# Patient Record
Sex: Male | Born: 1960
Health system: Southern US, Community
[De-identification: ages and names within clinical notes are randomized; demographics above are authoritative.]

## PROBLEM LIST (undated history)

## (undated) DIAGNOSIS — I671 Cerebral aneurysm, nonruptured: Secondary | ICD-10-CM

## (undated) DIAGNOSIS — I639 Cerebral infarction, unspecified: Secondary | ICD-10-CM

## (undated) DIAGNOSIS — J42 Unspecified chronic bronchitis: Secondary | ICD-10-CM

## (undated) DIAGNOSIS — J439 Emphysema, unspecified: Secondary | ICD-10-CM

## (undated) DIAGNOSIS — I10 Essential (primary) hypertension: Secondary | ICD-10-CM

## (undated) DIAGNOSIS — J45909 Unspecified asthma, uncomplicated: Secondary | ICD-10-CM

## (undated) DIAGNOSIS — F4321 Adjustment disorder with depressed mood: Secondary | ICD-10-CM

## (undated) DIAGNOSIS — B159 Hepatitis A without hepatic coma: Secondary | ICD-10-CM

## (undated) DIAGNOSIS — M069 Rheumatoid arthritis, unspecified: Secondary | ICD-10-CM

## (undated) DIAGNOSIS — K219 Gastro-esophageal reflux disease without esophagitis: Secondary | ICD-10-CM

## (undated) DIAGNOSIS — J189 Pneumonia, unspecified organism: Secondary | ICD-10-CM

## (undated) DIAGNOSIS — E7800 Pure hypercholesterolemia, unspecified: Secondary | ICD-10-CM

## (undated) DIAGNOSIS — J449 Chronic obstructive pulmonary disease, unspecified: Secondary | ICD-10-CM

## (undated) HISTORY — PX: APPENDECTOMY: SHX54

## (undated) HISTORY — PX: CYSTECTOMY: SUR359

## (undated) HISTORY — DX: Emphysema, unspecified: J43.9

## (undated) HISTORY — PX: CHEST TUBE INSERTION: SHX231

## (undated) HISTORY — PX: FOOT FRACTURE SURGERY: SHX645

---

## 2004-05-01 ENCOUNTER — Encounter: Admission: RE | Admit: 2004-05-01 | Discharge: 2004-05-01 | Payer: Self-pay | Admitting: Orthopedic Surgery

## 2004-05-10 ENCOUNTER — Ambulatory Visit (HOSPITAL_BASED_OUTPATIENT_CLINIC_OR_DEPARTMENT_OTHER): Admission: RE | Admit: 2004-05-10 | Discharge: 2004-05-10 | Payer: Self-pay | Admitting: Orthopedic Surgery

## 2004-05-10 ENCOUNTER — Ambulatory Visit (HOSPITAL_COMMUNITY): Admission: RE | Admit: 2004-05-10 | Discharge: 2004-05-10 | Payer: Self-pay | Admitting: Orthopedic Surgery

## 2009-08-05 DIAGNOSIS — F4321 Adjustment disorder with depressed mood: Secondary | ICD-10-CM

## 2009-08-05 HISTORY — DX: Adjustment disorder with depressed mood: F43.21

## 2013-12-03 ENCOUNTER — Emergency Department (HOSPITAL_BASED_OUTPATIENT_CLINIC_OR_DEPARTMENT_OTHER): Payer: Self-pay

## 2013-12-03 ENCOUNTER — Encounter (HOSPITAL_BASED_OUTPATIENT_CLINIC_OR_DEPARTMENT_OTHER): Payer: Self-pay | Admitting: Emergency Medicine

## 2013-12-03 ENCOUNTER — Inpatient Hospital Stay (HOSPITAL_COMMUNITY): Payer: Self-pay

## 2013-12-03 ENCOUNTER — Inpatient Hospital Stay (HOSPITAL_BASED_OUTPATIENT_CLINIC_OR_DEPARTMENT_OTHER)
Admission: EM | Admit: 2013-12-03 | Discharge: 2013-12-08 | DRG: 191 | Disposition: A | Discharge status: Home / Self Care | Payer: Self-pay | Attending: Internal Medicine | Admitting: Internal Medicine

## 2013-12-03 DIAGNOSIS — J209 Acute bronchitis, unspecified: Principal | ICD-10-CM | POA: Diagnosis present

## 2013-12-03 DIAGNOSIS — J449 Chronic obstructive pulmonary disease, unspecified: Secondary | ICD-10-CM

## 2013-12-03 DIAGNOSIS — J440 Chronic obstructive pulmonary disease with acute lower respiratory infection: Principal | ICD-10-CM | POA: Diagnosis present

## 2013-12-03 DIAGNOSIS — R042 Hemoptysis: Secondary | ICD-10-CM | POA: Diagnosis present

## 2013-12-03 DIAGNOSIS — R911 Solitary pulmonary nodule: Secondary | ICD-10-CM | POA: Diagnosis present

## 2013-12-03 DIAGNOSIS — F17200 Nicotine dependence, unspecified, uncomplicated: Secondary | ICD-10-CM | POA: Diagnosis present

## 2013-12-03 DIAGNOSIS — I10 Essential (primary) hypertension: Secondary | ICD-10-CM | POA: Diagnosis present

## 2013-12-03 DIAGNOSIS — F1010 Alcohol abuse, uncomplicated: Secondary | ICD-10-CM | POA: Diagnosis present

## 2013-12-03 HISTORY — DX: Unspecified asthma, uncomplicated: J45.909

## 2013-12-03 HISTORY — DX: Chronic obstructive pulmonary disease, unspecified: J44.9

## 2013-12-03 HISTORY — DX: Emphysema, unspecified: J43.9

## 2013-12-03 HISTORY — DX: Rheumatoid arthritis, unspecified: M06.9

## 2013-12-03 HISTORY — DX: Essential (primary) hypertension: I10

## 2013-12-03 LAB — HEPATIC FUNCTION PANEL
ALBUMIN: 3.8 g/dL (ref 3.5–5.2)
ALK PHOS: 107 U/L (ref 39–117)
ALT: 15 U/L (ref 0–53)
AST: 20 U/L (ref 0–37)
Bilirubin, Direct: <0.2 mg/dL (ref 0.0–0.3)
TOTAL PROTEIN: 7.4 g/dL (ref 6.0–8.3)
Total Bilirubin: 0.2 mg/dL — ABNORMAL LOW (ref 0.3–1.2)

## 2013-12-03 LAB — BASIC METABOLIC PANEL
BUN: 13 mg/dL (ref 6–23)
CHLORIDE: 104 meq/L (ref 96–112)
CO2: 26 mEq/L (ref 19–32)
Calcium: 9.7 mg/dL (ref 8.4–10.5)
Creatinine, Ser: 1.2 mg/dL (ref 0.50–1.35)
GFR calc non Af Amer: 67 mL/min — ABNORMAL LOW (ref >90)
GFR, EST AFRICAN AMERICAN: 78 mL/min — AB (ref >90)
Glucose, Bld: 113 mg/dL — ABNORMAL HIGH (ref 70–99)
POTASSIUM: 4.6 meq/L (ref 3.7–5.3)
Sodium: 141 mEq/L (ref 137–147)

## 2013-12-03 LAB — CBC WITH DIFFERENTIAL/PLATELET
BASOS PCT: 1 % (ref 0–1)
Basophils Absolute: 0.1 10*3/uL (ref 0.0–0.1)
EOS ABS: 0.3 10*3/uL (ref 0.0–0.7)
EOS PCT: 3 % (ref 0–5)
HEMATOCRIT: 43.4 % (ref 39.0–52.0)
HEMOGLOBIN: 14.5 g/dL (ref 13.0–17.0)
LYMPHS ABS: 2.8 10*3/uL (ref 0.7–4.0)
Lymphocytes Relative: 26 % (ref 12–46)
MCH: 29.8 pg (ref 26.0–34.0)
MCHC: 33.4 g/dL (ref 30.0–36.0)
MCV: 89.1 fL (ref 78.0–100.0)
MONO ABS: 1.1 10*3/uL — AB (ref 0.1–1.0)
MONOS PCT: 10 % (ref 3–12)
NEUTROS ABS: 6.8 10*3/uL (ref 1.7–7.7)
Neutrophils Relative %: 62 % (ref 43–77)
PLATELETS: 318 10*3/uL (ref 150–400)
RBC: 4.87 MIL/uL (ref 4.22–5.81)
RDW: 13.5 % (ref 11.5–15.5)
WBC: 11 10*3/uL — ABNORMAL HIGH (ref 4.0–10.5)

## 2013-12-03 LAB — PROTIME-INR
INR: 0.92 (ref 0.00–1.49)
Prothrombin Time: 12.2 seconds (ref 11.6–15.2)

## 2013-12-03 LAB — APTT: APTT: 30 s (ref 24–37)

## 2013-12-03 MED ORDER — LORAZEPAM 1 MG PO TABS: 0.0000 mg | ORAL_TABLET | Freq: Four times a day (QID) | ORAL | Status: AC

## 2013-12-03 MED ORDER — SODIUM CHLORIDE 0.9 % IJ SOLN
3.0000 mL | Freq: Two times a day (BID) | INTRAMUSCULAR | Status: DC
  Administered 2013-12-04 – 2013-12-07 (×8): 3 mL via INTRAVENOUS

## 2013-12-03 MED ORDER — IPRATROPIUM-ALBUTEROL 0.5-2.5 (3) MG/3ML IN SOLN: 3.0000 mL | RESPIRATORY_TRACT | Status: DC | PRN

## 2013-12-03 MED ORDER — ALBUTEROL SULFATE (2.5 MG/3ML) 0.083% IN NEBU
2.5000 mg | INHALATION_SOLUTION | Freq: Once | RESPIRATORY_TRACT | Status: DC
  Administered 2013-12-03: 5 mg via RESPIRATORY_TRACT

## 2013-12-03 MED ORDER — FOLIC ACID 1 MG PO TABS
1.0000 mg | ORAL_TABLET | Freq: Every day | ORAL | Status: DC
  Administered 2013-12-03 – 2013-12-08 (×6): 1 mg via ORAL
  Filled 2013-12-03 (×6): qty 1

## 2013-12-03 MED ORDER — METHYLPREDNISOLONE SODIUM SUCC 125 MG IJ SOLR
125.0000 mg | Freq: Once | INTRAMUSCULAR | Status: AC
  Administered 2013-12-03: 125 mg via INTRAVENOUS
  Filled 2013-12-03: qty 2

## 2013-12-03 MED ORDER — AMLODIPINE BESYLATE 5 MG PO TABS
5.0000 mg | ORAL_TABLET | Freq: Every day | ORAL | Status: DC
  Administered 2013-12-03: 5 mg via ORAL
  Filled 2013-12-03 (×2): qty 1

## 2013-12-03 MED ORDER — IPRATROPIUM BROMIDE 0.02 % IN SOLN
RESPIRATORY_TRACT | Status: AC
  Administered 2013-12-03: 0.5 mg via RESPIRATORY_TRACT
  Filled 2013-12-03: qty 2.5

## 2013-12-03 MED ORDER — ALBUTEROL SULFATE (2.5 MG/3ML) 0.083% IN NEBU
5.0000 mg | INHALATION_SOLUTION | RESPIRATORY_TRACT | Status: AC | PRN
Start: 2013-12-03 — End: 2013-12-03
  Administered 2013-12-03: 5 mg via RESPIRATORY_TRACT
  Filled 2013-12-03: qty 6

## 2013-12-03 MED ORDER — VITAMIN B-1 100 MG PO TABS
100.0000 mg | ORAL_TABLET | Freq: Every day | ORAL | Status: DC
Start: 2013-12-03 — End: 2013-12-08
  Administered 2013-12-03 – 2013-12-08 (×6): 100 mg via ORAL
  Filled 2013-12-03 (×6): qty 1

## 2013-12-03 MED ORDER — MORPHINE SULFATE 2 MG/ML IJ SOLN: 2.0000 mg | INTRAMUSCULAR | Status: DC | PRN

## 2013-12-03 MED ORDER — DEXTROSE 5 % IV SOLN
1.0000 g | Freq: Once | INTRAVENOUS | Status: AC
  Administered 2013-12-03: 1 g via INTRAVENOUS

## 2013-12-03 MED ORDER — LORAZEPAM 1 MG PO TABS: 1.0000 mg | ORAL_TABLET | Freq: Four times a day (QID) | ORAL | Status: AC | PRN

## 2013-12-03 MED ORDER — LORAZEPAM 2 MG/ML IJ SOLN: 1.0000 mg | Freq: Four times a day (QID) | INTRAMUSCULAR | Status: AC | PRN

## 2013-12-03 MED ORDER — IOHEXOL 300 MG/ML  SOLN
100.0000 mL | Freq: Once | INTRAMUSCULAR | Status: AC | PRN
  Administered 2013-12-03: 100 mL via INTRAVENOUS

## 2013-12-03 MED ORDER — CEFTRIAXONE SODIUM 1 G IJ SOLR
INTRAMUSCULAR | Status: AC
  Filled 2013-12-03: qty 10

## 2013-12-03 MED ORDER — SODIUM CHLORIDE 0.9 % IV SOLN
INTRAVENOUS | Status: DC
  Administered 2013-12-03: 1000 mL via INTRAVENOUS

## 2013-12-03 MED ORDER — HYDRALAZINE HCL 20 MG/ML IJ SOLN
10.0000 mg | INTRAMUSCULAR | Status: DC | PRN
Start: 2013-12-03 — End: 2013-12-08
  Administered 2013-12-05 (×2): 10 mg via INTRAVENOUS
  Filled 2013-12-03 (×2): qty 1

## 2013-12-03 MED ORDER — IPRATROPIUM-ALBUTEROL 0.5-2.5 (3) MG/3ML IN SOLN: 3.0000 mL | Freq: Once | RESPIRATORY_TRACT | Status: DC

## 2013-12-03 MED ORDER — ADULT MULTIVITAMIN W/MINERALS CH
1.0000 | ORAL_TABLET | Freq: Every day | ORAL | Status: DC
  Administered 2013-12-03 – 2013-12-08 (×6): 1 via ORAL
  Filled 2013-12-03 (×6): qty 1

## 2013-12-03 MED ORDER — DEXTROSE 5 % IV SOLN
500.0000 mg | Freq: Once | INTRAVENOUS | Status: AC
  Administered 2013-12-03: 500 mg via INTRAVENOUS

## 2013-12-03 MED ORDER — LORAZEPAM 1 MG PO TABS
0.0000 mg | ORAL_TABLET | Freq: Two times a day (BID) | ORAL | Status: AC
  Filled 2013-12-03: qty 1

## 2013-12-03 MED ORDER — THIAMINE HCL 100 MG/ML IJ SOLN
100.0000 mg | Freq: Every day | INTRAMUSCULAR | Status: DC
  Filled 2013-12-03 (×5): qty 1

## 2013-12-03 MED ORDER — ALBUTEROL SULFATE (2.5 MG/3ML) 0.083% IN NEBU
INHALATION_SOLUTION | RESPIRATORY_TRACT | Status: AC
  Administered 2013-12-03: 5 mg via RESPIRATORY_TRACT
  Filled 2013-12-03: qty 6

## 2013-12-03 NOTE — ED Notes (Signed)
Coughing up blood x 3 days  Denies pain

## 2013-12-03 NOTE — H&P (Signed)
[facility] History and Physical  Derek Conner QVZ:[id number] DOB: 12/18/1960 DOA: 12/03/2013  Referring physician: Dr. Read Drivers PCP: Rosario Adie, MD  Specialists:   Chief Complaint: Hemoptysis  HPI: Derek Conner is a 53 y.o. male  With a hx of htn, active tobacco abuse with over 40pack/yr hx smoking, active ETOH abuse who presents to the ED with gross hemoptysis since 11/30/13. Reports up to two cups of gross blood per episode. In ED, hgb was noted to be 14 with plt of 318. CXR was pos for emphysema/copd. Pt was transferred to [facility] for further evaluation. Pt denies CP or sob  Review of Systems:  Per above, the remainder of the 10pt ros reviewed and are neg  Past Medical History  Diagnosis Date  . COPD (chronic obstructive pulmonary disease)   . Emphysema lung   . Hypertension    History reviewed. No pertinent past surgical history. Social History:  reports that he has been smoking.  He does not have any smokeless tobacco history on file. He reports that he drinks alcohol. He reports that he uses illicit drugs.  where does patient live--home, ALF, SNF? and with whom if at home?  Can patient participate in ADLs?  No Known Allergies  No family history on file.  (be sure to complete)  Prior to Admission medications   Medication Sig Start Date End Date Taking? Authorizing Provider  albuterol (PROVENTIL HFA;VENTOLIN HFA) 108 (90 BASE) MCG/ACT inhaler Inhale 1 puff into the lungs every 6 (six) hours as needed for wheezing or shortness of breath.   Yes Historical Provider, MD   Physical Exam: Filed Vitals:   12/03/13 0754 12/03/13 0757 12/03/13 0800 12/03/13 0922  BP:   187/98 170/92  Pulse:  105  97  Temp:   98.1 F (36.7 C) 98 F (36.7 C)  TempSrc:   Oral Oral  Resp:  14  18  Height:    5\' 11"  (1.803 m)  Weight:    81.6 kg (179 lb 14.3 oz)  SpO2: 97% 97%  100%     General:  Awake, in nad  Eyes: PERRL B  ENT: membranes moist, dentition fair  Neck:  trachea midline, neck supple  Cardiovascular: regular, s1, s2  Respiratory: normal resp effort, decreased BS throughout  Abdomen: soft, nondistended  Skin: normal skin turgor, no abnormal skin lesions seen  Musculoskeletal: perfused, no clubbing  Psychiatric: mood/affect normal// no auditory/visual hallucinations  Neurologic: cn2-12 grossly intact, strength/sensation intact  Labs on Admission:  Basic Metabolic Panel:  Recent Labs Lab 12/03/13 0619  NA 141  K 4.6  CL 104  CO2 26  GLUCOSE 113*  BUN 13  CREATININE 1.20  CALCIUM 9.7   Liver Function Tests:  Recent Labs Lab 12/03/13 0619  AST 20  ALT 15  ALKPHOS 107  BILITOT 0.2*  PROT 7.4  ALBUMIN 3.8   No results found for this basename: LIPASE, AMYLASE,  in the last 168 hours No results found for this basename: AMMONIA,  in the last 168 hours CBC:  Recent Labs Lab 12/03/13 0619  WBC 11.0*  NEUTROABS 6.8  HGB 14.5  HCT 43.4  MCV 89.1  PLT 318   Cardiac Enzymes: No results found for this basename: CKTOTAL, CKMB, CKMBINDEX, TROPONINI,  in the last 168 hours  BNP (last 3 results) No results found for this basename: PROBNP,  in the last 8760 hours CBG: No results found for this basename: GLUCAP,  in the last 168 hours  Radiological Exams on  Admission: Dg Chest 2 View  12/03/2013   CLINICAL DATA:  Coughing up blood for 3 days.  Hemoptysis.  COPD.  EXAM: CHEST  2 VIEW  COMPARISON:  None.  FINDINGS: Hyperinflation is present compatible with emphysema. Bilateral pleural apical scarring. There is no airspace disease or pleural effusion. Cardiopericardial silhouette and mediastinal contours are within normal limits. Partially visualized cervical spondylosis and probable right carotid atherosclerosis.  IMPRESSION: Emphysema without acute cardiopulmonary disease.   Electronically Signed   By: Andreas Newport M.D.   On: 12/03/2013 07:06    Assessment/Plan Active Problems:   Hemoptysis   1. Hemoptysis 1. Hgb  stable 2. Will order ct chest w/ contrast 3. Will consult pulm for further recs 4. Admit to med/tele 2. HTN 1. BP elevated 2. Avoid beta blocker for now given hx of emphysema and decreased bs currently 3. Reports previously being on ACEI, but dose was decreased secondary to abnormal lab value? 4. Will start low dose Ca channel blocker for now 5. PRN hydralazine ordered 3. COPD 1. PRN duonebs 2. No wheezing currently 4. DVT prophylaxis 1. scds 5. Tobacco abuse 1. cessation done face to face 2. Does not want nicotine patch now 6. ETOH abuse 1. Drinks 6-8 beers daily 2. Last intake was night prior to admit 3. CIWA protocol ordered  Code Status: Full (must indicate code status--if unknown or must be presumed, indicate so) Family Communication: Pt in room (indicate person spoken with, if applicable, with phone number if by telephone) Disposition Plan: Pending (indicate anticipated LOS)  Time spent:  Jerald Kief [facility] Pager [phone]  If 7PM-7AM, please contact night-coverage [email or web address] Password [email or web address] 12/03/2013, 12:09 PM

## 2013-12-03 NOTE — ED Notes (Signed)
Arrived ems w c/o coughing up blood  Onset 3 days ago getting worse

## 2013-12-03 NOTE — Progress Notes (Signed)
12/03/2013 1000 Nursing note Dr. Robb Matar text paged and made aware of pt. Arrival to floor.  Derek Conner

## 2013-12-03 NOTE — ED Notes (Signed)
Report to [facility] medics, pt remains a/a, denies any c/o or needs. Pt updated on plan of care, pending transport and admit inpatient to 2 west room 9.

## 2013-12-03 NOTE — ED Provider Notes (Addendum)
CSN: [id number]     Arrival date & time 12/03/13  0612 History   None    Chief Complaint  Patient presents with  . Hemoptysis     (Consider location/radiation/quality/duration/timing/severity/associated sxs/prior Treatment) HPI This is a 53 year old male with a history of chronic obstructive pulmonary disease. He has a long-standing history of chronic cough. He is here with a three-day history of hemoptysis. This began as blood-streaked sputum for 2 days. The quantity of blood subsequently divorced and. Over the past several hours it has acutely worsened and he was coughing so heavily he was having difficulty breathing. He states he is now coughing up pure blood. His cough is not as urgent at the present moment as it was earlier. He has been using his inhalers regularly with partial relief of dyspnea. He denies fever or chills. He denies chest pain. He denies nausea, vomiting or diarrhea. He is not on any blood thinners. He has not been to a doctor in over a year. He is supposed to be taking lisinopril but has been noncompliant.  Past Medical History  Diagnosis Date  . COPD (chronic obstructive pulmonary disease)   . Emphysema lung   . Hypertension    History reviewed. No pertinent past surgical history. No family history on file. History  Substance Use Topics  . Smoking status: Current Every Day Smoker  . Smokeless tobacco: Not on file  . Alcohol Use: Yes    Review of Systems  All other systems reviewed and are negative.   Allergies  Review of patient's allergies indicates no known allergies.  Home Medications   Prior to Admission medications   Not on File   BP 176/108  Pulse 116  Temp(Src) 98.1 F (36.7 C) (Oral)  Resp 22  Ht 5\' 11"  (1.803 m)  Wt 165 lb (74.844 kg)  BMI 23.02 kg/m2  SpO2 99%  Physical Exam General: Well-developed, well-nourished male in no acute distress; appearance consistent with age of record HENT: normocephalic; atraumatic; normal appearing  oropharynx; rattle in throat when breathing Eyes: pupils equal, round and reactive to light; extraocular muscles intact Neck: supple Heart: regular rate and rhythm; tachycardic Lungs: Rales and wheezes diffusely but diminished in the left lower lobe; frequent cough, sputum is grossly bloody Abdomen: soft; nondistended; nontender; no masses or hepatosplenomegaly; bowel sounds present Extremities: No deformity; full range of motion; pulses normal Neurologic: Awake, alert and oriented; motor function intact in all extremities and symmetric; no facial droop Skin: Warm and dry Psychiatric: Normal mood and affect    ED Course  Procedures (including critical care time)   MDM   Nursing notes and vitals signs, including pulse oximetry, reviewed.  Summary of this visit's results, reviewed by myself:  Labs:  Results for orders placed during the hospital encounter of 12/03/13 (from the past 24 hour(s))  CBC WITH DIFFERENTIAL     Status: Abnormal   Collection Time    12/03/13  6:19 AM      Result Value Ref Range   WBC 11.0 (*) 4.0 - 10.5 K/uL   RBC 4.87  4.22 - 5.81 MIL/uL   Hemoglobin 14.5  13.0 - 17.0 g/dL   HCT 02/02/14  41.3 - 24.4 %   MCV 89.1  78.0 - 100.0 fL   MCH 29.8  26.0 - 34.0 pg   MCHC 33.4  30.0 - 36.0 g/dL   RDW 01.0  27.2 - 53.6 %   Platelets 318  150 - 400 K/uL   Neutrophils Relative %  62  43 - 77 %   Neutro Abs 6.8  1.7 - 7.7 K/uL   Lymphocytes Relative 26  12 - 46 %   Lymphs Abs 2.8  0.7 - 4.0 K/uL   Monocytes Relative 10  3 - 12 %   Monocytes Absolute 1.1 (*) 0.1 - 1.0 K/uL   Eosinophils Relative 3  0 - 5 %   Eosinophils Absolute 0.3  0.0 - 0.7 K/uL   Basophils Relative 1  0 - 1 %   Basophils Absolute 0.1  0.0 - 0.1 K/uL  BASIC METABOLIC PANEL     Status: Abnormal   Collection Time    12/03/13  6:19 AM      Result Value Ref Range   Sodium 141  137 - 147 mEq/L   Potassium 4.6  3.7 - 5.3 mEq/L   Chloride 104  96 - 112 mEq/L   CO2 26  19 - 32 mEq/L   Glucose,  Bld 113 (*) 70 - 99 mg/dL   BUN 13  6 - 23 mg/dL   Creatinine, Ser 5.63  0.50 - 1.35 mg/dL   Calcium 9.7  8.4 - 14.9 mg/dL   GFR calc non Af Amer 67 (*) >90 mL/min   GFR calc Af Amer 78 (*) >90 mL/min  PROTIME-INR     Status: None   Collection Time    12/03/13  6:19 AM      Result Value Ref Range   Prothrombin Time 12.2  11.6 - 15.2 seconds   INR 0.92  0.00 - 1.49  APTT     Status: None   Collection Time    12/03/13  6:19 AM      Result Value Ref Range   aPTT 30  24 - 37 seconds  HEPATIC FUNCTION PANEL     Status: Abnormal   Collection Time    12/03/13  6:19 AM      Result Value Ref Range   Total Protein 7.4  6.0 - 8.3 g/dL   Albumin 3.8  3.5 - 5.2 g/dL   AST 20  0 - 37 U/L   ALT 15  0 - 53 U/L   Alkaline Phosphatase 107  39 - 117 U/L   Total Bilirubin 0.2 (*) 0.3 - 1.2 mg/dL   Bilirubin, Direct <7.0  0.0 - 0.3 mg/dL   Indirect Bilirubin NOT CALCULATED  0.3 - 0.9 mg/dL    Imaging Studies: Dg Chest 2 View  12/03/2013   CLINICAL DATA:  Coughing up blood for 3 days.  Hemoptysis.  COPD.  EXAM: CHEST  2 VIEW  COMPARISON:  None.  FINDINGS: Hyperinflation is present compatible with emphysema. Bilateral pleural apical scarring. There is no airspace disease or pleural effusion. Cardiopericardial silhouette and mediastinal contours are within normal limits. Partially visualized cervical spondylosis and probable right carotid atherosclerosis.  IMPRESSION: Emphysema without acute cardiopulmonary disease.   Electronically Signed   By: Andreas Newport M.D.   On: 12/03/2013 07:06   7:17 AM Steroids, antibiotics started after discussion with accepting hospitalist.     Hanley Seamen, MD 12/03/13 2637  Hanley Seamen, MD 12/03/13 [phone]

## 2013-12-03 NOTE — Consult Note (Signed)
PULMONARY / CRITICAL CARE MEDICINE Name: Derek Conner MRN: [id number] DOB: 10/31/1960    ADMISSION DATE:  12/03/2013 CONSULTATION DATE:  12/03/2013  REFERRING MD :  Rhona Leavens PRIMARY SERVICE:  TRH  CHIEF COMPLAINT:  Hemoptysis   BRIEF PATIENT DESCRIPTION: 75 active smoker with over 40 pack-year history who presented on  to the ED with gross hemoptysis since 4/28. Reports up to two cups of blood per episode. PCCM asked to see for hemoptysis   SIGNIFICANT EVENTS / STUDIES:  5/1  Chest CT >>>  LINES / TUBES:  CULTURES:  ANTIBIOTICS: Rocephin 5/1 >>>  HISTORY OF PRESENT ILLNESS:   53 y.o. male With a hx of htn, active tobacco abuse with over 40pack/yr hx smoking, active ETOH abuse who presented to the ED on 5/1 with gross hemoptysis since 11/30/13. Reports up to two cups of gross blood per episode. In ED, hgb was noted to be 14 with plt of 318. CXR was pos for emphysema/copd. Pt was transferred to [facility] for further evaluation. Pt denies CP or sob. PCCM asked to see in consult.   PAST MEDICAL HISTORY :  Past Medical History  Diagnosis Date  . COPD (chronic obstructive pulmonary disease)   . Emphysema lung   . Hypertension    History reviewed. No pertinent past surgical history. Prior to Admission medications   Medication Sig Start Date End Date Taking? Authorizing Provider  albuterol (PROVENTIL HFA;VENTOLIN HFA) 108 (90 BASE) MCG/ACT inhaler Inhale 1 puff into the lungs every 6 (six) hours as needed for wheezing or shortness of breath.   Yes Historical Provider, MD   No Known Allergies  FAMILY HISTORY:  No family history on file.  SOCIAL HISTORY:  reports that he has been smoking.  He does not have any smokeless tobacco history on file. He reports that he drinks alcohol. He reports that he uses illicit drugs.  REVIEW OF SYSTEMS:   Constitutional: Negative for fever, chills, weight loss, malaise/fatigue and diaphoresis.  HENT: Negative for hearing loss, ear pain, nosebleeds,  congestion, sore throat, neck pain, tinnitus and ear discharge.   Eyes: Negative for blurred vision, double vision, photophobia, pain, discharge and redness.  Respiratory: Negative for cough, hemoptysis, started 4 d ago, intermittent blood tinged. On am of admit awoke abruptly w/ chest tightness/ Coughed up in his estimate 2 cups red/bloody frothy sputum, had chest tightness. Some improvement w/ SABA.Cardiovascular: Negative for chest pain, palpitations, orthopnea, claudication, leg swelling and PND.  Gastrointestinal: Negative for heartburn, nausea, vomiting, abdominal pain, diarrhea, constipation, blood in stool and melena.  Genitourinary: Negative for dysuria, urgency, frequency, hematuria and flank pain.  Musculoskeletal: Negative for myalgias, back pain, joint pain and falls.  Skin: Negative for itching and rash.  Neurological: Negative for dizziness, tingling, tremors, sensory change, speech change, focal weakness, seizures, loss of consciousness, weakness and headaches.  Endo/Heme/Allergies: Negative for environmental allergies and polydipsia. Does not bruise/bleed easily.  INTERVAL HISTORY:   VITAL SIGNS: Temp:  [98 F (36.7 C)-98.1 F (36.7 C)] 98 F (36.7 C) (05/01 0922) Pulse Rate:  [97-116] 97 (05/01 0922) Resp:  [14-22] 18 (05/01 0922) BP: (170-187)/(92-108) 170/92 mmHg (05/01 0922) SpO2:  [97 %-100 %] 100 % (05/01 0922) Weight:  [74.844 kg (165 lb)-81.6 kg (179 lb 14.3 oz)] 81.6 kg (179 lb 14.3 oz) (05/01 4034)  PHYSICAL EXAMINATION: General:  53 year old male, well nourished and in no acute distress Neuro:  Awake, oriented, no focal def  HEENT:  [place] Cardiovascular:  rrr Lungs:  Scattered rhonchi  Abdomen:  Soft, non-tender  Musculoskeletal:  Intact  Skin:  Intact   LABS:  CBC  Recent Labs Lab 12/03/13 0619  WBC 11.0*  HGB 14.5  HCT 43.4  PLT 318   Coag's  Recent Labs Lab 12/03/13 0619  APTT 30  INR 0.92   BMET  Recent Labs Lab 12/03/13 0619  NA  141  K 4.6  CL 104  CO2 26  BUN 13  CREATININE 1.20  GLUCOSE 113*   Electrolytes  Recent Labs Lab 12/03/13 0619  CALCIUM 9.7   Sepsis Markers No results found for this basename: LATICACIDVEN, PROCALCITON, O2SATVEN,  in the last 168 hours ABG No results found for this basename: PHART, PCO2ART, PO2ART,  in the last 168 hours Liver Enzymes  Recent Labs Lab 12/03/13 0619  AST 20  ALT 15  ALKPHOS 107  BILITOT 0.2*  ALBUMIN 3.8   Cardiac Enzymes No results found for this basename: TROPONINI, PROBNP,  in the last 168 hours Glucose No results found for this basename: GLUCAP,  in the last 168 hours  IMAGING:  Dg Chest 2 View  12/03/2013   CLINICAL DATA:  Coughing up blood for 3 days.  Hemoptysis.  COPD.  EXAM: CHEST  2 VIEW  COMPARISON:  None.  FINDINGS: Hyperinflation is present compatible with emphysema. Bilateral pleural apical scarring. There is no airspace disease or pleural effusion. Cardiopericardial silhouette and mediastinal contours are within normal limits. Partially visualized cervical spondylosis and probable right carotid atherosclerosis.  IMPRESSION: Emphysema without acute cardiopulmonary disease.   Electronically Signed   By: Andreas Newport M.D.   On: 12/03/2013 07:06   ASSESSMENT / PLAN:  Hemoptysis  Acute bronchitis Doubt pneumonia COPD by history, but no evidence of bronchospasm on examination Tobacco use disorder   Chest CT  May need FOB  Supplemental oxygen PRN  Bronchodilators PRN  No indications for systemic steroids  Agree with antibiotics, may simplify to monotherapy with Levaquin  Smoking cessation advised  May need Nicotine  patch  Consult appreciated, will follow  I have personally obtained history, examined patient, evaluated and interpreted laboratory and imaging results, reviewed medical records, formulated assessment / plan and placed orders.  Lonia Farber, MD Pulmonary and Critical Care Medicine [facility] Pager: [phone]  12/03/2013, 2:52 PM

## 2013-12-04 ENCOUNTER — Encounter (HOSPITAL_COMMUNITY): Payer: Self-pay | Admitting: General Practice

## 2013-12-04 LAB — COMPREHENSIVE METABOLIC PANEL
ALBUMIN: 3.2 g/dL — AB (ref 3.5–5.2)
ALT: 11 U/L (ref 0–53)
AST: 16 U/L (ref 0–37)
Alkaline Phosphatase: 66 U/L (ref 39–117)
BUN: 17 mg/dL (ref 6–23)
CALCIUM: 9.1 mg/dL (ref 8.4–10.5)
CO2: 22 mEq/L (ref 19–32)
CREATININE: 1.16 mg/dL (ref 0.50–1.35)
Chloride: 104 mEq/L (ref 96–112)
GFR calc non Af Amer: 70 mL/min — ABNORMAL LOW (ref >90)
GFR, EST AFRICAN AMERICAN: 81 mL/min — AB (ref >90)
GLUCOSE: 108 mg/dL — AB (ref 70–99)
Potassium: 4.6 mEq/L (ref 3.7–5.3)
Sodium: 141 mEq/L (ref 137–147)
TOTAL PROTEIN: 6.4 g/dL (ref 6.0–8.3)
Total Bilirubin: 0.3 mg/dL (ref 0.3–1.2)

## 2013-12-04 LAB — CBC
HCT: 38.9 % — ABNORMAL LOW (ref 39.0–52.0)
Hemoglobin: 12.8 g/dL — ABNORMAL LOW (ref 13.0–17.0)
MCH: 29.2 pg (ref 26.0–34.0)
MCHC: 32.9 g/dL (ref 30.0–36.0)
MCV: 88.6 fL (ref 78.0–100.0)
Platelets: 312 10*3/uL (ref 150–400)
RBC: 4.39 MIL/uL (ref 4.22–5.81)
RDW: 13.6 % (ref 11.5–15.5)
WBC: 12 10*3/uL — AB (ref 4.0–10.5)

## 2013-12-04 MED ORDER — LEVOFLOXACIN IN D5W 750 MG/150ML IV SOLN
750.0000 mg | INTRAVENOUS | Status: DC
  Administered 2013-12-04 – 2013-12-06 (×3): 750 mg via INTRAVENOUS
  Filled 2013-12-04 (×4): qty 150

## 2013-12-04 MED ORDER — METHYLPREDNISOLONE SODIUM SUCC 40 MG IJ SOLR
40.0000 mg | Freq: Four times a day (QID) | INTRAMUSCULAR | Status: DC
  Filled 2013-12-04 (×4): qty 1

## 2013-12-04 MED ORDER — AMLODIPINE BESYLATE 10 MG PO TABS
10.0000 mg | ORAL_TABLET | Freq: Every day | ORAL | Status: DC
  Administered 2013-12-04 – 2013-12-08 (×5): 10 mg via ORAL
  Filled 2013-12-04 (×5): qty 1

## 2013-12-04 MED ORDER — IPRATROPIUM-ALBUTEROL 0.5-2.5 (3) MG/3ML IN SOLN
3.0000 mL | RESPIRATORY_TRACT | Status: DC
  Administered 2013-12-04 – 2013-12-05 (×6): 3 mL via RESPIRATORY_TRACT
  Filled 2013-12-04 (×6): qty 3

## 2013-12-04 NOTE — Progress Notes (Addendum)
[facility] PROGRESS NOTE  Sarah Zerby RUE:[id number] DOB: 04-05-1961 DOA: 12/03/2013 PCP: Rosario Adie, MD  Assessment/Plan: 1. Hemoptysis -CT scan of lungs with IV contrast performed 12/03/2013 show a nodular consolidation in the right upper lobe -Patient with 40-pack-year history of tobacco abuse -Pulmonary critical care medicine consulted -Started Levaquin 750 mg IV q daily  2. COPD -On this mornings evaluation patient complaining of increasing cough, chest tightness, wheezing, appearing to be in mild respiratory distress, improved after receiving DUONebs.  -On reassessment patient off of supplemental oxygen in no acute distress.  -Will continue scheduled DUONebs every 4 hours -Start Levaquin 750 mg IV every 24 hours  3. Hypertension -Patient having elevated blood pressures this morning. -Will increase Norvasc to 10 mg by mouth daily  4. DVT prophylaxis -SCDs  Code Status: Full code Family Communication:  Disposition Plan: Patient continuing to have proptosis, continue close monitoring on telemetry.    Consultants:  Pulmonary critical care medicine  Antibiotics:  Levaquin 750 mg IV every 24 hours (started on 12/14/2013)  HPI/Subjective: Patient is a pleasant 53 year old gentleman with a past medical history of tobacco abuse, admitted for gross hemoptysis since 11/30/2013. Patient was seen and evaluated by pulmonary critical care medicine. He had a CT scan with IV contrast on 12/03/2013 which showed nodular consolidation in the right upper lobe. This morning he complains of chest tightness, increasing shortness of breath and wheezing. He continues to have episodes of hemoptysis.  Objective: Filed Vitals:   12/04/13 0932  BP: 181/94  Pulse: 102  Temp:   Resp:     Intake/Output Summary (Last 24 hours) at 12/04/13 1017 Last data filed at 12/03/13 1230  Gross per 24 hour  Intake    360 ml  Output      0 ml  Net    360 ml   Filed Weights   12/03/13  0612 12/03/13 0922  Weight: 74.844 kg (165 lb) 81.6 kg (179 lb 14.3 oz)    Exam:   General:  Patient appeared in mild respiratory distress, awake alert  Cardiovascular: Regular rate and rhythm normal S1-S2  Respiratory: Diminished breath sounds bilaterally with bilateral expiratory wheezing  Abdomen: Soft nontender nondistended  Musculoskeletal: No edema  Data Reviewed: Basic Metabolic Panel:  Recent Labs Lab 12/03/13 0619 12/04/13 0403  NA 141 141  K 4.6 4.6  CL 104 104  CO2 26 22  GLUCOSE 113* 108*  BUN 13 17  CREATININE 1.20 1.16  CALCIUM 9.7 9.1   Liver Function Tests:  Recent Labs Lab 12/03/13 0619 12/04/13 0403  AST 20 16  ALT 15 11  ALKPHOS 107 66  BILITOT 0.2* 0.3  PROT 7.4 6.4  ALBUMIN 3.8 3.2*   No results found for this basename: LIPASE, AMYLASE,  in the last 168 hours No results found for this basename: AMMONIA,  in the last 168 hours CBC:  Recent Labs Lab 12/03/13 0619 12/04/13 0403  WBC 11.0* 12.0*  NEUTROABS 6.8  --   HGB 14.5 12.8*  HCT 43.4 38.9*  MCV 89.1 88.6  PLT 318 312   Cardiac Enzymes: No results found for this basename: CKTOTAL, CKMB, CKMBINDEX, TROPONINI,  in the last 168 hours BNP (last 3 results) No results found for this basename: PROBNP,  in the last 8760 hours CBG: No results found for this basename: GLUCAP,  in the last 168 hours  No results found for this or any previous visit (from the past 240 hour(s)).   Studies: Dg Chest 2 View  12/03/2013   CLINICAL DATA:  Coughing up blood for 3 days.  Hemoptysis.  COPD.  EXAM: CHEST  2 VIEW  COMPARISON:  None.  FINDINGS: Hyperinflation is present compatible with emphysema. Bilateral pleural apical scarring. There is no airspace disease or pleural effusion. Cardiopericardial silhouette and mediastinal contours are within normal limits. Partially visualized cervical spondylosis and probable right carotid atherosclerosis.  IMPRESSION: Emphysema without acute cardiopulmonary  disease.   Electronically Signed   By: Andreas Newport M.D.   On: 12/03/2013 07:06   Ct Chest W Contrast  12/03/2013   CLINICAL DATA:  Hemoptysis, tobacco abuse.  EXAM: CT CHEST WITH CONTRAST  TECHNIQUE: Multidetector CT imaging of the chest was performed during intravenous contrast administration.  CONTRAST:  OMNIPAQUE IOHEXOL 300 MG/ML  SOLN  COMPARISON:  None.  FINDINGS: Mediastinal lymph nodes are not enlarged by CT size criteria. No hilar or axillary adenopathy. Heart size normal. No pericardial effusion. Tiny hiatal hernia.  Centrilobular emphysema with bullous changes of the apices. Confluent nodular density in the right upper lobe measures 0.9 x 1.9 cm (image 16). Additional mild peribronchovascular nodularity and bronchial wall thickening in the right upper lobe. Linear scarring in the left upper lobe. Additional nodules measure up to 8 mm in the left upper lobe (image 34). No pleural fluid. Minimal debris is seen dependently in the lower trachea and within the right mainstem bronchus and bronchus intermedius.  Incidental imaging of the upper abdomen shows the visualized portions of the liver, gallbladder and adrenal glands to the grossly unremarkable. Low-attenuation lesions in the kidneys measure up to 10 mm on the right and are poorly evaluated in the absence of precontrast imaging and due to size. Visualized portions of the spleen, pancreas, stomach and bowel are otherwise grossly unremarkable. No upper abdominal adenopathy. No worrisome lytic or sclerotic lesions.  IMPRESSION: 1. Nodular consolidation in the right upper lobe. Followup CT chest without contrast in 3 months is recommended in further evaluation, as primary bronchogenic carcinoma can have this appearance. 2. Additional scattered small pulmonary nodules measure up to 8 mm in left upper lobe. Continued attention on followup exams is warranted.   Electronically Signed   By: Leanna Battles M.D.   On: 12/03/2013 16:33    Scheduled  Meds: . amLODipine  10 mg Oral Daily  . folic acid  1 mg Oral Daily  . ipratropium-albuterol  3 mL Nebulization Q4H  . LORazepam  0-4 mg Oral Q6H   Followed by  . [START ON 12/05/2013] LORazepam  0-4 mg Oral Q12H  . methylPREDNISolone (SOLU-MEDROL) injection  40 mg Intravenous 4 times per day  . multivitamin with minerals  1 tablet Oral Daily  . sodium chloride  3 mL Intravenous Q12H  . thiamine  100 mg Oral Daily   Or  . thiamine  100 mg Intravenous Daily   Continuous Infusions: . sodium chloride 1,000 mL (12/03/13 5400)    Active Problems:   Hemoptysis   COPD (chronic obstructive pulmonary disease)   Acute bronchitis   Tobacco use disorder    Time spent: 35 minutes    Jeralyn Bennett  [facility] Pager [phone]. If 7PM-7AM, please contact night-coverage at [email or web address], password [facility] 12/04/2013, 10:17 AM  LOS: 1 day

## 2013-12-04 NOTE — Progress Notes (Signed)
PULMONARY / CRITICAL CARE MEDICINE Name: Derek Conner MRN: [id number] DOB: [birth year]    ADMISSION DATE:  12/03/2013 CONSULTATION DATE:  12/03/2013  REFERRING MD :  Rhona Leavens PRIMARY SERVICE:  TRH  CHIEF COMPLAINT:  Hemoptysis   BRIEF PATIENT DESCRIPTION: 23 active smoker with over 40 pack-year history who presented on  to the ED with gross hemoptysis since 4/28. Reports up to two cups of blood per episode. PCCM asked to see for hemoptysis   SIGNIFICANT EVENTS / STUDIES:  5/1  Chest CT > Nodular consolidation in the right upper lobe. Followup CT chest  without contrast in 3 months is recommended in further evaluation,  as primary bronchogenic carcinoma can have this appearance   LINES / TUBES:  CULTURES:  ANTIBIOTICS: Rocephin 5/1 >>>     INTERVAL HISTORY/Subjective Much less bleeding x last 24 h No sob or cp  VITAL SIGNS: Temp:  [98.1 F (36.7 C)-98.8 F (37.1 C)] 98.8 F (37.1 C) (05/02 1336) Pulse Rate:  [93-110] 95 (05/02 1336) Resp:  [18-19] 18 (05/02 1336) BP: (158-181)/(88-105) 164/88 mmHg (05/02 1336) SpO2:  [97 %-99 %] 98 % (05/02 1336)  PHYSICAL EXAMINATION: General:  53 year old male, well nourished and in no acute distress Neuro:  Awake, oriented, no focal def  HEENT:  [place] Cardiovascular:  rrr Lungs:  Scattered rhonchi  Abdomen:  Soft, non-tender  Musculoskeletal:  Intact  Skin:  Intact   LABS:  CBC  Recent Labs Lab 12/03/13 0619 12/04/13 0403  WBC 11.0* 12.0*  HGB 14.5 12.8*  HCT 43.4 38.9*  PLT 318 312   Coag's  Recent Labs Lab 12/03/13 0619  APTT 30  INR 0.92   BMET  Recent Labs Lab 12/03/13 0619 12/04/13 0403  NA 141 141  K 4.6 4.6  CL 104 104  CO2 26 22  BUN 13 17  CREATININE 1.20 1.16  GLUCOSE 113* 108*   Electrolytes  Recent Labs Lab 12/03/13 0619 12/04/13 0403  CALCIUM 9.7 9.1   Sepsis Markers No results found for this basename: LATICACIDVEN, PROCALCITON, O2SATVEN,  in the last 168 hours ABG No results  found for this basename: PHART, PCO2ART, PO2ART,  in the last 168 hours Liver Enzymes  Recent Labs Lab 12/03/13 0619 12/04/13 0403  AST 20 16  ALT 15 11  ALKPHOS 107 66  BILITOT 0.2* 0.3  ALBUMIN 3.8 3.2*   Cardiac Enzymes No results found for this basename: TROPONINI, PROBNP,  in the last 168 hours Glucose No results found for this basename: GLUCAP,  in the last 168 hours  IMAGING:  Dg Chest 2 View  12/03/2013   CLINICAL DATA:  Coughing up blood for 3 days.  Hemoptysis.  COPD.  EXAM: CHEST  2 VIEW  COMPARISON:  None.  FINDINGS: Hyperinflation is present compatible with emphysema. Bilateral pleural apical scarring. There is no airspace disease or pleural effusion. Cardiopericardial silhouette and mediastinal contours are within normal limits. Partially visualized cervical spondylosis and probable right carotid atherosclerosis.  IMPRESSION: Emphysema without acute cardiopulmonary disease.   Electronically Signed   By: Andreas Newport M.D.   On: 12/03/2013 07:06   Ct Chest W Contrast  12/03/2013   CLINICAL DATA:  Hemoptysis, tobacco abuse.  EXAM: CT CHEST WITH CONTRAST  TECHNIQUE: Multidetector CT imaging of the chest was performed during intravenous contrast administration.  CONTRAST:  OMNIPAQUE IOHEXOL 300 MG/ML  SOLN  COMPARISON:  None.  FINDINGS: Mediastinal lymph nodes are not enlarged by CT size criteria. No hilar or axillary adenopathy.  Heart size normal. No pericardial effusion. Tiny hiatal hernia.  Centrilobular emphysema with bullous changes of the apices. Confluent nodular density in the right upper lobe measures 0.9 x 1.9 cm (image 16). Additional mild peribronchovascular nodularity and bronchial wall thickening in the right upper lobe. Linear scarring in the left upper lobe. Additional nodules measure up to 8 mm in the left upper lobe (image 34). No pleural fluid. Minimal debris is seen dependently in the lower trachea and within the right mainstem bronchus and bronchus  intermedius.  Incidental imaging of the upper abdomen shows the visualized portions of the liver, gallbladder and adrenal glands to the grossly unremarkable. Low-attenuation lesions in the kidneys measure up to 10 mm on the right and are poorly evaluated in the absence of precontrast imaging and due to size. Visualized portions of the spleen, pancreas, stomach and bowel are otherwise grossly unremarkable. No upper abdominal adenopathy. No worrisome lytic or sclerotic lesions.  IMPRESSION: 1. Nodular consolidation in the right upper lobe. Followup CT chest without contrast in 3 months is recommended in further evaluation, as primary bronchogenic carcinoma can have this appearance. 2. Additional scattered small pulmonary nodules measure up to 8 mm in left upper lobe. Continued attention on followup exams is warranted.   Electronically Signed   By: Leanna Battles M.D.   On: 12/03/2013 16:33   ASSESSMENT / PLAN:  Hemoptysis  Acute bronchitis  COPD by history, but no evidence of bronchospasm on examination Tobacco use disorder ? Atypica TB      May need FOB  Supplemental oxygen PRN  Bronchodilators PRN  No indications for systemic steroids  Agree with antibiotics> changed to levquin 5/2 due to concerns with MAI  Smoking cessation advised  May need Nicotine  patch     Sandrea Hughs, MD Pulmonary and Critical Care Medicine [facility] Cell [phone] After 5:30 PM or weekends, call [phone]

## 2013-12-04 NOTE — Plan of Care (Signed)
Problem: Consults Goal: General Medical Patient Education See Patient Education Module for specific education. Outcome: Progressing Hemoptysis

## 2013-12-04 NOTE — Progress Notes (Signed)
Spoke with dr.Zamara about AFB study ordered and and inquired that pt needed to be on airborne prec. Orders were given.

## 2013-12-05 LAB — CBC
HCT: 39.5 % (ref 39.0–52.0)
Hemoglobin: 12.9 g/dL — ABNORMAL LOW (ref 13.0–17.0)
MCH: 29.4 pg (ref 26.0–34.0)
MCHC: 32.7 g/dL (ref 30.0–36.0)
MCV: 90 fL (ref 78.0–100.0)
PLATELETS: 296 10*3/uL (ref 150–400)
RBC: 4.39 MIL/uL (ref 4.22–5.81)
RDW: 13.9 % (ref 11.5–15.5)
WBC: 8.9 10*3/uL (ref 4.0–10.5)

## 2013-12-05 LAB — BASIC METABOLIC PANEL
BUN: 17 mg/dL (ref 6–23)
CALCIUM: 9.1 mg/dL (ref 8.4–10.5)
CO2: 27 mEq/L (ref 19–32)
Chloride: 102 mEq/L (ref 96–112)
Creatinine, Ser: 1.26 mg/dL (ref 0.50–1.35)
GFR, EST AFRICAN AMERICAN: 74 mL/min — AB (ref >90)
GFR, EST NON AFRICAN AMERICAN: 64 mL/min — AB (ref >90)
Glucose, Bld: 104 mg/dL — ABNORMAL HIGH (ref 70–99)
Potassium: 3.8 mEq/L (ref 3.7–5.3)
Sodium: 141 mEq/L (ref 137–147)

## 2013-12-05 MED ORDER — ACETAMINOPHEN 325 MG PO TABS
650.0000 mg | ORAL_TABLET | Freq: Four times a day (QID) | ORAL | Status: DC | PRN
  Administered 2013-12-05: 650 mg via ORAL
  Filled 2013-12-05: qty 2

## 2013-12-05 MED ORDER — CLONIDINE HCL 0.1 MG PO TABS
0.1000 mg | ORAL_TABLET | Freq: Two times a day (BID) | ORAL | Status: DC
  Administered 2013-12-05 – 2013-12-08 (×6): 0.1 mg via ORAL
  Filled 2013-12-05 (×7): qty 1

## 2013-12-05 MED ORDER — IPRATROPIUM-ALBUTEROL 0.5-2.5 (3) MG/3ML IN SOLN
3.0000 mL | Freq: Three times a day (TID) | RESPIRATORY_TRACT | Status: DC
  Administered 2013-12-05 – 2013-12-06 (×3): 3 mL via RESPIRATORY_TRACT
  Filled 2013-12-05 (×4): qty 3

## 2013-12-05 NOTE — Progress Notes (Signed)
[facility] PROGRESS NOTE  Caeson Filippi TIW:[id number] DOB: 26-May-1961 DOA: 12/03/2013 PCP: Rosario Adie, MD  Assessment/Plan: 1. Hemoptysis -CT scan of lungs with IV contrast performed 12/03/2013 show a nodular consolidation in the right upper lobe -Patient with 40-pack-year history of tobacco abuse Levaquin 750 mg IV q daily For bronchoscopy tomorrow if AFB negative. Result collected and pending. Quantiferon gold pending  2. COPD Improved on bronchodilators.  3. Hypertension Continue to be elevated. Norvasc started during this admission. Add clonidine.  History of daily alcohol use: No evidence of DTs.   DVT prophylaxis -SCDs  Code Status: Full code Family Communication:  Disposition Plan: Patient continuing to have proptosis, continue close monitoring on telemetry.    Consultants:  Pulmonary critical care medicine  Antibiotics:  Levaquin 750 mg IV every 24 hours (started on 12/14/2013)  HPI/Subjective: Still with hemoptysis but not as much. Breathing much improved.  Objective: Filed Vitals:   12/05/13 1815  BP: 161/98  Pulse: 112  Temp: 98.6 F (37 C)  Resp: 16    Intake/Output Summary (Last 24 hours) at 12/05/13 1900 Last data filed at 12/05/13 1300  Gross per 24 hour  Intake    853 ml  Output      0 ml  Net    853 ml   Filed Weights   12/03/13 0612 12/03/13 0922 12/04/13 2027  Weight: 74.844 kg (165 lb) 81.6 kg (179 lb 14.3 oz) 80.9 kg (178 lb 5.6 oz)    Exam:   General:  Talkative. Occasional cough.  Cardiovascular: Regular rate and rhythm normal S1-S2  Respiratory: Diminished throughout with coarse breath sounds. No wheezing.  Abdomen: Soft nontender nondistended  Musculoskeletal: No edema  Data Reviewed: Basic Metabolic Panel:  Recent Labs Lab 12/03/13 0619 12/04/13 0403 12/05/13 0420  NA 141 141 141  K 4.6 4.6 3.8  CL 104 104 102  CO2 26 22 27   GLUCOSE 113* 108* 104*  BUN 13 17 17   CREATININE 1.20 1.16 1.26   CALCIUM 9.7 9.1 9.1   Liver Function Tests:  Recent Labs Lab 12/03/13 0619 12/04/13 0403  AST 20 16  ALT 15 11  ALKPHOS 107 66  BILITOT 0.2* 0.3  PROT 7.4 6.4  ALBUMIN 3.8 3.2*   No results found for this basename: LIPASE, AMYLASE,  in the last 168 hours No results found for this basename: AMMONIA,  in the last 168 hours CBC:  Recent Labs Lab 12/03/13 0619 12/04/13 0403 12/05/13 0420  WBC 11.0* 12.0* 8.9  NEUTROABS 6.8  --   --   HGB 14.5 12.8* 12.9*  HCT 43.4 38.9* 39.5  MCV 89.1 88.6 90.0  PLT 318 312 296   Cardiac Enzymes: No results found for this basename: CKTOTAL, CKMB, CKMBINDEX, TROPONINI,  in the last 168 hours BNP (last 3 results) No results found for this basename: PROBNP,  in the last 8760 hours CBG: No results found for this basename: GLUCAP,  in the last 168 hours  No results found for this or any previous visit (from the past 240 hour(s)).   Studies: No results found.  Scheduled Meds: . amLODipine  10 mg Oral Daily  . cloNIDine  0.1 mg Oral BID  . folic acid  1 mg Oral Daily  . ipratropium-albuterol  3 mL Nebulization TID  . levofloxacin (LEVAQUIN) IV  750 mg Intravenous Q24H  . LORazepam  0-4 mg Oral Q12H  . multivitamin with minerals  1 tablet Oral Daily  . sodium chloride  3 mL  Intravenous Q12H  . thiamine  100 mg Oral Daily   Or  . thiamine  100 mg Intravenous Daily   Continuous Infusions:    Active Problems:   Hemoptysis   COPD (chronic obstructive pulmonary disease)   Acute bronchitis   Tobacco use disorder    Time spent: 25 minutes  Christiane Ha, MD  [facility] Pager [phone]. If 7PM-7AM, please contact night-coverage at [email or web address], password [facility] 12/05/2013, 7:00 PM  LOS: 2 days

## 2013-12-05 NOTE — Progress Notes (Addendum)
PULMONARY / CRITICAL CARE MEDICINE Name: Derek Conner MRN: [id number] DOB: [birth year]    ADMISSION DATE:  12/03/2013 CONSULTATION DATE:  12/03/2013  REFERRING MD :  Rhona Leavens PRIMARY SERVICE:  TRH  CHIEF COMPLAINT:  Hemoptysis   BRIEF PATIENT DESCRIPTION: 74 active smoker with over 40 pack-year history who presented on  to the ED with gross hemoptysis since 4/28. Reports up to two cups of blood per episode. PCCM asked to see for hemoptysis   SIGNIFICANT EVENTS / STUDIES:  5/1  Chest CT > Nodular consolidation in the right upper lobe. Followup CT chest  without contrast in 3 months is recommended in further evaluation,  as primary bronchogenic carcinoma can have this appearance 12/04/13 Quantiferon Gold >>   LINES / TUBES:  CULTURES: AFB sputum 5/3 >>>  ANTIBIOTICS: Rocephin/zmax 5/1 >5/2 Levaquin 5/2 (? Atypical TB/? CAP) >>>     INTERVAL HISTORY/Subjective Much less bleeding x last 24 h No sob or cp  VITAL SIGNS: Temp:  [97.8 F (36.6 C)-98.8 F (37.1 C)] 98 F (36.7 C) (05/03 0930) Pulse Rate:  [80-110] 80 (05/03 0930) Resp:  [18-22] 21 (05/03 0930) BP: (161-187)/(88-112) 161/94 mmHg (05/03 0930) SpO2:  [97 %-99 %] 97 % (05/03 0930) Weight:  [178 lb 5.6 oz (80.9 kg)] 178 lb 5.6 oz (80.9 kg) (05/02 2027)  PHYSICAL EXAMINATION: General:  Middle aged white male, well nourished and in no acute distress Neuro:  Awake, oriented, no focal def  HEENT:  [place] Cardiovascular:  rrr Lungs:  Scattered rhonchi  Abdomen:  Soft, non-tender  Musculoskeletal:  Intact  Skin:  Intact   LABS:  CBC  Recent Labs Lab 12/03/13 0619 12/04/13 0403 12/05/13 0420  WBC 11.0* 12.0* 8.9  HGB 14.5 12.8* 12.9*  HCT 43.4 38.9* 39.5  PLT 318 312 296   Coag's  Recent Labs Lab 12/03/13 0619  APTT 30  INR 0.92   BMET  Recent Labs Lab 12/03/13 0619 12/04/13 0403 12/05/13 0420  NA 141 141 141  K 4.6 4.6 3.8  CL 104 104 102  CO2 26 22 27   BUN 13 17 17   CREATININE 1.20 1.16  1.26  GLUCOSE 113* 108* 104*   Electrolytes  Recent Labs Lab 12/03/13 0619 12/04/13 0403 12/05/13 0420  CALCIUM 9.7 9.1 9.1   Sepsis Markers No results found for this basename: LATICACIDVEN, PROCALCITON, O2SATVEN,  in the last 168 hours ABG No results found for this basename: PHART, PCO2ART, PO2ART,  in the last 168 hours Liver Enzymes  Recent Labs Lab 12/03/13 0619 12/04/13 0403  AST 20 16  ALT 15 11  ALKPHOS 107 66  BILITOT 0.2* 0.3  ALBUMIN 3.8 3.2*   Cardiac Enzymes No results found for this basename: TROPONINI, PROBNP,  in the last 168 hours Glucose No results found for this basename: GLUCAP,  in the last 168 hours  IMAGING:  Ct Chest W Contrast  12/03/2013   CLINICAL DATA:  Hemoptysis, tobacco abuse.  EXAM: CT CHEST WITH CONTRAST  TECHNIQUE: Multidetector CT imaging of the chest was performed during intravenous contrast administration.  CONTRAST:  02/03/14 OMNIPAQUE IOHEXOL 300 MG/ML  SOLN  COMPARISON:  None.  FINDINGS: Mediastinal lymph nodes are not enlarged by CT size criteria. No hilar or axillary adenopathy. Heart size normal. No pericardial effusion. Tiny hiatal hernia.  Centrilobular emphysema with bullous changes of the apices. Confluent nodular density in the right upper lobe measures 0.9 x 1.9 cm (image 16). Additional mild peribronchovascular nodularity and bronchial wall thickening in the right upper  lobe. Linear scarring in the left upper lobe. Additional nodules measure up to 8 mm in the left upper lobe (image 34). No pleural fluid. Minimal debris is seen dependently in the lower trachea and within the right mainstem bronchus and bronchus intermedius.  Incidental imaging of the upper abdomen shows the visualized portions of the liver, gallbladder and adrenal glands to the grossly unremarkable. Low-attenuation lesions in the kidneys measure up to 10 mm on the right and are poorly evaluated in the absence of precontrast imaging and due to size. Visualized portions of  the spleen, pancreas, stomach and bowel are otherwise grossly unremarkable. No upper abdominal adenopathy. No worrisome lytic or sclerotic lesions.  IMPRESSION: 1. Nodular consolidation in the right upper lobe. Followup CT chest without contrast in 3 months is recommended in further evaluation, as primary bronchogenic carcinoma can have this appearance. 2. Additional scattered small pulmonary nodules measure up to 8 mm in left upper lobe. Continued attention on followup exams is warranted.   Electronically Signed   By: Leanna Battles M.D.   On: 12/03/2013 16:33   ASSESSMENT / PLAN:  Hemoptysis  Acute bronchitis  COPD by history, but no evidence of bronchospasm on examination Tobacco use disorder ? Atypica TB  Supplemental oxygen PRN  Bronchodilators PRN  No indications for systemic steroids  Agree with antibiotics> changed to levquin 5/2 due to concerns with MAI  Smoking cessation advised  Discussed in detail all the  indications, usual  risks and alternatives  relative to the benefits with patient who agrees to proceed with bronchoscopy with biopsy after first check an afb smear result so keep npo p MN tonight and we'll see about getting him on the schedule     Sandrea Hughs, MD Pulmonary and Critical Care Medicine [facility] Cell [phone] After 5:30 PM or weekends, call [phone]

## 2013-12-06 ENCOUNTER — Encounter (HOSPITAL_COMMUNITY)
Admission: EM | Disposition: A | Discharge status: Home / Self Care | Payer: Self-pay | Source: Home / Self Care | Attending: Internal Medicine

## 2013-12-06 ENCOUNTER — Encounter (HOSPITAL_COMMUNITY): Payer: Self-pay

## 2013-12-06 ENCOUNTER — Inpatient Hospital Stay (HOSPITAL_COMMUNITY): Payer: Self-pay

## 2013-12-06 DIAGNOSIS — R911 Solitary pulmonary nodule: Secondary | ICD-10-CM

## 2013-12-06 HISTORY — PX: VIDEO BRONCHOSCOPY: SHX5072

## 2013-12-06 LAB — BODY FLUID CELL COUNT WITH DIFFERENTIAL
Eos, Fluid: 5 %
LYMPHS FL: 13 %
MONOCYTE-MACROPHAGE-SEROUS FLUID: 10 % — AB (ref 50–90)
NEUTROPHIL FLUID: 72 % — AB (ref 0–25)
WBC FLUID: 469 uL (ref 0–1000)

## 2013-12-06 LAB — QUANTIFERON TB GOLD ASSAY (BLOOD)
Interferon Gamma Release Assay: NEGATIVE
QUANTIFERON NIL VALUE: 0.02 [IU]/mL
TB Ag value: 0.02 IU/mL
TB Antigen Minus Nil Value: 0 IU/mL

## 2013-12-06 SURGERY — VIDEO BRONCHOSCOPY WITHOUT FLUORO
Anesthesia: Moderate Sedation | Laterality: Bilateral

## 2013-12-06 MED ORDER — FENTANYL CITRATE 0.05 MG/ML IJ SOLN
INTRAMUSCULAR | Status: AC
  Filled 2013-12-06: qty 4

## 2013-12-06 MED ORDER — PHENYLEPHRINE HCL 0.25 % NA SOLN
NASAL | Status: DC | PRN
  Administered 2013-12-06: 2

## 2013-12-06 MED ORDER — LIDOCAINE HCL 1 % IJ SOLN
INTRAMUSCULAR | Status: DC | PRN
  Administered 2013-12-06: 6 mL via RESPIRATORY_TRACT

## 2013-12-06 MED ORDER — MIDAZOLAM HCL 10 MG/2ML IJ SOLN
INTRAMUSCULAR | Status: DC | PRN
  Administered 2013-12-06: 1 mg via INTRAVENOUS
  Administered 2013-12-06: 2 mg via INTRAVENOUS

## 2013-12-06 MED ORDER — FENTANYL CITRATE 0.05 MG/ML IJ SOLN
INTRAMUSCULAR | Status: DC | PRN
  Administered 2013-12-06: 25 ug via INTRAVENOUS
  Administered 2013-12-06 (×2): 50 ug via INTRAVENOUS
  Administered 2013-12-06: 25 ug via INTRAVENOUS

## 2013-12-06 MED ORDER — IPRATROPIUM-ALBUTEROL 0.5-2.5 (3) MG/3ML IN SOLN
3.0000 mL | Freq: Four times a day (QID) | RESPIRATORY_TRACT | Status: DC | PRN
  Administered 2013-12-06 – 2013-12-07 (×2): 3 mL via RESPIRATORY_TRACT
  Filled 2013-12-06 (×2): qty 3

## 2013-12-06 MED ORDER — MIDAZOLAM HCL 5 MG/ML IJ SOLN
INTRAMUSCULAR | Status: AC
  Filled 2013-12-06: qty 2

## 2013-12-06 MED ORDER — SODIUM CHLORIDE 0.9 % IV SOLN
INTRAVENOUS | Status: DC
  Administered 2013-12-06: 14:00:00 via INTRAVENOUS

## 2013-12-06 MED ORDER — LIDOCAINE HCL 2 % EX GEL
CUTANEOUS | Status: DC | PRN
  Administered 2013-12-06: 1

## 2013-12-06 MED ORDER — LORAZEPAM 2 MG/ML IJ SOLN
1.0000 mg | Freq: Once | INTRAMUSCULAR | Status: AC
  Administered 2013-12-06: 1 mg via INTRAVENOUS
  Filled 2013-12-06: qty 1

## 2013-12-06 MED ORDER — METOPROLOL TARTRATE 1 MG/ML IV SOLN
INTRAVENOUS | Status: AC
  Filled 2013-12-06: qty 5

## 2013-12-06 NOTE — Progress Notes (Signed)
Video Bronchoscopy done  Intervention Bronchial washing done  X 2  Procedure tolerated well

## 2013-12-06 NOTE — Progress Notes (Signed)
PULMONARY / CRITICAL CARE MEDICINE Name: Derek Conner MRN: [id number] DOB: 02/06/1961    ADMISSION DATE:  12/03/2013 CONSULTATION DATE:  12/03/2013  REFERRING MD :  Rhona Leavens PRIMARY SERVICE:  TRH  CHIEF COMPLAINT:  Hemoptysis   BRIEF PATIENT DESCRIPTION: 17 active smoker with over 40 pack-year history who presented on  to the ED with gross hemoptysis since 4/28. Reports up to two cups of blood per episode. PCCM asked to see for hemoptysis   SIGNIFICANT EVENTS / STUDIES:  5/1  Chest CT > Nodular consolidation in the right upper lobe. Followup CT chest  without contrast in 3 months is recommended in further evaluation,  as primary bronchogenic carcinoma can have this appearance 12/04/13 Quantiferon Gold >>   LINES / TUBES:  CULTURES: AFB sputum 5/3 >>>  ANTIBIOTICS: Rocephin/zmax 5/1 >5/2 Levaquin 5/2 (? Atypical TB/? CAP) >>>    EVENTS 12/05/13: Much less bleeding x last 24 h No sob or cp    SUBJECTIVE/OVERNIGHT/INTERVAL HX 12/06/13: continued but less hemoptysis. REmains NPO. Sputum AFB smear x 1 pending  VITAL SIGNS: Temp:  [98 F (36.7 C)-98.7 F (37.1 C)] 98 F (36.7 C) (05/04 0957) Pulse Rate:  [90-130] 100 (05/04 0957) Resp:  [16-21] 18 (05/04 0957) BP: (139-186)/(91-119) 142/91 mmHg (05/04 0957) SpO2:  [92 %-98 %] 98 % (05/04 0957) Weight:  [80.2 kg (176 lb 12.9 oz)] 80.2 kg (176 lb 12.9 oz) (05/03 2120)  PHYSICAL EXAMINATION: General:  Middle aged white male, well nourished and in no acute distress Neuro:  Awake, oriented, no focal def  HEENT:  [place] Cardiovascular:  rrr Lungs:  Scattered rhonchi  Abdomen:  Soft, non-tender  Musculoskeletal:  Intact  Skin:  Intact   LABS:  PULMONARY No results found for this basename: PHART, PCO2, PCO2ART, PO2, PO2ART, HCO3, TCO2, O2SAT,  in the last 168 hours  CBC  Recent Labs Lab 12/03/13 0619 12/04/13 0403 12/05/13 0420  HGB 14.5 12.8* 12.9*  HCT 43.4 38.9* 39.5  WBC 11.0* 12.0* 8.9  PLT 318 312 296     COAGULATION  Recent Labs Lab 12/03/13 0619  INR 0.92    CARDIAC  No results found for this basename: TROPONINI,  in the last 168 hours No results found for this basename: PROBNP,  in the last 168 hours   CHEMISTRY  Recent Labs Lab 12/03/13 0619 12/04/13 0403 12/05/13 0420  NA 141 141 141  K 4.6 4.6 3.8  CL 104 104 102  CO2 26 22 27   GLUCOSE 113* 108* 104*  BUN 13 17 17   CREATININE 1.20 1.16 1.26  CALCIUM 9.7 9.1 9.1   Estimated Creatinine Clearance: 72.2 ml/min (by C-G formula based on Cr of 1.26).   LIVER  Recent Labs Lab 12/03/13 0619 12/04/13 0403  AST 20 16  ALT 15 11  ALKPHOS 107 66  BILITOT 0.2* 0.3  PROT 7.4 6.4  ALBUMIN 3.8 3.2*  INR 0.92  --      INFECTIOUS No results found for this basename: LATICACIDVEN, PROCALCITON,  in the last 168 hours   ENDOCRINE CBG (last 3)  No results found for this basename: GLUCAP,  in the last 72 hours       IMAGING x48h  No results found.     Hemoptysis  COPD by history, but no evidence of bronchospasm on examination Tobacco use disorder  RUL nodule with hemoptysis  - ddx acute pneumonia, nsclc, MTb and Atypical TB (MAI)  Discussed bronchoscopy. of Right upper lobe. He needs airway bronch exam and  BAL/transbronchial biopsy of right upper lobe. However,   It is too distal and surrounded by emphysema to safely and with high yield to do transbronchial biopsy. Best is do Airway exam and  BAL of RUL which has high NPV for MTb and can potentially shorten his hospital stay for rule out Tb by 1-3 days. He understands risks  Risks of pneumothorax, hemothorax, sedation/anesthesia complications such as cardiac or respiratory arrest or hypotension, stroke and bleeding all explained. Benefits of diagnosis but limitations of non-diagnosis also explained.   He also understands that later on he might need ENB bronch transbronchial biopsy   Patient verbalized understanding and wished to proceed.   Bronch  available only 14.30 at 12/06/2013: Dr Tyson Alias  Will do. Clinically appears low risk for sedation complications  PLAN  - bronch under moderate sedation  - airway exam  - BAL - RIght upper lobe for   - gram stain and culture  - cell count and differential  - fungal, PCP stain  - AFB smear and culture  - MTb PCR   - cytology malignant cells   Dr. Kalman Shan, M.D., [facility].C.P Pulmonary and Critical Care Medicine Staff Physician [facility] Pager: [phone], If no answer or between  15:00h - 7:00h: call [phone]  12/06/2013 10:53 AM

## 2013-12-06 NOTE — Procedures (Signed)
Bronchoscopy Procedure Note Derek Conner [id number] [birth year]  Procedure: Bronchoscopy Indications: Diagnostic evaluation of the airways and Obtain specimens for culture and/or other diagnostic studies  Procedure Details Consent: Risks of procedure as well as the alternatives and risks of each were explained to the (patient/caregiver).  Consent for procedure obtained. Time Out: Verified patient identification, verified procedure, site/side was marked, verified correct patient position, special equipment/implants available, medications/allergies/relevent history reviewed, required imaging and test results available.  Performed  In preparation for procedure, patient was given 100% FiO2 and bronchoscope lubricated. Sedation: Benzodiazepines  Airway entered and the following bronchi were examined: RUL, RML, RLL, LUL, LLL and Bronchi.   Procedures performed: Brushings performed no, HTN Bronchoscope removed.  , Patient placed back on 6 lit FiO2 at conclusion of procedure.    Evaluation Hemodynamic Status: BP stable throughout; O2 sats: stable throughout Patient's Current Condition: stable Specimens:  Sent bloody specimens Complications: No apparent complications Patient did tolerate procedure well.   Nelda Bucks 12/06/2013   1. Bloody frank frothy secretions from RUL, unable to tell which lobe orifice 2. No endobronchial lesions in any lobe, BI, LEft , LUL, lingula 3. BAL lavage bloody RUL  Mcarthur Rossetti. Tyson Alias, MD, FACP Pgr: [phone] [facility]

## 2013-12-06 NOTE — Progress Notes (Signed)
Tolerated well Derek Conner J. Tobi Leinweber, MD, FACP Pgr: [phone]  Pulmonary & Critical Care  

## 2013-12-06 NOTE — Progress Notes (Signed)
[facility] PROGRESS NOTE  Derek Conner WSF:[id number] DOB: June 30, 1961 DOA: 12/03/2013 PCP: Rosario Adie, MD  Assessment/Plan: 1. Hemoptysis -CT scan of lungs with IV contrast performed 12/03/2013 show a nodular consolidation in the right upper lobe -Patient with 40-pack-year history of tobacco abuse Levaquin 750 mg IV q daily Status post fiberoptic bronchoscopy on 5/4: No endobronchial lesion seen any lobe, bloody frank frothy secretions from right upper lobe-lavaged. Follow results. Quantiferon gold pending  2. COPD Improved on bronchodilators.  3. Hypertension Continue to be elevated. Norvasc started during this admission. Add clonidine. Improved.  History of daily alcohol use: No evidence of DTs.   DVT prophylaxis -SCDs  Code Status: Full code Family Communication: None at bedside. Disposition Plan: Home possibly next 48 hours   Consultants:  Pulmonary critical care medicine  Antibiotics:  Levaquin 750 mg IV every 24 hours (started on 12/14/2013)  HPI/Subjective: Mild intermittent hemoptysis. No dyspnea.  Objective: Filed Vitals:   12/06/13 1646  BP: 126/84  Pulse:   Temp:   Resp:    temperature 98.59F, pulse 112 per minute, respiration 18, blood pressure 126/84. Oxygen saturations 93%  Intake/Output Summary (Last 24 hours) at 12/06/13 1653 Last data filed at 12/06/13 1500  Gross per 24 hour  Intake    720 ml  Output      0 ml  Net    720 ml   Filed Weights   12/03/13 0922 12/04/13 2027 12/05/13 2120  Weight: 81.6 kg (179 lb 14.3 oz) 80.9 kg (178 lb 5.6 oz) 80.2 kg (176 lb 12.9 oz)    Exam:   General:  Patient sitting up comfortably in bed. Seen prior to bronchoscopy.  Cardiovascular: Regular rate and rhythm normal S1-S2. Telemetry: Sinus rhythm. Occasional sinus tachycardia.  Respiratory: Diminished throughout with coarse breath sounds. No wheezing. No increased work of breathing.  Abdomen: Soft nontender  nondistended  Musculoskeletal: No edema  Data Reviewed: Basic Metabolic Panel:  Recent Labs Lab 12/03/13 0619 12/04/13 0403 12/05/13 0420  NA 141 141 141  K 4.6 4.6 3.8  CL 104 104 102  CO2 26 22 27   GLUCOSE 113* 108* 104*  BUN 13 17 17   CREATININE 1.20 1.16 1.26  CALCIUM 9.7 9.1 9.1   Liver Function Tests:  Recent Labs Lab 12/03/13 0619 12/04/13 0403  AST 20 16  ALT 15 11  ALKPHOS 107 66  BILITOT 0.2* 0.3  PROT 7.4 6.4  ALBUMIN 3.8 3.2*   No results found for this basename: LIPASE, AMYLASE,  in the last 168 hours No results found for this basename: AMMONIA,  in the last 168 hours CBC:  Recent Labs Lab 12/03/13 0619 12/04/13 0403 12/05/13 0420  WBC 11.0* 12.0* 8.9  NEUTROABS 6.8  --   --   HGB 14.5 12.8* 12.9*  HCT 43.4 38.9* 39.5  MCV 89.1 88.6 90.0  PLT 318 312 296   Cardiac Enzymes: No results found for this basename: CKTOTAL, CKMB, CKMBINDEX, TROPONINI,  in the last 168 hours BNP (last 3 results) No results found for this basename: PROBNP,  in the last 8760 hours CBG: No results found for this basename: GLUCAP,  in the last 168 hours  Recent Results (from the past 240 hour(s))  AFB CULTURE WITH SMEAR     Status: None   Collection Time    12/05/13  6:29 AM      Result Value Ref Range Status   Specimen Description SPUTUM   Final   Special Requests Normal   Final  Acid Fast Smear     Final   Value: NO ACID FAST BACILLI SEEN     Performed at [facility]   Culture     Final   Value: CULTURE WILL BE EXAMINED FOR 6 WEEKS BEFORE ISSUING A FINAL REPORT     Performed at [facility]   Report Status PENDING   Incomplete     Studies: No results found.  Scheduled Meds: . amLODipine  10 mg Oral Daily  . cloNIDine  0.1 mg Oral BID  . folic acid  1 mg Oral Daily  . ipratropium-albuterol  3 mL Nebulization TID  . levofloxacin (LEVAQUIN) IV  750 mg Intravenous Q24H  . LORazepam  0-4 mg Oral Q12H  . multivitamin with minerals  1  tablet Oral Daily  . sodium chloride  3 mL Intravenous Q12H  . thiamine  100 mg Oral Daily   Or  . thiamine  100 mg Intravenous Daily   Continuous Infusions: . sodium chloride 10 mL/hr at 12/06/13 1427    Active Problems:   Hemoptysis   COPD (chronic obstructive pulmonary disease)   Acute bronchitis   Tobacco use disorder   Pulmonary nodule, right    Time spent: 25 minutes  Elease Etienne, MD, FACP, [facility]. [facility] Pager [phone]  If 7PM-7AM, please contact night-coverage [email or web address] Password [email or web address] 12/06/2013, 5:00 PM  12/06/2013, 4:53 PM  LOS: 3 days

## 2013-12-07 ENCOUNTER — Encounter (HOSPITAL_COMMUNITY): Payer: Self-pay | Admitting: Pulmonary Disease

## 2013-12-07 LAB — FUNGAL STAIN: Fungal Smear: NONE SEEN

## 2013-12-07 LAB — PNEUMOCYSTIS JIROVECI SMEAR BY DFA: PNEUMOCYSTIS JIROVECI AG: NEGATIVE

## 2013-12-07 MED ORDER — LEVOFLOXACIN 750 MG PO TABS
750.0000 mg | ORAL_TABLET | Freq: Every day | ORAL | Status: DC
  Administered 2013-12-07 – 2013-12-08 (×2): 750 mg via ORAL
  Filled 2013-12-07 (×2): qty 1

## 2013-12-07 NOTE — Progress Notes (Signed)
PHARMACIST - PHYSICIAN COMMUNICATION DR:   Waymon Amato CONCERNING: Antibiotic IV to Oral Route Change Policy  RECOMMENDATION: This patient is receiving Levaquin by the intravenous route.  Based on criteria approved by the Pharmacy and Therapeutics Committee, the antibiotic(s) is/are being converted to the equivalent oral dose form(s).   DESCRIPTION: These criteria include:  Patient being treated for a respiratory tract infection, urinary tract infection, cellulitis or clostridium difficile associated diarrhea if on metronidazole  The patient is not neutropenic and does not exhibit a GI malabsorption state  The patient is eating (either orally or via tube) and/or has been taking other orally administered medications for a least 24 hours  The patient is improving clinically and has a Tmax < 100.5  If you have questions about this conversion, please contact the Pharmacy Department  []   [phone] )  [phone] [x]   [phone] )   []   [phone] )  [facility] []   [phone] )  [facility]    [facility], PharmD, BCPS Clinical Pharmacist Pager: [phone] 12/07/2013 11:25 AM

## 2013-12-07 NOTE — Progress Notes (Signed)
[facility] PROGRESS NOTE  Derek Conner OHY:[id number] DOB: 20-Mar-1961 DOA: 12/03/2013 PCP: Rosario Adie, MD  Assessment/Plan: 1. Hemoptysis -CT scan of lungs with IV contrast performed 12/03/2013 show a nodular consolidation in the right upper lobe -Patient with 40-pack-year history of tobacco abuse Levaquin 750 mg IV q daily Status post fiberoptic bronchoscopy on 5/4: No endobronchial lesion seen any lobe, bloody frank frothy secretions from right upper lobe-lavaged. Follow results. Quantiferon gold: Negative - Bronchoalveolar lavage: No malignant cells, no AFB on smear (culture pending), no yeast or fungal elements on fungal smear (culture pending), pneumocystis antigen negative  2. COPD Improved on bronchodilators.  3. Hypertension Continue to be elevated. Norvasc started during this admission. Add clonidine. Improved.  History of daily alcohol use: No evidence of DTs.   DVT prophylaxis -SCDs  Code Status: Full code Family Communication: None at bedside. Disposition Plan: Home possibly 5/6   Consultants:  Pulmonary critical care medicine  Antibiotics:  Levaquin 750 mg IV every 24 hours (started on 12/14/2013)  HPI/Subjective: Hemoptysis decreased. Some dyspnea last night but none today.  Objective: Filed Vitals:   12/07/13 1300  BP: 132/84  Pulse: 82  Temp:   Resp: 12   temperature 98.60F, oxygen saturation 96%  Intake/Output Summary (Last 24 hours) at 12/07/13 1607 Last data filed at 12/06/13 2300  Gross per 24 hour  Intake    273 ml  Output      0 ml  Net    273 ml   Filed Weights   12/03/13 0922 12/04/13 2027 12/05/13 2120  Weight: 81.6 kg (179 lb 14.3 oz) 80.9 kg (178 lb 5.6 oz) 80.2 kg (176 lb 12.9 oz)    Exam:   General:  Patient sitting up comfortably in bed working on his computer  Cardiovascular: Regular rate and rhythm normal S1-S2. Telemetry: Sinus rhythm. Occasional sinus tachycardia.  Respiratory: Diminished throughout  with coarse breath sounds. No wheezing. No increased work of breathing.  Abdomen: Soft nontender nondistended  Musculoskeletal: No edema  Data Reviewed: Basic Metabolic Panel:  Recent Labs Lab 12/03/13 0619 12/04/13 0403 12/05/13 0420  NA 141 141 141  K 4.6 4.6 3.8  CL 104 104 102  CO2 26 22 27   GLUCOSE 113* 108* 104*  BUN 13 17 17   CREATININE 1.20 1.16 1.26  CALCIUM 9.7 9.1 9.1   Liver Function Tests:  Recent Labs Lab 12/03/13 0619 12/04/13 0403  AST 20 16  ALT 15 11  ALKPHOS 107 66  BILITOT 0.2* 0.3  PROT 7.4 6.4  ALBUMIN 3.8 3.2*   No results found for this basename: LIPASE, AMYLASE,  in the last 168 hours No results found for this basename: AMMONIA,  in the last 168 hours CBC:  Recent Labs Lab 12/03/13 0619 12/04/13 0403 12/05/13 0420  WBC 11.0* 12.0* 8.9  NEUTROABS 6.8  --   --   HGB 14.5 12.8* 12.9*  HCT 43.4 38.9* 39.5  MCV 89.1 88.6 90.0  PLT 318 312 296   Cardiac Enzymes: No results found for this basename: CKTOTAL, CKMB, CKMBINDEX, TROPONINI,  in the last 168 hours BNP (last 3 results) No results found for this basename: PROBNP,  in the last 8760 hours CBG: No results found for this basename: GLUCAP,  in the last 168 hours  Recent Results (from the past 240 hour(s))  AFB CULTURE WITH SMEAR     Status: None   Collection Time    12/05/13  6:29 AM      Result Value Ref  Range Status   Specimen Description SPUTUM   Final   Special Requests Normal   Final   Acid Fast Smear     Final   Value: NO ACID FAST BACILLI SEEN     Performed at [facility]   Culture     Final   Value: CULTURE WILL BE EXAMINED FOR 6 WEEKS BEFORE ISSUING A FINAL REPORT     Performed at [facility]   Report Status PENDING   Incomplete  AFB CULTURE WITH SMEAR     Status: None   Collection Time    12/06/13  3:28 PM      Result Value Ref Range Status   Specimen Description BRONCHIAL ALVEOLAR LAVAGE   Final   Special Requests NONE   Final   Acid Fast  Smear     Final   Value: NO ACID FAST BACILLI SEEN     Performed at [facility]   Culture     Final   Value: CULTURE WILL BE EXAMINED FOR 6 WEEKS BEFORE ISSUING A FINAL REPORT     Performed at [facility]   Report Status PENDING   Incomplete  FUNGAL STAIN     Status: None   Collection Time    12/06/13  3:28 PM      Result Value Ref Range Status   Specimen Description BRONCHIAL ALVEOLAR LAVAGE   Final   Special Requests NONE   Final   Fungal Smear     Final   Value: NO YEAST OR FUNGAL ELEMENTS SEEN     Performed at [facility]   Report Status 12/07/2013 FINAL   Final  CULTURE, BAL-QUANTITATIVE     Status: None   Collection Time    12/06/13  3:28 PM      Result Value Ref Range Status   Specimen Description BRONCHIAL ALVEOLAR LAVAGE   Final   Special Requests NONE   Final   Gram Stain     Final   Value: MODERATE WBC PRESENT, PREDOMINANTLY PMN     NO SQUAMOUS EPITHELIAL CELLS SEEN     RARE GRAM POSITIVE COCCI IN PAIRS     Performed at [facility] Count PENDING   Incomplete   Culture PENDING   Incomplete   Report Status PENDING   Incomplete  PNEUMOCYSTIS JIROVECI SMEAR BY DFA     Status: None   Collection Time    12/06/13  3:29 PM      Result Value Ref Range Status   Specimen Source-[facility] BRONCHIAL ALVEOLAR LAVAGE   Final   Pneumocystis jiroveci Ag NEGATIVE   Final   Comment: Performed at [facility]     Studies: Dg Chest Port 1 View  12/06/2013   CLINICAL DATA:  Bronchoscopy.  EXAM: PORTABLE CHEST - 1 VIEW  COMPARISON:  CT CHEST W/CM dated 12/03/2013; DG CHEST 2 VIEW dated 12/03/2013  FINDINGS: Mediastinum hilar structures are normal. Focus of atelectasis and/or mass right mid lung. No pneumothorax. No pleural effusion. No pneumothorax. COPD. Heart size normal.  IMPRESSION: 1.  No pneumothorax post bronchoscopy.  2. Right mid lung atelectasis and/or mass again noted. This appears more prominent on today's exam.    Electronically Signed   By: Maisie Fus  Register   On: 12/06/2013 16:52    Scheduled Meds: . amLODipine  10 mg Oral Daily  . cloNIDine  0.1 mg Oral BID  . folic acid  1 mg Oral  Daily  . levofloxacin  750 mg Oral Daily  . multivitamin with minerals  1 tablet Oral Daily  . sodium chloride  3 mL Intravenous Q12H  . thiamine  100 mg Oral Daily   Continuous Infusions:    Active Problems:   Hemoptysis   COPD (chronic obstructive pulmonary disease)   Acute bronchitis   Tobacco use disorder   Pulmonary nodule, right    Time spent: 25 minutes  Elease Etienne, MD, FACP, [facility]. [facility] Pager [phone]  If 7PM-7AM, please contact night-coverage [email or web address] Password [email or web address] 12/07/2013, 4:07 PM  12/07/2013, 4:07 PM  LOS: 4 days

## 2013-12-07 NOTE — Plan of Care (Signed)
Problem: Consults Goal: General Medical Patient Education See Patient Education Module for specific education.  Outcome: Completed/Met Date Met:  12/07/13 Reviewed plan of care with patient, Treatments.

## 2013-12-07 NOTE — Plan of Care (Signed)
Problem: Phase I Progression Outcomes Goal: Pain controlled with appropriate interventions Outcome: Not Applicable Date Met:  35/00/93 Pt denies pain.  Goal: Initial discharge plan identified Outcome: Progressing Pending outcomes of diagnostic testing Goal: Hemodynamically stable Outcome: Completed/Met Date Met:  12/07/13 BP improved with clonidine

## 2013-12-08 LAB — PATHOLOGIST SMEAR REVIEW

## 2013-12-08 MED ORDER — FOLIC ACID 1 MG PO TABS: 1.0000 mg | ORAL_TABLET | Freq: Every day | ORAL | Status: DC

## 2013-12-08 MED ORDER — THIAMINE HCL 100 MG PO TABS: 100.0000 mg | ORAL_TABLET | Freq: Every day | ORAL | Status: DC

## 2013-12-08 MED ORDER — AMLODIPINE BESYLATE 10 MG PO TABS: 10.0000 mg | ORAL_TABLET | Freq: Every day | ORAL | Status: DC

## 2013-12-08 MED ORDER — LEVOFLOXACIN 750 MG PO TABS: 750.0000 mg | ORAL_TABLET | Freq: Every day | ORAL | Status: DC

## 2013-12-08 MED ORDER — IPRATROPIUM-ALBUTEROL 18-103 MCG/ACT IN AERO: 2.0000 | INHALATION_SPRAY | Freq: Three times a day (TID) | RESPIRATORY_TRACT | Status: DC

## 2013-12-08 MED ORDER — ALBUTEROL SULFATE (2.5 MG/3ML) 0.083% IN NEBU: 2.5000 mg | INHALATION_SOLUTION | RESPIRATORY_TRACT | Status: DC | PRN

## 2013-12-08 MED ORDER — CLONIDINE HCL 0.1 MG PO TABS: 0.1000 mg | ORAL_TABLET | Freq: Two times a day (BID) | ORAL | Status: DC

## 2013-12-08 MED ORDER — IPRATROPIUM-ALBUTEROL 20-100 MCG/ACT IN AERS: 6.0000 | INHALATION_SPRAY | Freq: Four times a day (QID) | RESPIRATORY_TRACT | Status: DC

## 2013-12-08 MED ORDER — ADULT MULTIVITAMIN W/MINERALS CH: 1.0000 | ORAL_TABLET | Freq: Every day | ORAL | Status: DC

## 2013-12-08 MED ORDER — IPRATROPIUM-ALBUTEROL 0.5-2.5 (3) MG/3ML IN SOLN: 3.0000 mL | Freq: Four times a day (QID) | RESPIRATORY_TRACT | Status: DC

## 2013-12-08 NOTE — Discharge Summary (Addendum)
Physician Discharge Summary  Derek Conner SWN:[id number] DOB: 04-19-1961 DOA: 12/03/2013  PCP: Rosario Adie, MD  Admit date: 12/03/2013 Discharge date: 12/08/2013  Time spent: Less than 30 minutes  Recommendations for Outpatient Follow-up:  1. Dr. Kalman Shan, Pulmonology on 12/24/2013 at 3 PM. 2. Dr. Jola Schmidt, PCP in 5 days.  Discharge Diagnoses:  Active Problems:   Hemoptysis   COPD (chronic obstructive pulmonary disease)   Acute bronchitis   Tobacco use disorder   Pulmonary nodule, right   Discharge Condition: Improved & Stable  Diet recommendation: Heart Healthy diet.  Filed Weights   12/03/13 9381 12/04/13 2027 12/05/13 2120  Weight: 81.6 kg (179 lb 14.3 oz) 80.9 kg (178 lb 5.6 oz) 80.2 kg (176 lb 12.9 oz)    History of present illness:  53 year old male patient with history of hypertension, active tobacco abuse (40 pack year history), active alcohol abuse (6-8 beers daily), presented to the ED on 12/03/13 with gross hemoptysis since 11/30/13. He reported up to 2 cups of blood per episode. In the ED, hemoglobin 14 and chest x-ray suggestive of emphysema/COPD.  Hospital Course:   1. RUL nodule with hemoptysis: CT chest showed nodular consolidation in the right upper lobe and additional scattered small pulmonary nodules measuring up to 8 mm in the left upper lobe. DD-? Pneumonia versus malignancy versus MTB versus MAI. Pulmonology was consulted. He was placed on airborne isolation. Patient received a single dose of IV Rocephin and azithromycin after which his antibiotics were changed to levofloxacin. Patient underwent fiberoptic bronchoscopy with bronchoalveolar lavage on 5/4. No endobronchial lesions seen on any lobe. BAL AFB, fungal smear and PCP negative thus far. Final cultures pending. BAL Cytology negative. Quantiferon Gold negative. Patient has clinically improved-with minimal nonproductive cough but no further frank hemoptysis. Denies dyspnea. Pulmonology has seen  today and cleared him for discharge. They advised additional 3 days of levofloxacin to complete a total 8 days treatment, followup final AFB and fungal culture results as outpatient. They indicate that he may still need bronchoscopy and transbronchial biopsy which can be decided at outpatient followup after repeat CT. Scheduled Combivent added and continue when necessary albuterol inhalers. Tobacco cessation has been counseled. Patient declines nicotine patch 2. COPD: Stable. Bronchodilators and antibiotics as above. Tobacco cessation counseled. 3. Hypertension: On current regimen below, blood pressures mildly uncontrolled. These can be further managed during outpatient visit with PCP. 4. Alcohol abuse: Moderation counseled. No features of withdrawal. Continue thiamine, folate and multivitamins.   Consultations:  Pulmonology  Procedures:  Fiberoptic bronchoscopy and bronchoalveolar lavage on 5/4    Discharge Exam:  Complaints:  Minimal, mostly nonproductive cough. No further frank hemoptysis. Denies dyspnea. Anxious to go home.  Filed Vitals:   12/07/13 1300 12/07/13 2206 12/08/13 0526 12/08/13 0900  BP: 132/84 141/66 140/83 156/99  Pulse: 82 89 81 87  Temp:  98.4 F (36.9 C) 98.6 F (37 C) 98.1 F (36.7 C)  TempSrc:  Oral Oral Oral  Resp: 12 16 18 17   Height:      Weight:      SpO2:  99% 97% 94%    General: Patient sitting up comfortably in bed. Cardiovascular: Regular rate and rhythm normal S1-S2. Telemetry: Sinus rhythm. Respiratory: Scattered rhonchi right upper lung fields. Rest of lung fields clear to auscultation. No increased work of breathing.  Abdomen: Soft nontender nondistended  Musculoskeletal: No edema   Discharge Instructions      Discharge Orders   Future Appointments Provider Department Dept Phone  12/24/2013 3:00 PM Kalman Shan, MD [facility] [phone]   Future Orders Complete By Expires   Call MD for:  difficulty breathing,  headache or visual disturbances  As directed    Call MD for:  temperature >100.4  As directed    Call MD for:  As directed    Diet - low sodium heart healthy  As directed    Increase activity slowly  As directed        Medication List         albuterol 108 (90 BASE) MCG/ACT inhaler  Commonly known as:  PROVENTIL HFA;VENTOLIN HFA  Inhale 1 puff into the lungs every 6 (six) hours as needed for wheezing or shortness of breath.     albuterol-ipratropium 18-103 MCG/ACT inhaler  Commonly known as:  COMBIVENT  Inhale 2 puffs into the lungs 3 (three) times daily.     amLODipine 10 MG tablet  Commonly known as:  NORVASC  Take 1 tablet (10 mg total) by mouth daily.     cloNIDine 0.1 MG tablet  Commonly known as:  CATAPRES  Take 1 tablet (0.1 mg total) by mouth 2 (two) times daily.     folic acid 1 MG tablet  Commonly known as:  FOLVITE  Take 1 tablet (1 mg total) by mouth daily.     levofloxacin 750 MG tablet  Commonly known as:  LEVAQUIN  Take 1 tablet (750 mg total) by mouth daily.  Start taking on:  12/09/2013     multivitamin with minerals Tabs tablet  Take 1 tablet by mouth daily.     thiamine 100 MG tablet  Take 1 tablet (100 mg total) by mouth daily.       Follow-up Information   Follow up with [facility], MD On 12/24/2013. (3:00pm )    Specialty:  Pulmonary Disease   Contact information:   [address] [place] [address] [phone]       Follow up with Rosario Adie, MD. Schedule an appointment as soon as possible for a visit in 5 days.   Specialty:  Family Medicine   Contact information:   [address] [place] [address] [phone]        The results of significant diagnostics from this hospitalization (including imaging, microbiology, ancillary and laboratory) are listed below for reference.    Significant Diagnostic Studies: Dg Chest 2 View  12/03/2013   CLINICAL DATA:  Coughing up blood for 3 days.  Hemoptysis.  COPD.  EXAM: CHEST   2 VIEW  COMPARISON:  None.  FINDINGS: Hyperinflation is present compatible with emphysema. Bilateral pleural apical scarring. There is no airspace disease or pleural effusion. Cardiopericardial silhouette and mediastinal contours are within normal limits. Partially visualized cervical spondylosis and probable right carotid atherosclerosis.  IMPRESSION: Emphysema without acute cardiopulmonary disease.   Electronically Signed   By: Andreas Newport M.D.   On: 12/03/2013 07:06   Ct Chest W Contrast  12/03/2013   CLINICAL DATA:  Hemoptysis, tobacco abuse.  EXAM: CT CHEST WITH CONTRAST  TECHNIQUE: Multidetector CT imaging of the chest was performed during intravenous contrast administration.  CONTRAST:  OMNIPAQUE IOHEXOL 300 MG/ML  SOLN  COMPARISON:  None.  FINDINGS: Mediastinal lymph nodes are not enlarged by CT size criteria. No hilar or axillary adenopathy. Heart size normal. No pericardial effusion. Tiny hiatal hernia.  Centrilobular emphysema with bullous changes of the apices. Confluent nodular density in the right upper lobe measures 0.9 x 1.9 cm (image  16). Additional mild peribronchovascular nodularity and bronchial wall thickening in the right upper lobe. Linear scarring in the left upper lobe. Additional nodules measure up to 8 mm in the left upper lobe (image 34). No pleural fluid. Minimal debris is seen dependently in the lower trachea and within the right mainstem bronchus and bronchus intermedius.  Incidental imaging of the upper abdomen shows the visualized portions of the liver, gallbladder and adrenal glands to the grossly unremarkable. Low-attenuation lesions in the kidneys measure up to 10 mm on the right and are poorly evaluated in the absence of precontrast imaging and due to size. Visualized portions of the spleen, pancreas, stomach and bowel are otherwise grossly unremarkable. No upper abdominal adenopathy. No worrisome lytic or sclerotic lesions.  IMPRESSION: 1. Nodular consolidation in  the right upper lobe. Followup CT chest without contrast in 3 months is recommended in further evaluation, as primary bronchogenic carcinoma can have this appearance. 2. Additional scattered small pulmonary nodules measure up to 8 mm in left upper lobe. Continued attention on followup exams is warranted.   Electronically Signed   By: Leanna BattlesMelinda  Blietz M.D.   On: 12/03/2013 16:33   Dg Chest Port 1 View  12/06/2013   CLINICAL DATA:  Bronchoscopy.  EXAM: PORTABLE CHEST - 1 VIEW  COMPARISON:  CT CHEST W/CM dated 12/03/2013; DG CHEST 2 VIEW dated 12/03/2013  FINDINGS: Mediastinum hilar structures are normal. Focus of atelectasis and/or mass right mid lung. No pneumothorax. No pleural effusion. No pneumothorax. COPD. Heart size normal.  IMPRESSION: 1.  No pneumothorax post bronchoscopy.  2. Right mid lung atelectasis and/or mass again noted. This appears more prominent on today's exam.   Electronically Signed   By: Maisie Fushomas  Register   On: 12/06/2013 16:52    Microbiology: Recent Results (from the past 240 hour(s))  AFB CULTURE WITH SMEAR     Status: None   Collection Time    12/05/13  6:29 AM      Result Value Ref Range Status   Specimen Description SPUTUM   Final   Special Requests Normal   Final   Acid Fast Smear     Final   Value: NO ACID FAST BACILLI SEEN     Performed at [facility]   Culture     Final   Value: CULTURE WILL BE EXAMINED FOR 6 WEEKS BEFORE ISSUING A FINAL REPORT     Performed at [facility]   Report Status PENDING   Incomplete  AFB CULTURE WITH SMEAR     Status: None   Collection Time    12/06/13  3:28 PM      Result Value Ref Range Status   Specimen Description BRONCHIAL ALVEOLAR LAVAGE   Final   Special Requests NONE   Final   Acid Fast Smear     Final   Value: NO ACID FAST BACILLI SEEN     Performed at [facility]   Culture     Final   Value: CULTURE WILL BE EXAMINED FOR 6 WEEKS BEFORE ISSUING A FINAL REPORT     Performed at [facility]    Report Status PENDING   Incomplete  FUNGAL STAIN     Status: None   Collection Time    12/06/13  3:28 PM      Result Value Ref Range Status   Specimen Description BRONCHIAL ALVEOLAR LAVAGE   Final   Special Requests NONE   Final   Fungal Smear  Final   Value: NO YEAST OR FUNGAL ELEMENTS SEEN     Performed at [facility]   Report Status 12/07/2013 FINAL   Final  CULTURE, BAL-QUANTITATIVE     Status: None   Collection Time    12/06/13  3:28 PM      Result Value Ref Range Status   Specimen Description BRONCHIAL ALVEOLAR LAVAGE   Final   Special Requests NONE   Final   Gram Stain     Final   Value: MODERATE WBC PRESENT, PREDOMINANTLY PMN     NO SQUAMOUS EPITHELIAL CELLS SEEN     RARE GRAM POSITIVE COCCI IN PAIRS     Performed at [facility] Count PENDING   Incomplete   Culture     Final   Value: Non-Pathogenic Oropharyngeal-type Flora Isolated.     Performed at [facility]   Report Status PENDING   Incomplete  PNEUMOCYSTIS JIROVECI SMEAR BY DFA     Status: None   Collection Time    12/06/13  3:29 PM      Result Value Ref Range Status   Specimen Source-[facility] BRONCHIAL ALVEOLAR LAVAGE   Final   Pneumocystis jiroveci Ag NEGATIVE   Final   Comment: Performed at [facility]     Labs: Basic Metabolic Panel:  Recent Labs Lab 12/03/13 0619 12/04/13 0403 12/05/13 0420  NA 141 141 141  K 4.6 4.6 3.8  CL 104 104 102  CO2 26 22 27   GLUCOSE 113* 108* 104*  BUN 13 17 17   CREATININE 1.20 1.16 1.26  CALCIUM 9.7 9.1 9.1   Liver Function Tests:  Recent Labs Lab 12/03/13 0619 12/04/13 0403  AST 20 16  ALT 15 11  ALKPHOS 107 66  BILITOT 0.2* 0.3  PROT 7.4 6.4  ALBUMIN 3.8 3.2*   No results found for this basename: LIPASE, AMYLASE,  in the last 168 hours No results found for this basename: AMMONIA,  in the last 168 hours CBC:  Recent Labs Lab 12/03/13 0619 12/04/13 0403 12/05/13 0420  WBC 11.0* 12.0* 8.9   NEUTROABS 6.8  --   --   HGB 14.5 12.8* 12.9*  HCT 43.4 38.9* 39.5  MCV 89.1 88.6 90.0  PLT 318 312 296   Cardiac Enzymes: No results found for this basename: CKTOTAL, CKMB, CKMBINDEX, TROPONINI,  in the last 168 hours BNP: BNP (last 3 results) No results found for this basename: PROBNP,  in the last 8760 hours CBG: No results found for this basename: GLUCAP,  in the last 168 hours   Signed:  Elease Etienne, MD, FACP, [facility]. [facility] Pager [phone]  If 7PM-7AM, please contact night-coverage [email or web address] Password [facility] 12/08/2013, 11:45 AM  Addendum  [facility] called and advised that Combivent is not made anymore. Switched to Combivent Respimat 1 inhalation QID.  Elease Etienne, MD, FACP, [facility]. [facility] Pager [phone]  If 7PM-7AM, please contact night-coverage [email or web address] Password [email or web address] 12/08/2013, 3:59 PM

## 2013-12-08 NOTE — Progress Notes (Signed)
Patient to be discharged to home. Discharge instructions reviewed with patient. MATCH letter given to patient and instructions reviewed. PIV removed. Telemetry box #16 removed and returned to nurse's station.   Tiamarie Furnari Arvella Nigh, RN

## 2013-12-08 NOTE — Progress Notes (Signed)
CARE MANAGEMENT NOTE 12/08/2013  Patient:  Derek Conner, Derek Conner   Account Number:  [id number]  Date Initiated:  12/07/2013  Documentation initiated by:  Darlyne Russian  Subjective/Objective Assessment:   admitted with hemoptysis  medical hx: COPD, emphysema     Action/Plan:   progression of care and discharge planning   Anticipated DC Date:  12/10/2013   Anticipated DC Plan:        DC Planning Services  CM consult  MATCH Program      Choice offered to / List presented to:             Status of service:  Completed, signed off Medicare Important Message given?   (If response is "NO", the following Medicare IM given date fields will be blank) Date Medicare IM given:   Date Additional Medicare IM given:    Discharge Disposition:    Per UR Regulation:    If discussed at Long Length of Stay Meetings, dates discussed:    Comments:  12/08/2013   1130 Darlyne Russian RN, [place] [phone] MATCH lettergiven to patient at discharge .

## 2013-12-08 NOTE — Progress Notes (Signed)
PULMONARY / CRITICAL CARE MEDICINE Name: Derek Conner MRN: [id number] DOB: 1961/02/22    ADMISSION DATE:  12/03/2013 CONSULTATION DATE:  12/03/2013  REFERRING MD :  Rhona Leavens PRIMARY SERVICE:  TR[facility]  CHIEF COMPLAINT:  Hemoptysis   BRIEF PATIENT DESCRIPTION: 51 active smoker with over 40 pack-year history who presented on  to the ED with gross hemoptysis since 4/28. Reports up to two cups of blood per episode. PCCM asked to see for hemoptysis   SIGNIFICANT EVENTS / STUDIES:  5/1  Chest CT >Nodular consolidation in the right upper lobe. Additional scattered small pulmonary nodules measure up to 8 mm in left upper lobe. 12/04/13 Quantiferon Gold >>neg 5/4 FOB with BAL>>> neg cytology    LINES / TUBES:  CULTURES: AFB sputum 5/3 >>> BAL 5/4>>> normal flora>>> BAL AFB 5/4>>> smear neg>>> PCP 5/4>>> neg   ANTIBIOTICS: Rocephin/zmax 5/1 >5/2 Levaquin 5/2 (? Atypical TB/? CAP) >>>   SUBJECTIVE/OVERNIGHT/INTERVAL HX Feeling much better.  Still with cough but no further frank hemoptysis.  Feels significant improvement when using PRN duonebs. Wants something for home.   VITAL SIGNS: Temp:  [98.4 F (36.9 C)-98.6 F (37 C)] 98.6 F (37 C) (05/06 0526) Pulse Rate:  [81-89] 81 (05/06 0526) Resp:  [12-18] 18 (05/06 0526) BP: (132-141)/(66-84) 140/83 mmHg (05/06 0526) SpO2:  [97 %-99 %] 97 % (05/06 0526)  PHYSICAL EXAMINATION: General:  Middle aged white male, well nourished and in no acute distress Neuro:  Awake, oriented, no focal def  HEENT:  [place] Cardiovascular:  rrr Lungs:  Scattered rhonchi R>L, few exp wheeze Abdomen:  Soft, non-tender  Musculoskeletal:  Intact  Skin:  Intact   LABS:  PULMONARY No results found for this basename: PHART, PCO2, PCO2ART, PO2, PO2ART, HCO3, TCO2, O2SAT,  in the last 168 hours  CBC  Recent Labs Lab 12/03/13 0619 12/04/13 0403 12/05/13 0420  HGB 14.5 12.8* 12.9*  HCT 43.4 38.9* 39.5  WBC 11.0* 12.0* 8.9  PLT 318 312 296     COAGULATION  Recent Labs Lab 12/03/13 0619  INR 0.92    CARDIAC  No results found for this basename: TROPONINI,  in the last 168 hours No results found for this basename: PROBNP,  in the last 168 hours   CHEMISTRY  Recent Labs Lab 12/03/13 0619 12/04/13 0403 12/05/13 0420  NA 141 141 141  K 4.6 4.6 3.8  CL 104 104 102  CO2 26 22 27   GLUCOSE 113* 108* 104*  BUN 13 17 17   CREATININE 1.20 1.16 1.26  CALCIUM 9.7 9.1 9.1   Estimated Creatinine Clearance: 72.2 ml/min (by C-G formula based on Cr of 1.26).   LIVER  Recent Labs Lab 12/03/13 0619 12/04/13 0403  AST 20 16  ALT 15 11  ALKPHOS 107 66  BILITOT 0.2* 0.3  PROT 7.4 6.4  ALBUMIN 3.8 3.2*  INR 0.92  --      INFECTIOUS No results found for this basename: LATICACIDVEN, PROCALCITON,  in the last 168 hours   ENDOCRINE CBG (last 3)  No results found for this basename: GLUCAP,  in the last 72 hours    IMAGING x48h  Dg Chest Port 1 View  12/06/2013   CLINICAL DATA:  Bronchoscopy.  EXAM: PORTABLE CHEST - 1 VIEW  COMPARISON:  CT CHEST W/CM dated 12/03/2013; DG CHEST 2 VIEW dated 12/03/2013  FINDINGS: Mediastinum hilar structures are normal. Focus of atelectasis and/or mass right mid lung. No pneumothorax. No pleural effusion. No pneumothorax. COPD. Heart size normal.  IMPRESSION:  1.  No pneumothorax post bronchoscopy.  2. Right mid lung atelectasis and/or mass again noted. This appears more prominent on today's exam.   Electronically Signed   By: Maisie Fus  Register   On: 12/06/2013 16:52    IMPRESSION/ PLAN:   Hemoptysis  Hx COPD  RUL nodule with hemoptysis - ?PNA v malignancy v MTb v MAI.  S/p FOB with BAL 5/4.  No endobronchial lesions seen any lobe.  Cultures/AFB neg thus far and cytology neg.   PLAN -  Cont abx as above - rec 3 more days levaquin  F/u final culture data but AFB smear neg  May still need ENB transbronchial bx - likely f/u CT scan and decide further  Smoking cessation  Will try  combivent with PRN albuterol on d/c and f/u as outpt  No indication systemic steroids   Likely d/c per primary today.  Will arrange outpt pulm f/u for COPD and further w/u nodule, ?ENB.    Bernadene Person, NP 12/08/2013  10:07 AM Pager: [phone] or [phone]  *Care during the described time interval was provided by me and/or other providers on the critical care team. I have reviewed this patient's available data, including medical history, events of note, physical examination and test results as part of my evaluation.  May d/c home with F/U with Dr. Marchelle Gearing, inhalers adjusted and continue abx x3 more days for a total of 8.  PCCM will sign off, please call back if needed.  Alyson Reedy, M.D. [facility] Pulmonary/Critical Care Medicine. Pager: [phone]. After hours pager: [phone].

## 2013-12-09 LAB — CULTURE, BAL-QUANTITATIVE W GRAM STAIN

## 2013-12-09 LAB — CULTURE, BAL-QUANTITATIVE: Colony Count: 40000

## 2013-12-14 LAB — M. TUBERCULOSIS COMPLEX BY PCR: M TUBERCULOSISDIRECT: NOT DETECTED

## 2013-12-24 ENCOUNTER — Ambulatory Visit (INDEPENDENT_AMBULATORY_CARE_PROVIDER_SITE_OTHER): Payer: Self-pay | Admitting: Internal Medicine

## 2013-12-24 ENCOUNTER — Encounter: Payer: Self-pay | Admitting: Internal Medicine

## 2013-12-24 VITALS — BP 132/82 | HR 88 | Ht 72.0 in | Wt 182.6 lb

## 2013-12-24 DIAGNOSIS — K117 Disturbances of salivary secretion: Secondary | ICD-10-CM

## 2013-12-24 DIAGNOSIS — F17200 Nicotine dependence, unspecified, uncomplicated: Secondary | ICD-10-CM

## 2013-12-24 DIAGNOSIS — R042 Hemoptysis: Secondary | ICD-10-CM

## 2013-12-24 DIAGNOSIS — R682 Dry mouth, unspecified: Secondary | ICD-10-CM

## 2013-12-24 DIAGNOSIS — J449 Chronic obstructive pulmonary disease, unspecified: Secondary | ICD-10-CM

## 2013-12-24 DIAGNOSIS — R911 Solitary pulmonary nodule: Secondary | ICD-10-CM

## 2013-12-24 MED ORDER — IPRATROPIUM-ALBUTEROL 18-103 MCG/ACT IN AERO: 1.0000 | INHALATION_SPRAY | Freq: Four times a day (QID) | RESPIRATORY_TRACT | Status: DC

## 2013-12-24 NOTE — Progress Notes (Signed)
Subjective:    Patient ID: Derek Conner, male    DOB: 04-29-1961, 53 y.o.   MRN: [id number] PCP Rosario Adie, MD  HPI    OV 12/24/2013 Chief Complaint  Patient presents with  . Follow-up    HFU- d/c from [facility]. Pt states breathing has improved. Pt c/o dry cough. Denies hemoptysis and CP.      53 year old male smoker and daily beer consumer. Works as a Production designer, theatre/television/film at a trade show. Admitted early in 2015 with hemoptysis and right upper lobe nodule. Had extensive workup for pulmonary tuberculosis which was negative. THere is still concern that this nodule coud be malignant (transbronchial bx was deferred in April 2015 due to bleeding, only had BAL).  Was discharged on antibiotics and now presents for followup he did [name] is with him today at this visit. At this point in time he says he is remarkably better. Energy levels have almost returned to normal. Mild class I dyspnea and exertion present but is relieved by rest. He is compliant with his Combivent 4 times daily. He has significant dry mouth as a result. He is also reporting indigestion. But denies chest pain, edema, hemoptysis, orthopnea, paroxysmal nocturnal dyspnea, cough, wheezing. Overall well  He has relapsed with smoking. He is struggling to quit. He wants to quit. He failed Chantix in the past due to bad dreams. He does not want help with any medication. He says he'll try to quit on his own.  Review of Systems  Constitutional: Negative for fever and unexpected weight change.  HENT: Negative for congestion, dental problem, ear pain, nosebleeds, postnasal drip, rhinorrhea, sinus pressure, sneezing, sore throat and trouble swallowing.   Eyes: Negative for redness and itching.  Respiratory: Positive for cough. Negative for chest tightness, shortness of breath and wheezing.   Cardiovascular: Negative for palpitations and leg swelling.  Gastrointestinal: Negative for nausea and vomiting.  Genitourinary:  Negative for dysuria.  Musculoskeletal: Negative for joint swelling.  Skin: Negative for rash.  Neurological: Negative for headaches.  Hematological: Does not bruise/bleed easily.  Psychiatric/Behavioral: Negative for dysphoric mood. The patient is not nervous/anxious.    Current outpatient prescriptions:albuterol (PROVENTIL HFA;VENTOLIN HFA) 108 (90 BASE) MCG/ACT inhaler, Inhale 2 puffs into the lungs every 4 (four) hours as needed for wheezing or shortness of breath. , Disp: , Rfl: ;  albuterol-ipratropium (COMBIVENT) 18-103 MCG/ACT inhaler, Inhale 1 puff into the lungs every 6 (six) hours., Disp: , Rfl: ;  amLODipine (NORVASC) 10 MG tablet, Take 1 tablet (10 mg total) by mouth daily., Disp: 30 tablet, Rfl: 0 cloNIDine (CATAPRES) 0.1 MG tablet, Take 0.2 mg by mouth 2 (two) times daily., Disp: , Rfl: ;  folic acid (FOLVITE) 1 MG tablet, Take 1 tablet (1 mg total) by mouth daily., Disp: 30 tablet, Rfl: 0;  ibuprofen (ADVIL,MOTRIN) 200 MG tablet, Take 200 mg by mouth every 6 (six) hours as needed., Disp: , Rfl: ;  Multiple Vitamin (MULTIVITAMIN WITH MINERALS) TABS tablet, Take 1 tablet by mouth daily., Disp: 30 tablet, Rfl: 0 thiamine 100 MG tablet, Take 1 tablet (100 mg total) by mouth daily., Disp: 30 tablet, Rfl: 0     Objective:   Physical Exam  Nursing note and vitals reviewed. Constitutional: He is oriented to person, place, and time. He appears well-developed and well-nourished. No distress.  HENT:  Head: Normocephalic and atraumatic.  Right Ear: External ear normal.  Left Ear: External ear normal.  Mouth/Throat: Oropharynx is clear and moist. No oropharyngeal  exudate.  Eyes: Conjunctivae and EOM are normal. Pupils are equal, round, and reactive to light. Right eye exhibits no discharge. Left eye exhibits no discharge. No scleral icterus.  Neck: Normal range of motion. Neck supple. No JVD present. No tracheal deviation present. No thyromegaly present.  Cardiovascular: Normal rate,  regular rhythm and intact distal pulses.  Exam reveals no gallop and no friction rub.   No murmur heard. Pulmonary/Chest: Effort normal and breath sounds normal. No respiratory distress. He has no wheezes. He has no rales. He exhibits no tenderness.  Abdominal: Soft. Bowel sounds are normal. He exhibits no distension and no mass. There is no tenderness. There is no rebound and no guarding.  Musculoskeletal: Normal range of motion. He exhibits no edema and no tenderness.  Lymphadenopathy:    He has no cervical adenopathy.  Neurological: He is alert and oriented to person, place, and time. He has normal reflexes. No cranial nerve deficit. Coordination normal.  Skin: Skin is warm and dry. No rash noted. He is not diaphoretic. No erythema. No pallor.  Psychiatric: He has a normal mood and affect. His behavior is normal. Judgment and thought content normal.    Filed Vitals:   12/24/13 1512  Height: 6' (1.829 m)  Weight: 182 lb 9.6 oz (82.827 kg)   Filed Vitals:   12/24/13 1512  BP: 132/82  Pulse: 88  Height: 6' (1.829 m)  Weight: 182 lb 9.6 oz (82.827 kg)  SpO2: 97%         Assessment & Plan:

## 2013-12-24 NOTE — Patient Instructions (Signed)
#  Dry mouth   - likely due to inhaler but do not stop it  - try biotene tooth pase  #Lung nodule Right Upper lobe  - do CT chest wo contrast early July 2015 as followup  #Smoking  - try to quit  #COPD  - continue combivent 4 times daily and as needed - do PFT early July 2015  #Followup  - do PFT and CT chest wo contrast early July 2015  - return to see me or NP after that

## 2013-12-26 DIAGNOSIS — R682 Dry mouth, unspecified: Secondary | ICD-10-CM | POA: Insufficient documentation

## 2013-12-26 DIAGNOSIS — R911 Solitary pulmonary nodule: Secondary | ICD-10-CM | POA: Insufficient documentation

## 2013-12-26 DIAGNOSIS — J449 Chronic obstructive pulmonary disease, unspecified: Secondary | ICD-10-CM | POA: Insufficient documentation

## 2013-12-26 DIAGNOSIS — F17210 Nicotine dependence, cigarettes, uncomplicated: Secondary | ICD-10-CM | POA: Insufficient documentation

## 2013-12-26 NOTE — Assessment & Plan Note (Signed)
Resolved but needds CT chest to ensure rUL nodule has resolved

## 2013-12-26 NOTE — Assessment & Plan Note (Signed)
#  COPD  - continue combivent 4 times daily and as needed - do PFT early July 2015

## 2013-12-26 NOTE — Assessment & Plan Note (Signed)
Counseled about meds. He is intolerant to chanitx. Does not want to try othre options. Wants to quit on own. Encouraged to quit  3 min face to face counseling

## 2013-12-26 NOTE — Assessment & Plan Note (Signed)
Probably due to Combivent mdi. For now try biotene tooth paste

## 2013-12-26 NOTE — Assessment & Plan Note (Signed)
#  Lung nodule Right Upper lobe  - do CT chest wo contrast early July 2015 as followup  #Followup  - do PFT and CT chest wo contrast early July 2015  - return to see me or NP after that

## 2013-12-30 ENCOUNTER — Other Ambulatory Visit: Payer: Self-pay | Admitting: Emergency Medicine

## 2013-12-30 MED ORDER — IPRATROPIUM-ALBUTEROL 20-100 MCG/ACT IN AERS: 1.0000 | INHALATION_SPRAY | Freq: Four times a day (QID) | RESPIRATORY_TRACT | Status: DC

## 2013-12-30 NOTE — Telephone Encounter (Signed)
Faxed received from [facility] on [address] for a change in rx. During last OV on 12/24/2013, rx for combivent 18-103 was sent to pt's pharmacy and was denied d/t no longer carrying that dosage and will need to be changed to Combivent respimat 20-100. Rx has been sent to pt's preferred pharmacy. Nothing further is needed.

## 2014-01-17 LAB — AFB CULTURE WITH SMEAR (NOT AT ARMC)
ACID FAST SMEAR: NONE SEEN
Special Requests: NORMAL

## 2014-01-18 LAB — AFB CULTURE WITH SMEAR (NOT AT ARMC): ACID FAST SMEAR: NONE SEEN

## 2014-01-21 ENCOUNTER — Telehealth: Payer: Self-pay | Admitting: Internal Medicine

## 2014-01-21 MED ORDER — IPRATROPIUM-ALBUTEROL 20-100 MCG/ACT IN AERS: 1.0000 | INHALATION_SPRAY | Freq: Four times a day (QID) | RESPIRATORY_TRACT | Status: DC

## 2014-01-21 NOTE — Telephone Encounter (Signed)
Called spoke w/ pt. Aware 1 sample will be left for pick up. Nothing further needed

## 2014-02-14 ENCOUNTER — Ambulatory Visit (INDEPENDENT_AMBULATORY_CARE_PROVIDER_SITE_OTHER)
Admission: RE | Admit: 2014-02-14 | Discharge: 2014-02-14 | Disposition: A | Discharge status: Home / Self Care | Payer: Self-pay | Source: Ambulatory Visit | Attending: Internal Medicine | Admitting: Internal Medicine

## 2014-02-14 DIAGNOSIS — R911 Solitary pulmonary nodule: Secondary | ICD-10-CM

## 2014-02-15 ENCOUNTER — Ambulatory Visit (INDEPENDENT_AMBULATORY_CARE_PROVIDER_SITE_OTHER): Payer: Self-pay | Admitting: Internal Medicine

## 2014-02-15 ENCOUNTER — Encounter: Payer: Self-pay | Admitting: Internal Medicine

## 2014-02-15 ENCOUNTER — Encounter: Payer: Self-pay | Admitting: *Deleted

## 2014-02-15 VITALS — BP 180/110 | HR 86 | Ht 71.5 in | Wt 184.0 lb

## 2014-02-15 DIAGNOSIS — J449 Chronic obstructive pulmonary disease, unspecified: Secondary | ICD-10-CM

## 2014-02-15 DIAGNOSIS — R911 Solitary pulmonary nodule: Secondary | ICD-10-CM

## 2014-02-15 DIAGNOSIS — F17200 Nicotine dependence, unspecified, uncomplicated: Secondary | ICD-10-CM

## 2014-02-15 DIAGNOSIS — IMO0001 Reserved for inherently not codable concepts without codable children: Secondary | ICD-10-CM

## 2014-02-15 LAB — PULMONARY FUNCTION TEST
DL/VA % PRED: 68 %
DL/VA: 3.24 ml/min/mmHg/L
DLCO UNC: 25.32 ml/min/mmHg
DLCO unc % pred: 73 %
FEF 25-75 PRE: 0.28 L/s
FEF 25-75 Post: 0.64 L/sec
FEF2575-%Change-Post: 129 %
FEF2575-%PRED-PRE: 8 %
FEF2575-%Pred-Post: 18 %
FEV1-%Change-Post: 40 %
FEV1-%Pred-Post: 27 %
FEV1-%Pred-Pre: 19 %
FEV1-Post: 1.1 L
FEV1-Pre: 0.78 L
FEV1FVC-%Change-Post: -1 %
FEV1FVC-%Pred-Pre: 46 %
FEV6-%CHANGE-POST: 53 %
FEV6-%PRED-POST: 56 %
FEV6-%PRED-PRE: 36 %
FEV6-POST: 2.83 L
FEV6-PRE: 1.85 L
FEV6FVC-%CHANGE-POST: 7 %
FEV6FVC-%PRED-POST: 95 %
FEV6FVC-%Pred-Pre: 88 %
FVC-%Change-Post: 43 %
FVC-%PRED-POST: 59 %
FVC-%PRED-PRE: 41 %
FVC-POST: 3.1 L
FVC-Pre: 2.17 L
POST FEV1/FVC RATIO: 36 %
POST FEV6/FVC RATIO: 91 %
Pre FEV1/FVC ratio: 36 %
Pre FEV6/FVC Ratio: 85 %
RV % pred: 262 %
RV: 5.76 L
TLC % pred: 128 %
TLC: 9.36 L

## 2014-02-15 MED ORDER — TIOTROPIUM BROMIDE MONOHYDRATE 18 MCG IN CAPS: 18.0000 ug | ORAL_CAPSULE | Freq: Every day | RESPIRATORY_TRACT | Status: DC

## 2014-02-15 MED ORDER — FLUTICASONE-SALMETEROL 500-50 MCG/DOSE IN AEPB: 1.0000 | INHALATION_SPRAY | Freq: Two times a day (BID) | RESPIRATORY_TRACT | Status: DC

## 2014-02-15 MED ORDER — PREDNISONE 10 MG PO TABS: ORAL_TABLET | ORAL | Status: DC

## 2014-02-15 NOTE — Progress Notes (Signed)
Subjective:    Patient ID: Derek Conner, male    DOB: Jan 12, 1961, 53 y.o.   MRN: [id number]  HPI  PCP Rosario Adie, MD  HPI    OV 12/24/2013 Chief Complaint  Patient presents with  . Follow-up    HFU- d/c from [facility]. Pt states breathing has improved. Pt c/o dry cough. Denies hemoptysis and CP.      53 year old male smoker and daily beer consumer. Works as a Production designer, theatre/television/film at a trade show. Admitted early in 2015 with hemoptysis and right upper lobe nodule. Had extensive workup for pulmonary tuberculosis which was negative. THere is still concern that this nodule coud be malignant (transbronchial bx was deferred in April 2015 due to bleeding, only had BAL).  Was discharged on antibiotics and now presents for followup he did [name] is with him today at this visit. At this point in time he says he is remarkably better. Energy levels have almost returned to normal. Mild class I dyspnea and exertion present but is relieved by rest. He is compliant with his Combivent 4 times daily. He has significant dry mouth as a result. He is also reporting indigestion. But denies chest pain, edema, hemoptysis, orthopnea, paroxysmal nocturnal dyspnea, cough, wheezing. Overall well  He has relapsed with smoking. He is struggling to quit. He wants to quit. He failed Chantix in the past due to bad dreams. He does not want help with any medication. He says he'll try to quit on his own.    OV 02/15/2014  Chief Complaint  Patient presents with  . Follow-up    Pt states his SOB has worsened since last OV. Pt states he has had to use his rescue inhaler in between using the combivent he needs to use the rescue inhaler. C/o coughing with intermittent white mucous production and c/o chest tightness when he is due for another dose of combivent.    Followup. Presents with a significant other  - Obstructive lung disease not otherwise specified:  Pulmonary function test shows FEV1 of 0.80 the/19%  with an FVC of 2.2 L/41% and a ratio of 36. This is consistent with very severe severe obstructive lung disease. Postbronchodilator FEV1 improved significantly to 1.1 L/27% showing bronchodilator reversibility. Total lung capacity is 9.3 /128% and residual volume  was 5.80 the/plan 62% consistent with significant air trapping and hyperinflation DLCO is 24.3/73% and is only mildly impaired. Findings are consistent with severe persistent asthma with significant bronchodilator reversibility and perhaps mild emphysema that is associated with this  - He now tells me that around age 59 he was diagnosed with asthma. He is always use only albuterol as needed but for a few years several years ago he did use Advair on a regular basis and this did help. He did stop the Advair after he is out of health insurance following a divorce. He remains uninsured. Now for the past 2-3 weeks he's had increasing symptoms blood in the chest tightness, cough and shortness of breath and occasional wheezing. His Combivent efficacy does not last more than a few hours and he is residing to increased albuterol use. He is also having nocturnal awakenings.   - Right upper lobe lung nodule: The CT scan of the chest July 2013 is reviewed below and this shows the lung nodule is stable at the 3 month month time point. He continues to do a hemoptysis that he once had. The radiologist has recommended followup scan in 6 months but I feel  that given his age and smoking history in size the scanning to be done at 3 months. His girlfriend and he reluctant about the frequency of scans do to radiation risk. Therefore they have opted for serologic testing for pretest probability of lung cancer and indeterminate nodule; the test called INDI  - Smoking: He continues to smoke. He knows he needs to quit but is unable to   reports that he has been smoking Cigarettes.  He has a 37 pack-year smoking history. He does not have any smokeless tobacco history on  file.      Ct Chest Wo Contrast  02/14/2014   CLINICAL DATA:  Follow-up right upper lobe pulmonary nodule  EXAM: CT CHEST WITHOUT CONTRAST  TECHNIQUE: Multidetector CT imaging of the chest was performed following the standard protocol without IV contrast.  COMPARISON:  12/03/2013  FINDINGS: 8 x 18 mm irregular posterior right upper lobe nodular opacity (series 3/ image 16), previously 9 x 19 mm, unchanged.  Scattered areas of bronchiectasis with bronchial wall thickening, suggesting chronic bronchitis, with upper lobe predominant scarring. Biapical pleural parenchymal scarring.  Moderate centrilobular emphysematous changes.  No pleural effusion or pneumothorax.  Visualized thyroid is unremarkable.  The heart is normal in size.  No pericardial effusion.  Small mediastinal lymph nodes which do not meet pathologic CT size criteria.  Visualized upper abdomen is notable for small gastrohepatic and portacaval lymph nodes which do not meet pathologic CT size criteria.  Visualized osseous structures are within normal limits.  IMPRESSION: 8 x 18 mm irregular posterior right upper lobe nodular opacity, unchanged. Three month stability has been demonstrated. Consider follow-up CT chest in 6 months.  Underlying moderate emphysematous changes.  This recommendation follows the consensus statement: Guidelines for Management of Small Pulmonary Nodules Detected on CT Scans: A Statement from the Fleischner Society as published in Radiology 2005; 237:395-400.   Electronically Signed   By: Charline Bills M.D.   On: 02/14/2014 11:08     Review of Systems  Constitutional: Negative for fever and unexpected weight change.  HENT: Positive for congestion. Negative for dental problem, ear pain, nosebleeds, postnasal drip, rhinorrhea, sinus pressure, sneezing, sore throat and trouble swallowing.   Eyes: Negative for redness and itching.  Respiratory: Positive for cough, chest tightness and shortness of breath. Negative for  wheezing.   Cardiovascular: Negative for palpitations and leg swelling.  Gastrointestinal: Negative for nausea and vomiting.  Genitourinary: Negative for dysuria.  Musculoskeletal: Negative for joint swelling.  Skin: Negative for rash.  Neurological: Positive for headaches.  Hematological: Does not bruise/bleed easily.  Psychiatric/Behavioral: Negative for dysphoric mood. The patient is not nervous/anxious.        Objective:   Physical Exam  Nursing note and vitals reviewed. Constitutional: He is oriented to person, place, and time. He appears well-developed and well-nourished. No distress.  HENT:  Head: Normocephalic and atraumatic.  Right Ear: External ear normal.  Left Ear: External ear normal.  Mouth/Throat: Oropharynx is clear and moist. No oropharyngeal exudate.  Eyes: Conjunctivae and EOM are normal. Pupils are equal, round, and reactive to light. Right eye exhibits no discharge. Left eye exhibits no discharge. No scleral icterus.  Neck: Normal range of motion. Neck supple. No JVD present. No tracheal deviation present. No thyromegaly present.  Cardiovascular: Normal rate, regular rhythm and intact distal pulses.  Exam reveals no gallop and no friction rub.   No murmur heard. Pulmonary/Chest: Effort normal and breath sounds normal. No respiratory distress. He has no wheezes. He  has no rales. He exhibits no tenderness.  Mild purse lip breathing + Barrell chest +  Abdominal: Soft. Bowel sounds are normal. He exhibits no distension and no mass. There is no tenderness. There is no rebound and no guarding.  Musculoskeletal: Normal range of motion. He exhibits no edema and no tenderness.  Lymphadenopathy:    He has no cervical adenopathy.  Neurological: He is alert and oriented to person, place, and time. He has normal reflexes. No cranial nerve deficit. Coordination normal.  Skin: Skin is warm and dry. No rash noted. He is not diaphoretic. No erythema. No pallor.  Psychiatric: He  has a normal mood and affect. His behavior is normal. Judgment and thought content normal.     Filed Vitals:   02/15/14 1225  BP: 180/110  Pulse: 86  Height: 5' 11.5" (1.816 m)  Weight: 184 lb (83.462 kg)  SpO2: 97%        Assessment & Plan:  #Obstructive lung disease - this is very severe and mixture of asthma and some early emphysema  - Please take Take prednisone 40mg  once daily x 3 days, then 30mg  once daily x 3 days, then 20mg  once daily x 3 days, then prednisone 10mg  once daily  x 3 days and stop - start spiriva once daily - start advair 500/50 1 puff twice daily - use albuterol or combivent now as needed  #Right Upper lobe lung nodule  - due to concern of radiation risk will refer you to INDI blood test;  - based on review of these results in several weeks will decide timing of next scan  #Health Management  - [facility] will refer you to healthserve program of [place]  #Smoking  - please work on quitting as hard as this can be   #FOllowup  4 weeks with my NP Spirometry at followup with NP

## 2014-02-15 NOTE — [facility] Oncology Navigator Note (Unsigned)
Faxed Indi request forms per Dr. Marchelle Gearing

## 2014-02-15 NOTE — Progress Notes (Signed)
PFT done today. 

## 2014-02-15 NOTE — Patient Instructions (Signed)
#  Obstructive lung disease - this is very severe and mixture of asthma and some early emphysema  - Please take Take prednisone 40mg  once daily x 3 days, then 30mg  once daily x 3 days, then 20mg  once daily x 3 days, then prednisone 10mg  once daily  x 3 days and stop - start spiriva once daily - start advair 500/50 1 puff twice daily - use albuterol or combivent now as needed  #Right Upper lobe lung nodule  - due to concern of radiation risk will refer you to INDI blood test;  - based on review of these results in several weeks will decide timing of next scan  #Health Management  - [facility] will refer you to healthserve program of    #FOllowup  4 weeks with my NP Spirometry at followup with NP

## 2014-03-07 NOTE — Assessment & Plan Note (Signed)
Advised to quit.  

## 2014-03-07 NOTE — Assessment & Plan Note (Signed)
#  Right Upper lobe lung nodule  - due to concern of radiation risk will refer you to INDI blood test;  - based on review of these results in several weeks will decide timing of next scan

## 2014-03-07 NOTE — Assessment & Plan Note (Signed)
#  Obstructive lung disease - this is very severe and mixture of asthma and some early emphysema  - Please take Take prednisone 40mg  once daily x 3 days, then 30mg  once daily x 3 days, then 20mg  once daily x 3 days, then prednisone 10mg  once daily  x 3 days and stop - start spiriva once daily - start advair 500/50 1 puff twice daily - use albuterol or combivent now as needed  #Health Management  - [facility] will refer you to healthserve program of [place]   #FOllowup  4 weeks with my NP Spirometry at followup with NP

## 2014-03-08 ENCOUNTER — Telehealth: Payer: Self-pay | Admitting: Internal Medicine

## 2014-03-08 DIAGNOSIS — R911 Solitary pulmonary nodule: Secondary | ICD-10-CM

## 2014-03-08 MED ORDER — TIOTROPIUM BROMIDE MONOHYDRATE 18 MCG IN CAPS: 18.0000 ug | ORAL_CAPSULE | Freq: Every day | RESPIRATORY_TRACT | Status: DC

## 2014-03-08 NOTE — Telephone Encounter (Signed)
Please tell him to apply for medicaid if he is eligible. Also, I advised him to join the free clinic program (or healthserve) at [facility]. Till them samples only

## 2014-03-08 NOTE — Telephone Encounter (Signed)
Caller aware that I have placed samples at front but also sent message to MR to address as patient does not qualify for Pt Assistance.

## 2014-03-08 NOTE — Telephone Encounter (Signed)
Spoke with Derek Conner-she is aware to have patient go through [facility] or free clinic with [facility] in the meantime. Nothing more needed at this time.

## 2014-03-08 NOTE — Telephone Encounter (Signed)
LEt Derek Conner August 22, 1960 know that INDI Blood test 02/16/14 was "indeterminate" for lung cancer; which means we do not know anymore than before blood test. So, based on this CT chest wo contrast in 3 months (can order as low dose protocol bu for followup of Right upper lobe nodule). Ensure OV. CT can be 3 months from prior CT chest

## 2014-03-09 NOTE — Telephone Encounter (Signed)
Called and spoke to pt. Informed pt of results and recs per MR. CT order placed to be done in 05/2014. Pt aware that someone will be contacting him to schedule test. Pt aware of upcoming appt with TP on 8/27. Nothing further needed.

## 2014-03-11 ENCOUNTER — Ambulatory Visit: Payer: Self-pay | Attending: Internal Medicine

## 2014-03-22 ENCOUNTER — Telehealth: Payer: Self-pay | Admitting: Internal Medicine

## 2014-03-22 MED ORDER — FLUTICASONE-SALMETEROL 500-50 MCG/DOSE IN AEPB: 1.0000 | INHALATION_SPRAY | Freq: Two times a day (BID) | RESPIRATORY_TRACT | Status: DC

## 2014-03-22 NOTE — Telephone Encounter (Signed)
Called, spoke with Lurena Joiner, family member. Requesting Advair 500 samples. States medication will be $300 at pharm - pt does not have insurance. I placed 2 samples at front for pick up along with a pt assistance application for advair through [employer]. Lurena Joiner aware and is aware to bring application back to office with required documents once completed.   She verbalized understanding, was very appreciative of this, and voiced no further questions or concerns at this time.

## 2014-03-29 ENCOUNTER — Telehealth: Payer: Self-pay | Admitting: Internal Medicine

## 2014-03-29 NOTE — Telephone Encounter (Signed)
Pt returned call. MR spoke with pt. Nothing further needed.

## 2014-03-29 NOTE — Telephone Encounter (Signed)
Please get hold of him for me Derek Conner  Oct 17, 1960 . HE cannot afford mdi and might be better off in a research study

## 2014-03-29 NOTE — Telephone Encounter (Signed)
lmtcb on home and mobile number.  MR will need to speak personally with pt.

## 2014-03-31 ENCOUNTER — Encounter: Payer: Self-pay | Admitting: Adult Health

## 2014-03-31 ENCOUNTER — Ambulatory Visit (INDEPENDENT_AMBULATORY_CARE_PROVIDER_SITE_OTHER): Payer: Self-pay | Admitting: Adult Health

## 2014-03-31 ENCOUNTER — Other Ambulatory Visit: Payer: Self-pay

## 2014-03-31 VITALS — BP 134/72 | HR 91 | Temp 98.5°F | Ht 71.5 in | Wt 182.2 lb

## 2014-03-31 DIAGNOSIS — J449 Chronic obstructive pulmonary disease, unspecified: Secondary | ICD-10-CM

## 2014-03-31 DIAGNOSIS — J439 Emphysema, unspecified: Secondary | ICD-10-CM

## 2014-03-31 DIAGNOSIS — J438 Other emphysema: Secondary | ICD-10-CM

## 2014-03-31 NOTE — Patient Instructions (Signed)
#  Obstructive lung disease - this is very severe and mixture of asthma and some early emphysema - Continue on  spiriva once daily - Continue on advair 500/50 1 puff twice daily - Use albuterol or combivent now as needed -Return paperwork for patient assistance program.  -Alpha one lab test today .   #FOllowup Dr. Marchelle Gearing in 2 months and As needed

## 2014-03-31 NOTE — Progress Notes (Signed)
Subjective:    Patient ID: Derek Conner, male    DOB: Jan 12, 1961, 53 y.o.   MRN: [id number]  HPI  PCP Derek Adie, MD  HPI    OV 12/24/2013 Chief Complaint  Patient presents with  . Follow-up    HFU- d/c from [facility]. Pt states breathing has improved. Pt c/o dry cough. Denies hemoptysis and CP.      53 year old male smoker and daily beer consumer. Works as a Production designer, theatre/television/film at a trade show. Admitted early in 2015 with hemoptysis and right upper lobe nodule. Had extensive workup for pulmonary tuberculosis which was negative. THere is still concern that this nodule coud be malignant (transbronchial bx was deferred in April 2015 due to bleeding, only had BAL).  Was discharged on antibiotics and now presents for followup he did his girlfriend Derek Conner is with him today at this visit. At this point in time he says he is remarkably better. Energy levels have almost returned to normal. Mild class I dyspnea and exertion present but is relieved by rest. He is compliant with his Combivent 4 times daily. He has significant dry mouth as a result. He is also reporting indigestion. But denies chest pain, edema, hemoptysis, orthopnea, paroxysmal nocturnal dyspnea, cough, wheezing. Overall well  He has relapsed with smoking. He is struggling to quit. He wants to quit. He failed Chantix in the past due to bad dreams. He does not want help with any medication. He says he'll try to quit on his own.    OV 02/15/2014  Chief Complaint  Patient presents with  . Follow-up    Pt states his SOB has worsened since last OV. Pt states he has had to use his rescue inhaler in between using the combivent he needs to use the rescue inhaler. C/o coughing with intermittent white mucous production and c/o chest tightness when he is due for another dose of combivent.    Followup. Presents with a significant other  - Obstructive lung disease not otherwise specified:  Pulmonary function test shows FEV1 of 0.80 the/19%  with an FVC of 2.2 L/41% and a ratio of 36. This is consistent with very severe severe obstructive lung disease. Postbronchodilator FEV1 improved significantly to 1.1 L/27% showing bronchodilator reversibility. Total lung capacity is 9.3 /128% and residual volume  was 5.80 the/plan 62% consistent with significant air trapping and hyperinflation DLCO is 24.3/73% and is only mildly impaired. Findings are consistent with severe persistent asthma with significant bronchodilator reversibility and perhaps mild emphysema that is associated with this  - He now tells me that around age 59 he was diagnosed with asthma. He is always use only albuterol as needed but for a few years several years ago he did use Advair on a regular basis and this did help. He did stop the Advair after he is out of health insurance following a divorce. He remains uninsured. Now for the past 2-3 weeks he's had increasing symptoms blood in the chest tightness, cough and shortness of breath and occasional wheezing. His Combivent efficacy does not last more than a few hours and he is residing to increased albuterol use. He is also having nocturnal awakenings.   - Right upper lobe lung nodule: The CT scan of the chest July 2013 is reviewed below and this shows the lung nodule is stable at the 3 month month time point. He continues to do a hemoptysis that he once had. The radiologist has recommended followup scan in 6 months but I feel  that given his age and smoking history in size the scanning to be done at 3 months. His girlfriend and he reluctant about the frequency of scans do to radiation risk. Therefore they have opted for serologic testing for pretest probability of lung cancer and indeterminate nodule; the test called INDI  - Smoking: He continues to smoke. He knows he needs to quit but is unable to   reports that he has been smoking Cigarettes.  He has a 37 pack-year smoking history. He does not have any smokeless tobacco history on  file.   Ct Chest Wo Contrast  02/14/2014   CLINICAL DATA:  Follow-up right upper lobe pulmonary nodule  EXAM: CT CHEST WITHOUT CONTRAST  TECHNIQUE: Multidetector CT imaging of the chest was performed following the standard protocol without IV contrast.  COMPARISON:  12/03/2013  FINDINGS: 8 x 18 mm irregular posterior right upper lobe nodular opacity (series 3/ image 16), previously 9 x 19 mm, unchanged.  Scattered areas of bronchiectasis with bronchial wall thickening, suggesting chronic bronchitis, with upper lobe predominant scarring. Biapical pleural parenchymal scarring.  Moderate centrilobular emphysematous changes.  No pleural effusion or pneumothorax.  Visualized thyroid is unremarkable.  The heart is normal in size.  No pericardial effusion.  Small mediastinal lymph nodes which do not meet pathologic CT size criteria.  Visualized upper abdomen is notable for small gastrohepatic and portacaval lymph nodes which do not meet pathologic CT size criteria.  Visualized osseous structures are within normal limits.  IMPRESSION: 8 x 18 mm irregular posterior right upper lobe nodular opacity, unchanged. Three month stability has been demonstrated. Consider follow-up CT chest in 6 months.  Underlying moderate emphysematous changes.  This recommendation follows the consensus statement: Guidelines for Management of Small Pulmonary Nodules Detected on CT Scans: A Statement from the Fleischner Society as published in Radiology 2005; 237:395-400.   Electronically Signed   By: Derek Conner M.D.   On: 02/14/2014 11:08    03/31/2014 Follow up Asthma /COPD -Severe  Patient returns for a 6 week followup for severe COPD, and asthma, and an active smoker. Last visit with a COPD exacerbation treated with a steroid taper. Patient was started on Advair and Spiriva. Patient returns for reports that he is feeling much improved. Feels that his breathing has improved at least 80% and feels that his shortness, of breath is  significantly better. Last visit PFTs show severe airflow obstruction with an FEV1 of 19% with significant bronchodilator response with a 40% improvement. Today. Spirometry was repeated and showed persistent, very severe obstruction, with an FEV1 at 26%, and mid-flows at 9%. We discussed smoking cessation. He does have a family history of emphysema, he agreed to alpha-1 testing. Today.  Unfortunately, patient does not have medical insurance or prescription coverage. He is currently looking into some community programs. He has paperwork for a medication assistance program through Spiriva and Advair companies.   He denies any hemoptysis, orthopnea, PND, leg swelling, or edema.   Review of Systems  Constitutional: Negative for fever and unexpected weight change.  HENT:   Negative for dental problem, ear pain, nosebleeds, postnasal drip, rhinorrhea, sinus pressure, sneezing, sore throat and trouble swallowing.   Eyes: Negative for redness and itching.  Respiratory: Positive for cough,  and shortness of breath. Negative for wheezing.   Cardiovascular: Negative for palpitations and leg swelling.  Gastrointestinal: Negative for nausea and vomiting.  Genitourinary: Negative for dysuria.  Musculoskeletal: Negative for joint swelling.  Skin: Negative for rash.  Neurological: no  seizures  Hematological: Does not bruise/bleed easily.  Psychiatric/Behavioral: Negative for dysphoric mood. The patient is not nervous/anxious.        Objective:   Physical Exam  Nursing note and vitals reviewed. Constitutional: He is oriented to person, place, and time. He appears well-developed and well-nourished. No distress.  HENT:  Head: Normocephalic and atraumatic.  Right Ear: External ear normal.  Left Ear: External ear normal.  Mouth/Throat: Oropharynx is clear and moist. No oropharyngeal exudate.  Eyes: Conjunctivae and EOM are normal. Pupils are equal, round, and reactive to light. Right eye exhibits no  discharge. Left eye exhibits no discharge. No scleral icterus.  Neck: Normal range of motion. Neck supple. No JVD present. No tracheal deviation present. No thyromegaly present.  Cardiovascular: Normal rate, regular rhythm and intact distal pulses.  Exam reveals no gallop and no friction rub.   No murmur heard. Pulmonary/Chest: Effort normal and breath sounds normal. No respiratory distress. He has no wheezes. He has no rales. He exhibits no tenderness.  Barrell chest +  Abdominal: Soft. Bowel sounds are normal. He exhibits no distension and no mass. There is no tenderness. There is no rebound and no guarding.  Musculoskeletal: Normal range of motion. He exhibits no edema and no tenderness.  Lymphadenopathy:    He has no cervical adenopathy.  Neurological: He is alert and oriented to person, place, and time. He has normal reflexes. No cranial nerve deficit. Coordination normal.  Skin: Skin is warm and dry. No rash noted. He is not diaphoretic. No erythema. No pallor.  Psychiatric: He has a normal mood and affect. His behavior is normal. Judgment and thought content normal.          Assessment & Plan:

## 2014-03-31 NOTE — Assessment & Plan Note (Addendum)
Very severe COPD, GOLD D , w/ asthma component in an active smoker Patient encouraged on smoking cessation We discussed importance of immunizations-he declines at this time We'll discuss pulmonary rehabilitation on return. Alpha-1 testing. Today  Plan  Continue on  spiriva once daily - Continue on advair 500/50 1 puff twice daily - Use albuterol or combivent now as needed -Return paperwork for patient assistance program.  -Alpha one lab test today .

## 2014-04-06 ENCOUNTER — Encounter: Payer: Self-pay | Admitting: Internal Medicine

## 2014-04-06 LAB — ALPHA-1 ANTITRYPSIN PHENOTYPE: A-1 Antitrypsin: 119 mg/dL (ref 83–199)

## 2014-04-07 ENCOUNTER — Encounter: Payer: Self-pay | Admitting: Adult Health

## 2014-04-08 NOTE — Progress Notes (Signed)
Quick Note:  Called and spoke to [facility], [employer] and informed her of the results. Derek Conner stated she would relay message to pt. Nothing further needed. ______

## 2014-04-13 ENCOUNTER — Telehealth: Payer: Self-pay | Admitting: Internal Medicine

## 2014-04-13 MED ORDER — FLUTICASONE-SALMETEROL 500-50 MCG/DOSE IN AEPB: 1.0000 | INHALATION_SPRAY | Freq: Two times a day (BID) | RESPIRATORY_TRACT | Status: DC

## 2014-04-13 MED ORDER — TIOTROPIUM BROMIDE MONOHYDRATE 18 MCG IN CAPS: 18.0000 ug | ORAL_CAPSULE | Freq: Every day | RESPIRATORY_TRACT | Status: DC

## 2014-04-13 NOTE — Telephone Encounter (Signed)
Verified pt is using Advair 500 and Spiriva on med list 1 sample of each obtained and given to Lurena Joiner in the lobby Samples documented per protocol Paperwork for patient assistance from [place] placed in MR's lookat per [place]  Will route message to [place] and MR to ensure follow up on the form for patient assistance

## 2014-04-19 MED ORDER — FLUTICASONE-SALMETEROL 500-50 MCG/DOSE IN AEPB: 1.0000 | INHALATION_SPRAY | Freq: Two times a day (BID) | RESPIRATORY_TRACT | Status: DC

## 2014-04-19 NOTE — Telephone Encounter (Signed)
Forms have been faxed to [phone], patient assistance for advair.

## 2014-04-19 NOTE — Telephone Encounter (Signed)
Rx will need to be signed by MR when he returns to office on 9/21 and then faxed to pt assistance. Pt aware and knows there are samples to be left up front to be picked up.

## 2014-04-21 NOTE — Telephone Encounter (Signed)
Rx signed by MR and faxed along with forms to [employer] pt assistance for advair. Pt aware. Will await decision.

## 2014-04-27 ENCOUNTER — Telehealth: Payer: Self-pay | Admitting: Internal Medicine

## 2014-04-27 NOTE — Telephone Encounter (Signed)
I called spoke with pt. Aware no samples at this time. Nothing further needed

## 2014-04-28 NOTE — Telephone Encounter (Signed)
Returning call. [phone]

## 2014-04-28 NOTE — Telephone Encounter (Signed)
Robynn Pane, do you know an update on this?

## 2014-04-28 NOTE — Telephone Encounter (Signed)
Called [place] [facility] [employer] with [employer] pt assistance and spoke with Gerlene Burdock. I was informed the pt is not eligable for the pt assistance for Advair d/t his annual income. Richard stated they sent pt a denial letter and stated they are unable to send the office a denial letter. Pt ID #: [id number].  LMTCB for pt to inform him of the decision.  WCB.

## 2014-05-02 NOTE — Telephone Encounter (Signed)
Called and spoke with pt and he is aware of him not being eligible for the pt assistance for the advair.  Nothing further is needed.

## 2014-05-24 ENCOUNTER — Telehealth: Payer: Self-pay | Admitting: Internal Medicine

## 2014-05-24 MED ORDER — MOMETASONE FURO-FORMOTEROL FUM 100-5 MCG/ACT IN AERO: 2.0000 | INHALATION_SPRAY | Freq: Two times a day (BID) | RESPIRATORY_TRACT | Status: DC

## 2014-05-24 NOTE — Telephone Encounter (Signed)
We do not have any samples of advair at this time. I called spoke w/ Lurena Joiner. Made her aware. She reports she was told by the pharmacists that symbicort would be a good alternative. Please advise MR thanks

## 2014-05-24 NOTE — Telephone Encounter (Signed)
LMTCB x 1 

## 2014-05-24 NOTE — Telephone Encounter (Signed)
symbicort 80/4.5 2 puff bid is good; give samples of that or any dulera or any breo - because he has no money

## 2014-05-24 NOTE — Telephone Encounter (Signed)
Spoke with Lurena Joiner 1 sample of Dulera 100/4.5 up front for pick up  She will call to let us know how he is doing with this before it runs out

## 2014-05-30 ENCOUNTER — Inpatient Hospital Stay: Admission: RE | Admit: 2014-05-30 | Payer: Self-pay | Source: Ambulatory Visit

## 2014-05-31 ENCOUNTER — Ambulatory Visit: Payer: Self-pay | Admitting: Internal Medicine

## 2014-07-04 ENCOUNTER — Telehealth: Payer: Self-pay | Admitting: Internal Medicine

## 2014-07-04 MED ORDER — TIOTROPIUM BROMIDE MONOHYDRATE 18 MCG IN CAPS: 18.0000 ug | ORAL_CAPSULE | Freq: Every day | RESPIRATORY_TRACT | Status: DC

## 2014-07-04 MED ORDER — FLUTICASONE-SALMETEROL 500-50 MCG/DOSE IN AEPB: 1.0000 | INHALATION_SPRAY | Freq: Two times a day (BID) | RESPIRATORY_TRACT | Status: DC

## 2014-07-04 NOTE — Telephone Encounter (Signed)
Pt has an appt on 07-08-14 and is calling asking for samples. Samples left at front. Pt aware. Carron Curie, CMA

## 2014-07-06 ENCOUNTER — Ambulatory Visit (INDEPENDENT_AMBULATORY_CARE_PROVIDER_SITE_OTHER)
Admission: RE | Admit: 2014-07-06 | Discharge: 2014-07-06 | Disposition: A | Discharge status: Home / Self Care | Payer: Self-pay | Source: Ambulatory Visit | Attending: Internal Medicine | Admitting: Internal Medicine

## 2014-07-06 DIAGNOSIS — R911 Solitary pulmonary nodule: Secondary | ICD-10-CM

## 2014-07-08 ENCOUNTER — Ambulatory Visit (INDEPENDENT_AMBULATORY_CARE_PROVIDER_SITE_OTHER): Payer: Self-pay | Admitting: Internal Medicine

## 2014-07-08 ENCOUNTER — Encounter: Payer: Self-pay | Admitting: Internal Medicine

## 2014-07-08 VITALS — BP 158/96 | HR 83 | Ht 71.0 in | Wt 185.0 lb

## 2014-07-08 DIAGNOSIS — R911 Solitary pulmonary nodule: Secondary | ICD-10-CM

## 2014-07-08 DIAGNOSIS — F17200 Nicotine dependence, unspecified, uncomplicated: Secondary | ICD-10-CM

## 2014-07-08 DIAGNOSIS — R0789 Other chest pain: Secondary | ICD-10-CM

## 2014-07-08 DIAGNOSIS — J441 Chronic obstructive pulmonary disease with (acute) exacerbation: Secondary | ICD-10-CM

## 2014-07-08 DIAGNOSIS — Z720 Tobacco use: Secondary | ICD-10-CM

## 2014-07-08 MED ORDER — PREDNISONE 10 MG PO TABS: ORAL_TABLET | ORAL | Status: DC

## 2014-07-08 MED ORDER — LEVOFLOXACIN 500 MG PO TABS: 500.0000 mg | ORAL_TABLET | Freq: Every day | ORAL | Status: DC

## 2014-07-08 NOTE — Progress Notes (Signed)
OV 12/24/2013 Chief Complaint  Patient presents with  . Follow-up    HFU- d/c from [facility]. Pt states breathing has improved. Pt c/o dry cough. Denies hemoptysis and CP.      53 year old male smoker and daily beer consumer. Works as a Production designer, theatre/television/film at a trade show. Admitted early in 2015 with hemoptysis and right upper lobe nodule. Had extensive workup for pulmonary tuberculosis which was negative. THere is still concern that this nodule coud be malignant (transbronchial bx was deferred in April 2015 due to bleeding, only had BAL).  Was discharged on antibiotics and now presents for followup he did [name] is with him today at this visit. At this point in time he says he is remarkably better. Energy levels have almost returned to normal. Mild class I dyspnea and exertion present but is relieved by rest. He is compliant with his Combivent 4 times daily. He has significant dry mouth as a result. He is also reporting indigestion. But denies chest pain, edema, hemoptysis, orthopnea, paroxysmal nocturnal dyspnea, cough, wheezing. Overall well  He has relapsed with smoking. He is struggling to quit. He wants to quit. He failed Chantix in the past due to bad dreams. He does not want help with any medication. He says he'll try to quit on his own.    OV 02/15/2014  Chief Complaint  Patient presents with  . Follow-up    Pt states his SOB has worsened since last OV. Pt states he has had to use his rescue inhaler in between using the combivent he needs to use the rescue inhaler. C/o coughing with intermittent white mucous production and c/o chest tightness when he is due for another dose of combivent.    Followup. Presents with a significant other  - Obstructive lung disease not otherwise specified:  Pulmonary function test shows FEV1 of 0.80 the/19% with an FVC of 2.2 L/41% and a ratio of 36. This is consistent with very severe severe obstructive lung disease. Postbronchodilator FEV1  improved significantly to 1.1 L/27% showing bronchodilator reversibility. Total lung capacity is 9.3 /128% and residual volume  was 5.80 the/plan 62% consistent with significant air trapping and hyperinflation DLCO is 24.3/73% and is only mildly impaired. Findings are consistent with severe persistent asthma with significant bronchodilator reversibility and perhaps mild emphysema that is associated with this  - He now tells me that around age 43 he was diagnosed with asthma. He is always use only albuterol as needed but for a few years several years ago he did use Advair on a regular basis and this did help. He did stop the Advair after he is out of health insurance following a divorce. He remains uninsured. Now for the past 2-3 weeks he's had increasing symptoms blood in the chest tightness, cough and shortness of breath and occasional wheezing. His Combivent efficacy does not last more than a few hours and he is residing to increased albuterol use. He is also having nocturnal awakenings.   - Right upper lobe lung nodule: The CT scan of the chest July 2013 is reviewed below and this shows the lung nodule is stable at the 3 month month time point. He continues to do a hemoptysis that he once had. The radiologist has recommended followup scan in 6 months but I feel that given his age and smoking history in size the scanning to be done at 3 months. His girlfriend and he reluctant about the frequency of scans do to radiation risk. Therefore  they have opted for serologic testing for pretest probability of lung cancer and indeterminate nodule; the test called INDI  - Smoking: He continues to smoke. He knows he needs to quit but is unable to   reports that he has been smoking Cigarettes.  He has a 37 pack-year smoking history. He does not have any smokeless tobacco history on file.   Ct Chest Wo Contrast  02/14/2014   CLINICAL DATA:  Follow-up right upper lobe pulmonary nodule  EXAM: CT CHEST WITHOUT CONTRAST   TECHNIQUE: Multidetector CT imaging of the chest was performed following the standard protocol without IV contrast.  COMPARISON:  12/03/2013  FINDINGS: 8 x 18 mm irregular posterior right upper lobe nodular opacity (series 3/ image 16), previously 9 x 19 mm, unchanged.  Scattered areas of bronchiectasis with bronchial wall thickening, suggesting chronic bronchitis, with upper lobe predominant scarring. Biapical pleural parenchymal scarring.  Moderate centrilobular emphysematous changes.  No pleural effusion or pneumothorax.  Visualized thyroid is unremarkable.  The heart is normal in size.  No pericardial effusion.  Small mediastinal lymph nodes which do not meet pathologic CT size criteria.  Visualized upper abdomen is notable for small gastrohepatic and portacaval lymph nodes which do not meet pathologic CT size criteria.  Visualized osseous structures are within normal limits.  IMPRESSION: 8 x 18 mm irregular posterior right upper lobe nodular opacity, unchanged. Three month stability has been demonstrated. Consider follow-up CT chest in 6 months.  Underlying moderate emphysematous changes.  This recommendation follows the consensus statement: Guidelines for Management of Small Pulmonary Nodules Detected on CT Scans: A Statement from the Fleischner Society as published in Radiology 2005; 237:395-400.   Electronically Signed   By: Charline Bills M.D.   On: 02/14/2014 11:08    03/31/2014 Follow up Asthma /COPD -Severe  Patient returns for a 6 week followup for severe COPD, and asthma, and an active smoker. Last visit with a COPD exacerbation treated with a steroid taper. Patient was started on Advair and Spiriva. Patient returns for reports that he is feeling much improved. Feels that his breathing has improved at least 80% and feels that his shortness, of breath is significantly better. Last visit PFTs show severe airflow obstruction with an FEV1 of 19% with significant bronchodilator response with a 40%  improvement. Today. Spirometry was repeated and showed persistent, very severe obstruction, with an FEV1 at 26%, and mid-flows at 9%. We discussed smoking cessation. He does have a family history of emphysema, he agreed to alpha-1 testing. Today.  Unfortunately, patient does not have medical insurance or prescription coverage. He is currently looking into some community programs. He has paperwork for a medication assistance program through Spiriva and Advair companies.   He denies any hemoptysis, orthopnea, PND, leg swelling, or edema.  HPI    OV 07/08/2014  Chief Complaint  Patient presents with  . Follow-up    Pt states his breathing is unchanged since last OV. Pt c/o sharp/dull pain in right chest that last around 6 sec x 2 days. Pt denies change in cough.     Follow-up  - COPD/emphysema/asthma: Since last visit no interim exacerbations. He feels well. He refuses flu shot adamantly in the suspicious of intentions of the government. He is contemplating joining COPD trials with [employer] Dr. Francesca Oman  - New issue: He has mild right infra-axillary lateral to the right nipple pinpoint area of chest pain that occurs randomly and he winces and has a burning quality without any radiation or  specific aggravating or relieving factors it is mild to moderate in severity. This seems to correlate with appearance of a right upper lobe nodule and the CT scan yesterday  - Lung nodule right upper lobe posterior segment: He's had this since May 2015. It was stable in July 2015. He had repeat follow-up CT scan of the chest 07/06/2014 and it is stable but he has a new nodule in the same area currently  - Smoking: He continues to smoke   Social history: No new issues. His significant other is with him today   Ct Chest Low Dose Screening W/o Cm  07/06/2014   CLINICAL DATA:  Current smoker, 40 pack year history, 53 years old.  EXAM: CT CHEST WITHOUT CONTRAST  TECHNIQUE: Multidetector CT  imaging of the chest was performed following the standard protocol without intravenous contrast. High resolution imaging of the lungs, as well as inspiratory and expiratory imaging, was performed.  COMPARISON:  02/14/2014 and 12/03/2013.  FINDINGS: Mediastinal lymph nodes are not enlarged by CT size criteria. Hilar regions are difficult to definitively evaluate without IV contrast but appear grossly unremarkable. No axillary adenopathy. Mild bilateral gynecomastia. Heart size normal. No pericardial effusion.  Moderate centrilobular emphysema. There are scattered new pulmonary nodules, measuring up to 11 mm (8 x 14 mm) in the right upper lobe (image 89). These appear to have a mild surrounding inflammatory component. Scarring in the posterior right upper lobe, with a nodular component measuring up to 1.0 x 1.9 cm, image 105), stable from prior exams. Additional scattered smaller pulmonary nodules are grossly stable. Scattered pulmonary parenchymal scarring. No pleural fluid. Debris is seen dependently in the trachea.  Incidental imaging of the upper abdomen shows the visualized portions of the liver, gallbladder and adrenal glands to be grossly unremarkable. There may be a low-attenuation lesion in the upper pole right kidney, measuring approximate 12 mm, as before. Visualized portions of the kidneys, spleen, pancreas, stomach and bowel are otherwise grossly unremarkable. No upper abdominal adenopathy.  No worrisome lytic or sclerotic lesions.  IMPRESSION: 1. Scattered new pulmonary nodules measure up to 11 mm in the right upper lobe. Appearance and time course favor an infectious/inflammatory etiology but malignancy cannot be excluded. Lung-RADS Category 4B, suspicious. Please correlate clinically. Additional imaging (please use the following order, "CT chest low-dose screening follow-up") should be obtained in 6 weeks to 3 months in further evaluation. These results will be called to the ordering clinician or  representative by the Radiologist Assistant, and communication documented in the PACS or zVision Dashboard. 2. Scattered pulmonary parenchymal scarring and nodularity, stable.   Electronically Signed   By: Leanna Battles M.D.   On: 07/06/2014 11:58     Review of Systems  Constitutional: Negative for fever and unexpected weight change.  HENT: Negative for congestion, dental problem, ear pain, nosebleeds, postnasal drip, rhinorrhea, sinus pressure, sneezing, sore throat and trouble swallowing.   Eyes: Negative for redness and itching.  Respiratory: Positive for cough and shortness of breath. Negative for chest tightness and wheezing.   Cardiovascular: Negative for palpitations and leg swelling.  Gastrointestinal: Negative for nausea and vomiting.  Genitourinary: Negative for dysuria.  Musculoskeletal: Negative for joint swelling.  Skin: Negative for rash.  Neurological: Negative for headaches.  Hematological: Does not bruise/bleed easily.  Psychiatric/Behavioral: Negative for dysphoric mood. The patient is not nervous/anxious.    Current outpatient prescriptions: albuterol (PROVENTIL HFA;VENTOLIN HFA) 108 (90 BASE) MCG/ACT inhaler, Inhale 2 puffs into the lungs every 4 (four) hours as  needed for wheezing or shortness of breath. , Disp: , Rfl: ;  cloNIDine (CATAPRES) 0.1 MG tablet, Take 0.2 mg by mouth 2 (two) times daily., Disp: , Rfl: ;  Fluticasone-Salmeterol (ADVAIR DISKUS) 500-50 MCG/DOSE AEPB, Inhale 1 puff into the lungs 2 (two) times daily., Disp: 2 each, Rfl: 0 ibuprofen (ADVIL,MOTRIN) 200 MG tablet, Take 200 mg by mouth every 6 (six) hours as needed., Disp: , Rfl: ;  Multiple Vitamin (MULTIVITAMIN WITH MINERALS) TABS tablet, Take 1 tablet by mouth daily., Disp: 30 tablet, Rfl: 0;  thiamine 100 MG tablet, Take 100 mg by mouth daily., Disp: , Rfl: ;  tiotropium (SPIRIVA) 18 MCG inhalation capsule, Place 1 capsule (18 mcg total) into inhaler and inhale daily., Disp: 20 capsule, Rfl:  0 Ipratropium-Albuterol (COMBIVENT RESPIMAT) 20-100 MCG/ACT AERS respimat, Inhale 1 puff into the lungs every 6 (six) hours. (Patient not taking: Reported on 07/08/2014), Disp: 1 Inhaler, Rfl: 0;  mometasone-formoterol (DULERA) 100-5 MCG/ACT AERO, Inhale 2 puffs into the lungs 2 (two) times daily. (Patient not taking: Reported on 07/08/2014), Disp: 1 Inhaler, Rfl: 0     Objective:   Physical Exam  Constitutional: He is oriented to person, place, and time. He appears well-developed and well-nourished. No distress.  HENT:  Head: Normocephalic and atraumatic.  Right Ear: External ear normal.  Left Ear: External ear normal.  Mouth/Throat: Oropharynx is clear and moist. No oropharyngeal exudate.  Eyes: Conjunctivae and EOM are normal. Pupils are equal, round, and reactive to light. Right eye exhibits no discharge. Left eye exhibits no discharge. No scleral icterus.  Neck: Normal range of motion. Neck supple. No JVD present. No tracheal deviation present. No thyromegaly present.  Cardiovascular: Normal rate, regular rhythm and intact distal pulses.  Exam reveals no gallop and no friction rub.   No murmur heard. Pulmonary/Chest: Effort normal. No respiratory distress. He has wheezes. He has no rales. He exhibits no tenderness.  Mild tenderness rithg infra-axilalry and lateral to right nippe  Abdominal: Soft. Bowel sounds are normal. He exhibits no distension and no mass. There is no tenderness. There is no rebound and no guarding.  Musculoskeletal: Normal range of motion. He exhibits no edema or tenderness.  Lymphadenopathy:    He has no cervical adenopathy.  Neurological: He is alert and oriented to person, place, and time. He has normal reflexes. No cranial nerve deficit. Coordination normal.  Skin: Skin is warm and dry. No rash noted. He is not diaphoretic. No erythema. No pallor.  Psychiatric: He has a normal mood and affect. His behavior is normal. Judgment and thought content normal.  Nursing  note and vitals reviewed.    Filed Vitals:   07/08/14 1000  BP: 158/96  Pulse: 83  Height: 5\' 11"  (1.803 m)  Weight: 185 lb (83.915 kg)  SpO2: 97%        Assessment & Plan:     ICD-9-CM ICD-10-CM   1. Nodule of right lung 793.11 R91.1   2. Smoking 305.1 Z72.0   3. COPD exacerbation 491.21 J44.1   4. Atypical chest pain 786.59 R07.89    #COPD exacerbation  - you are in the midst of one  -  take levaquin 500mg  once daily  X 5 days - Please take prednisone 40 mg x1 day, then 30 mg x1 day, then 20 mg x1 day, then 10 mg x1 day, and then 5 mg x1 day and stop  # #Obstructive lung disease - this is very severe and mixture of asthma and y emphysema -  Continue on  spiriva once daily - Continue on advair 500/50 1 puff twice daily - Ok to participate in research protocol per your desire with Dr Dayton Scrape as long as no contra-indication - Use albuterol or combivent now as needed - respect your decision to refuse flu shot   #Lung nodule right lung - Right upper lobe  - Old lung nodule - stable May 2015, July 2015 and Dec 2015 - New lung nodule - Dec 2015 - likely related to viral infection and might explain chest pain  #Chest pain  - likely related to lung nodule and aecopd  #Smoking  - please try to quit  #FOllowup 3 months - have Low Dose CT protocol - for lung nodule followup 3 months with Dr Marchelle Gearing    Dr. Kalman Shan, M.D., [facility].C.P Pulmonary and Critical Care Medicine Staff Physician [place] [facility] [place] [facility] and Critical [facility] Pager: [phone], If no answer or between  15:00h - 7:00h: call [phone]  07/08/2014 11:00 AM

## 2014-07-08 NOTE — Patient Instructions (Addendum)
#  COPD exacerbation  - you are in the midst of one  -  take levaquin 500mg  once daily  X 5 days - Please take prednisone 40 mg x1 day, then 30 mg x1 day, then 20 mg x1 day, then 10 mg x1 day, and then 5 mg x1 day and stop  # #Obstructive lung disease - this is very severe and mixture of asthma and y emphysema - Continue on  spiriva once daily - Continue on advair 500/50 1 puff twice daily - Ok to participate in research protocol per your desire with Dr - Use albuterol or combivent now as needed - respect your decision to refuse flu shot -    #Lung nodule right lung - Right upper lobe  - Old lung nodule - stable May 2015, July 2015 and Dec 2015 - New lung nodule - Dec 2015 - likely related to viral infection and might explain chest pain  #Chest pain  - likely related to lung nodule and aecopd  #Smoking  - please try to quit  #FOllowup 3 months - have Low Dose CT protocol - for lung nodule followup 3 months with Dr Jan 2016

## 2014-08-08 ENCOUNTER — Telehealth: Payer: Self-pay | Admitting: Internal Medicine

## 2014-08-08 MED ORDER — NICOTINE 21 MG/24HR TD PT24: MEDICATED_PATCH | TRANSDERMAL | Status: DC

## 2014-08-08 NOTE — Telephone Encounter (Signed)
He needs to buy Nicotine patch OTC   Starting on the quit day, patients who smoke >10 cigarettes/day (one-half pack) use the highest dose of the nicotine patch (21 mg/day) for six weeks, followed by 14 mg/day for two weeks, and finish with 7 mg/day for two weeks.   Instructions will be in it but main points  -  To use the nicotine patch, the smoker applies one patch each morning to any non-hairy skin site. It is removed and replaced with a new patch the next morning. The patch site should be rotated daily to avoid skin irritation, which is the most common side effect.  If he has  Insomnia and vivid dreams can be minimized by removing the patch at bedtime.    Dr. Kalman Shan, M.D., [facility].C.P Pulmonary and Critical Care Medicine Staff Physician [place] [facility] [place] [facility]monary and Critical Care Pager: [phone], If no answer or between  15:00h - 7:00h: call [phone]  08/08/2014 1:58 PM

## 2014-08-08 NOTE — Telephone Encounter (Signed)
lmomtcb for [employer]

## 2014-08-08 NOTE — Telephone Encounter (Signed)
I spoke with Derek Conner and advised that the patches are OTC. She insisted that it will be covered by the pt insurance and will be cheaper that way. I advised we have not had much luck with insurance covering the patches but that I will send in an Rx for her to compare prices. Rx sent. Instructions reviewed. Carron Curie, CMA

## 2014-08-08 NOTE — Telephone Encounter (Signed)
LMTC x 1 for Lurena Joiner

## 2014-08-08 NOTE — Telephone Encounter (Signed)
Spoke with Lurena Joiner and she states pt is trying to quit smoking and would like to try the Nicotine patches.  She thinks pt has tried this in the past also.  He is currently smoking 1 ppd.  Please advise.

## 2014-08-10 ENCOUNTER — Telehealth: Payer: Self-pay | Admitting: Internal Medicine

## 2014-08-10 NOTE — Telephone Encounter (Signed)
Will send to [facility] to follow up on

## 2014-08-16 NOTE — Telephone Encounter (Signed)
Received form.  MR- Pt's Advair is now needing PA. Covered alternatives are Symbicort and Dulera.  Please advise if to initiate PA for Advair or try alternative.

## 2014-08-16 NOTE — Telephone Encounter (Signed)
Give him symbicort 80/4.5 , 2 puff bid instead of advair

## 2014-08-17 MED ORDER — BUDESONIDE-FORMOTEROL FUMARATE 80-4.5 MCG/ACT IN AERO: 2.0000 | INHALATION_SPRAY | Freq: Two times a day (BID) | RESPIRATORY_TRACT | Status: DC

## 2014-08-17 NOTE — Telephone Encounter (Signed)
lmtcb for Derek Conner.  

## 2014-08-17 NOTE — Telephone Encounter (Signed)
Derek Conner called back and she is aware of MR recs.  symbicort sent in to the pharmacy and nothing further is needed.

## 2014-08-29 ENCOUNTER — Telehealth: Payer: Self-pay | Admitting: Internal Medicine

## 2014-08-29 ENCOUNTER — Other Ambulatory Visit: Payer: Self-pay

## 2014-08-29 MED ORDER — FLUTICASONE-SALMETEROL 500-50 MCG/DOSE IN AEPB: 1.0000 | INHALATION_SPRAY | Freq: Two times a day (BID) | RESPIRATORY_TRACT | Status: DC

## 2014-08-29 NOTE — Telephone Encounter (Signed)
Called spoke with pt GF. Aware samples left for pick up of advair. RX will be sent to pharm, so we can get PA # to call for. Will hold in triage to call pharmacy in the AM to start PA.

## 2014-08-29 NOTE — Telephone Encounter (Signed)
HE was on advair. Insurance wil not give it without PA. NExt step is to try Dulera 100/4.5 , 2 puff bid and gie [place] feedback. YUou can give sample and script  Dr. Kalman Shan, M.D., [facility].C.P Pulmonary and Critical Care Medicine Staff Physician [place] [facility] [place] Pulmonary and Critical Care Pager: [phone], If no answer or between  15:00h - 7:00h: call [phone]  08/29/2014 12:38 PM

## 2014-08-29 NOTE — Telephone Encounter (Signed)
Per MR, proceed with Advair PA, give pt samples if available, and add dulera to allergy list.

## 2014-08-29 NOTE — Telephone Encounter (Signed)
Spoke with pt's girlfriend. States that since starting Symbicort he has been having side effects. Reports SOB, chest pain, nausea and vomiting. Pt took 2 days worth of medication. Advised not to take anymore Symbicort until they hear back from [place].  MR - please advise. Thanks.

## 2014-08-29 NOTE — Telephone Encounter (Signed)
Spoke with Lurena Joiner and notified of recs per MR  She verbalized understanding  She states that the pt has already tried taking Dulera and this caused HA  Please advise, thanks

## 2014-08-29 NOTE — Telephone Encounter (Signed)
Spoke with Pam at pharmacy, put in new rx to initiate advair pa.  Will await this fax.

## 2014-08-30 NOTE — Telephone Encounter (Signed)
Let me know about signing it. Please add symbicort to allergy list as well with their description of it  Allergies  Allergen Reactions  . Dulera [Mometasone Furo-Formoterol Fum]     Headache

## 2014-08-31 NOTE — Telephone Encounter (Signed)
Spoke with pt daughter, aware of status of getting Advair approved. Aware that we will call once coverage has been determined.  Will hold in triage to await fax of PA.

## 2014-09-06 NOTE — Telephone Encounter (Signed)
[name] returning Lindsay's call - [phone]

## 2014-09-06 NOTE — Telephone Encounter (Signed)
Spoke with Lurena Joiner. Made her aware of the below information. She got very hostile and said that she brought a letter to our office from [employer] company that said Advair will need a PA. Stated that this letter was given to [facility] nurse.  Robynn Pane do you know anything about this?

## 2014-09-06 NOTE — Telephone Encounter (Signed)
We still have not received a PA on Advair. Spoke with Maxine Glenn at [facility]. Advair is covered by insurance it just has a $100 copay.  Left message for pt to call back x1

## 2014-09-06 NOTE — Telephone Encounter (Signed)
Yes, per phone note on 08/10/2014 Advair will need a PA. Called and spoke to [place] with Monia Pouch [phone]) and was informed a PA for advair is needed. [facility] pharmacy PA line at [phone] and spoke with Bjorn Loser and was informed Advair is a non-formulary medication but does not need a PA. Since the new year the pt's plan covers Advair, Dulera and Symbicort but with high co-pay. Questioned if Bjorn Loser could initiate a tier exception because of the high copay and the pt has tried and failed the other medications. Unfortunately, the pt does not qualify for a tier exception d/t type of insurance coverage. Called and spoke to Lurena Joiner to inform her of the update. Lurena Joiner stated she was able to get the Advair for $50 with a coupon that the pharmacy gave her. Monica at [facility] pharmacy stated they are able to continue giving them the coupon as long as there isn't a generic. Informed Lurena Joiner of the coupon and they ar able to afford the Advair. Informed her to contact us if anything else is needed. Lurena Joiner verbalized understanding and denied any further questions or concerns at this time.

## 2014-10-05 ENCOUNTER — Inpatient Hospital Stay: Admission: RE | Admit: 2014-10-05 | Payer: Self-pay | Source: Ambulatory Visit

## 2014-10-25 ENCOUNTER — Ambulatory Visit (INDEPENDENT_AMBULATORY_CARE_PROVIDER_SITE_OTHER)
Admission: RE | Admit: 2014-10-25 | Discharge: 2014-10-25 | Disposition: A | Discharge status: Home / Self Care | Payer: Managed Care, Other (non HMO) | Source: Ambulatory Visit | Attending: Internal Medicine | Admitting: Internal Medicine

## 2014-10-25 ENCOUNTER — Other Ambulatory Visit: Payer: Self-pay | Admitting: Internal Medicine

## 2014-10-25 DIAGNOSIS — R911 Solitary pulmonary nodule: Secondary | ICD-10-CM | POA: Diagnosis not present

## 2014-10-25 DIAGNOSIS — Z720 Tobacco use: Secondary | ICD-10-CM | POA: Diagnosis not present

## 2014-10-25 DIAGNOSIS — F17200 Nicotine dependence, unspecified, uncomplicated: Secondary | ICD-10-CM

## 2014-11-02 ENCOUNTER — Telehealth: Payer: Self-pay | Admitting: Internal Medicine

## 2014-11-02 DIAGNOSIS — R911 Solitary pulmonary nodule: Secondary | ICD-10-CM

## 2014-11-02 NOTE — Telephone Encounter (Signed)
CTh chest 10/25/14  IMPRESSION: 1. The nodule of interest in the right upper lobe is markedly decrea  2. Surrounding, likely infectious or inflammatory opacities in the right upper lobe are increased.    They are recommending fu Ct ches in 3 months but I think 4-6 months is fine  If he is ok with it. Please order one wo contrast in 5 months

## 2014-11-03 ENCOUNTER — Ambulatory Visit (INDEPENDENT_AMBULATORY_CARE_PROVIDER_SITE_OTHER): Payer: Managed Care, Other (non HMO) | Admitting: Internal Medicine

## 2014-11-03 ENCOUNTER — Encounter: Payer: Self-pay | Admitting: Internal Medicine

## 2014-11-03 VITALS — BP 176/94 | HR 89 | Ht 71.0 in | Wt 185.0 lb

## 2014-11-03 DIAGNOSIS — IMO0001 Reserved for inherently not codable concepts without codable children: Secondary | ICD-10-CM

## 2014-11-03 DIAGNOSIS — Z720 Tobacco use: Secondary | ICD-10-CM

## 2014-11-03 DIAGNOSIS — R911 Solitary pulmonary nodule: Secondary | ICD-10-CM | POA: Diagnosis not present

## 2014-11-03 DIAGNOSIS — J441 Chronic obstructive pulmonary disease with (acute) exacerbation: Secondary | ICD-10-CM

## 2014-11-03 DIAGNOSIS — F17200 Nicotine dependence, unspecified, uncomplicated: Secondary | ICD-10-CM

## 2014-11-03 MED ORDER — PREDNISONE 10 MG PO TABS: ORAL_TABLET | ORAL | Status: DC

## 2014-11-03 MED ORDER — DOXYCYCLINE HYCLATE 100 MG PO TABS: ORAL_TABLET | ORAL | Status: DC

## 2014-11-03 NOTE — Telephone Encounter (Signed)
Called and spoke to pt. Informed pt of the results and recs per MR. Order placed. Pt verbalized understanding and denied any further questions or concerns at this time.   

## 2014-11-03 NOTE — Patient Instructions (Addendum)
ICD-9-CM ICD-10-CM   1. COPD exacerbation 491.21 J44.1   2. Smoking 305.1 Z72.0   3. Nodule of right lung 793.11 R91.1    Take doxycycline 100mg  po twice daily x 5 days; take after meals and avoid sunlight Please take prednisone 40 mg x1 day, then 30 mg x1 day, then 20 mg x1 day, then 10 mg x1 day, and then 5 mg x1 day and stop Continue spiriva and advair as before with albuterol as needed Will refer to for IMPACT COPD research trial Quit smoking Do Fu CT chest wo contrast in 6 months OK to go to [place] but exert with great care  Followup  3 months or sooner if needed

## 2014-11-03 NOTE — Progress Notes (Signed)
Subjective:    Patient ID: Derek Conner, male    DOB: 01/28/1961, 54 y.o.   MRN: [id number]  HPI   OV 07/08/2014  Chief Complaint  Patient presents with  . Follow-up    Pt states his breathing is unchanged since last OV. Pt c/o sharp/dull pain in right chest that last around 6 sec x 2 days. Pt denies change in cough.     Follow-up  - COPD/emphysema/asthma: Since last visit no interim exacerbations. He feels well. He refuses flu shot adamantly in the suspicious of intentions of the government. He is contemplating joining COPD trials with [facility] Dr. Francesca Oman  - New issue: He has mild right infra-axillary lateral to the right nipple pinpoint area of chest pain that occurs randomly and he winces and has a burning quality without any radiation or specific aggravating or relieving factors it is mild to moderate in severity. This seems to correlate with appearance of a right upper lobe nodule and the CT scan yesterday  - Lung nodule right upper lobe posterior segment: He's had this since May 2015. It was stable in July 2015. He had repeat follow-up CT scan of the chest 07/06/2014 and it is stable but he has a new nodule in the same area currently  - Smoking: He continues to smoke   Social history: No new issues. His significant other is with him today   Ct Chest Low Dose Screening W/o Cm  07/06/2014   CLINICAL DATA:  Current smoker, 40 pack year history, 54 years old.  EXAM: CT CHEST WITHOUT CONTRAST  TECHNIQUE: Multidetector CT imaging of the chest was performed following the standard protocol without intravenous contrast. High resolution imaging of the lungs, as well as inspiratory and expiratory imaging, was performed.  COMPARISON:  02/14/2014 and 12/03/2013.  FINDINGS: Mediastinal lymph nodes are not enlarged by CT size criteria. Hilar regions are difficult to definitively evaluate without IV contrast but appear grossly unremarkable. No axillary adenopathy. Mild  bilateral gynecomastia. Heart size normal. No pericardial effusion.  Moderate centrilobular emphysema. There are scattered new pulmonary nodules, measuring up to 11 mm (8 x 14 mm) in the right upper lobe (image 89). These appear to have a mild surrounding inflammatory component. Scarring in the posterior right upper lobe, with a nodular component measuring up to 1.0 x 1.9 cm, image 105), stable from prior exams. Additional scattered smaller pulmonary nodules are grossly stable. Scattered pulmonary parenchymal scarring. No pleural fluid. Debris is seen dependently in the trachea.  Incidental imaging of the upper abdomen shows the visualized portions of the liver, gallbladder and adrenal glands to be grossly unremarkable. There may be a low-attenuation lesion in the upper pole right kidney, measuring approximate 12 mm, as before. Visualized portions of the kidneys, spleen, pancreas, stomach and bowel are otherwise grossly unremarkable. No upper abdominal adenopathy.  No worrisome lytic or sclerotic lesions.  IMPRESSION: 1. Scattered new pulmonary nodules measure up to 11 mm in the right upper lobe. Appearance and time course favor an infectious/inflammatory etiology but malignancy cannot be excluded. Lung-RADS Category 4B, suspicious. Please correlate clinically. Additional imaging (please use the following order, "CT chest low-dose screening follow-up") should be obtained in 6 weeks to 3 months in further evaluation. These results will be called to the ordering clinician or representative by the Radiologist Assistant, and communication documented in the PACS or zVision Dashboard. 2. Scattered pulmonary parenchymal scarring and nodularity, stable.   Electronically Signed   By: Leanna Battles M.D.  On: 07/06/2014 11:58     OV 11/03/2014   Chief Complaint  Patient presents with  . Follow-up    Review low dose ct from 3/22.  pt c/o wheezing, prod cough with yellow/green mucus.      COPD: He is on Spiriva  and Advair. He did not qualify for COPD file with from [facility] Dr[facility] Francesca Oman. A month ago he had a COPD exacerbation and was seen in urgent care and treated with antibiotics. He did not get prednisone. He feels he did not fully recover from it. The last few days he's had increased cough, sputum and shortness of breath and chest tightness and feels he is in another exacerbation. He is wondering if it might be due to pollen.  Smoking: He continues to smoke. He did quit for a week or so but has relapsed but only smoking 5 or 6 cigarettes. He says he is trying hard.  Lung nodule: He had CT scan of the chest last week his right upper lobe lung nodule is decreased but surrounding it is some inflammation which might be related to his viral infection a month ago.    has a past medical history of COPD (chronic obstructive pulmonary disease); Emphysema lung; Hypertension; Asthma; and Rheumatoid arthritis.   reports that he has been smoking Cigarettes.  He has a 37 pack-year smoking history. He has never used smokeless tobacco.  Past Surgical History  Procedure Laterality Date  . Appendectomy  1980's  . Foot fracture surgery Left ~ 2005    "it was crushed"  . Cystectomy Left 1960's    "wrist"  . Chest tube insertion Left 1990's    "lung collapsed"  . Video bronchoscopy Bilateral 12/06/2013    Procedure: VIDEO BRONCHOSCOPY WITHOUT FLUORO;  Surgeon: Alyson Reedy, MD;  Location: [facility] ENDOSCOPY;  Service: Cardiopulmonary;  Laterality: Bilateral;    Allergies  Allergen Reactions  . Symbicort [Budesonide-Formoterol Fumarate] Shortness Of Breath, Nausea And Vomiting and Other (See Comments)    heachache  . Dulera [Mometasone Furo-Formoterol Fum]     Headache     There is no immunization history on file for this patient.  No family history on file.   Current outpatient prescriptions:  .  albuterol (PROVENTIL HFA;VENTOLIN HFA) 108 (90 BASE) MCG/ACT inhaler, Inhale 2 puffs into the lungs every 4  (four) hours as needed for wheezing or shortness of breath. , Disp: , Rfl:  .  cloNIDine (CATAPRES) 0.1 MG tablet, Take 0.2 mg by mouth 2 (two) times daily., Disp: , Rfl:  .  Fluticasone-Salmeterol (ADVAIR DISKUS) 500-50 MCG/DOSE AEPB, Inhale 1 puff into the lungs 2 (two) times daily., Disp: 2 each, Rfl: 0 .  ibuprofen (ADVIL,MOTRIN) 200 MG tablet, Take 200 mg by mouth every 6 (six) hours as needed., Disp: , Rfl:  .  Multiple Vitamin (MULTIVITAMIN WITH MINERALS) TABS tablet, Take 1 tablet by mouth daily., Disp: 30 tablet, Rfl: 0 .  nicotine (NICODERM CQ - DOSED IN MG/24 HOURS) 21 mg/24hr patch, Use one 21mg  patch per day x 6 weeks, then decrease to 14mg  daily x 2 weeks, then 7mg  daily x 2 weeks., Disp: 28 patch, Rfl: 6 .  thiamine 100 MG tablet, Take 100 mg by mouth daily., Disp: , Rfl:  .  tiotropium (SPIRIVA) 18 MCG inhalation capsule, Place 1 capsule (18 mcg total) into inhaler and inhale daily., Disp: 20 capsule, Rfl: 0    Review of Systems  Constitutional: Negative for fever and unexpected weight change.  HENT: Positive  for congestion, postnasal drip, sinus pressure and sore throat. Negative for dental problem, ear pain, nosebleeds, rhinorrhea, sneezing and trouble swallowing.   Eyes: Negative for redness and itching.  Respiratory: Positive for cough, shortness of breath and wheezing. Negative for chest tightness.   Cardiovascular: Negative for palpitations and leg swelling.  Gastrointestinal: Negative for nausea and vomiting.  Genitourinary: Negative for dysuria.  Musculoskeletal: Negative for joint swelling.  Skin: Negative for rash.  Neurological: Negative for headaches.  Hematological: Does not bruise/bleed easily.  Psychiatric/Behavioral: Negative for dysphoric mood. The patient is not nervous/anxious.        Objective:   Physical Exam  Constitutional: He is oriented to person, place, and time. He appears well-developed and well-nourished. No distress.  HENT:  Head:  Normocephalic and atraumatic.  Right Ear: External ear normal.  Left Ear: External ear normal.  Mouth/Throat: Oropharynx is clear and moist. No oropharyngeal exudate.  Eyes: Conjunctivae and EOM are normal. Pupils are equal, round, and reactive to light. Right eye exhibits no discharge. Left eye exhibits no discharge. No scleral icterus.  Neck: Normal range of motion. Neck supple. No JVD present. No tracheal deviation present. No thyromegaly present.  Cardiovascular: Normal rate, regular rhythm and intact distal pulses.  Exam reveals no gallop and no friction rub.   No murmur heard. Pulmonary/Chest: Effort normal and breath sounds normal. No respiratory distress. He has no wheezes. He has no rales. He exhibits no tenderness.  Barrel chest with mild purse lip breathing no wheeze  Abdominal: Soft. Bowel sounds are normal. He exhibits no distension and no mass. There is no tenderness. There is no rebound and no guarding.  Musculoskeletal: Normal range of motion. He exhibits no edema or tenderness.  Lymphadenopathy:    He has no cervical adenopathy.  Neurological: He is alert and oriented to person, place, and time. He has normal reflexes. No cranial nerve deficit. Coordination normal.  Skin: Skin is warm and dry. No rash noted. He is not diaphoretic. No erythema. No pallor.  Psychiatric: He has a normal mood and affect. His behavior is normal. Judgment and thought content normal.  Nursing note and vitals reviewed.   Filed Vitals:   11/03/14 1635  BP: 176/94  Pulse: 89  Height: 5\' 11"  (1.803 m)  Weight: 185 lb (83.915 kg)  SpO2: 100%         Assessment & Plan:     ICD-9-CM ICD-10-CM   1. COPD exacerbation 491.21 J44.1   2. Smoking 305.1 Z72.0   3. Nodule of right lung 793.11 R91.1     Take doxycycline 100mg  po twice daily x 5 days; take after meals and avoid sunlight Please take prednisone 40 mg x1 day, then 30 mg x1 day, then 20 mg x1 day, then 10 mg x1 day, and then 5 mg x1 day  and stop Continue spiriva and advair as before with albuterol as needed Will refer to for IMPACT COPD research trial Quit smoking Do Fu CT chest wo contrast in 6 months OK to go to [place] but exert with great care  Followup  3 months or sooner if needed     Dr. , M.D., [facility].C.P Pulmonary and Critical Care Medicine Staff Physician  System [place] [facility] Pager: [phone], If no answer or between  15:00h - 7:00h: call [phone]  11/03/2014 5:20 PM

## 2014-12-09 ENCOUNTER — Ambulatory Visit (INDEPENDENT_AMBULATORY_CARE_PROVIDER_SITE_OTHER): Payer: Managed Care, Other (non HMO) | Admitting: Medical

## 2014-12-09 ENCOUNTER — Encounter: Payer: Self-pay | Admitting: Medical

## 2014-12-09 VITALS — HR 79 | Temp 98.8°F | Ht 72.5 in | Wt 190.0 lb

## 2014-12-09 DIAGNOSIS — I10 Essential (primary) hypertension: Secondary | ICD-10-CM

## 2014-12-09 LAB — CBC WITH DIFFERENTIAL/PLATELET
Basophils Absolute: 0.1 10*3/uL (ref 0.0–0.1)
Basophils Relative: 0.7 % (ref 0.0–3.0)
EOS ABS: 0.3 10*3/uL (ref 0.0–0.7)
Eosinophils Relative: 4.1 % (ref 0.0–5.0)
HEMATOCRIT: 41.1 % (ref 39.0–52.0)
HEMOGLOBIN: 13.9 g/dL (ref 13.0–17.0)
LYMPHS ABS: 2.9 10*3/uL (ref 0.7–4.0)
Lymphocytes Relative: 37.2 % (ref 12.0–46.0)
MCHC: 33.8 g/dL (ref 30.0–36.0)
MCV: 86.3 fl (ref 78.0–100.0)
MONO ABS: 0.7 10*3/uL (ref 0.1–1.0)
Monocytes Relative: 9.1 % (ref 3.0–12.0)
Neutro Abs: 3.8 10*3/uL (ref 1.4–7.7)
Neutrophils Relative %: 48.9 % (ref 43.0–77.0)
PLATELETS: 291 10*3/uL (ref 150.0–400.0)
RBC: 4.76 Mil/uL (ref 4.22–5.81)
RDW: 13.5 % (ref 11.5–15.5)
WBC: 7.9 10*3/uL (ref 4.0–10.5)

## 2014-12-09 LAB — COMPREHENSIVE METABOLIC PANEL
ALBUMIN: 4 g/dL (ref 3.5–5.2)
ALT: 25 U/L (ref 0–53)
AST: 20 U/L (ref 0–37)
Alkaline Phosphatase: 72 U/L (ref 39–117)
BUN: 11 mg/dL (ref 6–23)
CHLORIDE: 102 meq/L (ref 96–112)
CO2: 29 meq/L (ref 19–32)
CREATININE: 1.23 mg/dL (ref 0.40–1.50)
Calcium: 9.7 mg/dL (ref 8.4–10.5)
GFR: 65.07 mL/min (ref >60.00)
Glucose, Bld: 98 mg/dL (ref 70–99)
POTASSIUM: 4 meq/L (ref 3.5–5.1)
SODIUM: 136 meq/L (ref 135–145)
TOTAL PROTEIN: 7.1 g/dL (ref 6.0–8.3)
Total Bilirubin: 0.4 mg/dL (ref 0.2–1.2)

## 2014-12-09 MED ORDER — AMLODIPINE BESYLATE 10 MG PO TABS: 10.0000 mg | ORAL_TABLET | Freq: Every day | ORAL | Status: DC

## 2014-12-09 NOTE — Progress Notes (Signed)
Pre visit review using our clinic review tool, if applicable. No additional management support is needed unless otherwise documented below in the visit note. 

## 2014-12-09 NOTE — Assessment & Plan Note (Addendum)
Pt bp is not ideally controlled. Already on clonidine 0.2 mg po bid. Will add amlodipine 10 mg 1 tab a day.  If any neurologic or cardiac signs or symptoms with elevated bp then ED evaluation.  Follow up 2-3 wks or as needed.  You could also go ahead and schedule wellness exam and come in fasting.

## 2014-12-09 NOTE — Patient Instructions (Signed)
HTN (hypertension) Pt bp is not ideally controlled. Already on clonidine 0.2 mg po bid. Will add amlodipine 10 mg 1 tab a day.  If any neurologic or cardiac signs or symptoms with elevated bp then ED evaluation.  Follow up 2-3 wks or as needed.  You could also go ahead and schedule wellness exam and come in fasting.     Continue other tx for chronic conditions.

## 2014-12-09 NOTE — Progress Notes (Signed)
Subjective:    Patient ID: Derek Conner, male    DOB: 11-Jan-1961, 54 y.o.   MRN: [id number]  HPI  I have reviewed pt PMH, PSH, FH, Social History and Surgical History  Arhtritis- Pt states hx of. He used to be on humira. Pt was on for 2 years. Then stopped for 2 years. No problems since. Prior rheumatologist was local at [facility].  Pt copd/asthma- Pt hx of smoking. Started [age] yo. Was pack a day. Now only 7 cigarettes a day past 1-2 months. Pt on advair, albuterol and Spiriva. Pt see [facility].  Depression- When mother passed away. About 2-3 months.  Htn-Pt states on med for 2 years. Last couple of times he checked was high. Last 2 checks 176/94 and 160/105.  Pt states about one year ago was in hospital.  Smoker- Pt failed chantix. States caused a melt down.  Operations manager, no exercise, 2 caffeinated beverages a day, has girlfriend.   Review of Systems  Constitutional: Negative for fever, chills, diaphoresis, activity change and fatigue.  Respiratory: Negative for cough, chest tightness and shortness of breath.   Cardiovascular: Negative for chest pain, palpitations and leg swelling.  Gastrointestinal: Negative for nausea, vomiting and abdominal pain.  Musculoskeletal: Negative for neck pain and neck stiffness.  Neurological: Negative for dizziness, tremors, seizures, syncope, facial asymmetry, speech difficulty, weakness, light-headedness, numbness and headaches.  Psychiatric/Behavioral: Negative for behavioral problems, confusion and agitation. The patient is not nervous/anxious.     Past Medical History  Diagnosis Date  . COPD (chronic obstructive pulmonary disease)   . Emphysema lung   . Hypertension   . Asthma   . Rheumatoid arthritis     "qwhere" (12/04/2013)  . Depression   . Emphysema of lung     History   Social History  . Marital Status: Single    Spouse Name: N/A  . Number of Children: N/A  . Years of Education: N/A    Occupational History  . Not on file.   Social History Main Topics  . Smoking status: Current Every Day Smoker -- 0.50 packs/day for 37 years    Types: Cigarettes  . Smokeless tobacco: Never Used     Comment: down to 6 cigs/day 3.31.16  . Alcohol Use: 3.6 oz/week    6 Cans of beer per week     Comment: 12/04/2013 "6-8 beers/day"  . Drug Use: Yes    Special: Marijuana     Comment: 12/04/2013 "smoke pot a couple times/wk"  . Sexual Activity: Yes   Other Topics Concern  . Not on file   Social History Narrative    Past Surgical History  Procedure Laterality Date  . Appendectomy  1980's  . Foot fracture surgery Left ~ 2005    "it was crushed"  . Cystectomy Left 1960's    "wrist"  . Chest tube insertion Left 1990's    "lung collapsed"  . Video bronchoscopy Bilateral 12/06/2013    Procedure: VIDEO BRONCHOSCOPY WITHOUT FLUORO;  Surgeon: Alyson Reedy, MD;  Location: [facility] ENDOSCOPY;  Service: Cardiopulmonary;  Laterality: Bilateral;    Family History  Problem Relation Age of Onset  . Cancer Mother   . Arthritis Mother   . Stroke Father   . Hypertension Father     Allergies  Allergen Reactions  . Symbicort [Budesonide-Formoterol Fumarate] Shortness Of Breath, Nausea And Vomiting and Other (See Comments)    heachache  . Dulera [Mometasone Furo-Formoterol Fum]     Headache    Current  Outpatient Prescriptions on File Prior to Visit  Medication Sig Dispense Refill  . albuterol (PROVENTIL HFA;VENTOLIN HFA) 108 (90 BASE) MCG/ACT inhaler Inhale 2 puffs into the lungs every 4 (four) hours as needed for wheezing or shortness of breath.     . cloNIDine (CATAPRES) 0.1 MG tablet Take 0.2 mg by mouth 2 (two) times daily.    . Fluticasone-Salmeterol (ADVAIR DISKUS) 500-50 MCG/DOSE AEPB Inhale 1 puff into the lungs 2 (two) times daily. 2 each 0  . ibuprofen (ADVIL,MOTRIN) 200 MG tablet Take 200 mg by mouth every 6 (six) hours as needed.    . Multiple Vitamin (MULTIVITAMIN WITH MINERALS)  TABS tablet Take 1 tablet by mouth daily. 30 tablet 0  . nicotine (NICODERM CQ - DOSED IN MG/24 HOURS) 21 mg/24hr patch Use one 21mg  patch per day x 6 weeks, then decrease to 14mg  daily x 2 weeks, then 7mg  daily x 2 weeks. 28 patch 6  . thiamine 100 MG tablet Take 100 mg by mouth daily.    tiotropium (SPIRIVA) 18 MCG inhalation capsule Place 1 capsule (18 mcg total) into inhaler and inhale daily. 20 capsule 0  . doxycycline (VIBRA-TABS) 100 MG tablet Take 1 tablet, twice daily for 5 days. Avoid sunlight and take after meals. (Patient not taking: Reported on 12/09/2014) 10 tablet 0  . predniSONE (DELTASONE) 10 MG tablet 40 mg x1 day, then 30 mg x1 day, then 20 mg x1 day, then 10 mg x1 day, and then 5 mg x1 day and stop (Patient not taking: Reported on 12/09/2014) 11 tablet 0   No current facility-administered medications on file prior to visit.    Pulse 79  Temp(Src) 98.8 F (37.1 C) (Oral)  Ht 6' 0.5" (1.842 m)  Wt 190 lb (86.183 kg)  BMI 25.40 kg/m2  SpO2 97%       Objective:   Physical Exam  General Mental Status- Alert. General Appearance- Not in acute distress.   Skin General: Color- Normal Color. Moisture- Normal Moisture.  Neck Carotid Arteries- Normal color. Moisture- Normal Moisture. No carotid bruits. No JVD.  Chest and Lung Exam Auscultation: Breath Sounds:-Normal.  Cardiovascular Auscultation:Rythm- Regular. Murmurs & Other Heart Sounds:Auscultation of the heart reveals- No Murmurs.  Abdomen Inspection:-Inspeection Normal. Palpation/Percussion:Note:No mass. Palpation and Percussion of the abdomen reveal- Non Tender, Non Distended + BS, no rebound or guarding.    Neurologic Cranial Nerve exam:- CN III-XII intact(No nystagmus), symmetric smile. Drift Test:- No drift. Romberg Exam:- Negative.  Heal to Toe Gait exam:-Normal. Finger to Nose:- Normal/Intact Strength:- 5/5 equal and symmetric strength both upper and lower extremities.      Assessment & Plan:

## 2014-12-16 ENCOUNTER — Telehealth: Payer: Self-pay | Admitting: Internal Medicine

## 2014-12-16 NOTE — Telephone Encounter (Signed)
Samples of Spiriva and Advair will be left up front. Pt's wife is aware. Nothing further was needed.

## 2015-01-03 ENCOUNTER — Other Ambulatory Visit: Payer: Self-pay | Admitting: Internal Medicine

## 2015-01-04 ENCOUNTER — Telehealth: Payer: Self-pay | Admitting: Medical

## 2015-01-04 ENCOUNTER — Other Ambulatory Visit: Payer: Self-pay

## 2015-01-04 DIAGNOSIS — E785 Hyperlipidemia, unspecified: Secondary | ICD-10-CM

## 2015-01-04 MED ORDER — CLONIDINE HCL 0.1 MG PO TABS: 0.1000 mg | ORAL_TABLET | Freq: Two times a day (BID) | ORAL | Status: DC

## 2015-01-04 NOTE — Telephone Encounter (Signed)
Message to lpn. Regarding message sent to me? But no request or pending med?

## 2015-01-06 ENCOUNTER — Encounter: Payer: Self-pay | Admitting: Medical

## 2015-01-06 ENCOUNTER — Ambulatory Visit (INDEPENDENT_AMBULATORY_CARE_PROVIDER_SITE_OTHER): Payer: Managed Care, Other (non HMO) | Admitting: Medical

## 2015-01-06 VITALS — BP 160/100 | HR 79 | Temp 97.9°F | Ht 72.5 in | Wt 186.4 lb

## 2015-01-06 DIAGNOSIS — R21 Rash and other nonspecific skin eruption: Secondary | ICD-10-CM | POA: Diagnosis not present

## 2015-01-06 DIAGNOSIS — R739 Hyperglycemia, unspecified: Secondary | ICD-10-CM | POA: Insufficient documentation

## 2015-01-06 DIAGNOSIS — J209 Acute bronchitis, unspecified: Secondary | ICD-10-CM | POA: Diagnosis not present

## 2015-01-06 DIAGNOSIS — I10 Essential (primary) hypertension: Secondary | ICD-10-CM | POA: Diagnosis not present

## 2015-01-06 LAB — LIPID PANEL
CHOLESTEROL: 217 mg/dL — AB (ref 0–200)
HDL: 57.8 mg/dL (ref >39.00)
LDL Cholesterol: 134 mg/dL — ABNORMAL HIGH (ref 0–99)
NONHDL: 159.2
Total CHOL/HDL Ratio: 4
Triglycerides: 126 mg/dL (ref 0.0–149.0)
VLDL: 25.2 mg/dL (ref 0.0–40.0)

## 2015-01-06 LAB — HEMOGLOBIN A1C: HEMOGLOBIN A1C: 5.9 % (ref 4.6–6.5)

## 2015-01-06 MED ORDER — ATORVASTATIN CALCIUM 20 MG PO TABS: 20.0000 mg | ORAL_TABLET | Freq: Every day | ORAL | Status: DC

## 2015-01-06 MED ORDER — AZITHROMYCIN 250 MG PO TABS: ORAL_TABLET | ORAL | Status: DC

## 2015-01-06 MED ORDER — PREDNISONE 20 MG PO TABS: ORAL_TABLET | ORAL | Status: DC

## 2015-01-06 MED ORDER — LISINOPRIL 10 MG PO TABS: 10.0000 mg | ORAL_TABLET | Freq: Every day | ORAL | Status: DC

## 2015-01-06 MED ORDER — CLONIDINE HCL 0.2 MG PO TABS: 0.2000 mg | ORAL_TABLET | Freq: Two times a day (BID) | ORAL | Status: DC

## 2015-01-06 NOTE — Telephone Encounter (Signed)
-  Rx. lipitor 

## 2015-01-06 NOTE — Patient Instructions (Signed)
HTN (hypertension) My nurse bp reading 160/100. My readings 160/95, 140/90 and 150/90. Question maybe some white coat effect. With increaese of amlodipine expected decrease. I want his bp checked daily over next 3 days. If bp reading over 140/90 then add lisnipril 10 mg 1 tab a day. Give up update early next week on your readings.   Hyperglycemia In past on 3 separate occasions. Will get a1-c today.    Acute bronchitis Bronchit in smoker. Rx azithromycin. Continue inhalers. If wheezing worsens then start oral prednisone. But in that event would want cxr as well.     Follow up in 7 days if respiratory symptoms persist or as needed.  Htn follow up date to be determined depending on how bp readings are early next week(whether ace inhibitor was needed)

## 2015-01-06 NOTE — Assessment & Plan Note (Signed)
In past on 3 separate occasions. Will get a1-c today.

## 2015-01-06 NOTE — Assessment & Plan Note (Signed)
My nurse bp reading 160/100. My readings 160/95, 140/90 and 150/90. Question maybe some white coat effect. With increaese of amlodipine expected decrease. I want his bp checked daily over next 3 days. If bp reading over 140/90 then add lisnipril 10 mg 1 tab a day. Give up update early next week on your readings.

## 2015-01-06 NOTE — Progress Notes (Signed)
   Subjective:    Patient ID: Derek Conner, male    DOB: 1961/02/17, 54 y.o.   MRN: [id number]  HPI  Pt has not been checking his bp. No ha/neuro symptoms or cardiac symptoms. Pt did take his blood pressure med clonidine and amlodipine.  No recent lipid panel.  Pt past 3 days have felt mild chest congestion. Starting to cough up some mucous. No fever and no chills. Pt is a smoker. He is on spiriva.  Pt gets infections bronchitis about 3 times a year.    Review of Systems  Constitutional: Negative for fever, chills, diaphoresis, activity change and fatigue.  HENT: Positive for congestion. Negative for ear discharge, ear pain, postnasal drip, rhinorrhea, sinus pressure, sore throat and tinnitus.   Respiratory: Positive for cough and wheezing. Negative for chest tightness and shortness of breath.        Some recently. Worst last night.  Cardiovascular: Negative for chest pain, palpitations and leg swelling.  Gastrointestinal: Negative for nausea, vomiting and abdominal pain.  Musculoskeletal: Negative for neck pain and neck stiffness.  Neurological: Negative for dizziness, tremors, seizures, syncope, facial asymmetry, speech difficulty, weakness, light-headedness, numbness and headaches.  Hematological: Negative for adenopathy. Does not bruise/bleed easily.  Psychiatric/Behavioral: Negative for behavioral problems, confusion and agitation. The patient is not nervous/anxious.        Objective:   Physical Exam  General  Mental Status - Alert. General Appearance - Well groomed. Not in acute distress.  Skin Rashes- No Rashes.  HEENT Head- Normal. Ear Auditory Canal - Left- Normal. Right - Normal.Tympanic Membrane- Left- Normal. Right- Normal. Eye Sclera/Conjunctiva- Left- Normal. Right- Normal. Nose & Sinuses Nasal Mucosa- Left-  Not oggy or Congested. Right-  Not  boggy or Congested. Mouth & Throat Lips: Upper Lip- Normal: no dryness, cracking, pallor, cyanosis, or  vesicular eruption. Lower Lip-Normal: no dryness, cracking, pallor, cyanosis or vesicular eruption. Buccal Mucosa- Bilateral- No Aphthous ulcers. Oropharynx- No Discharge or Erythema. Tonsils: Characteristics- Bilateral- No Erythema or Congestion. Size/Enlargement- Bilateral- No enlargement. Discharge- bilateral-None.  Neck Neck- Supple. No Masses.   Chest and Lung Exam Auscultation: Breath Sounds:- even and unlabored, but bilateral upper lobe rhonchi. Faint expiratory wheeze.  Cardiovascular Auscultation:Rythm- Regular, rate and rhythm. Murmurs & Other Heart Sounds:Ausculatation of the heart reveal- No Murmurs.  Lymphatic Head & Neck General Head & Neck Lymphatics: Bilateral: Description- No Localized lymphadenopathy.  Skin- diffuse small scattere lesion on both arms(some features look molluscum like)        Assessment & Plan:

## 2015-01-06 NOTE — Progress Notes (Signed)
Pre visit review using our clinic review tool, if applicable. No additional management support is needed unless otherwise documented below in the visit note. 

## 2015-01-06 NOTE — Assessment & Plan Note (Signed)
Bronchit in smoker. Rx azithromycin. Continue inhalers. If wheezing worsens then start oral prednisone. But in that event would want cxr as well.

## 2015-01-12 ENCOUNTER — Other Ambulatory Visit: Payer: Self-pay | Admitting: Medical

## 2015-01-12 MED ORDER — ATORVASTATIN CALCIUM 20 MG PO TABS: 20.0000 mg | ORAL_TABLET | Freq: Every day | ORAL | Status: DC

## 2015-02-03 ENCOUNTER — Other Ambulatory Visit: Payer: Self-pay | Admitting: Medical

## 2015-02-09 ENCOUNTER — Telehealth: Payer: Self-pay | Admitting: Medical

## 2015-02-09 NOTE — Telephone Encounter (Signed)
Caller name: Lurena Joiner Relation to pt: Spouse Call back number: [phone] Pharmacy: [facility] [place]  - [address]  Reason for call: Pt's wife came in office stating had pick up her spouse rx for cloNIDine (CATAPRES) 0.2 MG tablet (that was sent on 02-03-15) and mentioned that pt does not want to call all the time for his rx to be refilled. Pt is requesting at least a three month refill. Spouse states it is a prescription that he needs all time. Please advise.

## 2015-02-09 NOTE — Telephone Encounter (Signed)
Called Pharmacy who states Patients Lisinopril and Clonopin have already been picked up at pharmacy. Patient should not be out of Clonidine at this time.

## 2015-02-10 NOTE — Telephone Encounter (Signed)
Note pt and family regarding bp checks.

## 2015-02-13 NOTE — Telephone Encounter (Signed)
Called patient to advise he will need to call prior to running out of medications for refills. Patient agreed.

## 2015-02-24 ENCOUNTER — Telehealth: Payer: Self-pay | Admitting: Internal Medicine

## 2015-02-24 MED ORDER — FLUTICASONE-SALMETEROL 500-50 MCG/DOSE IN AEPB: 1.0000 | INHALATION_SPRAY | Freq: Two times a day (BID) | RESPIRATORY_TRACT | Status: DC

## 2015-02-24 NOTE — Telephone Encounter (Signed)
Called spoke with pt. Aware no samples of spiriva or albuterol but will leave 2 samples of advair for pick up. Nothing further needed

## 2015-03-02 ENCOUNTER — Other Ambulatory Visit: Payer: Managed Care, Other (non HMO)

## 2015-03-03 ENCOUNTER — Other Ambulatory Visit: Payer: Self-pay | Admitting: Medical

## 2015-03-06 NOTE — Telephone Encounter (Signed)
Refilled pt bp meds. Notify pt.

## 2015-03-07 ENCOUNTER — Telehealth: Payer: Self-pay

## 2015-03-07 MED ORDER — CLONIDINE HCL 0.2 MG PO TABS: ORAL_TABLET | ORAL | Status: DC

## 2015-03-07 NOTE — Telephone Encounter (Signed)
Wife walked into clinic requesting to speak to manager.  She is requesting refill on clonidine stating every time she receives a refill it is only for 1 month supply.  Pt has taken last pill and needs a refill.  She is asking that we provide at least a 3 month supply.  New script sent to pharmacy.

## 2015-03-08 ENCOUNTER — Ambulatory Visit (INDEPENDENT_AMBULATORY_CARE_PROVIDER_SITE_OTHER)
Admission: RE | Admit: 2015-03-08 | Discharge: 2015-03-08 | Disposition: A | Discharge status: Home / Self Care | Payer: Managed Care, Other (non HMO) | Source: Ambulatory Visit | Attending: Internal Medicine | Admitting: Internal Medicine

## 2015-03-08 DIAGNOSIS — R911 Solitary pulmonary nodule: Secondary | ICD-10-CM | POA: Diagnosis not present

## 2015-03-12 ENCOUNTER — Telehealth: Payer: Self-pay | Admitting: Internal Medicine

## 2015-03-12 DIAGNOSIS — R911 Solitary pulmonary nodule: Secondary | ICD-10-CM

## 2015-03-12 NOTE — Telephone Encounter (Signed)
IMPRESSION: 1. Improve CT appearance of the right upper lobe ground-glass opacity most consistent with resolving infection. 2. Stable emphysematous changes and pulmonary scarring. 3. Multiple stable pulmonary nodules. 4. New 3 mm right upper lobe pulmonary nodule on image 32. 5. New area of probable peripheral mucous plugging in the right upper lobe on image 19. 6. Borderline enlarged gastrohepatic ligament lymph nodes. Recommend attention on future studies. 7. Recommend followup noncontrast chest CT in 6 months.   Electronically Signed By: Rudie Meyer M.D. On: 03/08/2015 12:35           Ct chest  - RUL opacity from his pneumonia is improving  - there is a small new 58mm nodule - given newness and extreme small size  - doubt cancer - there is some stomach area lymph nodes with borderline enlargement - he needs to talk to PCP Saguier, Ramon Dredge, PA-C about it. Not sure what it means  Plan   - recommend fu CT chest in 9-12 months - radiology reocmmending 6 months but I think 9-12 months wo contrast is ok

## 2015-03-13 NOTE — Telephone Encounter (Signed)
Called and spoke to pt. Informed him of the results and recs per MR. Order placed for f/u CT. Pt verbalized understanding and denied any further questions or concerns at this time.

## 2015-04-05 ENCOUNTER — Telehealth: Payer: Self-pay | Admitting: Medical

## 2015-04-05 NOTE — Telephone Encounter (Signed)
Error

## 2015-04-07 ENCOUNTER — Encounter: Payer: Self-pay | Admitting: Medical

## 2015-04-07 ENCOUNTER — Encounter: Payer: Self-pay | Admitting: Gastroenterology

## 2015-04-07 ENCOUNTER — Ambulatory Visit (INDEPENDENT_AMBULATORY_CARE_PROVIDER_SITE_OTHER): Payer: Managed Care, Other (non HMO) | Admitting: Medical

## 2015-04-07 VITALS — BP 126/87 | HR 88 | Temp 98.8°F | Ht 72.5 in | Wt 182.2 lb

## 2015-04-07 DIAGNOSIS — Z720 Tobacco use: Secondary | ICD-10-CM | POA: Diagnosis not present

## 2015-04-07 DIAGNOSIS — R599 Enlarged lymph nodes, unspecified: Secondary | ICD-10-CM

## 2015-04-07 DIAGNOSIS — R591 Generalized enlarged lymph nodes: Secondary | ICD-10-CM

## 2015-04-07 DIAGNOSIS — I10 Essential (primary) hypertension: Secondary | ICD-10-CM

## 2015-04-07 DIAGNOSIS — Z1211 Encounter for screening for malignant neoplasm of colon: Secondary | ICD-10-CM

## 2015-04-07 DIAGNOSIS — F17200 Nicotine dependence, unspecified, uncomplicated: Secondary | ICD-10-CM

## 2015-04-07 DIAGNOSIS — E785 Hyperlipidemia, unspecified: Secondary | ICD-10-CM

## 2015-04-07 MED ORDER — AMLODIPINE BESYLATE 10 MG PO TABS: ORAL_TABLET | ORAL | Status: DC

## 2015-04-07 MED ORDER — LISINOPRIL 10 MG PO TABS: ORAL_TABLET | ORAL | Status: DC

## 2015-04-07 MED ORDER — CLONIDINE HCL 0.2 MG PO TABS: ORAL_TABLET | ORAL | Status: DC

## 2015-04-07 MED ORDER — BUPROPION HCL ER (SR) 150 MG PO TB12: ORAL_TABLET | ORAL | Status: DC

## 2015-04-07 NOTE — Progress Notes (Signed)
Subjective:    Patient ID: Derek Conner, male    DOB: June 14, 1961, 54 y.o.   MRN: [id number]  HPI   Hypertension- pt blood pressure good today. He states bp has been lower than than on last visit. Checking more in the evening. Majority of his bp reading area similar to today readings.   Hyperglycemia- Pt a1-c was 5.9 in the summer.   Hyperlipidemia- Pt taking lipitor. No side effects. Admits not eating low cholesterol diet.  Pt has seen pulmonologist. Following some nodules on CT. Pt has some copd changes. On spiriva and advair. Smokes a pack a day. Pt on nicoderm. Tried various times. Pt tried chantix. Had side effects from that. Never tried wellbutrin. No seizure hx.  Pt has borderline enalarged gastrohepatic ligament lymph node.Pt has no reported GI symptoms. Pt uncles may have had colon cancer.(Pt is not sure). Pt denies reflux or abdominal pain. Next ct in 6 months.  Pt  Want to see Dr. Leone Payor GI MD.  Pt is willing to get  colonoscopy.    Review of Systems  Constitutional: Negative for fever, chills, diaphoresis, activity change and fatigue.  Respiratory: Negative for cough, chest tightness and shortness of breath.   Cardiovascular: Negative for chest pain, palpitations and leg swelling.  Gastrointestinal: Negative for nausea, vomiting, abdominal pain, diarrhea, constipation, blood in stool, anal bleeding and rectal pain.  Musculoskeletal: Negative for neck pain and neck stiffness.  Neurological: Negative for dizziness, syncope, weakness, light-headedness, numbness and headaches.  Psychiatric/Behavioral: Negative for behavioral problems, confusion and agitation. The patient is not nervous/anxious.    Past Medical History  Diagnosis Date  . COPD (chronic obstructive pulmonary disease)   . Emphysema lung   . Hypertension   . Asthma   . Rheumatoid arthritis     "qwhere" (12/04/2013)  . Depression   . Emphysema of lung     Social History   Social History  .  Marital Status: Single    Spouse Name: N/A  . Number of Children: N/A  . Years of Education: N/A   Occupational History  . Not on file.   Social History Main Topics  . Smoking status: Current Every Day Smoker -- 0.50 packs/day for 37 years    Types: Cigarettes  . Smokeless tobacco: Never Used     Comment: down to 6 cigs/day 3.31.16  . Alcohol Use: 3.6 oz/week    6 Cans of beer per week     Comment: 12/04/2013 "6-8 beers/day"  . Drug Use: Yes    Special: Marijuana     Comment: 12/04/2013 "smoke pot a couple times/wk"  . Sexual Activity: Yes   Other Topics Concern  . Not on file   Social History Narrative    Past Surgical History  Procedure Laterality Date  . Appendectomy  1980's  . Foot fracture surgery Left ~ 2005    "it was crushed"  . Cystectomy Left 1960's    "wrist"  . Chest tube insertion Left 1990's    "lung collapsed"  . Video bronchoscopy Bilateral 12/06/2013    Procedure: VIDEO BRONCHOSCOPY WITHOUT FLUORO;  Surgeon: Alyson Reedy, MD;  Location[facility] ENDOSCOPY;  Service: Cardiopulmonary;  Laterality: Bilateral;    Family History  Problem Relation Age of Onset  . Cancer Mother   . Arthritis Mother   . Stroke Father   . Hypertension Father     Allergies  Allergen Reactions  . Symbicort [Budesonide-Formoterol Fumarate] Shortness Of Breath, Nausea And Vomiting and Other (See Comments)  heachache  . Dulera [Mometasone Furo-Formoterol Fum]     Headache    Current Outpatient Prescriptions on File Prior to Visit  Medication Sig Dispense Refill  . albuterol (PROVENTIL HFA;VENTOLIN HFA) 108 (90 BASE) MCG/ACT inhaler Inhale 2 puffs into the lungs every 4 (four) hours as needed for wheezing or shortness of breath.     Marland Kitchen amLODipine (NORVASC) 10 MG tablet TAKE 1 TABLET (10 MG) BY MOUTH DAILY. 30 tablet 2  . atorvastatin (LIPITOR) 20 MG tablet Take 1 tablet (20 mg total) by mouth daily. 30 tablet 3  . cloNIDine (CATAPRES) 0.2 MG tablet TAKE 1 TABLET (0.2 MG) BY MOUTH  2 TIMES DAILY. 60 tablet 2  . Fluticasone-Salmeterol (ADVAIR DISKUS) 500-50 MCG/DOSE AEPB Inhale 1 puff into the lungs 2 (two) times daily. 2 each 0  . ibuprofen (ADVIL,MOTRIN) 200 MG tablet Take 200 mg by mouth every 6 (six) hours as needed.    Marland Kitchen lisinopril (PRINIVIL,ZESTRIL) 10 MG tablet TAKE 1 TABLET (10 MG) BY MOUTH DAILY. 30 tablet 1  . Multiple Vitamin (MULTIVITAMIN WITH MINERALS) TABS tablet Take 1 tablet by mouth daily. 30 tablet 0  . nicotine (NICODERM CQ - DOSED IN MG/24 HOURS) 21 mg/24hr patch Use one 21mg  patch per day x 6 weeks, then decrease to 14mg  daily x 2 weeks, then 7mg  daily x 2 weeks. 28 patch 6  . SPIRIVA HANDIHALER 18 MCG inhalation capsule PLACE 1 CAPSULE INTO INHALER AND INHALE INTO LUNGS DAILY. 30 capsule 6  . thiamine 100 MG tablet Take 100 mg by mouth daily.    tiotropium (SPIRIVA) 18 MCG inhalation capsule Place 1 capsule (18 mcg total) into inhaler and inhale daily. 20 capsule 0   No current facility-administered medications on file prior to visit.    BP 126/87 mmHg  Pulse 88  Temp(Src) 98.8 F (37.1 C) (Oral)  Ht 6' 0.5" (1.842 m)  Wt 182 lb 3.2 oz (82.645 kg)  BMI 24.36 kg/m2  SpO2 97%       Objective:   Physical Exam  General Mental Status- Alert. General Appearance- Not in acute distress.   Skin General: Color- Normal Color. Moisture- Normal Moisture.  Neck Carotid Arteries- Normal color. Moisture- Normal Moisture. No carotid bruits. No JVD.  Chest and Lung Exam Auscultation: Breath Sounds:-Normal. CTA.  Cardiovascular Auscultation:Rythm- Regular, Rate and Rhythm. Murmurs & Other Heart Sounds:Auscultation of the heart reveals- No Murmurs.  Abdomen Inspection:-Inspeection Normal. Palpation/Percussion:Note:No mass. Palpation and Percussion of the abdomen reveal- Non Tender, Non Distended + BS, no rebound or guarding.    Neurologic Cranial Nerve exam:- CN III-XII intact(No nystagmus), symmetric smile. Strength:- 5/5 equal and  symmetric strength both upper and lower extremities.      Assessment & Plan:  Your bp is well controlled and majority of your reading are good at home. Will refill your bp meds.  For your hyperlipidemia get cmp and lipid panel on Tuesday fasting. I placed future order. Schedule that as you leave.  Rx Wellbutrin for smoking. Start to taper down off cigarettes  in 2 wks after starting Wellbutrin.  Will refer to GI for screening colonsocpy and get their opinion on the borderline gastrohepatic ligament nodes.   Follow up 3-4 months for complete physical.

## 2015-04-07 NOTE — Patient Instructions (Addendum)
Your bp is well controlled and majority of your reading are good at home. Will refill your bp meds.  For your hyperlipidemia get cmp and lipid panel on Tuesday fasting. I placed future order. Schedule that as you leave.  Rx Wellbutrin for smoking. Start to taper down off cigarettes in  2 wks after starting Wellbutrin.  Will refer to GI for screening colonsocpy and get their opinion on the borderline gastrohepatic ligament nodes.   Follow up 3-4 months for complete physical.

## 2015-04-07 NOTE — Progress Notes (Signed)
Pre visit review using our clinic review tool, if applicable. No additional management support is needed unless otherwise documented below in the visit note. 

## 2015-04-11 ENCOUNTER — Other Ambulatory Visit (INDEPENDENT_AMBULATORY_CARE_PROVIDER_SITE_OTHER): Payer: Managed Care, Other (non HMO)

## 2015-04-11 DIAGNOSIS — E785 Hyperlipidemia, unspecified: Secondary | ICD-10-CM | POA: Diagnosis not present

## 2015-04-11 DIAGNOSIS — I10 Essential (primary) hypertension: Secondary | ICD-10-CM | POA: Diagnosis not present

## 2015-04-11 LAB — LIPID PANEL
Cholesterol: 148 mg/dL (ref 0–200)
HDL: 50.7 mg/dL (ref >39.00)
NonHDL: 96.84
Total CHOL/HDL Ratio: 3
Triglycerides: 296 mg/dL — ABNORMAL HIGH (ref 0.0–149.0)
VLDL: 59.2 mg/dL — ABNORMAL HIGH (ref 0.0–40.0)

## 2015-04-11 LAB — COMPREHENSIVE METABOLIC PANEL
ALT: 17 U/L (ref 0–53)
AST: 17 U/L (ref 0–37)
Albumin: 4.2 g/dL (ref 3.5–5.2)
Alkaline Phosphatase: 57 U/L (ref 39–117)
BUN: 19 mg/dL (ref 6–23)
CO2: 25 mEq/L (ref 19–32)
Calcium: 9.6 mg/dL (ref 8.4–10.5)
Chloride: 103 mEq/L (ref 96–112)
Creatinine, Ser: 1.37 mg/dL (ref 0.40–1.50)
GFR: 57.39 mL/min — ABNORMAL LOW (ref >60.00)
Glucose, Bld: 97 mg/dL (ref 70–99)
Potassium: 4.4 mEq/L (ref 3.5–5.1)
Sodium: 136 mEq/L (ref 135–145)
Total Bilirubin: 0.3 mg/dL (ref 0.2–1.2)
Total Protein: 6.9 g/dL (ref 6.0–8.3)

## 2015-04-11 LAB — LDL CHOLESTEROL, DIRECT: Direct LDL: 69 mg/dL

## 2015-04-11 MED ORDER — ATORVASTATIN CALCIUM 20 MG PO TABS: 20.0000 mg | ORAL_TABLET | Freq: Every day | ORAL | Status: DC

## 2015-04-11 NOTE — Telephone Encounter (Signed)
rx lipitor 

## 2015-04-12 ENCOUNTER — Other Ambulatory Visit: Payer: Managed Care, Other (non HMO)

## 2015-04-12 NOTE — Addendum Note (Signed)
Addended by: Neldon Labella on: 04/12/2015 10:34 AM   Modules accepted: Orders

## 2015-05-30 ENCOUNTER — Ambulatory Visit (AMBULATORY_SURGERY_CENTER): Payer: Self-pay

## 2015-05-30 VITALS — Ht 71.0 in | Wt 179.0 lb

## 2015-05-30 DIAGNOSIS — Z1211 Encounter for screening for malignant neoplasm of colon: Secondary | ICD-10-CM

## 2015-05-30 MED ORDER — SUPREP BOWEL PREP KIT 17.5-3.13-1.6 GM/177ML PO SOLN: 1.0000 | Freq: Once | ORAL | Status: DC

## 2015-05-30 NOTE — Progress Notes (Signed)
No allergies to eggs or soy No diet/weight loss meds No past problems with anesthesia No home oxygen  Refused emmi; has internet 

## 2015-06-07 ENCOUNTER — Other Ambulatory Visit: Payer: Self-pay

## 2015-06-07 MED ORDER — BUPROPION HCL ER (SR) 150 MG PO TB12
ORAL_TABLET | ORAL | Status: DC
Start: 2015-06-07 — End: 2015-08-29

## 2015-06-12 ENCOUNTER — Other Ambulatory Visit: Payer: Self-pay | Admitting: Internal Medicine

## 2015-06-13 ENCOUNTER — Encounter: Payer: Self-pay | Admitting: Gastroenterology

## 2015-06-13 ENCOUNTER — Ambulatory Visit (AMBULATORY_SURGERY_CENTER): Payer: Managed Care, Other (non HMO) | Admitting: Gastroenterology

## 2015-06-13 VITALS — BP 117/71 | HR 68 | Temp 97.4°F | Resp 17 | Ht 71.0 in | Wt 179.0 lb

## 2015-06-13 DIAGNOSIS — Z1211 Encounter for screening for malignant neoplasm of colon: Secondary | ICD-10-CM | POA: Diagnosis present

## 2015-06-13 MED ORDER — SODIUM CHLORIDE 0.9 % IV SOLN: 500.0000 mL | INTRAVENOUS | Status: DC

## 2015-06-13 NOTE — Patient Instructions (Signed)
YOU HAD AN ENDOSCOPIC PROCEDURE TODAY AT THE [facility]:   Refer to the procedure report that was given to you for any specific questions about what was found during the examination.  If the procedure report does not answer your questions, please call your gastroenterologist to clarify.  If you requested that your care partner not be given the details of your procedure findings, then the procedure report has been included in a sealed envelope for you to review at your convenience later.  YOU SHOULD EXPECT: Some feelings of bloating in the abdomen. Passage of more gas than usual.  Walking can help get rid of the air that was put into your GI tract during the procedure and reduce the bloating. If you had a lower endoscopy (such as a colonoscopy or flexible sigmoidoscopy) you may notice spotting of blood in your stool or on the toilet paper. If you underwent a bowel prep for your procedure, you may not have a normal bowel movement for a few days.  Please Note:  You might notice some irritation and congestion in your nose or some drainage.  This is from the oxygen used during your procedure.  There is no need for concern and it should clear up in a day or so.  SYMPTOMS TO REPORT IMMEDIATELY:   Following lower endoscopy (colonoscopy or flexible sigmoidoscopy):  Excessive amounts of blood in the stool  Significant tenderness or worsening of abdominal pains  Swelling of the abdomen that is new, acute  Fever of 100F or higher   For urgent or emergent issues, a gastroenterologist can be reached at any hour by calling [phone].   DIET: Your first meal following the procedure should be a small meal and then it is ok to progress to your normal diet. Heavy or fried foods are harder to digest and may make you feel nauseous or bloated.  Likewise, meals heavy in dairy and vegetables can increase bloating.  Drink plenty of fluids but you should avoid alcoholic beverages for 24  hours.  ACTIVITY:  You should plan to take it easy for the rest of today and you should NOT DRIVE or use heavy machinery until tomorrow (because of the sedation medicines used during the test).    FOLLOW UP: Our staff will call the number listed on your records the next business day following your procedure to check on you and address any questions or concerns that you may have regarding the information given to you following your procedure. If we do not reach you, we will leave a message.  However, if you are feeling well and you are not experiencing any problems, there is no need to return our call.  We will assume that you have returned to your regular daily activities without incident.  If any biopsies were taken you will be contacted by phone or by letter within the next 1-3 weeks.  Please call us at [phone] if you have not heard about the biopsies in 3 weeks.    SIGNATURES/CONFIDENTIALITY: You and/or your care partner have signed paperwork which will be entered into your electronic medical record.  These signatures attest to the fact that that the information above on your After Visit Summary has been reviewed and is understood.  Full responsibility of the confidentiality of this discharge information lies with you and/or your care-partner.   Resume medications. 

## 2015-06-13 NOTE — Progress Notes (Signed)
A/ox3 pleased with MAC, report to Sheila RN 

## 2015-06-13 NOTE — Op Note (Signed)
[facility] [address] [place], [address]   COLONOSCOPY PROCEDURE REPORT  PATIENT: Derek, Conner  MR#: [id number] BIRTHDATE: 13-Apr-1961 , 53  yrs. old GENDER: male ENDOSCOPIST: Rachael Fee, MD REFERRED BY: Esperanza Richters, MD PROCEDURE DATE:  06/13/2015 PROCEDURE:   Colonoscopy, screening First Screening Colonoscopy - Avg.  risk and is 50 yrs.  old or older Yes.  Prior Negative Screening - Now for repeat screening. N/A  History of Adenoma - Now for follow-up colonoscopy & has been > or = to 3 yrs.  N/A  Recommend repeat exam, <10 yrs? No ASA CLASS:   Class II INDICATIONS:Screening for colonic neoplasia and Colorectal Neoplasm Risk Assessment for this procedure is average risk. MEDICATIONS: Propofol 250 mg IV  DESCRIPTION OF PROCEDURE:   After the risks benefits and alternatives of the procedure were thoroughly explained, informed consent was obtained.  The digital rectal exam revealed no abnormalities of the rectum.   The LB [id number]  endoscope was introduced through the anus and advanced to the cecum, which was identified by both the appendix and ileocecal valve. No adverse events experienced.   The quality of the prep was adequate  The instrument was then slowly withdrawn as the colon was fully examined. Estimated blood loss is zero unless otherwise noted in this procedure report.   COLON FINDINGS: A normal appearing cecum, ileocecal valve, and appendiceal orifice were identified.  The ascending, transverse, descending, sigmoid colon, and rectum appeared unremarkable. Retroflexed views revealed no abnormalities. The time to cecum = 2.2 Withdrawal time = 12.4   The scope was withdrawn and the procedure completed. COMPLICATIONS: There were no immediate complications.  ENDOSCOPIC IMPRESSION: Normal colonoscopy No polyps or cancers  RECOMMENDATIONS: You should continue to follow colorectal cancer screening guidelines for "routine  risk" patients with a repeat colonoscopy in 10 years.   eSigned:  Rachael Fee, MD 06/13/2015 11:17 AM

## 2015-06-14 ENCOUNTER — Telehealth: Payer: Self-pay | Admitting: *Deleted

## 2015-06-14 NOTE — Telephone Encounter (Signed)
  Follow up Call-  Call back number 06/13/2015  Post procedure Call Back phone  # [phone]  Permission to leave phone message Yes     Patient questions:  Do you have a fever, pain , or abdominal swelling? No. Pain Score  0 *  Have you tolerated food without any problems? Yes.    Have you been able to return to your normal activities? Yes.    Do you have any questions about your discharge instructions: Diet   No. Medications  No. Follow up visit  No.  Do you have questions or concerns about your Care? No.  Actions: * If pain score is 4 or above: No action needed, pain <4.

## 2015-06-28 ENCOUNTER — Encounter: Payer: Self-pay | Admitting: Medical

## 2015-06-28 ENCOUNTER — Ambulatory Visit (INDEPENDENT_AMBULATORY_CARE_PROVIDER_SITE_OTHER): Payer: Managed Care, Other (non HMO) | Admitting: Medical

## 2015-06-28 VITALS — BP 110/70 | HR 67 | Temp 97.8°F | Ht 72.5 in | Wt 176.0 lb

## 2015-06-28 DIAGNOSIS — I10 Essential (primary) hypertension: Secondary | ICD-10-CM | POA: Diagnosis not present

## 2015-06-28 DIAGNOSIS — F17200 Nicotine dependence, unspecified, uncomplicated: Secondary | ICD-10-CM

## 2015-06-28 DIAGNOSIS — J209 Acute bronchitis, unspecified: Secondary | ICD-10-CM | POA: Diagnosis not present

## 2015-06-28 DIAGNOSIS — Z720 Tobacco use: Secondary | ICD-10-CM

## 2015-06-28 MED ORDER — BENZONATATE 100 MG PO CAPS: 200.0000 mg | ORAL_CAPSULE | Freq: Three times a day (TID) | ORAL | Status: DC | PRN

## 2015-06-28 MED ORDER — AZITHROMYCIN 250 MG PO TABS: ORAL_TABLET | ORAL | Status: DC

## 2015-06-28 MED ORDER — FLUTICASONE-SALMETEROL 500-50 MCG/DOSE IN AEPB: INHALATION_SPRAY | RESPIRATORY_TRACT | Status: DC

## 2015-06-28 MED ORDER — ALBUTEROL SULFATE HFA 108 (90 BASE) MCG/ACT IN AERS: 2.0000 | INHALATION_SPRAY | Freq: Four times a day (QID) | RESPIRATORY_TRACT | Status: DC | PRN

## 2015-06-28 MED ORDER — ATORVASTATIN CALCIUM 20 MG PO TABS: 20.0000 mg | ORAL_TABLET | Freq: Every day | ORAL | Status: DC

## 2015-06-28 MED ORDER — LISINOPRIL 10 MG PO TABS: ORAL_TABLET | ORAL | Status: DC

## 2015-06-28 MED ORDER — AMLODIPINE BESYLATE 10 MG PO TABS: ORAL_TABLET | ORAL | Status: DC

## 2015-06-28 MED ORDER — TIOTROPIUM BROMIDE MONOHYDRATE 18 MCG IN CAPS: ORAL_CAPSULE | RESPIRATORY_TRACT | Status: DC

## 2015-06-28 MED ORDER — NICOTINE 21 MG/24HR TD PT24: MEDICATED_PATCH | TRANSDERMAL | Status: DC

## 2015-06-28 NOTE — Patient Instructions (Addendum)
For bronchitis which may be very early will rx azithomycin. Start tomorrow if symptoms persist.  Hycodan for cough if needed.  For your copd, will refill your inhalers.  For your smoking consider using nicoderm patch.  For your bp pressure continue current bp meds. Check bp daily. If systolic lower than 120 go ahead and decrease clonodine to 0.1 mg twice daily.  Follow up in January for physical or as needed before for today bronchitis.

## 2015-06-28 NOTE — Progress Notes (Signed)
Pre visit review using our clinic review tool, if applicable. No additional management support is needed unless otherwise documented below in the visit note. 

## 2015-06-28 NOTE — Progress Notes (Signed)
Subjective:    Patient ID: Derek Conner, male    DOB: 1960-08-29, 54 y.o.   MRN: [id number]  HPI   Pt in with cough. Cough for about 3 days. Pt states not productive cough. No fever or chills. Feels mild chest congestion.  No sinus pressure. No ear pain.  Pt does get bronchitis about 2 times a year. Last year he thinks had bronchitis this time of the year.   Pt still smokes. Pt tried Wellbutrin but he is still smokes about the same.  Pt has been working a lot and stressed.  He expresses since holidays coming he does not want to get sick and not have antibiotic available in light of his history.   Review of Systems  Constitutional: Negative for fever, chills, diaphoresis, activity change and fatigue.  HENT: Negative for congestion, ear pain, facial swelling, mouth sores, nosebleeds, postnasal drip and sinus pressure.   Respiratory: Positive for cough. Negative for chest tightness and shortness of breath.   Cardiovascular: Negative for chest pain, palpitations and leg swelling.  Gastrointestinal: Negative for nausea, vomiting and abdominal pain.  Musculoskeletal: Negative for neck pain and neck stiffness.  Neurological: Negative for dizziness, seizures, weakness and headaches.  Psychiatric/Behavioral: Negative for behavioral problems, confusion and agitation. The patient is not nervous/anxious.     Past Medical History  Diagnosis Date  . COPD (chronic obstructive pulmonary disease) (HCC)   . Emphysema lung (HCC)   . Hypertension   . Asthma   . Rheumatoid arthritis (HCC)     "qwhere" (12/04/2013)  . Depression   . Emphysema of lung [facility])     Social History   Social History  . Marital Status: Single    Spouse Name: N/A  . Number of Children: N/A  . Years of Education: N/A   Occupational History  . Not on file.   Social History Main Topics  . Smoking status: Current Every Day Smoker -- 0.50 packs/day for 37 years    Types: Cigarettes  . Smokeless tobacco:  Never Used     Comment: down to 6 cigs/day 3.31.16  . Alcohol Use: 3.6 oz/week    6 Cans of beer per week     Comment: 12/04/2013 "6-8 beers/day"  . Drug Use: Yes    Special: Marijuana     Comment: 12/04/2013 "smoke pot a couple times/wk"  . Sexual Activity: Yes   Other Topics Concern  . Not on file   Social History Narrative    Past Surgical History  Procedure Laterality Date  . Appendectomy  1980's  . Foot fracture surgery Left ~ 2005    "it was crushed"  . Cystectomy Left 1960's    "wrist"  . Chest tube insertion Left 1990's    "lung collapsed"  . Video bronchoscopy Bilateral 12/06/2013    Procedure: VIDEO BRONCHOSCOPY WITHOUT FLUORO;  Surgeon: Alyson Reedy, MD;  Location: [facility] ENDOSCOPY;  Service: Cardiopulmonary;  Laterality: Bilateral;    Family History  Problem Relation Age of Onset  . Cancer Mother   . Arthritis Mother   . Stroke Father   . Hypertension Father   . Colon cancer Neg Hx     Allergies  Allergen Reactions  . Symbicort [Budesonide-Formoterol Fumarate] Shortness Of Breath, Nausea And Vomiting and Other (See Comments)    heachache  . Dulera [Mometasone Furo-Formoterol Fum]     Headache    Current Outpatient Prescriptions on File Prior to Visit  Medication Sig Dispense Refill  . ADVAIR DISKUS 500-50  MCG/DOSE AEPB INHALE 1 PUFF BY MOUTH INTO THE LUNGS 2 TIMES DAILY. 60 each 6  . albuterol (PROVENTIL HFA;VENTOLIN HFA) 108 (90 BASE) MCG/ACT inhaler Inhale 2 puffs into the lungs every 4 (four) hours as needed for wheezing or shortness of breath.     Marland Kitchen amLODipine (NORVASC) 10 MG tablet TAKE 1 TABLET (10 MG) BY MOUTH DAILY. 90 tablet 1  . atorvastatin (LIPITOR) 20 MG tablet Take 1 tablet (20 mg total) by mouth daily. 30 tablet 3  . b complex vitamins tablet Take 1 tablet by mouth daily.    Marland Kitchen buPROPion (WELLBUTRIN SR) 150 MG 12 hr tablet 1 tab po bid 60 tablet 1  . cloNIDine (CATAPRES) 0.2 MG tablet TAKE 1 TABLET (0.2 MG) BY MOUTH 2 TIMES DAILY. 180 tablet 1    . ibuprofen (ADVIL,MOTRIN) 200 MG tablet Take 200 mg by mouth every 6 (six) hours as needed.    Marland Kitchen lisinopril (PRINIVIL,ZESTRIL) 10 MG tablet TAKE 1 TABLET (10 MG) BY MOUTH DAILY. 90 tablet 1  . Multiple Vitamin (MULTIVITAMIN WITH MINERALS) TABS tablet Take 1 tablet by mouth daily. 30 tablet 0  . SPIRIVA HANDIHALER 18 MCG inhalation capsule PLACE 1 CAPSULE INTO INHALER AND INHALE INTO LUNGS DAILY. 30 capsule 6   No current facility-administered medications on file prior to visit.    BP 100/64 mmHg  Pulse 67  Temp(Src) 97.8 F (36.6 C) (Oral)  Ht 6' 0.5" (1.842 m)  Wt 176 lb (79.833 kg)  BMI 23.53 kg/m2  SpO2 97%       Objective:   Physical Exam  General  Mental Status - Alert. General Appearance - Well groomed. Not in acute distress.  Skin Rashes- No Rashes.  HEENT Head- Normal. Ear Auditory Canal - Left- Normal. Right - Normal.Tympanic Membrane- Left- Normal. Right- Normal. Eye Sclera/Conjunctiva- Left- Normal. Right- Normal. Nose & Sinuses Nasal Mucosa- Left-  Not oggy or Congested. Right-  Not  boggy or Congested. Mouth & Throat Lips: Upper Lip- Normal: no dryness, cracking, pallor, cyanosis, or vesicular eruption. Lower Lip-Normal: no dryness, cracking, pallor, cyanosis or vesicular eruption. Buccal Mucosa- Bilateral- No Aphthous ulcers. Oropharynx- No Discharge or Erythema. Tonsils: Characteristics- Bilateral- No Erythema or Congestion. Size/Enlargement- Bilateral- No enlargement. Discharge- bilateral-None.  Neck Neck- Supple. No Masses.   Chest and Lung Exam Auscultation: Breath Sounds:- even and unlabored, but bilateral upper lobe rhonchi.  Cardiovascular Auscultation:Rythm- Regular, rate and rhythm. Murmurs & Other Heart Sounds:Ausculatation of the heart reveal- No Murmurs.  Lymphatic Head & Neck General Head & Neck Lymphatics: Bilateral: Description- No Localized lymphadenopathy.       Assessment & Plan:  For bronchitis which may be very early  will rx azithomycin. Start tomorrow if symptoms persist.  Hycodan for cough if needed.  For your copd, will refill your inhalers.  For your smoking consider using nicoderm patch.  For your bp pressure continue current bp meds. Check bp daily. If systolic lower than 120 go ahead and decrease clonodine to 0.1 mg twice daily.  Follow up in January for physical or as needed before for today bronchitis.

## 2015-07-12 ENCOUNTER — Other Ambulatory Visit: Payer: Managed Care, Other (non HMO)

## 2015-07-18 ENCOUNTER — Telehealth: Payer: Self-pay | Admitting: Internal Medicine

## 2015-07-18 MED ORDER — FLUTICASONE-SALMETEROL 500-50 MCG/DOSE IN AEPB: INHALATION_SPRAY | RESPIRATORY_TRACT | Status: DC

## 2015-07-18 NOTE — Telephone Encounter (Signed)
Spoke with spouse and aware 2 samples of Advair left for pick up. Nothing further needed

## 2015-07-26 ENCOUNTER — Telehealth: Payer: Self-pay | Admitting: Medical

## 2015-08-08 MED FILL — LISINOPRIL 10 MG TABLET: 10 | 90 days supply | Qty: 90 | Fill #0

## 2015-08-08 MED FILL — AMLODIPINE BESYLATE 10 MG T: 10 | 90 days supply | Qty: 90 | Fill #0

## 2015-08-08 MED FILL — ATORVASTATIN 20 MG TABLET: 20 | 30 days supply | Qty: 30 | Fill #1

## 2015-08-08 MED FILL — cloNIDine HCL 0.2 MG TABS: 0.2 | 90 days supply | Qty: 180 | Fill #1

## 2015-08-08 MED FILL — SPIRIVA 18 MCG CP-HANDIHALE: 18 | 30 days supply | Qty: 30 | Fill #1

## 2015-08-08 MED FILL — VENTOLIN HFA 90 MCG INHALER: 108 (90 BAS | 30 days supply | Qty: 18 | Fill #0

## 2015-08-28 ENCOUNTER — Telehealth: Payer: Self-pay | Admitting: *Deleted

## 2015-08-28 NOTE — Telephone Encounter (Signed)
Unable to reach the patient at Pre-visit call; left a message to give [place] a call back.

## 2015-08-29 ENCOUNTER — Ambulatory Visit (INDEPENDENT_AMBULATORY_CARE_PROVIDER_SITE_OTHER): Payer: 59 | Admitting: Medical

## 2015-08-29 ENCOUNTER — Encounter: Payer: Self-pay | Admitting: Medical

## 2015-08-29 VITALS — BP 130/78 | HR 71 | Temp 97.6°F | Ht 73.0 in | Wt 183.0 lb

## 2015-08-29 DIAGNOSIS — Z0189 Encounter for other specified special examinations: Secondary | ICD-10-CM | POA: Diagnosis not present

## 2015-08-29 DIAGNOSIS — Z0000 Encounter for general adult medical examination without abnormal findings: Secondary | ICD-10-CM

## 2015-08-29 DIAGNOSIS — Z113 Encounter for screening for infections with a predominantly sexual mode of transmission: Secondary | ICD-10-CM

## 2015-08-29 DIAGNOSIS — R8299 Other abnormal findings in urine: Secondary | ICD-10-CM | POA: Diagnosis not present

## 2015-08-29 DIAGNOSIS — Z23 Encounter for immunization: Secondary | ICD-10-CM | POA: Diagnosis not present

## 2015-08-29 DIAGNOSIS — R8290 Unspecified abnormal findings in urine: Secondary | ICD-10-CM | POA: Diagnosis not present

## 2015-08-29 LAB — POCT URINALYSIS DIPSTICK
BILIRUBIN UA: NEGATIVE
Blood, UA: NEGATIVE
Glucose, UA: NEGATIVE
KETONES UA: NEGATIVE
NITRITE UA: NEGATIVE
PH UA: 6
PROTEIN UA: NEGATIVE
Spec Grav, UA: 1.02
Urobilinogen, UA: 0.2

## 2015-08-29 LAB — CBC WITH DIFFERENTIAL/PLATELET
BASOS ABS: 0.1 10*3/uL (ref 0.0–0.1)
BASOS PCT: 0.6 % (ref 0.0–3.0)
EOS ABS: 0.3 10*3/uL (ref 0.0–0.7)
Eosinophils Relative: 2.8 % (ref 0.0–5.0)
HCT: 41.2 % (ref 39.0–52.0)
HEMOGLOBIN: 13.5 g/dL (ref 13.0–17.0)
Lymphocytes Relative: 29.9 % (ref 12.0–46.0)
Lymphs Abs: 3.1 10*3/uL (ref 0.7–4.0)
MCHC: 32.8 g/dL (ref 30.0–36.0)
MCV: 87.8 fl (ref 78.0–100.0)
MONO ABS: 0.8 10*3/uL (ref 0.1–1.0)
Monocytes Relative: 8.1 % (ref 3.0–12.0)
Neutro Abs: 6 10*3/uL (ref 1.4–7.7)
Neutrophils Relative %: 58.6 % (ref 43.0–77.0)
PLATELETS: 313 10*3/uL (ref 150.0–400.0)
RBC: 4.69 Mil/uL (ref 4.22–5.81)
RDW: 14.5 % (ref 11.5–15.5)
WBC: 10.3 10*3/uL (ref 4.0–10.5)

## 2015-08-29 LAB — COMPREHENSIVE METABOLIC PANEL
ALBUMIN: 4.3 g/dL (ref 3.5–5.2)
ALT: 22 U/L (ref 0–53)
AST: 20 U/L (ref 0–37)
Alkaline Phosphatase: 65 U/L (ref 39–117)
BUN: 19 mg/dL (ref 6–23)
CO2: 26 meq/L (ref 19–32)
CREATININE: 1.33 mg/dL (ref 0.40–1.50)
Calcium: 9.7 mg/dL (ref 8.4–10.5)
Chloride: 105 mEq/L (ref 96–112)
GFR: 59.3 mL/min — ABNORMAL LOW (ref >60.00)
Glucose, Bld: 106 mg/dL — ABNORMAL HIGH (ref 70–99)
Potassium: 4.8 mEq/L (ref 3.5–5.1)
SODIUM: 137 meq/L (ref 135–145)
Total Bilirubin: 0.6 mg/dL (ref 0.2–1.2)
Total Protein: 6.8 g/dL (ref 6.0–8.3)

## 2015-08-29 LAB — LIPID PANEL
CHOL/HDL RATIO: 3
Cholesterol: 154 mg/dL (ref 0–200)
HDL: 61.4 mg/dL (ref >39.00)
LDL Cholesterol: 78 mg/dL (ref 0–99)
NONHDL: 93.05
Triglycerides: 77 mg/dL (ref 0.0–149.0)
VLDL: 15.4 mg/dL (ref 0.0–40.0)

## 2015-08-29 LAB — PSA: PSA: 1.84 ng/mL (ref 0.10–4.00)

## 2015-08-29 LAB — TSH: TSH: 1.82 u[IU]/mL (ref 0.35–4.50)

## 2015-08-29 MED ORDER — AMLODIPINE BESYLATE 10 MG PO TABS: ORAL_TABLET | ORAL | Status: DC

## 2015-08-29 MED ORDER — NICOTINE 14 MG/24HR TD PT24: 14.0000 mg | MEDICATED_PATCH | Freq: Every day | TRANSDERMAL | Status: DC

## 2015-08-29 MED ORDER — LISINOPRIL 10 MG PO TABS: ORAL_TABLET | ORAL | Status: DC

## 2015-08-29 MED ORDER — CLONIDINE HCL 0.1 MG PO TABS: 0.1000 mg | ORAL_TABLET | Freq: Two times a day (BID) | ORAL | Status: DC

## 2015-08-29 MED ORDER — FLUTICASONE-SALMETEROL 500-50 MCG/DOSE IN AEPB: INHALATION_SPRAY | RESPIRATORY_TRACT | Status: DC

## 2015-08-29 MED FILL — NICOTINE 14 MG/24HR PATCH: 14 | 28 days supply | Qty: 28 | Fill #0

## 2015-08-29 MED FILL — cloNIDine HCL 0.1 MG TABS: 0.1 | 90 days supply | Qty: 180 | Fill #0

## 2015-08-29 NOTE — Progress Notes (Signed)
Pre visit review using our clinic review tool, if applicable. No additional management support is needed unless otherwise documented below in the visit note. 

## 2015-08-29 NOTE — Assessment & Plan Note (Signed)
Cbc cmp, tsh, psa, lipid panel, hiv, ua, tdap today.

## 2015-08-29 NOTE — Patient Instructions (Addendum)
Wellness examination Cbc cmp, tsh, psa, lipid panel, hiv, ua, tdap today.   Tdap. Today.  Refilled your bp meds. Will waiit on lipid panel before filling lipid medication.  Check bp as you have but check when relaxed and before smoking. Rx for nicoderm patch.  Follow up to be determined after lab review.  Preventive Care for Adults, Male A healthy lifestyle and preventive care can promote health and wellness. Preventive health guidelines for men include the following key practices:  A routine yearly physical is a good way to check with your health care provider about your health and preventative screening. It is a chance to share any concerns and updates on your health and to receive a thorough exam.  Visit your dentist for a routine exam and preventative care every 6 months. Brush your teeth twice a day and floss once a day. Good oral hygiene prevents tooth decay and gum disease.  The frequency of eye exams is based on your age, health, family medical history, use of contact lenses, and other factors. Follow your health care provider's recommendations for frequency of eye exams.  Eat a healthy diet. Foods such as vegetables, fruits, whole grains, low-fat dairy products, and lean protein foods contain the nutrients you need without too many calories. Decrease your intake of foods high in solid fats, added sugars, and salt. Eat the right amount of calories for you.Get information about a proper diet from your health care provider, if necessary.  Regular physical exercise is one of the most important things you can do for your health. Most adults should get at least 150 minutes of moderate-intensity exercise (any activity that increases your heart rate and causes you to sweat) each week. In addition, most adults need muscle-strengthening exercises on 2 or more days a week.  Maintain a healthy weight. The body mass index (BMI) is a screening tool to identify possible weight problems. It  provides an estimate of body fat based on height and weight. Your health care provider can find your BMI and can help you achieve or maintain a healthy weight.For adults 20 years and older:  A BMI below 18.5 is considered underweight.  A BMI of 18.5 to 24.9 is normal.  A BMI of 25 to 29.9 is considered overweight.  A BMI of 30 and above is considered obese.  Maintain normal blood lipids and cholesterol levels by exercising and minimizing your intake of saturated fat. Eat a balanced diet with plenty of fruit and vegetables. Blood tests for lipids and cholesterol should begin at age 48 and be repeated every 5 years. If your lipid or cholesterol levels are high, you are over 50, or you are at high risk for heart disease, you may need your cholesterol levels checked more frequently.Ongoing high lipid and cholesterol levels should be treated with medicines if diet and exercise are not working.  If you smoke, find out from your health care provider how to quit. If you do not use tobacco, do not start.  Lung cancer screening is recommended for adults aged 10-80 years who are at high risk for developing lung cancer because of a history of smoking. A yearly low-dose CT scan of the lungs is recommended for people who have at least a 30-pack-year history of smoking and are a current smoker or have quit within the past 15 years. A pack year of smoking is smoking an average of 1 pack of cigarettes a day for 1 year (for example: 1 pack a day for  30 years or 2 packs a day for 15 years). Yearly screening should continue until the smoker has stopped smoking for at least 15 years. Yearly screening should be stopped for people who develop a health problem that would prevent them from having lung cancer treatment.  If you choose to drink alcohol, do not have more than 2 drinks per day. One drink is considered to be 12 ounces (355 mL) of beer, 5 ounces (148 mL) of wine, or 1.5 ounces (44 mL) of liquor.  Avoid use of  street drugs. Do not share needles with anyone. Ask for help if you need support or instructions about stopping the use of drugs.  High blood pressure causes heart disease and increases the risk of stroke. Your blood pressure should be checked at least every 1-2 years. Ongoing high blood pressure should be treated with medicines, if weight loss and exercise are not effective.  If you are 68-41 years old, ask your health care provider if you should take aspirin to prevent heart disease.  Diabetes screening is done by taking a blood sample to check your blood glucose level after you have not eaten for a certain period of time (fasting). If you are not overweight and you do not have risk factors for diabetes, you should be screened once every 3 years starting at age 48. If you are overweight or obese and you are 37-34 years of age, you should be screened for diabetes every year as part of your cardiovascular risk assessment.  Colorectal cancer can be detected and often prevented. Most routine colorectal cancer screening begins at the age of 9 and continues through age 44. However, your health care provider may recommend screening at an earlier age if you have risk factors for colon cancer. On a yearly basis, your health care provider may provide home test kits to check for hidden blood in the stool. Use of a small camera at the end of a tube to directly examine the colon (sigmoidoscopy or colonoscopy) can detect the earliest forms of colorectal cancer. Talk to your health care provider about this at age 16, when routine screening begins. Direct exam of the colon should be repeated every 5-10 years through age 68, unless early forms of precancerous polyps or small growths are found.  People who are at an increased risk for hepatitis B should be screened for this virus. You are considered at high risk for hepatitis B if:  You were born in a country where hepatitis B occurs often. Talk with your health care  provider about which countries are considered high risk.  Your parents were born in a high-risk country and you have not received a shot to protect against hepatitis B (hepatitis B vaccine).  You have HIV or AIDS.  You use needles to inject street drugs.  You live with, or have sex with, someone who has hepatitis B.  You are a man who has sex with other men (MSM).  You get hemodialysis treatment.  You take certain medicines for conditions such as cancer, organ transplantation, and autoimmune conditions.  Hepatitis C blood testing is recommended for all people born from [birth year] through 1965 and any individual with known risks for hepatitis C.  Practice safe sex. Use condoms and avoid high-risk sexual practices to reduce the spread of sexually transmitted infections (STIs). STIs include gonorrhea, chlamydia, syphilis, trichomonas, herpes, HPV, and human immunodeficiency virus (HIV). Herpes, HIV, and HPV are viral illnesses that have no cure. They can result in  disability, cancer, and death.  If you are a man who has sex with other men, you should be screened at least once per year for:  HIV.  Urethral, rectal, and pharyngeal infection of gonorrhea, chlamydia, or both.  If you are at risk of being infected with HIV, it is recommended that you take a prescription medicine daily to prevent HIV infection. This is called preexposure prophylaxis (PrEP). You are considered at risk if:  You are a man who has sex with other men (MSM) and have other risk factors.  You are a heterosexual man, are sexually active, and are at increased risk for HIV infection.  You take drugs by injection.  You are sexually active with a partner who has HIV.  Talk with your health care provider about whether you are at high risk of being infected with HIV. If you choose to begin PrEP, you should first be tested for HIV. You should then be tested every 3 months for as long as you are taking PrEP.  A one-time  screening for abdominal aortic aneurysm (AAA) and surgical repair of large AAAs by ultrasound are recommended for men ages 75 to 13 years who are current or former smokers.  Healthy men should no longer receive prostate-specific antigen (PSA) blood tests as part of routine cancer screening. Talk with your health care provider about prostate cancer screening.  Testicular cancer screening is not recommended for adult males who have no symptoms. Screening includes self-exam, a health care provider exam, and other screening tests. Consult with your health care provider about any symptoms you have or any concerns you have about testicular cancer.  Use sunscreen. Apply sunscreen liberally and repeatedly throughout the day. You should seek shade when your shadow is shorter than you. Protect yourself by wearing long sleeves, pants, a wide-brimmed hat, and sunglasses year round, whenever you are outdoors.  Once a month, do a whole-body skin exam, using a mirror to look at the skin on your back. Tell your health care provider about new moles, moles that have irregular borders, moles that are larger than a pencil eraser, or moles that have changed in shape or color.  Stay current with required vaccines (immunizations).  Influenza vaccine. All adults should be immunized every year.  Tetanus, diphtheria, and acellular pertussis (Td, Tdap) vaccine. An adult who has not previously received Tdap or who does not know his vaccine status should receive 1 dose of Tdap. This initial dose should be followed by tetanus and diphtheria toxoids (Td) booster doses every 10 years. Adults with an unknown or incomplete history of completing a 3-dose immunization series with Td-containing vaccines should begin or complete a primary immunization series including a Tdap dose. Adults should receive a Td booster every 10 years.  Varicella vaccine. An adult without evidence of immunity to varicella should receive 2 doses or a second  dose if he has previously received 1 dose.  Human papillomavirus (HPV) vaccine. Males aged 11-21 years who have not received the vaccine previously should receive the 3-dose series. Males aged 22-26 years may be immunized. Immunization is recommended through the age of 45 years for any male who has sex with males and did not get any or all doses earlier. Immunization is recommended for any person with an immunocompromised condition through the age of 33 years if he did not get any or all doses earlier. During the 3-dose series, the second dose should be obtained 4-8 weeks after the first dose. The third dose should  be obtained 24 weeks after the first dose and 16 weeks after the second dose.  Zoster vaccine. One dose is recommended for adults aged 13 years or older unless certain conditions are present.  Measles, mumps, and rubella (MMR) vaccine. Adults born before 65 generally are considered immune to measles and mumps. Adults born in [birth year] or later should have 1 or more doses of MMR vaccine unless there is a contraindication to the vaccine or there is laboratory evidence of immunity to each of the three diseases. A routine second dose of MMR vaccine should be obtained at least 28 days after the first dose for students attending postsecondary schools, health care workers, or international travelers. People who received inactivated measles vaccine or an unknown type of measles vaccine during 1963-1967 should receive 2 doses of MMR vaccine. People who received inactivated mumps vaccine or an unknown type of mumps vaccine before 1979 and are at high risk for mumps infection should consider immunization with 2 doses of MMR vaccine. Unvaccinated health care workers born before [birth year] who lack laboratory evidence of measles, mumps, or rubella immunity or laboratory confirmation of disease should consider measles and mumps immunization with 2 doses of MMR vaccine or rubella immunization with 1 dose of MMR  vaccine.  Pneumococcal 13-valent conjugate (PCV13) vaccine. When indicated, a person who is uncertain of his immunization history and has no record of immunization should receive the PCV13 vaccine. All adults 3 years of age and older should receive this vaccine. An adult aged 58 years or older who has certain medical conditions and has not been previously immunized should receive 1 dose of PCV13 vaccine. This PCV13 should be followed with a dose of pneumococcal polysaccharide (PPSV23) vaccine. Adults who are at high risk for pneumococcal disease should obtain the PPSV23 vaccine at least 8 weeks after the dose of PCV13 vaccine. Adults older than 56 years of age who have normal immune system function should obtain the PPSV23 vaccine dose at least 1 year after the dose of PCV13 vaccine.  Pneumococcal polysaccharide (PPSV23) vaccine. When PCV13 is also indicated, PCV13 should be obtained first. All adults aged 27 years and older should be immunized. An adult younger than age 51 years who has certain medical conditions should be immunized. Any person who resides in a nursing home or long-term care facility should be immunized. An adult smoker should be immunized. People with an immunocompromised condition and certain other conditions should receive both PCV13 and PPSV23 vaccines. People with human immunodeficiency virus (HIV) infection should be immunized as soon as possible after diagnosis. Immunization during chemotherapy or radiation therapy should be avoided. Routine use of PPSV23 vaccine is not recommended for American Indians, [place] Natives, or people younger than 65 years unless there are medical conditions that require PPSV23 vaccine. When indicated, people who have unknown immunization and have no record of immunization should receive PPSV23 vaccine. One-time revaccination 5 years after the first dose of PPSV23 is recommended for people aged 19-64 years who have chronic kidney failure, nephrotic syndrome,  asplenia, or immunocompromised conditions. People who received 1-2 doses of PPSV23 before age 25 years should receive another dose of PPSV23 vaccine at age 57 years or later if at least 5 years have passed since the previous dose. Doses of PPSV23 are not needed for people immunized with PPSV23 at or after age 5 years.  Meningococcal vaccine. Adults with asplenia or persistent complement component deficiencies should receive 2 doses of quadrivalent meningococcal conjugate (MenACWY-D) vaccine. The doses should be  obtained at least 2 months apart. Microbiologists working with certain meningococcal bacteria, [place] recruits, people at risk during an outbreak, and people who travel to or live in countries with a high rate of meningitis should be immunized. A first-year college student up through age 58 years who is living in a residence hall should receive a dose if he did not receive a dose on or after his 16th birthday. Adults who have certain high-risk conditions should receive one or more doses of vaccine.  Hepatitis A vaccine. Adults who wish to be protected from this disease, have chronic liver disease, work with hepatitis A-infected animals, work in hepatitis A research labs, or travel to or work in countries with a high rate of hepatitis A should be immunized. Adults who were previously unvaccinated and who anticipate close contact with an international adoptee during the first 60 days after arrival in the [place] from a country with a high rate of hepatitis A should be immunized.  Hepatitis B vaccine. Adults should be immunized if they wish to be protected from this disease, are under age 6 years and have diabetes, have chronic liver disease, have had more than one sex partner in the past 6 months, may be exposed to blood or other infectious body fluids, are household contacts or sex partners of hepatitis B positive people, are clients or workers in certain care facilities, or travel to or work  in countries with a high rate of hepatitis B.  Haemophilus influenzae type b (Hib) vaccine. A previously unvaccinated person with asplenia or sickle cell disease or having a scheduled splenectomy should receive 1 dose of Hib vaccine. Regardless of previous immunization, a recipient of a hematopoietic stem cell transplant should receive a 3-dose series 6-12 months after his successful transplant. Hib vaccine is not recommended for adults with HIV infection. Preventive Service / Frequency Ages 76 to 59  Blood pressure check.** / Every 3-5 years.  Lipid and cholesterol check.** / Every 5 years beginning at age 40.  Hepatitis C blood test.** / For any individual with known risks for hepatitis C.  Skin self-exam. / Monthly.  Influenza vaccine. / Every year.  Tetanus, diphtheria, and acellular pertussis (Tdap, Td) vaccine.** / Consult your health care provider. 1 dose of Td every 10 years.  Varicella vaccine.** / Consult your health care provider.  HPV vaccine. / 3 doses over 6 months, if 51 or younger.  Measles, mumps, rubella (MMR) vaccine.** / You need at least 1 dose of MMR if you were born in 1957 or later. You may also need a second dose.  Pneumococcal 13-valent conjugate (PCV13) vaccine.** / Consult your health care provider.  Pneumococcal polysaccharide (PPSV23) vaccine.** / 1 to 2 doses if you smoke cigarettes or if you have certain conditions.  Meningococcal vaccine.** / 1 dose if you are age 60 to 78 years and a Market researcher living in a residence hall, or have one of several medical conditions. You may also need additional booster doses.  Hepatitis A vaccine.** / Consult your health care provider.  Hepatitis B vaccine.** / Consult your health care provider.  Haemophilus influenzae type b (Hib) vaccine.** / Consult your health care provider. Ages 80 to 22  Blood pressure check.** / Every year.  Lipid and cholesterol check.** / Every 5 years beginning at age  31.  Lung cancer screening. / Every year if you are aged 55-80 years and have a 30-pack-year history of smoking and currently smoke or have quit within the  past 15 years. Yearly screening is stopped once you have quit smoking for at least 15 years or develop a health problem that would prevent you from having lung cancer treatment.  Fecal occult blood test (FOBT) of stool. / Every year beginning at age 51 and continuing until age 58. You may not have to do this test if you get a colonoscopy every 10 years.  Flexible sigmoidoscopy** or colonoscopy.** / Every 5 years for a flexible sigmoidoscopy or every 10 years for a colonoscopy beginning at age 39 and continuing until age 15.  Hepatitis C blood test.** / For all people born from [birth year] through 1965 and any individual with known risks for hepatitis C.  Skin self-exam. / Monthly.  Influenza vaccine. / Every year.  Tetanus, diphtheria, and acellular pertussis (Tdap/Td) vaccine.** / Consult your health care provider. 1 dose of Td every 10 years.  Varicella vaccine.** / Consult your health care provider.  Zoster vaccine.** / 1 dose for adults aged 71 years or older.  Measles, mumps, rubella (MMR) vaccine.** / You need at least 1 dose of MMR if you were born in 1957 or later. You may also need a second dose.  Pneumococcal 13-valent conjugate (PCV13) vaccine.** / Consult your health care provider.  Pneumococcal polysaccharide (PPSV23) vaccine.** / 1 to 2 doses if you smoke cigarettes or if you have certain conditions.  Meningococcal vaccine.** / Consult your health care provider.  Hepatitis A vaccine.** / Consult your health care provider.  Hepatitis B vaccine.** / Consult your health care provider.  Haemophilus influenzae type b (Hib) vaccine.** / Consult your health care provider. Ages 76 and over  Blood pressure check.** / Every year.  Lipid and cholesterol check.**/ Every 5 years beginning at age 78.  Lung cancer screening. / Every  year if you are aged 23-80 years and have a 30-pack-year history of smoking and currently smoke or have quit within the past 15 years. Yearly screening is stopped once you have quit smoking for at least 15 years or develop a health problem that would prevent you from having lung cancer treatment.  Fecal occult blood test (FOBT) of stool. / Every year beginning at age 70 and continuing until age 32. You may not have to do this test if you get a colonoscopy every 10 years.  Flexible sigmoidoscopy** or colonoscopy.** / Every 5 years for a flexible sigmoidoscopy or every 10 years for a colonoscopy beginning at age 60 and continuing until age 14.  Hepatitis C blood test.** / For all people born from [birth year] through 1966 and any individual with known risks for hepatitis C.  Abdominal aortic aneurysm (AAA) screening.** / A one-time screening for ages 3 to 65 years who are current or former smokers.  Skin self-exam. / Monthly.  Influenza vaccine. / Every year.  Tetanus, diphtheria, and acellular pertussis (Tdap/Td) vaccine.** / 1 dose of Td every 10 years.  Varicella vaccine.** / Consult your health care provider.  Zoster vaccine.** / 1 dose for adults aged 81 years or older.  Pneumococcal 13-valent conjugate (PCV13) vaccine.** / 1 dose for all adults aged 50 years and older.  Pneumococcal polysaccharide (PPSV23) vaccine.** / 1 dose for all adults aged 107 years and older.  Meningococcal vaccine.** / Consult your health care provider.  Hepatitis A vaccine.** / Consult your health care provider.  Hepatitis B vaccine.** / Consult your health care provider.  Haemophilus influenzae type b (Hib) vaccine.** / Consult your health care provider. **Family history and personal history of  risk and conditions may change your health care provider's recommendations.   This information is not intended to replace advice given to you by your health care provider. Make sure you discuss any questions you have  with your health care provider.   Document Released: 09/17/2001 Document Revised: 08/12/2014 Document Reviewed: 12/17/2010 [employer]vier Interactive Patient Education [employer].

## 2015-08-29 NOTE — Progress Notes (Signed)
Subjective:    Patient ID: Derek Conner, male    DOB: 1961/06/19, 55 y.o.   MRN: [id number]  HPI   I have reviewed pt PMH, PSH, FH, Social History and Surgical History.  Pt is not exercising, But he walks about 2-3 miles at work. 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 Pt drinking 2 cups of coffee a day. 3-4 soft drinks a week. Pt is still a smoker. Pt has 21 mg nicotin patch. He is smoking less than a pack a day. Was smoking about a pack since last visit. Pt drinks 3-4 beers night.   Pt bp have been 100/70, 138/76, 148/94, 152/94, and 138/92. These reading are at home in the evening. Sometimes after smoking.  Pt has had recent weight gain. His weight varies up and down per his report.  Pt is due for tdap. Last one in 2007. Pt does not get flu vaccine. Declines. Up to date on colonoscopy.      Review of Systems  Constitutional: Negative for fever, chills, diaphoresis, activity change and fatigue.  Respiratory: Negative for cough, chest tightness and shortness of breath.   Cardiovascular: Negative for chest pain, palpitations and leg swelling.  Gastrointestinal: Negative for nausea, vomiting and abdominal pain.  Musculoskeletal: Negative for neck pain and neck stiffness.  Neurological: Negative for dizziness, tremors, seizures, syncope, facial asymmetry, speech difficulty, weakness, light-headedness, numbness and headaches.  Psychiatric/Behavioral: Negative for behavioral problems, confusion and agitation. The patient is not nervous/anxious.     Past Medical History  Diagnosis Date  . COPD (chronic obstructive pulmonary disease) (HCC)   . Emphysema lung (HCC)   . Hypertension   . Asthma   . Rheumatoid arthritis (HCC)     "qwhere" (12/04/2013)  . Depression   . Emphysema of lung [facility])     Social History   Social History  . Marital Status: Single    Spouse Name: N/A  . Number of Children: N/A  . Years of Education: N/A   Occupational History  . Not on  file.   Social History Main Topics  . Smoking status: Current Every Day Smoker -- 0.50 packs/day for 37 years    Types: Cigarettes  . Smokeless tobacco: Never Used     Comment: down to 6 cigs/day 3.31.16  . Alcohol Use: 3.6 oz/week    6 Cans of beer per week     Comment: 12/04/2013 "6-8 beers/day"  . Drug Use: Yes    Special: Marijuana     Comment: 12/04/2013 "smoke pot a couple times/wk"  . Sexual Activity: Yes   Other Topics Concern  . Not on file   Social History Narrative    Past Surgical History  Procedure Laterality Date  . Appendectomy  1980's  . Foot fracture surgery Left ~ 2005    "it was crushed"  . Cystectomy Left 1960's    "wrist"  . Chest tube insertion Left 1990's    "lung collapsed"  . Video bronchoscopy Bilateral 12/06/2013    Procedure: VIDEO BRONCHOSCOPY WITHOUT FLUORO;  Surgeon: Alyson Reedy, MD;  Location: [facility] ENDOSCOPY;  Service: Cardiopulmonary;  Laterality: Bilateral;    Family History  Problem Relation Age of Onset  . Cancer Mother   . Arthritis Mother   . Stroke Father   . Hypertension Father   . Colon cancer Neg Hx     Allergies  Allergen Reactions  . Symbicort [Budesonide-Formoterol Fumarate] Shortness Of Breath, Nausea And Vomiting and Other (See Comments)  heachache  . Dulera [Mometasone Furo-Formoterol Fum]     Headache    Current Outpatient Prescriptions on File Prior to Visit  Medication Sig Dispense Refill  . albuterol (PROVENTIL HFA;VENTOLIN HFA) 108 (90 BASE) MCG/ACT inhaler Inhale 2 puffs into the lungs every 4 (four) hours as needed for wheezing or shortness of breath.     Marland Kitchen albuterol (PROVENTIL HFA;VENTOLIN HFA) 108 (90 BASE) MCG/ACT inhaler Inhale 2 puffs into the lungs every 6 (six) hours as needed for wheezing or shortness of breath. 1 Inhaler 6  . atorvastatin (LIPITOR) 20 MG tablet Take 1 tablet (20 mg total) by mouth daily. 30 tablet 2  . b complex vitamins tablet Take 1 tablet by mouth daily.    Marland Kitchen ibuprofen  (ADVIL,MOTRIN) 200 MG tablet Take 200 mg by mouth every 6 (six) hours as needed.    . Multiple Vitamin (MULTIVITAMIN WITH MINERALS) TABS tablet Take 1 tablet by mouth daily. 30 tablet 0  . tiotropium (SPIRIVA HANDIHALER) 18 MCG inhalation capsule PLACE 1 CAPSULE INTO INHALER AND INHALE INTO LUNGS DAILY. 30 capsule 6   No current facility-administered medications on file prior to visit.    BP 130/78 mmHg  Pulse 71  Temp(Src) 97.6 F (36.4 C) (Oral)  Ht 6\' 1"  (1.854 m)  Wt 183 lb (83.008 kg)  BMI 24.15 kg/m2  SpO2 95%       Objective:   Physical Exam  General Mental Status- Alert. General Appearance- Not in acute distress.   Skin General: Color- Normal Color. Moisture- Normal Moisture. Close inspecton of his back did not reveal any worrisome lesion or moles.  Neck Carotid Arteries- Normal color. Moisture- Normal Moisture. No carotid bruits. No JVD.  Chest and Lung Exam Auscultation: Breath Sounds:-Normal.  Cardiovascular Auscultation:Rythm- Regular. Murmurs & Other Heart Sounds:Auscultation of the heart reveals- No Murmurs.  Abdomen Inspection:-Inspeection Normal. Palpation/Percussion:Note:No mass. Palpation and Percussion of the abdomen reveal- Non Tender, Non Distended + BS, no rebound or guarding.   Neurologic Cranial Nerve exam:- CN III-XII intact(No nystagmus), symmetric smile. Strength:- 5/5 equal and symmetric strength both upper and lower extremities.  Male Genitourinary Urethra:- No discharge. Penis- Circumcised. Scrotum- No masses. Testes- Bilateral-Normal.  Rectal Anorectal Exam: Performed- Normal sphincter tone. No masses noted. Prostate smooth normal size. Stool HEME Negative.       Assessment & Plan:

## 2015-08-30 LAB — HIV ANTIBODY (ROUTINE TESTING W REFLEX): HIV: NONREACTIVE

## 2015-08-30 NOTE — Telephone Encounter (Signed)
flu

## 2015-08-31 LAB — URINE CULTURE
Colony Count: NO GROWTH
Organism ID, Bacteria: NO GROWTH

## 2015-09-05 DIAGNOSIS — H5213 Myopia, bilateral: Secondary | ICD-10-CM | POA: Diagnosis not present

## 2015-09-05 DIAGNOSIS — H52223 Regular astigmatism, bilateral: Secondary | ICD-10-CM | POA: Diagnosis not present

## 2015-09-05 MED FILL — ATORVASTATIN 20 MG TABLET: 20 | 30 days supply | Qty: 30 | Fill #2

## 2015-09-05 MED FILL — VENTOLIN HFA 90 MCG INHALER: 108 (90 BAS | 30 days supply | Qty: 18 | Fill #1

## 2015-09-05 MED FILL — SPIRIVA 18 MCG CP-HANDIHALE: 18 | 30 days supply | Qty: 30 | Fill #2

## 2015-09-05 MED FILL — ADVAIR 500/50 DISKUS: 500-50 | 30 days supply | Qty: 60 | Fill #1

## 2015-09-11 ENCOUNTER — Other Ambulatory Visit: Payer: Self-pay | Admitting: Acute Care

## 2015-09-11 DIAGNOSIS — F17210 Nicotine dependence, cigarettes, uncomplicated: Principal | ICD-10-CM

## 2015-09-15 ENCOUNTER — Ambulatory Visit (INDEPENDENT_AMBULATORY_CARE_PROVIDER_SITE_OTHER)
Admission: RE | Admit: 2015-09-15 | Discharge: 2015-09-15 | Disposition: A | Discharge status: Home / Self Care | Payer: 59 | Source: Ambulatory Visit | Attending: Acute Care | Admitting: Acute Care

## 2015-09-15 DIAGNOSIS — F17210 Nicotine dependence, cigarettes, uncomplicated: Secondary | ICD-10-CM | POA: Diagnosis not present

## 2015-09-18 ENCOUNTER — Telehealth: Payer: Self-pay | Admitting: Medical

## 2015-09-18 NOTE — Telephone Encounter (Signed)
Will you call pt and let him know that I did review his CT of chest report. Kandice Robinsons NP made comments on his ct of chest that they would repeat the order/test in 6 months. Who is this provider. Does she work with radiologist. Trying to figure out if she will order. I just don't want to let this fall through the cracks.

## 2015-09-19 NOTE — Telephone Encounter (Signed)
Spoke with pt and he states that he NP called him yesterday and will be following his care for the repeat scan. Sarah the NP told him that one of our ladies up front would set up an appointment in August for him also. FYI

## 2015-09-25 ENCOUNTER — Telehealth: Payer: Self-pay | Admitting: Medical

## 2015-09-25 ENCOUNTER — Ambulatory Visit (INDEPENDENT_AMBULATORY_CARE_PROVIDER_SITE_OTHER): Payer: 59 | Admitting: Medical

## 2015-09-25 ENCOUNTER — Other Ambulatory Visit: Payer: Self-pay | Admitting: Medical

## 2015-09-25 ENCOUNTER — Ambulatory Visit (HOSPITAL_BASED_OUTPATIENT_CLINIC_OR_DEPARTMENT_OTHER)
Admission: RE | Admit: 2015-09-25 | Discharge: 2015-09-25 | Disposition: A | Discharge status: Home / Self Care | Payer: 59 | Source: Ambulatory Visit | Attending: Medical | Admitting: Medical

## 2015-09-25 ENCOUNTER — Encounter: Payer: Self-pay | Admitting: Medical

## 2015-09-25 VITALS — BP 120/78 | HR 70 | Temp 98.4°F | Ht 73.0 in | Wt 186.0 lb

## 2015-09-25 DIAGNOSIS — M25542 Pain in joints of left hand: Secondary | ICD-10-CM | POA: Insufficient documentation

## 2015-09-25 DIAGNOSIS — M2550 Pain in unspecified joint: Secondary | ICD-10-CM | POA: Diagnosis not present

## 2015-09-25 DIAGNOSIS — M79645 Pain in left finger(s): Secondary | ICD-10-CM | POA: Diagnosis not present

## 2015-09-25 MED ORDER — DOXYCYCLINE HYCLATE 100 MG PO TABS: 100.0000 mg | ORAL_TABLET | Freq: Two times a day (BID) | ORAL | Status: DC

## 2015-09-25 MED ORDER — PREDNISONE 10 MG PO TABS: ORAL_TABLET | ORAL | Status: DC

## 2015-09-25 MED FILL — predniSONE 10 MG TABS: 10 | 5 days supply | Qty: 15 | Fill #0

## 2015-09-25 MED FILL — DOXYCYCLINE 100 MG TABLET: 100 | 10 days supply | Qty: 20 | Fill #0

## 2015-09-25 NOTE — Patient Instructions (Addendum)
Will get xray of your left hand today RA panel, uric acid, and cbc.  Will refer you back to rheumatologist.   Will prescribe taper dose prednisone.  Reviewed signs and symptoms of infection since this is concern of pt wife. Exam favors inflammatory condition. But if infection signs or symptoms occur please notify us. If such occurs then start doxycycline  Follow up 4 days or as needed

## 2015-09-25 NOTE — Progress Notes (Signed)
Subjective:    Patient ID: Derek Conner, male    DOB: [birth year], 55 y.o.   MRN: [id number]  HPI  Pt in for evaluation of his left thumb and hand pain. Pt has hx of RA. Pt states history of being on humera briefly about 5 years ago. Pt was on med years ago for about 1.5 years. He stopped humera since he was feeling very tired.   Pt has not had any hand injury. Pt had no puncture wounds, no cuts, no fevers, no chills.   Pt states pain 2 wks ago started to get achiness in his left thumb. But yesterday his hand started hurting.   Thumb hurts on movement.  Pt is left handed.   Review of Systems  Constitutional: Negative for chills and fatigue.  Respiratory: Negative for cough, choking, chest tightness and wheezing.   Cardiovascular: Negative for chest pain and palpitations.  Musculoskeletal:       Lt thumb/hand pain.  Neurological: Negative for dizziness and headaches.  Hematological: Negative for adenopathy. Does not bruise/bleed easily.  Psychiatric/Behavioral: Negative for behavioral problems and confusion.    Past Medical History  Diagnosis Date  . COPD (chronic obstructive pulmonary disease) (HCC)   . Emphysema lung (HCC)   . Hypertension   . Asthma   . Rheumatoid arthritis (HCC)     "qwhere" (12/04/2013)  . Depression   . Emphysema of lung [facility])     Social History   Social History  . Marital Status: Single    Spouse Name: N/A  . Number of Children: N/A  . Years of Education: N/A   Occupational History  . Not on file.   Social History Main Topics  . Smoking status: Current Every Day Smoker -- 0.50 packs/day for 37 years    Types: Cigarettes  . Smokeless tobacco: Never Used     Comment: down to 6 cigs/day 3.31.16  . Alcohol Use: 3.6 oz/week    6 Cans of beer per week     Comment: 12/04/2013 "6-8 beers/day"  . Drug Use: Yes    Special: Marijuana     Comment: 12/04/2013 "smoke pot a couple times/wk"  . Sexual Activity: Yes   Other Topics Concern  . Not  on file   Social History Narrative    Past Surgical History  Procedure Laterality Date  . Appendectomy  1980's  . Foot fracture surgery Left ~ 2005    "it was crushed"  . Cystectomy Left 1960's    "wrist"  . Chest tube insertion Left 1990's    "lung collapsed"  . Video bronchoscopy Bilateral 12/06/2013    Procedure: VIDEO BRONCHOSCOPY WITHOUT FLUORO;  Surgeon: Alyson Reedy, MD;  Location: [facility] ENDOSCOPY;  Service: Cardiopulmonary;  Laterality: Bilateral;    Family History  Problem Relation Age of Onset  . Cancer Mother   . Arthritis Mother   . Stroke Father   . Hypertension Father   . Colon cancer Neg Hx     Allergies  Allergen Reactions  . Symbicort [Budesonide-Formoterol Fumarate] Shortness Of Breath, Nausea And Vomiting and Other (See Comments)    heachache  . Dulera [Mometasone Furo-Formoterol Fum]     Headache    Current Outpatient Prescriptions on File Prior to Visit  Medication Sig Dispense Refill  . albuterol (PROVENTIL HFA;VENTOLIN HFA) 108 (90 BASE) MCG/ACT inhaler Inhale 2 puffs into the lungs every 4 (four) hours as needed for wheezing or shortness of breath.     Marland Kitchen albuterol (PROVENTIL HFA;VENTOLIN HFA)  108 (90 BASE) MCG/ACT inhaler Inhale 2 puffs into the lungs every 6 (six) hours as needed for wheezing or shortness of breath. 1 Inhaler 6  . amLODipine (NORVASC) 10 MG tablet TAKE 1 TABLET (10 MG) BY MOUTH DAILY. 90 tablet 1  . atorvastatin (LIPITOR) 20 MG tablet Take 1 tablet (20 mg total) by mouth daily. 30 tablet 2  . b complex vitamins tablet Take 1 tablet by mouth daily.    . cloNIDine (CATAPRES) 0.1 MG tablet Take 1 tablet (0.1 mg total) by mouth 2 (two) times daily. 180 tablet 0  . Fluticasone-Salmeterol (ADVAIR DISKUS) 500-50 MCG/DOSE AEPB INHALE 1 PUFF BY MOUTH INTO THE LUNGS 2 TIMES DAILY. 2 each 0  . ibuprofen (ADVIL,MOTRIN) 200 MG tablet Take 200 mg by mouth every 6 (six) hours as needed.    Marland Kitchen lisinopril (PRINIVIL,ZESTRIL) 10 MG tablet TAKE 1 TABLET  (10 MG) BY MOUTH DAILY. 90 tablet 1  . Multiple Vitamin (MULTIVITAMIN WITH MINERALS) TABS tablet Take 1 tablet by mouth daily. 30 tablet 0  . nicotine (NICODERM CQ) 14 mg/24hr patch Place 1 patch (14 mg total) onto the skin daily. 28 patch 2  . tiotropium (SPIRIVA HANDIHALER) 18 MCG inhalation capsule PLACE 1 CAPSULE INTO INHALER AND INHALE INTO LUNGS DAILY. 30 capsule 6   No current facility-administered medications on file prior to visit.    BP 120/78 mmHg  Pulse 70  Temp(Src) 98.4 F (36.9 C) (Oral)  Ht 6\' 1"  (1.854 m)  Wt 186 lb (84.369 kg)  BMI 24.55 kg/m2  SpO2 97%       Objective:   Physical Exam  General Mental Status- Alert. General Appearance- Not in acute distress.   Skin General: Color- Normal Color. Moisture- Normal Moisture.  Neck Carotid Arteries- Normal color. Moisture- Normal Moisture. No carotid bruits. No JVD.  Chest and Lung Exam Auscultation: Breath Sounds:-Normal.  Cardiovascular Auscultation:Rythm- Regular. Murmurs & Other Heart Sounds:Auscultation of the heart reveals- No Murmurs.   Neurologic Cranial Nerve exam:- CN III-XII intact(No nystagmus), symmetric smile. Strength:- 5/5 equal and symmetric strength both upper and lower extremities.  Lt hand- swollened joint base of thumb. Mild- moderate swelling of the thumb . Decreased flexion of joint.  Palmar aspect of the hand/mcp joint mild tender.     Assessment & Plan:  Will get xray of your left hand today RA panel, uric acid, and cbc.  Will refer you back to rheumatologist.   Will prescribe taper dose prednisone.  Reviewed signs and symptoms of infection since this is concern of pt wife. Exam favors inflammatory condition. But if infection signs or symptoms occur please notify . If such occurs then start doxycycline  Follow up 4 days or as needed

## 2015-09-25 NOTE — Telephone Encounter (Signed)
Pt states Dr Herma Carson was his rheumatologist. Do you who that doctor is?

## 2015-09-25 NOTE — Progress Notes (Signed)
Pre visit review using our clinic review tool, if applicable. No additional management support is needed unless otherwise documented below in the visit note. 

## 2015-09-26 ENCOUNTER — Encounter: Payer: Self-pay | Admitting: Medical

## 2015-09-26 LAB — SEDIMENTATION RATE: Sed Rate: 12 mm/hr (ref 0–22)

## 2015-09-26 LAB — CBC WITH DIFFERENTIAL/PLATELET
BASOS ABS: 0.1 10*3/uL (ref 0.0–0.1)
Basophils Relative: 0.5 % (ref 0.0–3.0)
Eosinophils Absolute: 0.4 10*3/uL (ref 0.0–0.7)
Eosinophils Relative: 4.2 % (ref 0.0–5.0)
HEMATOCRIT: 40.3 % (ref 39.0–52.0)
Hemoglobin: 13.6 g/dL (ref 13.0–17.0)
LYMPHS PCT: 32.6 % (ref 12.0–46.0)
Lymphs Abs: 3.3 10*3/uL (ref 0.7–4.0)
MCHC: 33.7 g/dL (ref 30.0–36.0)
MCV: 86.8 fl (ref 78.0–100.0)
MONOS PCT: 8.5 % (ref 3.0–12.0)
Monocytes Absolute: 0.9 10*3/uL (ref 0.1–1.0)
Neutro Abs: 5.5 10*3/uL (ref 1.4–7.7)
Neutrophils Relative %: 54.2 % (ref 43.0–77.0)
Platelets: 314 10*3/uL (ref 150.0–400.0)
RBC: 4.65 Mil/uL (ref 4.22–5.81)
RDW: 14.1 % (ref 11.5–15.5)
WBC: 10.1 10*3/uL (ref 4.0–10.5)

## 2015-09-26 LAB — ANA: ANA: NEGATIVE

## 2015-09-26 LAB — URIC ACID: URIC ACID, SERUM: 6.2 mg/dL (ref 4.0–7.8)

## 2015-09-26 LAB — RHEUMATOID FACTOR: RHEUMATOID FACTOR: 157 [IU]/mL — AB (ref <=14)

## 2015-09-26 LAB — C-REACTIVE PROTEIN: CRP: 0.5 mg/dL (ref 0.5–20.0)

## 2015-09-26 NOTE — Telephone Encounter (Signed)
Dr. Francee Gentile

## 2015-09-28 MED FILL — ADVAIR 500/50 DISKUS: 500-50 | 30 days supply | Qty: 60 | Fill #2

## 2015-09-28 MED FILL — VENTOLIN HFA 90 MCG INHALER: 108 (90 BAS | 30 days supply | Qty: 18 | Fill #2

## 2015-09-28 MED FILL — SPIRIVA 18 MCG CP-HANDIHALE: 18 | 30 days supply | Qty: 30 | Fill #3

## 2015-09-30 ENCOUNTER — Other Ambulatory Visit: Payer: Self-pay | Admitting: Acute Care

## 2015-09-30 DIAGNOSIS — F17210 Nicotine dependence, cigarettes, uncomplicated: Principal | ICD-10-CM

## 2015-10-17 MED FILL — LISINOPRIL 10 MG TABLET: 10 | 90 days supply | Qty: 90 | Fill #1

## 2015-10-17 MED FILL — AMLODIPINE BESYLATE 10 MG T: 10 | 90 days supply | Qty: 90 | Fill #1

## 2015-10-17 MED FILL — ATORVASTATIN 20 MG TABLET: 20 | 30 days supply | Qty: 30 | Fill #3

## 2015-10-24 ENCOUNTER — Other Ambulatory Visit: Payer: Self-pay | Admitting: Acute Care

## 2015-10-24 DIAGNOSIS — F17210 Nicotine dependence, cigarettes, uncomplicated: Principal | ICD-10-CM

## 2015-11-01 MED FILL — SPIRIVA 18 MCG CP-HANDIHALE: 18 | 30 days supply | Qty: 30 | Fill #4

## 2015-11-01 MED FILL — VENTOLIN HFA 90 MCG INHALER: 108 (90 BAS | 30 days supply | Qty: 18 | Fill #3

## 2015-11-01 MED FILL — ADVAIR 500/50 DISKUS: 500-50 | 30 days supply | Qty: 60 | Fill #3

## 2015-11-17 MED FILL — ATORVASTATIN 20 MG TABLET: 20 | 30 days supply | Qty: 30 | Fill #0

## 2015-11-21 DIAGNOSIS — M79645 Pain in left finger(s): Secondary | ICD-10-CM | POA: Diagnosis not present

## 2015-11-21 DIAGNOSIS — R768 Other specified abnormal immunological findings in serum: Secondary | ICD-10-CM | POA: Diagnosis not present

## 2015-11-27 ENCOUNTER — Other Ambulatory Visit: Payer: Self-pay | Admitting: Medical

## 2015-11-27 ENCOUNTER — Encounter: Payer: Self-pay | Admitting: Medical

## 2015-11-27 MED FILL — cloNIDine HCL 0.1 MG TABS: 0.1 | 90 days supply | Qty: 180 | Fill #0

## 2015-11-29 MED FILL — SPIRIVA 18 MCG CP-HANDIHALE: 18 | 30 days supply | Qty: 30 | Fill #5

## 2015-11-29 MED FILL — VENTOLIN HFA 90 MCG INHALER: 108 (90 BAS | 30 days supply | Qty: 18 | Fill #4

## 2015-11-29 MED FILL — ADVAIR 500/50 DISKUS: 500-50 | 30 days supply | Qty: 60 | Fill #4

## 2015-12-11 MED ORDER — CLONIDINE HCL 0.1 MG PO TABS: ORAL_TABLET | ORAL | Status: DC

## 2015-12-11 NOTE — Telephone Encounter (Signed)
Message sent to pt to notify of refills.

## 2015-12-11 NOTE — Telephone Encounter (Signed)
Notify pt that I refilled his clonidine.

## 2015-12-11 NOTE — Telephone Encounter (Signed)
Please advise on the refill of the Clonidine.  Pt was last seen 09/25/15.

## 2015-12-12 NOTE — Telephone Encounter (Signed)
Medication has been filled on 12/11/15. Pt has been sent a message and is aware.

## 2015-12-18 MED FILL — ATORVASTATIN 20 MG TABLET: 20 | 30 days supply | Qty: 30 | Fill #1

## 2016-01-02 MED FILL — AMLODIPINE BESYLATE 10 MG T: 10 | 90 days supply | Qty: 90 | Fill #1

## 2016-01-02 MED FILL — VENTOLIN HFA 90 MCG INHALER: 108 (90 BAS | 30 days supply | Qty: 18 | Fill #5

## 2016-01-02 MED FILL — ADVAIR 500/50 DISKUS: 500-50 | 30 days supply | Qty: 60 | Fill #5

## 2016-01-02 MED FILL — SPIRIVA 18 MCG CP-HANDIHALE: 18 | 30 days supply | Qty: 30 | Fill #6

## 2016-01-02 MED FILL — LISINOPRIL 10 MG TABLET: 10 | 90 days supply | Qty: 90 | Fill #1

## 2016-01-17 ENCOUNTER — Emergency Department (HOSPITAL_BASED_OUTPATIENT_CLINIC_OR_DEPARTMENT_OTHER): Payer: 59

## 2016-01-17 ENCOUNTER — Encounter (HOSPITAL_BASED_OUTPATIENT_CLINIC_OR_DEPARTMENT_OTHER): Payer: Self-pay | Admitting: *Deleted

## 2016-01-17 ENCOUNTER — Emergency Department (HOSPITAL_BASED_OUTPATIENT_CLINIC_OR_DEPARTMENT_OTHER)
Admission: EM | Admit: 2016-01-17 | Discharge: 2016-01-17 | Disposition: A | Discharge status: Home / Self Care | Payer: 59 | Source: Home / Self Care | Attending: Emergency Medicine | Admitting: Emergency Medicine

## 2016-01-17 DIAGNOSIS — E785 Hyperlipidemia, unspecified: Secondary | ICD-10-CM | POA: Diagnosis not present

## 2016-01-17 DIAGNOSIS — M069 Rheumatoid arthritis, unspecified: Secondary | ICD-10-CM | POA: Insufficient documentation

## 2016-01-17 DIAGNOSIS — I10 Essential (primary) hypertension: Secondary | ICD-10-CM

## 2016-01-17 DIAGNOSIS — Z79899 Other long term (current) drug therapy: Secondary | ICD-10-CM | POA: Insufficient documentation

## 2016-01-17 DIAGNOSIS — J45909 Unspecified asthma, uncomplicated: Secondary | ICD-10-CM

## 2016-01-17 DIAGNOSIS — J441 Chronic obstructive pulmonary disease with (acute) exacerbation: Secondary | ICD-10-CM | POA: Diagnosis not present

## 2016-01-17 DIAGNOSIS — J440 Chronic obstructive pulmonary disease with acute lower respiratory infection: Secondary | ICD-10-CM | POA: Diagnosis not present

## 2016-01-17 DIAGNOSIS — J189 Pneumonia, unspecified organism: Secondary | ICD-10-CM | POA: Insufficient documentation

## 2016-01-17 DIAGNOSIS — F329 Major depressive disorder, single episode, unspecified: Secondary | ICD-10-CM | POA: Insufficient documentation

## 2016-01-17 DIAGNOSIS — R55 Syncope and collapse: Secondary | ICD-10-CM | POA: Diagnosis not present

## 2016-01-17 DIAGNOSIS — R0602 Shortness of breath: Secondary | ICD-10-CM | POA: Diagnosis not present

## 2016-01-17 DIAGNOSIS — F17210 Nicotine dependence, cigarettes, uncomplicated: Secondary | ICD-10-CM | POA: Insufficient documentation

## 2016-01-17 DIAGNOSIS — R05 Cough: Secondary | ICD-10-CM | POA: Diagnosis not present

## 2016-01-17 DIAGNOSIS — J449 Chronic obstructive pulmonary disease, unspecified: Secondary | ICD-10-CM

## 2016-01-17 DIAGNOSIS — I671 Cerebral aneurysm, nonruptured: Secondary | ICD-10-CM | POA: Diagnosis not present

## 2016-01-17 MED ORDER — IPRATROPIUM BROMIDE 0.02 % IN SOLN
0.5000 mg | Freq: Once | RESPIRATORY_TRACT | Status: AC
  Administered 2016-01-17: 0.5 mg via RESPIRATORY_TRACT
  Filled 2016-01-17: qty 2.5

## 2016-01-17 MED ORDER — PREDNISONE 50 MG PO TABS
60.0000 mg | ORAL_TABLET | Freq: Once | ORAL | Status: AC
  Administered 2016-01-17: 60 mg via ORAL
  Filled 2016-01-17: qty 1

## 2016-01-17 MED ORDER — ALBUTEROL SULFATE (2.5 MG/3ML) 0.083% IN NEBU
5.0000 mg | INHALATION_SOLUTION | Freq: Once | RESPIRATORY_TRACT | Status: AC
  Administered 2016-01-17: 5 mg via RESPIRATORY_TRACT
  Filled 2016-01-17: qty 6

## 2016-01-17 MED ORDER — LEVOFLOXACIN 750 MG PO TABS: 750.0000 mg | ORAL_TABLET | Freq: Every day | ORAL | Status: DC

## 2016-01-17 MED ORDER — PREDNISONE 20 MG PO TABS: 40.0000 mg | ORAL_TABLET | Freq: Every day | ORAL | Status: DC

## 2016-01-17 MED ORDER — TIOTROPIUM BROMIDE MONOHYDRATE 18 MCG IN CAPS: ORAL_CAPSULE | RESPIRATORY_TRACT | Status: DC

## 2016-01-17 MED FILL — levoFLOXacin 750 MG TABS: 750 | 7 days supply | Qty: 7 | Fill #0

## 2016-01-17 MED FILL — predniSONE 20 MG TABS: 20 | 8 days supply | Qty: 16 | Fill #0

## 2016-01-17 NOTE — Discharge Instructions (Signed)
You were seen and evaluated today for your cough and shortness of breath. It appears that you have pneumonia on your chest x-ray. Please take the antibiotic prescribed and follow-up with her primary care doctor in the next few days for reevaluation. If your symptoms get worse or he started developing fever please return to emergency department for possible IV antibiotics. Take the steroids that were prescribed for your wheezing associated with her COPD. Rest and keep yourself well-hydrated.  Community-Acquired Pneumonia, Adult Pneumonia is an infection of the lungs. One type of pneumonia can happen while a person is in a hospital. A different type can happen when a person is not in a hospital (community-acquired pneumonia). It is easy for this kind to spread from person to person. It can spread to you if you breathe near an infected person who coughs or sneezes. Some symptoms include:  A dry cough.  A wet (productive) cough.  Fever.  Sweating.  Chest pain. HOME CARE  Take over-the-counter and prescription medicines only as told by your doctor.  Only take cough medicine if you are losing sleep.  If you were prescribed an antibiotic medicine, take it as told by your doctor. Do not stop taking the antibiotic even if you start to feel better.  Sleep with your head and neck raised (elevated). You can do this by putting a few pillows under your head, or you can sleep in a recliner.  Do not use tobacco products. These include cigarettes, chewing tobacco, and e-cigarettes. If you need help quitting, ask your doctor.  Drink enough water to keep your pee (urine) clear or pale yellow. A shot (vaccine) can help prevent pneumonia. Shots are often suggested for:  People older than 56 years of age.  People older than 56 years of age:  Who are having cancer treatment.  Who have long-term (chronic) lung disease.  Who have problems with their body's defense system (immune system). You may also  prevent pneumonia if you take these actions:  Get the flu (influenza) shot every year.  Go to the dentist as often as told.  Wash your hands often. If soap and water are not available, use hand sanitizer. GET HELP IF:  You have a fever.  You lose sleep because your cough medicine does not help. GET HELP RIGHT AWAY IF:  You are short of breath and it gets worse.  You have more chest pain.  Your sickness gets worse. This is very serious if:  You are an older adult.  Your body's defense system is weak.  You cough up blood.   This information is not intended to replace advice given to you by your health care provider. Make sure you discuss any questions you have with your health care provider.   Document Released: 01/08/2008 Document Revised: 04/12/2015 Document Reviewed: 11/16/2014 Elsevier Interactive Patient Education [employer]

## 2016-01-17 NOTE — ED Notes (Signed)
Patient transported to X-ray 

## 2016-01-17 NOTE — ED Notes (Signed)
Pt amb to room 7 with quick steady gait in nad. Pt reports cough and congestion x 3 days with sinus pressure and drainage. Pt reports hx of copd and emphysema.

## 2016-01-17 NOTE — ED Provider Notes (Signed)
CSN: [id number]     Arrival date & time 01/17/16  9509 History   First MD Initiated Contact with Patient 01/17/16 [phone]     Chief Complaint  Patient presents with  . Cough     (Consider location/radiation/quality/duration/timing/severity/associated sxs/prior Treatment) HPI Comments: 55 year old male with history of COPD, hypertension presents for shortness of breath, cough, headache. The patient reports that his sinuses have been congested for several days and that over the last 3 days he has had a cough with increased congestion. He reports drainage from his sinuses as well as pressure. He has been using his rescue inhaler multiple times a day over the last few days. His wife said he has had to sit up to try to catch his breath. He denies fever but says he has felt chilled.   Past Medical History  Diagnosis Date  . COPD (chronic obstructive pulmonary disease) (HCC)   . Emphysema lung (HCC)   . Hypertension   . Asthma   . Rheumatoid arthritis (HCC)     "qwhere" (12/04/2013)  . Depression   . Emphysema of lung [facility])    Past Surgical History  Procedure Laterality Date  . Appendectomy  1980's  . Foot fracture surgery Left ~ 2005    "it was crushed"  . Cystectomy Left 1960's    "wrist"  . Chest tube insertion Left 1990's    "lung collapsed"  . Video bronchoscopy Bilateral 12/06/2013    Procedure: VIDEO BRONCHOSCOPY WITHOUT FLUORO;  Surgeon: Alyson Reedy, MD;  Location: [facility] ENDOSCOPY;  Service: Cardiopulmonary;  Laterality: Bilateral;   Family History  Problem Relation Age of Onset  . Cancer Mother   . Arthritis Mother   . Stroke Father   . Hypertension Father   . Colon cancer Neg Hx    Social History  Substance Use Topics  . Smoking status: Current Every Day Smoker -- 0.50 packs/day for 37 years    Types: Cigarettes  . Smokeless tobacco: Never Used     Comment: down to 6 cigs/day 3.31.16  . Alcohol Use: 3.6 oz/week    6 Cans of beer per week     Comment: 12/04/2013 "6-8  beers/day"    Review of Systems  Constitutional: Positive for chills and fatigue. Negative for fever and appetite change.  HENT: Positive for congestion, postnasal drip, rhinorrhea and sinus pressure. Negative for sore throat.   Eyes: Negative for visual disturbance.  Respiratory: Positive for cough, shortness of breath and wheezing. Negative for chest tightness.   Cardiovascular: Negative for chest pain, palpitations and leg swelling.  Gastrointestinal: Negative for nausea, vomiting, abdominal pain, diarrhea and constipation.  Genitourinary: Negative for dysuria, urgency, frequency and flank pain.  Musculoskeletal: Negative for myalgias and back pain.  Skin: Negative for rash.  Neurological: Positive for headaches. Negative for dizziness, weakness, light-headedness and numbness.  Hematological: Does not bruise/bleed easily.      Allergies  Symbicort and Dulera  Home Medications   Prior to Admission medications   Medication Sig Start Date End Date Taking? Authorizing Provider  albuterol (PROVENTIL HFA;VENTOLIN HFA) 108 (90 BASE) MCG/ACT inhaler Inhale 2 puffs into the lungs every 6 (six) hours as needed for wheezing or shortness of breath. 06/28/15   Ramon Dredge Saguier, PA-C  amLODipine (NORVASC) 10 MG tablet TAKE 1 TABLET (10 MG) BY MOUTH DAILY. 08/29/15   Ramon Dredge Saguier, PA-C  atorvastatin (LIPITOR) 20 MG tablet Take 1 tablet (20 mg total) by mouth daily. 06/28/15   Esperanza Richters, PA-C  b complex  vitamins tablet Take 1 tablet by mouth daily.    Historical Provider, MD  cloNIDine (CATAPRES) 0.1 MG tablet TAKE 1 TABLET (0.1 MG TOTAL) BY MOUTH 2 (TWO) TIMES DAILY. 12/11/15   Ramon Dredge Saguier, PA-C  doxycycline (VIBRA-TABS) 100 MG tablet Take 1 tablet (100 mg total) by mouth 2 (two) times daily. 09/25/15   Ramon Dredge Saguier, PA-C  Fluticasone-Salmeterol (ADVAIR DISKUS) 500-50 MCG/DOSE AEPB INHALE 1 PUFF BY MOUTH INTO THE LUNGS 2 TIMES DAILY. 08/29/15   Ramon Dredge Saguier, PA-C  ibuprofen (ADVIL,MOTRIN)  200 MG tablet Take 200 mg by mouth every 6 (six) hours as needed.    Historical Provider, MD  levofloxacin (LEVAQUIN) 750 MG tablet Take 1 tablet (750 mg total) by mouth daily. 01/17/16   Leta Baptist, MD  lisinopril (PRINIVIL,ZESTRIL) 10 MG tablet TAKE 1 TABLET (10 MG) BY MOUTH DAILY. 08/29/15   Ramon Dredge Saguier, PA-C  Multiple Vitamin (MULTIVITAMIN WITH MINERALS) TABS tablet Take 1 tablet by mouth daily. 12/08/13   Elease Etienne, MD  nicotine (NICODERM CQ) 14 mg/24hr patch Place 1 patch (14 mg total) onto the skin daily. 08/29/15   Ramon Dredge Saguier, PA-C  predniSONE (DELTASONE) 20 MG tablet Take 2 tablets (40 mg total) by mouth daily. 01/18/16   Leta Baptist, MD  tiotropium (SPIRIVA HANDIHALER) 18 MCG inhalation capsule PLACE 1 CAPSULE INTO INHALER AND INHALE INTO LUNGS DAILY. 01/17/16   Leta Baptist, MD   BP 123/78 mmHg  Pulse 82  Temp(Src) 98.8 F (37.1 C) (Oral)  Resp 16  Ht 6' (1.829 m)  Wt 180 lb (81.647 kg)  BMI 24.41 kg/m2  SpO2 99% Physical Exam  Constitutional: He is oriented to person, place, and time. He appears well-developed and well-nourished. No distress.  HENT:  Head: Normocephalic and atraumatic.  Right Ear: External ear normal.  Left Ear: External ear normal.  Nose: Rhinorrhea and sinus tenderness present. No epistaxis. Right sinus exhibits maxillary sinus tenderness and frontal sinus tenderness. Left sinus exhibits maxillary sinus tenderness and frontal sinus tenderness.  Mouth/Throat: Oropharynx is clear and moist. No oropharyngeal exudate.  Poor transillumination of the bilateral frontal and maxillary sinuses. Postnasal drip noted on the posterior pharynx.  Eyes: EOM are normal. Pupils are equal, round, and reactive to light.  Neck: Normal range of motion. Neck supple.  Cardiovascular: Normal rate, regular rhythm, normal heart sounds and intact distal pulses.   No murmur heard. Pulmonary/Chest: Effort normal. No respiratory distress. He has wheezes (bilateral,  symmetric, diffuse). He has no rales.  Abdominal: Soft. He exhibits no distension. There is no tenderness.  Musculoskeletal: He exhibits no edema.  Neurological: He is alert and oriented to person, place, and time.  Skin: Skin is warm and dry. No rash noted. He is not diaphoretic.  Vitals reviewed.   ED Course  Procedures (including critical care time) Labs Review Labs Reviewed - No data to display  Imaging Review Dg Chest 2 View  01/17/2016  CLINICAL DATA:  Shortness of breath and cough for 3 days EXAM: CHEST  2 VIEW COMPARISON:  Chest radiograph Dec 06, 2013 and chest CT September 15, 2015 FINDINGS: There is a degree of underlying emphysematous change. There is patchy infiltrate in the right mid lung. There are scattered areas of mild scarring bilaterally. Heart size is normal. Pulmonary vascularity reflects the underlying emphysematous change and is stable. No adenopathy is evident. No bone lesions. IMPRESSION: Focal area of airspace disease, consistent with pneumonia, in the right mid lung. Underlying emphysematous change. Stable cardiac silhouette.  Followup PA and lateral chest radiographs recommended in 3-4 weeks following trial of antibiotic therapy to ensure resolution and exclude underlying malignancy. Electronically Signed   By: Bretta Bang III M.D.   On: 01/17/2016 09:29   I have personally reviewed and evaluated these images and lab results as part of my medical decision-making.   EKG Interpretation None      MDM  Patient was seen and evaluated in stable condition. Patient with diffuse wheezing on examination. Breathing treatment and prednisone given. On reevaluation patient felt improved. X-ray concerning for pneumonia in the right mid lung. This was discussed with the patient and his wife at bedside. Patient was given another breathing treatment. He was also provided with a spacer to use with his inhalers at home. He denied need for refill of his albuterol but did request a  refill of his Spiriva which was given to him. He was discharged home in stable condition with a prescription for Levaquin and strict return precautions. He and his wife expressed understanding and agreement with plan of care. Final diagnoses:  CAP (community acquired pneumonia)    1. CAP  2. COPD    Leta Baptist, MD 01/17/16 [id number]

## 2016-01-19 ENCOUNTER — Inpatient Hospital Stay (HOSPITAL_BASED_OUTPATIENT_CLINIC_OR_DEPARTMENT_OTHER)
Admission: EM | Admit: 2016-01-19 | Discharge: 2016-01-23 | DRG: 190 | Disposition: A | Discharge status: Home / Self Care | Payer: 59 | Attending: Internal Medicine | Admitting: Internal Medicine

## 2016-01-19 ENCOUNTER — Encounter (HOSPITAL_BASED_OUTPATIENT_CLINIC_OR_DEPARTMENT_OTHER): Payer: Self-pay | Admitting: Emergency Medicine

## 2016-01-19 ENCOUNTER — Emergency Department (HOSPITAL_BASED_OUTPATIENT_CLINIC_OR_DEPARTMENT_OTHER): Payer: 59

## 2016-01-19 DIAGNOSIS — I729 Aneurysm of unspecified site: Secondary | ICD-10-CM | POA: Diagnosis not present

## 2016-01-19 DIAGNOSIS — Z888 Allergy status to other drugs, medicaments and biological substances status: Secondary | ICD-10-CM | POA: Diagnosis not present

## 2016-01-19 DIAGNOSIS — R05 Cough: Secondary | ICD-10-CM | POA: Diagnosis present

## 2016-01-19 DIAGNOSIS — Z8249 Family history of ischemic heart disease and other diseases of the circulatory system: Secondary | ICD-10-CM

## 2016-01-19 DIAGNOSIS — Z7952 Long term (current) use of systemic steroids: Secondary | ICD-10-CM | POA: Diagnosis not present

## 2016-01-19 DIAGNOSIS — I671 Cerebral aneurysm, nonruptured: Secondary | ICD-10-CM | POA: Diagnosis present

## 2016-01-19 DIAGNOSIS — R55 Syncope and collapse: Secondary | ICD-10-CM | POA: Diagnosis present

## 2016-01-19 DIAGNOSIS — J440 Chronic obstructive pulmonary disease with acute lower respiratory infection: Principal | ICD-10-CM | POA: Diagnosis present

## 2016-01-19 DIAGNOSIS — I10 Essential (primary) hypertension: Secondary | ICD-10-CM | POA: Diagnosis present

## 2016-01-19 DIAGNOSIS — J189 Pneumonia, unspecified organism: Secondary | ICD-10-CM | POA: Diagnosis not present

## 2016-01-19 DIAGNOSIS — E785 Hyperlipidemia, unspecified: Secondary | ICD-10-CM | POA: Diagnosis present

## 2016-01-19 DIAGNOSIS — F17210 Nicotine dependence, cigarettes, uncomplicated: Secondary | ICD-10-CM | POA: Diagnosis not present

## 2016-01-19 DIAGNOSIS — J441 Chronic obstructive pulmonary disease with (acute) exacerbation: Secondary | ICD-10-CM | POA: Diagnosis not present

## 2016-01-19 DIAGNOSIS — M069 Rheumatoid arthritis, unspecified: Secondary | ICD-10-CM | POA: Diagnosis present

## 2016-01-19 DIAGNOSIS — I509 Heart failure, unspecified: Secondary | ICD-10-CM | POA: Diagnosis not present

## 2016-01-19 DIAGNOSIS — I6601 Occlusion and stenosis of right middle cerebral artery: Secondary | ICD-10-CM | POA: Diagnosis not present

## 2016-01-19 DIAGNOSIS — Z79899 Other long term (current) drug therapy: Secondary | ICD-10-CM

## 2016-01-19 DIAGNOSIS — R054 Cough syncope: Secondary | ICD-10-CM | POA: Diagnosis present

## 2016-01-19 DIAGNOSIS — R0602 Shortness of breath: Secondary | ICD-10-CM

## 2016-01-19 HISTORY — DX: Pneumonia, unspecified organism: J18.9

## 2016-01-19 HISTORY — DX: Unspecified chronic bronchitis: J42

## 2016-01-19 HISTORY — DX: Pure hypercholesterolemia, unspecified: E78.00

## 2016-01-19 HISTORY — DX: Adjustment disorder with depressed mood: F43.21

## 2016-01-19 HISTORY — DX: Hepatitis a without hepatic coma: B15.9

## 2016-01-19 LAB — CBC WITH DIFFERENTIAL/PLATELET
BASOS ABS: 0 10*3/uL (ref 0.0–0.1)
Basophils Relative: 0 %
EOS PCT: 1 %
Eosinophils Absolute: 0.1 10*3/uL (ref 0.0–0.7)
HCT: 40.9 % (ref 39.0–52.0)
Hemoglobin: 13.7 g/dL (ref 13.0–17.0)
LYMPHS ABS: 1.9 10*3/uL (ref 0.7–4.0)
Lymphocytes Relative: 17 %
MCH: 29.5 pg (ref 26.0–34.0)
MCHC: 33.5 g/dL (ref 30.0–36.0)
MCV: 88 fL (ref 78.0–100.0)
MONO ABS: 1.1 10*3/uL — AB (ref 0.1–1.0)
Monocytes Relative: 10 %
Neutro Abs: 8.2 10*3/uL — ABNORMAL HIGH (ref 1.7–7.7)
Neutrophils Relative %: 72 %
PLATELETS: 313 10*3/uL (ref 150–400)
RBC: 4.65 MIL/uL (ref 4.22–5.81)
RDW: 13.7 % (ref 11.5–15.5)
WBC: 11.3 10*3/uL — AB (ref 4.0–10.5)

## 2016-01-19 LAB — COMPREHENSIVE METABOLIC PANEL
ALT: 25 U/L (ref 17–63)
ANION GAP: 9 (ref 5–15)
AST: 27 U/L (ref 15–41)
Albumin: 4.1 g/dL (ref 3.5–5.0)
Alkaline Phosphatase: 60 U/L (ref 38–126)
BUN: 24 mg/dL — ABNORMAL HIGH (ref 6–20)
CALCIUM: 9.4 mg/dL (ref 8.9–10.3)
CHLORIDE: 104 mmol/L (ref 101–111)
CO2: 21 mmol/L — AB (ref 22–32)
Creatinine, Ser: 1.33 mg/dL — ABNORMAL HIGH (ref 0.61–1.24)
GFR calc non Af Amer: 59 mL/min — ABNORMAL LOW (ref >60)
GLUCOSE: 112 mg/dL — AB (ref 65–99)
Potassium: 4.1 mmol/L (ref 3.5–5.1)
Sodium: 134 mmol/L — ABNORMAL LOW (ref 135–145)
Total Bilirubin: 0.5 mg/dL (ref 0.3–1.2)
Total Protein: 7.5 g/dL (ref 6.5–8.1)

## 2016-01-19 LAB — I-STAT ARTERIAL BLOOD GAS, ED
Acid-base deficit: 3 mmol/L — ABNORMAL HIGH (ref 0.0–2.0)
BICARBONATE: 21.2 meq/L (ref 20.0–24.0)
O2 SAT: 91 %
PCO2 ART: 36.4 mmHg (ref 35.0–45.0)
PO2 ART: 62 mmHg — AB (ref 80.0–100.0)
Patient temperature: 37
TCO2: 22 mmol/L (ref 0–100)
pH, Arterial: 7.374 (ref 7.350–7.450)

## 2016-01-19 MED ORDER — ENOXAPARIN SODIUM 40 MG/0.4ML ~~LOC~~ SOLN: 40.0000 mg | SUBCUTANEOUS | Status: DC

## 2016-01-19 MED ORDER — LISINOPRIL 10 MG PO TABS
10.0000 mg | ORAL_TABLET | Freq: Every day | ORAL | Status: DC
  Administered 2016-01-19 – 2016-01-22 (×4): 10 mg via ORAL
  Filled 2016-01-19 (×3): qty 1

## 2016-01-19 MED ORDER — ALBUTEROL SULFATE (2.5 MG/3ML) 0.083% IN NEBU
2.5000 mg | INHALATION_SOLUTION | Freq: Four times a day (QID) | RESPIRATORY_TRACT | Status: DC | PRN
  Administered 2016-01-20: 2.5 mg via RESPIRATORY_TRACT
  Filled 2016-01-19: qty 3

## 2016-01-19 MED ORDER — ALBUTEROL SULFATE (2.5 MG/3ML) 0.083% IN NEBU
5.0000 mg | INHALATION_SOLUTION | Freq: Once | RESPIRATORY_TRACT | Status: AC
  Administered 2016-01-19: 5 mg via RESPIRATORY_TRACT
  Filled 2016-01-19: qty 6

## 2016-01-19 MED ORDER — AZITHROMYCIN 500 MG PO TABS
250.0000 mg | ORAL_TABLET | Freq: Every day | ORAL | Status: AC
  Administered 2016-01-20 – 2016-01-23 (×4): 250 mg via ORAL
  Filled 2016-01-19 (×4): qty 1

## 2016-01-19 MED ORDER — CLONIDINE HCL 0.1 MG PO TABS
0.1000 mg | ORAL_TABLET | Freq: Two times a day (BID) | ORAL | Status: DC
  Administered 2016-01-19 – 2016-01-23 (×8): 0.1 mg via ORAL
  Filled 2016-01-19 (×8): qty 1

## 2016-01-19 MED ORDER — BENZONATATE 100 MG PO CAPS
200.0000 mg | ORAL_CAPSULE | Freq: Three times a day (TID) | ORAL | Status: DC | PRN
  Administered 2016-01-19 – 2016-01-20 (×2): 200 mg via ORAL
  Filled 2016-01-19 (×2): qty 2

## 2016-01-19 MED ORDER — IBUPROFEN 200 MG PO TABS: 200.0000 mg | ORAL_TABLET | Freq: Four times a day (QID) | ORAL | Status: DC | PRN

## 2016-01-19 MED ORDER — AMLODIPINE BESYLATE 10 MG PO TABS
10.0000 mg | ORAL_TABLET | Freq: Every day | ORAL | Status: DC
  Administered 2016-01-19 – 2016-01-22 (×4): 10 mg via ORAL
  Filled 2016-01-19 (×3): qty 1

## 2016-01-19 MED ORDER — TIOTROPIUM BROMIDE MONOHYDRATE 18 MCG IN CAPS
18.0000 ug | ORAL_CAPSULE | Freq: Every day | RESPIRATORY_TRACT | Status: DC
  Administered 2016-01-21 – 2016-01-23 (×3): 18 ug via RESPIRATORY_TRACT
  Filled 2016-01-19: qty 5

## 2016-01-19 MED ORDER — FLUTICASONE-SALMETEROL 500-50 MCG/DOSE IN AEPB
1.0000 | INHALATION_SPRAY | Freq: Two times a day (BID) | RESPIRATORY_TRACT | Status: DC
  Administered 2016-01-20 – 2016-01-23 (×7): 1 via RESPIRATORY_TRACT

## 2016-01-19 MED ORDER — IPRATROPIUM-ALBUTEROL 0.5-2.5 (3) MG/3ML IN SOLN
3.0000 mL | Freq: Once | RESPIRATORY_TRACT | Status: AC
  Administered 2016-01-19: 3 mL via RESPIRATORY_TRACT
  Filled 2016-01-19: qty 3

## 2016-01-19 MED ORDER — LISINOPRIL 10 MG PO TABS
10.0000 mg | ORAL_TABLET | Freq: Every day | ORAL | Status: DC
  Filled 2016-01-19: qty 1

## 2016-01-19 MED ORDER — AZITHROMYCIN 500 MG PO TABS
500.0000 mg | ORAL_TABLET | Freq: Every day | ORAL | Status: AC
  Administered 2016-01-19: 500 mg via ORAL
  Filled 2016-01-19: qty 1

## 2016-01-19 MED ORDER — NICOTINE 14 MG/24HR TD PT24
14.0000 mg | MEDICATED_PATCH | Freq: Every day | TRANSDERMAL | Status: DC
  Filled 2016-01-19 (×3): qty 1

## 2016-01-19 MED ORDER — ALBUTEROL SULFATE (2.5 MG/3ML) 0.083% IN NEBU
2.5000 mg | INHALATION_SOLUTION | Freq: Once | RESPIRATORY_TRACT | Status: AC
  Administered 2016-01-19: 2.5 mg via RESPIRATORY_TRACT
  Filled 2016-01-19: qty 3

## 2016-01-19 MED ORDER — ATORVASTATIN CALCIUM 20 MG PO TABS
20.0000 mg | ORAL_TABLET | Freq: Every day | ORAL | Status: DC
  Administered 2016-01-20 – 2016-01-22 (×3): 20 mg via ORAL
  Filled 2016-01-19 (×3): qty 1

## 2016-01-19 MED ORDER — MOMETASONE FURO-FORMOTEROL FUM 200-5 MCG/ACT IN AERO
2.0000 | INHALATION_SPRAY | Freq: Two times a day (BID) | RESPIRATORY_TRACT | Status: DC
  Filled 2016-01-19: qty 8.8

## 2016-01-19 MED ORDER — AMLODIPINE BESYLATE 10 MG PO TABS
10.0000 mg | ORAL_TABLET | Freq: Every day | ORAL | Status: DC
  Filled 2016-01-19: qty 1

## 2016-01-19 MED ORDER — METHYLPREDNISOLONE SODIUM SUCC 125 MG IJ SOLR
125.0000 mg | Freq: Once | INTRAMUSCULAR | Status: AC
  Administered 2016-01-19: 125 mg via INTRAVENOUS
  Filled 2016-01-19: qty 2

## 2016-01-19 MED ORDER — CALCIUM CARBONATE ANTACID 500 MG PO CHEW
1.0000 | CHEWABLE_TABLET | Freq: Two times a day (BID) | ORAL | Status: DC | PRN
  Administered 2016-01-19 – 2016-01-21 (×3): 200 mg via ORAL
  Filled 2016-01-19 (×3): qty 1

## 2016-01-19 MED ORDER — ENOXAPARIN SODIUM 40 MG/0.4ML ~~LOC~~ SOLN
40.0000 mg | SUBCUTANEOUS | Status: DC
  Filled 2016-01-19: qty 0.4

## 2016-01-19 MED ORDER — ALBUTEROL SULFATE HFA 108 (90 BASE) MCG/ACT IN AERS: 2.0000 | INHALATION_SPRAY | Freq: Four times a day (QID) | RESPIRATORY_TRACT | Status: DC | PRN

## 2016-01-19 MED ORDER — DEXTROSE 5 % IV SOLN
1.0000 g | INTRAVENOUS | Status: DC
  Administered 2016-01-19 – 2016-01-22 (×4): 1 g via INTRAVENOUS
  Filled 2016-01-19 (×5): qty 10

## 2016-01-19 MED ORDER — PREDNISONE 50 MG PO TABS
50.0000 mg | ORAL_TABLET | Freq: Every day | ORAL | Status: DC
  Administered 2016-01-20 – 2016-01-23 (×4): 50 mg via ORAL
  Filled 2016-01-19 (×4): qty 1

## 2016-01-19 MED ORDER — DOXYCYCLINE HYCLATE 100 MG PO TABS: 100.0000 mg | ORAL_TABLET | Freq: Two times a day (BID) | ORAL | Status: DC

## 2016-01-19 NOTE — ED Notes (Addendum)
Patient taking HHN as ordered, spontaneous cough and patient became unresponsive 10 seconds ( eyes back leftward gaze, HHN stopped HR 130, BP after episode 172/84.

## 2016-01-19 NOTE — ED Notes (Signed)
Pt a/o at this time, wife states pt had seizure, no hx, unwitnessed by RN.  Informed provider.  PT HR back to normal and sitting up talking

## 2016-01-19 NOTE — ED Notes (Signed)
Patient transported to X-ray 

## 2016-01-19 NOTE — H&P (Signed)
History and Physical    Derek Conner ZOX:[id number] DOB: 06-15-1961 DOA: 01/19/2016   PCP: Esperanza Richters, PA-C Chief Complaint:  Chief Complaint  Patient presents with  . Shortness of Breath    HPI: Derek Conner is a 55 y.o. male with medical history significant of COPD, patient presents to the ED with c/o ongoing cough, SOB.  Nothing makes symptoms better or worse.  Symptoms ongoing for several days now, got CXR 2 days ago and found to have RML nodular opacity diagnosed as CAP.  Started on levaquin and prednisone, no improvement since that time so returns to ED.  ED Course: Had episode of cough syncope while in ED.  Also has new O2 requirement.  Started on treatment for COPD and transferred to [facility].  Review of Systems: As per HPI otherwise 10 point review of systems negative.    Past Medical History  Diagnosis Date  . COPD (chronic obstructive pulmonary disease) (HCC)   . Emphysema lung (HCC)   . Hypertension   . Asthma   . Rheumatoid arthritis (HCC)     "qwhere" (12/04/2013)  . Depression   . Emphysema of lung [facility])     Past Surgical History  Procedure Laterality Date  . Appendectomy  1980's  . Foot fracture surgery Left ~ 2005    "it was crushed"  . Cystectomy Left 1960's    "wrist"  . Chest tube insertion Left 1990's    "lung collapsed"  . Video bronchoscopy Bilateral 12/06/2013    Procedure: VIDEO BRONCHOSCOPY WITHOUT FLUORO;  Surgeon: Alyson Reedy, MD;  [facility] ENDOSCOPY;  Service: Cardiopulmonary;  Laterality: Bilateral;     reports that he has been smoking Cigarettes.  He has a 18.5 pack-year smoking history. He has never used smokeless tobacco. He reports that he drinks about 3.6 oz of alcohol per week. He reports that he uses illicit drugs (Marijuana).  Allergies  Allergen Reactions  . Symbicort [Budesonide-Formoterol Fumarate] Shortness Of Breath, Nausea And Vomiting and Other (See Comments)    heachache  . Dulera [Mometasone Furo-Formoterol  Fum]     Headache    Family History  Problem Relation Age of Onset  . Cancer Mother   . Arthritis Mother   . Stroke Father   . Hypertension Father   . Colon cancer Neg Hx       Prior to Admission medications   Medication Sig Start Date End Date Taking? Authorizing Provider  albuterol (PROVENTIL HFA;VENTOLIN HFA) 108 (90 BASE) MCG/ACT inhaler Inhale 2 puffs into the lungs every 6 (six) hours as needed for wheezing or shortness of breath. 06/28/15   Ramon Dredge Saguier, PA-C  amLODipine (NORVASC) 10 MG tablet TAKE 1 TABLET (10 MG) BY MOUTH DAILY. 08/29/15   Ramon Dredge Saguier, PA-C  atorvastatin (LIPITOR) 20 MG tablet Take 1 tablet (20 mg total) by mouth daily. 06/28/15   Esperanza Richters, PA-C  b complex vitamins tablet Take 1 tablet by mouth daily.    Historical Provider, MD  cloNIDine (CATAPRES) 0.1 MG tablet TAKE 1 TABLET (0.1 MG TOTAL) BY MOUTH 2 (TWO) TIMES DAILY. 12/11/15   Ramon Dredge Saguier, PA-C  doxycycline (VIBRA-TABS) 100 MG tablet Take 1 tablet (100 mg total) by mouth 2 (two) times daily. 09/25/15   Ramon Dredge Saguier, PA-C  Fluticasone-Salmeterol (ADVAIR DISKUS) 500-50 MCG/DOSE AEPB INHALE 1 PUFF BY MOUTH INTO THE LUNGS 2 TIMES DAILY. 08/29/15   Ramon Dredge Saguier, PA-C  ibuprofen (ADVIL,MOTRIN) 200 MG tablet Take 200 mg by mouth every 6 (six) hours as needed.  Historical Provider, MD  levofloxacin (LEVAQUIN) 750 MG tablet Take 1 tablet (750 mg total) by mouth daily. 01/17/16   Leta Baptist, MD  lisinopril (PRINIVIL,ZESTRIL) 10 MG tablet TAKE 1 TABLET (10 MG) BY MOUTH DAILY. 08/29/15   Ramon Dredge Saguier, PA-C  Multiple Vitamin (MULTIVITAMIN WITH MINERALS) TABS tablet Take 1 tablet by mouth daily. 12/08/13   Elease Etienne, MD  nicotine (NICODERM CQ) 14 mg/24hr patch Place 1 patch (14 mg total) onto the skin daily. 08/29/15   Ramon Dredge Saguier, PA-C  predniSONE (DELTASONE) 20 MG tablet Take 2 tablets (40 mg total) by mouth daily. 01/18/16   Leta Baptist, MD  tiotropium (SPIRIVA HANDIHALER) 18 MCG  inhalation capsule PLACE 1 CAPSULE INTO INHALER AND INHALE INTO LUNGS DAILY. 01/17/16   Leta Baptist, MD    Physical Exam: Filed Vitals:   01/19/16 1600 01/19/16 1630 01/19/16 1700 01/19/16 1953  BP: 143/94 122/90 129/89 139/84  Pulse: 91 84 83 79  Temp:    98.2 F (36.8 C)  TempSrc:    Oral  Resp: 18 12 14 18   Height:      Weight:      SpO2: 97% 96% 97% 98%      Constitutional: NAD, calm, comfortable Eyes: PERRL, lids and conjunctivae normal ENMT: Mucous membranes are moist. Posterior pharynx clear of any exudate or lesions.Normal dentition.  Neck: normal, supple, no masses, no thyromegaly Respiratory: Diminished in R lung, wet sounding cough Cardiovascular: Regular rate and rhythm, no murmurs / rubs / gallops. No extremity edema. 2+ pedal pulses. No carotid bruits.  Abdomen: no tenderness, no masses palpated. No hepatosplenomegaly. Bowel sounds positive.  Musculoskeletal: no clubbing / cyanosis. No joint deformity upper and lower extremities. Good ROM, no contractures. Normal muscle tone.  Skin: no rashes, lesions, ulcers. No induration Neurologic: CN 2-12 grossly intact. Sensation intact, DTR normal. Strength 5/5 in all 4.  Psychiatric: Normal judgment and insight. Alert and oriented x 3. Normal mood.    Labs on Admission: I have personally reviewed following labs and imaging studies  CBC:  Recent Labs Lab 01/19/16 1110  WBC 11.3*  NEUTROABS 8.2*  HGB 13.7  HCT 40.9  MCV 88.0  PLT 313   Basic Metabolic Panel:  Recent Labs Lab 01/19/16 1110  NA 134*  K 4.1  CL 104  CO2 21*  GLUCOSE 112*  BUN 24*  CREATININE 1.33*  CALCIUM 9.4   GFR: Estimated Creatinine Clearance: 68.9 mL/min (by C-G formula based on Cr of 1.33). Liver Function Tests:  Recent Labs Lab 01/19/16 1110  AST 27  ALT 25  ALKPHOS 60  BILITOT 0.5  PROT 7.5  ALBUMIN 4.1   No results for input(s): LIPASE, AMYLASE in the last 168 hours. No results for input(s): AMMONIA in the last  168 hours. Coagulation Profile: No results for input(s): INR, PROTIME in the last 168 hours. Cardiac Enzymes: No results for input(s): CKTOTAL, CKMB, CKMBINDEX, TROPONINI in the last 168 hours. BNP (last 3 results) No results for input(s): PROBNP in the last 8760 hours. HbA1C: No results for input(s): HGBA1C in the last 72 hours. CBG: No results for input(s): GLUCAP in the last 168 hours. Lipid Profile: No results for input(s): CHOL, HDL, LDLCALC, TRIG, CHOLHDL, LDLDIRECT in the last 72 hours. Thyroid Function Tests: No results for input(s): TSH, T4TOTAL, FREET4, T3FREE, THYROIDAB in the last 72 hours. Anemia Panel: No results for input(s): VITAMINB12, FOLATE, FERRITIN, TIBC, IRON, RETICCTPCT in the last 72 hours. Urine analysis:  Component Value Date/Time   BILIRUBINUR neg 08/29/2015 1213   PROTEINUR neg 08/29/2015 1213   UROBILINOGEN 0.2 08/29/2015 1213   NITRITE neg 08/29/2015 1213   LEUKOCYTESUR small (1+)* 08/29/2015 1213   Sepsis Labs: @LABRCNTIP (procalcitonin:4,lacticidven:4) )No results found for this or any previous visit (from the past 240 hour(s)).   Radiological Exams on Admission: Dg Chest 2 View  01/19/2016  CLINICAL DATA:  Cough for 5-6 days.  Shortness of breath. EXAM: CHEST  2 VIEW COMPARISON:  CT chest 09/15/2015.  PA and lateral chest 01/17/2016. FINDINGS: The lungs are severely emphysematous. Small focus of nodular opacity in the right upper lobe is unchanged. No pneumothorax or pleural effusion. Heart size is normal. IMPRESSION: No acute disease. Severe emphysema.  No change in a nodular opacity. Electronically Signed   By: 01/19/2016 M.D.   On: 01/19/2016 11:38    EKG: Independently reviewed.  Assessment/Plan Principal Problem:   CAP (community acquired pneumonia) Active Problems:   COPD with exacerbation (HCC)   Cough syncope    CAP -  PNA pathway  Switching levaquin to rocephin / azithromycin  Cultures pending  If no improvement in RML  PNA then will need CT chest  New O2 requirement  COPD with exacerbation -  Adult wheeze protocol  Prednisone  Cough syncope -  Suppressing cough with Tessalon   DVT prophylaxis: lovenox Code Status: Full Family Communication: Family at bedside Consults called: None Admission status: Admit to inpatient   01/21/2016 DO [facility] Pager [phone] from 7PM-7AM  If 7AM-7PM, please contact the day physician for the patient [email or web address] Password [email or web address]  01/19/2016, 8:20 PM

## 2016-01-19 NOTE — ED Notes (Signed)
[facility] is aware of bed 5W37 at [facility]

## 2016-01-19 NOTE — ED Notes (Signed)
Pt seen on Wednesday dx with PNA and placed on antibiotics and told if he doesn't feel better to come back

## 2016-01-19 NOTE — ED Provider Notes (Signed)
CSN: [id number]     Arrival date & time 01/19/16  1040 History   First MD Initiated Contact with Patient 01/19/16 1047     Chief Complaint  Patient presents with  . Shortness of Breath     (Consider location/radiation/quality/duration/timing/severity/associated sxs/prior Treatment) Patient is a 55 y.o. male presenting with shortness of breath. The history is provided by the patient. No language interpreter was used.  Shortness of Breath Severity:  Severe Onset quality:  Gradual Timing:  Constant Progression:  Worsening Chronicity:  New Context: URI   Relieved by:  Nothing Worsened by:  Nothing tried Ineffective treatments:  None tried Associated symptoms: no abdominal pain   Risk factors: tobacco use   Risk factors: no prolonged immobilization   Pt was seen here 2 days ago and diagnosed with pneumonia.   Pt has a history of COPD and emphysema.  Pt reports increased difficulty breathing today.  Pt reports coughing and unable to breath.  Pt reports he was told to return if worse for poassible admission  Past Medical History  Diagnosis Date  . COPD (chronic obstructive pulmonary disease) (HCC)   . Emphysema lung (HCC)   . Hypertension   . Asthma   . Rheumatoid arthritis (HCC)     "qwhere" (12/04/2013)  . Depression   . Emphysema of lung [facility])    Past Surgical History  Procedure Laterality Date  . Appendectomy  1980's  . Foot fracture surgery Left ~ 2005    "it was crushed"  . Cystectomy Left 1960's    "wrist"  . Chest tube insertion Left 1990's    "lung collapsed"  . Video bronchoscopy Bilateral 12/06/2013    Procedure: VIDEO BRONCHOSCOPY WITHOUT FLUORO;  Surgeon: Alyson Reedy, MD;  Location: [facility] ENDOSCOPY;  Service: Cardiopulmonary;  Laterality: Bilateral;   Family History  Problem Relation Age of Onset  . Cancer Mother   . Arthritis Mother   . Stroke Father   . Hypertension Father   . Colon cancer Neg Hx    Social History  Substance Use Topics  . Smoking status:  Current Every Day Smoker -- 0.50 packs/day for 37 years    Types: Cigarettes  . Smokeless tobacco: Never Used     Comment: down to 6 cigs/day 3.31.16  . Alcohol Use: 3.6 oz/week    6 Cans of beer per week     Comment: 12/04/2013 "6-8 beers/day"    Review of Systems  Respiratory: Positive for shortness of breath.   Gastrointestinal: Negative for abdominal pain.  All other systems reviewed and are negative.     Allergies  Symbicort and Dulera  Home Medications   Prior to Admission medications   Medication Sig Start Date End Date Taking? Authorizing Provider  albuterol (PROVENTIL HFA;VENTOLIN HFA) 108 (90 BASE) MCG/ACT inhaler Inhale 2 puffs into the lungs every 6 (six) hours as needed for wheezing or shortness of breath. 06/28/15   Ramon Dredge Saguier, PA-C  amLODipine (NORVASC) 10 MG tablet TAKE 1 TABLET (10 MG) BY MOUTH DAILY. 08/29/15   Ramon Dredge Saguier, PA-C  atorvastatin (LIPITOR) 20 MG tablet Take 1 tablet (20 mg total) by mouth daily. 06/28/15   Esperanza Richters, PA-C  b complex vitamins tablet Take 1 tablet by mouth daily.    Historical Provider, MD  cloNIDine (CATAPRES) 0.1 MG tablet TAKE 1 TABLET (0.1 MG TOTAL) BY MOUTH 2 (TWO) TIMES DAILY. 12/11/15   Ramon Dredge Saguier, PA-C  doxycycline (VIBRA-TABS) 100 MG tablet Take 1 tablet (100 mg total) by mouth 2 (  two) times daily. 09/25/15   Ramon Dredge Saguier, PA-C  Fluticasone-Salmeterol (ADVAIR DISKUS) 500-50 MCG/DOSE AEPB INHALE 1 PUFF BY MOUTH INTO THE LUNGS 2 TIMES DAILY. 08/29/15   Ramon Dredge Saguier, PA-C  ibuprofen (ADVIL,MOTRIN) 200 MG tablet Take 200 mg by mouth every 6 (six) hours as needed.    Historical Provider, MD  levofloxacin (LEVAQUIN) 750 MG tablet Take 1 tablet (750 mg total) by mouth daily. 01/17/16   Leta Baptist, MD  lisinopril (PRINIVIL,ZESTRIL) 10 MG tablet TAKE 1 TABLET (10 MG) BY MOUTH DAILY. 08/29/15   Ramon Dredge Saguier, PA-C  Multiple Vitamin (MULTIVITAMIN WITH MINERALS) TABS tablet Take 1 tablet by mouth daily. 12/08/13   Elease Etienne, MD  nicotine (NICODERM CQ) 14 mg/24hr patch Place 1 patch (14 mg total) onto the skin daily. 08/29/15   Ramon Dredge Saguier, PA-C  predniSONE (DELTASONE) 20 MG tablet Take 2 tablets (40 mg total) by mouth daily. 01/18/16   Leta Baptist, MD  tiotropium (SPIRIVA HANDIHALER) 18 MCG inhalation capsule PLACE 1 CAPSULE INTO INHALER AND INHALE INTO LUNGS DAILY. 01/17/16   Leta Baptist, MD   BP 141/81 mmHg  Pulse 95  Temp(Src) 98.9 F (37.2 C) (Oral)  Resp 18  Ht 6' (1.829 m)  Wt 81.647 kg  BMI 24.41 kg/m2  SpO2 96% Physical Exam  Constitutional: He is oriented to person, place, and time. He appears well-developed and well-nourished.  HENT:  Head: Normocephalic.  Eyes: EOM are normal.  Neck: Normal range of motion.  Cardiovascular: Normal rate and normal heart sounds.   Pulmonary/Chest: He has wheezes.  Abdominal: Soft. He exhibits no distension.  Musculoskeletal: Normal range of motion.  Neurological: He is alert and oriented to person, place, and time.  Psychiatric: He has a normal mood and affect.  Nursing note and vitals reviewed.   ED Course  Procedures (including critical care time) Labs Review Labs Reviewed  CBC WITH DIFFERENTIAL/PLATELET - Abnormal; Notable for the following:    WBC 11.3 (*)    Neutro Abs 8.2 (*)    Monocytes Absolute 1.1 (*)    All other components within normal limits  COMPREHENSIVE METABOLIC PANEL - Abnormal; Notable for the following:    Sodium 134 (*)    CO2 21 (*)    Glucose, Bld 112 (*)    BUN 24 (*)    Creatinine, Ser 1.33 (*)    GFR calc non Af Amer 59 (*)    All other components within normal limits    Imaging Review Dg Chest 2 View  01/19/2016  CLINICAL DATA:  Cough for 5-6 days.  Shortness of breath. EXAM: CHEST  2 VIEW COMPARISON:  CT chest 09/15/2015.  PA and lateral chest 01/17/2016. FINDINGS: The lungs are severely emphysematous. Small focus of nodular opacity in the right upper lobe is unchanged. No pneumothorax or pleural  effusion. Heart size is normal. IMPRESSION: No acute disease. Severe emphysema.  No change in a nodular opacity. Electronically Signed   By: Drusilla Kanner M.D.   On: 01/19/2016 11:38   I have personally reviewed and evaluated these images and lab results as part of my medical decision-making.   EKG Interpretation None      MDM  reexam wheezing lower lobes.   I gave pt solumedrol Iv.   Chest xray shows severe COPD.  Pt felt a little better after second neb treatment.  Respiratory therapist reports pt had a syncopal episode with second neb treatment.   Resp therapist was in the room.  Pt dropped nebulizer, turned head to side,  Pt was coughing during episode.  Possible vasovagal,  Wife concerned about seizure    Final diagnoses:  Acute exacerbation of chronic obstructive pulmonary disease (COPD) (HCC)    I spoke to Dr. Konrad Dolores. Pt's wife concerned that pt had a seizure.  Episode lasted for short period of time, occurred while coughing.  Hospitalist advised.   Pt to be admitted for further treatment.    Elson Areas, PA-C 01/19/16 [address] [place], PA-C 01/19/16 1623  Alvira Monday, MD 01/21/16 [phone]

## 2016-01-20 LAB — EXPECTORATED SPUTUM ASSESSMENT W REFEX TO RESP CULTURE

## 2016-01-20 LAB — BASIC METABOLIC PANEL
Anion gap: 9 (ref 5–15)
BUN: 25 mg/dL — AB (ref 6–20)
CHLORIDE: 102 mmol/L (ref 101–111)
CO2: 23 mmol/L (ref 22–32)
Calcium: 9.4 mg/dL (ref 8.9–10.3)
Creatinine, Ser: 1.32 mg/dL — ABNORMAL HIGH (ref 0.61–1.24)
GFR calc Af Amer: >60 mL/min (ref >60)
GFR calc non Af Amer: 59 mL/min — ABNORMAL LOW (ref >60)
GLUCOSE: 127 mg/dL — AB (ref 65–99)
POTASSIUM: 4.5 mmol/L (ref 3.5–5.1)
Sodium: 134 mmol/L — ABNORMAL LOW (ref 135–145)

## 2016-01-20 LAB — EXPECTORATED SPUTUM ASSESSMENT W GRAM STAIN, RFLX TO RESP C

## 2016-01-20 LAB — HIV ANTIBODY (ROUTINE TESTING W REFLEX): HIV Screen 4th Generation wRfx: NONREACTIVE

## 2016-01-20 LAB — STREP PNEUMONIAE URINARY ANTIGEN: Strep Pneumo Urinary Antigen: NEGATIVE

## 2016-01-20 MED ORDER — ALBUTEROL SULFATE (2.5 MG/3ML) 0.083% IN NEBU
INHALATION_SOLUTION | RESPIRATORY_TRACT | Status: AC
  Filled 2016-01-20: qty 3

## 2016-01-20 MED ORDER — ALBUTEROL SULFATE (2.5 MG/3ML) 0.083% IN NEBU
2.5000 mg | INHALATION_SOLUTION | RESPIRATORY_TRACT | Status: DC | PRN
  Administered 2016-01-20 – 2016-01-21 (×3): 2.5 mg via RESPIRATORY_TRACT
  Filled 2016-01-20 (×2): qty 3

## 2016-01-20 MED ORDER — DEXTROMETHORPHAN POLISTIREX ER 30 MG/5ML PO SUER
30.0000 mg | Freq: Two times a day (BID) | ORAL | Status: DC
  Administered 2016-01-20 – 2016-01-23 (×7): 30 mg via ORAL
  Filled 2016-01-20 (×8): qty 5

## 2016-01-20 MED ORDER — ALBUTEROL SULFATE (2.5 MG/3ML) 0.083% IN NEBU
5.0000 mg | INHALATION_SOLUTION | Freq: Once | RESPIRATORY_TRACT | Status: AC
  Administered 2016-01-20: 5 mg via RESPIRATORY_TRACT

## 2016-01-20 NOTE — Progress Notes (Signed)
[facility] PROGRESS NOTE  Derek Conner ZOX:[id number] DOB: 06/06/1961 DOA: 01/19/2016 PCP: Esperanza Richters, PA-C  Assessment/Plan:   CAP - PNA pathway             Albuterol q2 prn sob Was on doxy 6/16, now on rocephin / azithromycin 6/16 - > present; day 2/10 Cultures pending, strep pna antigen neg If no improvement in RML PNA then will need CT chest New O2 requirement  COPD with exacerbation - Adult wheeze protocol Prednisone 50mg  qd  Cough syncope - Suppressing cough with Tessalon Delsym when necessary bid   Code Status: full Family Communication: wife at bedside Disposition Plan: home   Consultants: n/a Procedures:  none  Antibiotics:  See above (indicate start date, and stop date if known)  HPI/Subjective: Patient complains of ongoing cough. Patient states work of breathing mildly improved.  Patient states cough is still productive of clear white sputum. Denies chest pain nausea vomiting diarrhea.  Objective: Filed Vitals:   01/20/16 0508 01/20/16 0625  BP:  126/78  Pulse: 87 72  Temp:  97.9 F (36.6 C)  Resp: 20 20    Intake/Output Summary (Last 24 hours) at 01/20/16 1111 Last data filed at 01/20/16 0930  Gross per 24 hour  Intake    240 ml  Output      0 ml  Net    240 ml   Filed Weights   01/19/16 1045  Weight: 81.647 kg (180 lb)    Exam:  General:  No diaphoresis, anxious, no acute distress Cardiovascular: Regular rate and rhythm no murmurs rubs or gallops Respiratory: diffuse wheezing, increased work of breathing, mildly so sensory muscles. Abdomen: Nondistended bowel sounds normal nontender palpation Musculoskeletal: Moving all extremities, no deformity, 5 out of 5 strength    Data Reviewed: Basic Metabolic Panel:  Recent Labs Lab 01/19/16 1110 01/20/16 0520  NA 134* 134*  K 4.1 4.5  CL 104 102  CO2 21* 23  GLUCOSE  112* 127*  BUN 24* 25*  CREATININE 1.33* 1.32*  CALCIUM 9.4 9.4   Liver Function Tests:  Recent Labs Lab 01/19/16 1110  AST 27  ALT 25  ALKPHOS 60  BILITOT 0.5  PROT 7.5  ALBUMIN 4.1   No results for input(s): LIPASE, AMYLASE in the last 168 hours. No results for input(s): AMMONIA in the last 168 hours. CBC:  Recent Labs Lab 01/19/16 1110  WBC 11.3*  NEUTROABS 8.2*  HGB 13.7  HCT 40.9  MCV 88.0  PLT 313   Cardiac Enzymes: No results for input(s): CKTOTAL, CKMB, CKMBINDEX, TROPONINI in the last 168 hours. BNP (last 3 results) No results for input(s): BNP in the last 8760 hours.  ProBNP (last 3 results) No results for input(s): PROBNP in the last 8760 hours.  CBG: No results for input(s): GLUCAP in the last 168 hours.  Recent Results (from the past 240 hour(s))  Culture, sputum-assessment     Status: None   Collection Time: 01/20/16 12:55 AM  Result Value Ref Range Status   Specimen Description EXPECTORATED SPUTUM  Final   Special Requests NONE  Final   Sputum evaluation   Final    THIS SPECIMEN IS ACCEPTABLE. RESPIRATORY CULTURE REPORT TO FOLLOW.   Report Status 01/20/2016 FINAL  Final  Culture, respiratory (NON-Expectorated)     Status: None (Preliminary result)   Collection Time: 01/20/16 12:55 AM  Result Value Ref Range Status   Specimen Description EXPECTORATED SPUTUM  Final   Special Requests NONE  Final  Gram Stain   Final    RARE SQUAMOUS EPITHELIAL CELLS PRESENT RARE WBC PRESENT, PREDOMINANTLY MONONUCLEAR FEW GRAM POSITIVE COCCI IN PAIRS FEW GRAM POSITIVE RODS    Culture PENDING  Incomplete   Report Status PENDING  Incomplete     Studies: Dg Chest 2 View  01/19/2016  CLINICAL DATA:  Cough for 5-6 days.  Shortness of breath. EXAM: CHEST  2 VIEW COMPARISON:  CT chest 09/15/2015.  PA and lateral chest 01/17/2016. FINDINGS: The lungs are severely emphysematous. Small focus of nodular opacity in the right upper lobe is unchanged. No pneumothorax  or pleural effusion. Heart size is normal. IMPRESSION: No acute disease. Severe emphysema.  No change in a nodular opacity. Electronically Signed   By: Drusilla Kanner M.D.   On: 01/19/2016 11:38    Scheduled Meds: . amLODipine  10 mg Oral QHS  . atorvastatin  20 mg Oral q1800  . azithromycin  250 mg Oral Daily  . cefTRIAXone (ROCEPHIN)  IV  1 g Intravenous Q24H  . cloNIDine  0.1 mg Oral BID  . enoxaparin (LOVENOX) injection  40 mg Subcutaneous Q24H  . Fluticasone-Salmeterol  1 puff Inhalation BID  . lisinopril  10 mg Oral QHS  . nicotine  14 mg Transdermal Daily  . predniSONE  50 mg Oral Q breakfast  . tiotropium  18 mcg Inhalation Daily   Continuous Infusions:   Principal Problem:   CAP (community acquired pneumonia) Active Problems:   COPD with exacerbation (HCC)   Cough syncope    Time spent: 30    Haydee Salter  [facility] Pager [phone]. If 7PM-7AM, please contact night-coverage at [email or web address], password [facility] 01/20/2016, 11:11 AM  LOS: 1 day

## 2016-01-21 ENCOUNTER — Inpatient Hospital Stay (HOSPITAL_COMMUNITY): Payer: 59

## 2016-01-21 ENCOUNTER — Other Ambulatory Visit: Payer: Self-pay

## 2016-01-21 DIAGNOSIS — J189 Pneumonia, unspecified organism: Secondary | ICD-10-CM

## 2016-01-21 MED ORDER — ASPIRIN 81 MG PO CHEW
81.0000 mg | CHEWABLE_TABLET | Freq: Every day | ORAL | Status: DC
  Administered 2016-01-21 – 2016-01-23 (×3): 81 mg via ORAL
  Filled 2016-01-21 (×3): qty 1

## 2016-01-21 MED ORDER — IPRATROPIUM-ALBUTEROL 0.5-2.5 (3) MG/3ML IN SOLN
3.0000 mL | Freq: Four times a day (QID) | RESPIRATORY_TRACT | Status: DC
  Administered 2016-01-21 – 2016-01-23 (×10): 3 mL via RESPIRATORY_TRACT
  Filled 2016-01-21 (×11): qty 3

## 2016-01-21 MED ORDER — ENOXAPARIN SODIUM 40 MG/0.4ML ~~LOC~~ SOLN
40.0000 mg | SUBCUTANEOUS | Status: DC
  Filled 2016-01-21 (×2): qty 0.4

## 2016-01-21 NOTE — Progress Notes (Addendum)
PROGRESS NOTE                                                                                                                                                                                                             Patient Demographics:    Derek Conner, is a 55 y.o. male, DOB - February 25, 1961, NWG:[id number]  Admit date - 01/19/2016   Admitting Physician Ozella Rocks, MD  Outpatient Primary MD for the patient is Saguier, Kateri Mc  LOS - 2  Chief Complaint  Patient presents with  . Shortness of Breath       Brief Narrative   Derek Conner is a 55 y.o. male with medical history significant of COPD, patient presents to the ED with c/o ongoing cough, SOB. Nothing makes symptoms better or worse. Symptoms ongoing for several days now, got CXR 2 days ago and found to have RML nodular opacity diagnosed as CAP. Started on levaquin and prednisone, no improvement since that time so returns to ED.  ED Course: Had episode of cough syncope while in ED. Also has new O2 requirement. Started on treatment for COPD and transferred to [facility].   Subjective:    Derek Conner today has, No headache, No chest pain, No abdominal pain - No Nausea, No new weakness tingling or numbness, Improved Cough - SOB.     Assessment  & Plan :    1.Community-acquired pneumonia with mild COPD exacerbation. Clinically much improved, chest x-ray much improved, we'll try to titrate off oxygen and increase activity, follow cultures, currently on azithromycin and Rocephin, failed outpatient Levaquin. Continue supportive care. Counseled to quit smoking.  2. COPD exacerbation mild with ongoing smoking. On oral steroids, antibiotics as above, supportive care with oxygen and nebulizer treatments, try to titrate off oxygen. Counseled to quit smoking.  3. Essential hypertension. On Norvasc, clonidine and ACE inhibitor.  4. Dyslipidemia. Continue  home dose statin.  5.Was paged by the nurse at 11:40 AM about 2 hours after I saw the patient, the patient was sitting up in a chair and had an episode where he thought he passed out for a few seconds, he was playing a game on his cell phone when this happened. He then called the nurse who evaluated the patient with stable vital signs, he is now back to his usual self.  There is  no incontinence or tongue bite, there is no previous history of seizures, no history seizures to her CVA. We'll have him ambulate in the hallway, check orthostatics, EEG, MRI brain. Neuro eval. Place on 81 of aspirin.   Code Status : Full  Family Communication  :  Wife bedside  Disposition Plan  :  Home in am  Consults  :     Procedures  :     DVT Prophylaxis  :  Lovenox started   Lab Results  Component Value Date   PLT 313 01/19/2016    Inpatient Medications  Scheduled Meds: . amLODipine  10 mg Oral QHS  . atorvastatin  20 mg Oral q1800  . azithromycin  250 mg Oral Daily  . cefTRIAXone (ROCEPHIN)  IV  1 g Intravenous Q24H  . cloNIDine  0.1 mg Oral BID  . dextromethorphan  30 mg Oral BID  . Fluticasone-Salmeterol  1 puff Inhalation BID  . ipratropium-albuterol  3 mL Nebulization QID  . lisinopril  10 mg Oral QHS  . nicotine  14 mg Transdermal Daily  . predniSONE  50 mg Oral Q breakfast  . tiotropium  18 mcg Inhalation Daily   Continuous Infusions:  PRN Meds:.albuterol, benzonatate, calcium carbonate, ibuprofen  Antibiotics  :    Anti-infectives    Start     Dose/Rate Route Frequency Ordered Stop   01/20/16 1000  azithromycin (ZITHROMAX) tablet 250 mg     250 mg Oral Daily 01/19/16 2007 01/24/16 0959   01/19/16 2200  doxycycline (VIBRA-TABS) tablet 100 mg  Status:  Discontinued     100 mg Oral 2 times daily 01/19/16 1957 01/19/16 2006   01/19/16 2200  cefTRIAXone (ROCEPHIN) 1 g in dextrose 5 % 50 mL IVPB     1 g 100 mL/hr over 30 Minutes Intravenous Every 24 hours 01/19/16 2007      01/19/16 2200  azithromycin (ZITHROMAX) tablet 500 mg     500 mg Oral Daily 01/19/16 2007 01/19/16 2148         Objective:   Filed Vitals:   01/20/16 2002 01/20/16 2201 01/21/16 0025 01/21/16 0524  BP:  151/87  120/84  Pulse: 80 80 85 76  Temp:  98.2 F (36.8 C)  97.6 F (36.4 C)  TempSrc:      Resp: 20 18 20 18   Height:      Weight:      SpO2: 98% 99% 98% 99%    Wt Readings from Last 3 Encounters:  01/19/16 81.647 kg (180 lb)  01/17/16 81.647 kg (180 lb)  09/25/15 84.369 kg (186 lb)     Intake/Output Summary (Last 24 hours) at 01/21/16 0937 Last data filed at 01/20/16 1922  Gross per 24 hour  Intake    480 ml  Output      0 ml  Net    480 ml     Physical Exam  Awake Alert, Oriented X 3, No new F.N deficits, Normal affect [place].AT,PERRAL Supple Neck,No JVD, No cervical lymphadenopathy appriciated.  Symmetrical Chest wall movement, Mod air movement bilaterally, Bilateral expiratory wheezing RRR,No Gallops,Rubs or new Murmurs, No Parasternal Heave +ve B.Sounds, Abd Soft, No tenderness, No organomegaly appriciated, No rebound - guarding or rigidity. No Cyanosis, Clubbing or edema, No new Rash or bruise       Data Review:    CBC  Recent Labs Lab 01/19/16 1110  WBC 11.3*  HGB 13.7  HCT 40.9  PLT 313  MCV 88.0  MCH 29.5  MCHC 33.5  RDW 13.7  LYMPHSABS 1.9  MONOABS 1.1*  EOSABS 0.1  BASOSABS 0.0    Chemistries   Recent Labs Lab 01/19/16 1110 01/20/16 0520  NA 134* 134*  K 4.1 4.5  CL 104 102  CO2 21* 23  GLUCOSE 112* 127*  BUN 24* 25*  CREATININE 1.33* 1.32*  CALCIUM 9.4 9.4  AST 27  --   ALT 25  --   ALKPHOS 60  --   BILITOT 0.5  --    ------------------------------------------------------------------------------------------------------------------ No results for input(s): CHOL, HDL, LDLCALC, TRIG, CHOLHDL, LDLDIRECT in the last 72 hours.  Lab Results  Component Value Date   HGBA1C 5.9 01/06/2015    ------------------------------------------------------------------------------------------------------------------ No results for input(s): TSH, T4TOTAL, T3FREE, THYROIDAB in the last 72 hours.  Invalid input(s): FREET3 ------------------------------------------------------------------------------------------------------------------ No results for input(s): VITAMINB12, FOLATE, FERRITIN, TIBC, IRON, RETICCTPCT in the last 72 hours.  Coagulation profile No results for input(s): INR, PROTIME in the last 168 hours.  No results for input(s): DDIMER in the last 72 hours.  Cardiac Enzymes No results for input(s): CKMB, TROPONINI, MYOGLOBIN in the last 168 hours.  Invalid input(s): CK ------------------------------------------------------------------------------------------------------------------ No results found for: BNP  Micro Results Recent Results (from the past 240 hour(s))  Culture, blood (routine x 2) Call MD if unable to obtain prior to antibiotics being given     Status: None (Preliminary result)   Collection Time: 01/19/16  8:39 PM  Result Value Ref Range Status   Specimen Description BLOOD LEFT HAND  Final   Special Requests BOTTLES DRAWN AEROBIC AND ANAEROBIC 5CC  Final   Culture NO GROWTH < 24 HOURS  Final   Report Status PENDING  Incomplete  Culture, blood (routine x 2) Call MD if unable to obtain prior to antibiotics being given     Status: None (Preliminary result)   Collection Time: 01/19/16  8:48 PM  Result Value Ref Range Status   Specimen Description BLOOD RIGHT ANTECUBITAL  Final   Special Requests BOTTLES DRAWN AEROBIC AND ANAEROBIC 7CC  Final   Culture NO GROWTH < 24 HOURS  Final   Report Status PENDING  Incomplete  Culture, sputum-assessment     Status: None   Collection Time: 01/20/16 12:55 AM  Result Value Ref Range Status   Specimen Description EXPECTORATED SPUTUM  Final   Special Requests NONE  Final   Sputum evaluation   Final    THIS SPECIMEN IS  ACCEPTABLE. RESPIRATORY CULTURE REPORT TO FOLLOW.   Report Status 01/20/2016 FINAL  Final  Culture, respiratory (NON-Expectorated)     Status: None (Preliminary result)   Collection Time: 01/20/16 12:55 AM  Result Value Ref Range Status   Specimen Description EXPECTORATED SPUTUM  Final   Special Requests NONE  Final   Gram Stain   Final    RARE SQUAMOUS EPITHELIAL CELLS PRESENT RARE WBC PRESENT, PREDOMINANTLY MONONUCLEAR FEW GRAM POSITIVE COCCI IN PAIRS FEW GRAM POSITIVE RODS    Culture PENDING  Incomplete   Report Status PENDING  Incomplete    Radiology Reports Dg Chest 2 View  01/21/2016  CLINICAL DATA:  Shortness of breath. EXAM: CHEST  2 VIEW COMPARISON:  Radiograph of January 19, 2016. FINDINGS: The heart size and mediastinal contours are within normal limits. No pneumothorax or pleural effusion is noted. Hyperexpansion of the lungs is noted. Right upper lobe opacity is slightly improved consistent with residual inflammation or subsegmental atelectasis. Left lung is clear. The visualized skeletal structures are unremarkable. IMPRESSION: Slightly decreased right upper lobe  opacity is noted suggesting improving pneumonia or subsegmental atelectasis. Continued radiographic follow-up is recommended to ensure resolution or stability. Hyperexpansion of the lungs is noted suggesting chronic obstructive pulmonary disease. Electronically Signed   By: Lupita Raider, M.D.   On: 01/21/2016 09:10   Dg Chest 2 View  01/19/2016  CLINICAL DATA:  Cough for 5-6 days.  Shortness of breath. EXAM: CHEST  2 VIEW COMPARISON:  CT chest 09/15/2015.  PA and lateral chest 01/17/2016. FINDINGS: The lungs are severely emphysematous. Small focus of nodular opacity in the right upper lobe is unchanged. No pneumothorax or pleural effusion. Heart size is normal. IMPRESSION: No acute disease. Severe emphysema.  No change in a nodular opacity. Electronically Signed   By: Drusilla Kanner M.D.   On: 01/19/2016 11:38   Dg  Chest 2 View  01/17/2016  CLINICAL DATA:  Shortness of breath and cough for 3 days EXAM: CHEST  2 VIEW COMPARISON:  Chest radiograph Dec 06, 2013 and chest CT September 15, 2015 FINDINGS: There is a degree of underlying emphysematous change. There is patchy infiltrate in the right mid lung. There are scattered areas of mild scarring bilaterally. Heart size is normal. Pulmonary vascularity reflects the underlying emphysematous change and is stable. No adenopathy is evident. No bone lesions. IMPRESSION: Focal area of airspace disease, consistent with pneumonia, in the right mid lung. Underlying emphysematous change. Stable cardiac silhouette. Followup PA and lateral chest radiographs recommended in 3-4 weeks following trial of antibiotic therapy to ensure resolution and exclude underlying malignancy. Electronically Signed   By: Bretta Bang III M.D.   On: 01/17/2016 09:29    Time Spent in minutes  30   SINGH,PRASHANT K M.D on 01/21/2016 at 9:37 AM  Between 7am to 7pm - Pager - [phone]  After 7pm go to [email or web address] - password [facility]  [facility] -  Office  [phone]

## 2016-01-21 NOTE — Progress Notes (Signed)
Patient called RN to stated that he passed out for a sec and when he came to his left arm had a mild tremor.  When he woke up, he was a little disoriented for a few seconds and looked on the floor and found his phone and remembered he was playing a game on this phone when this happen.  VS taken and are stable. He sating 96% on RA. Patient assisted back to bed and MD called to make aware. Patient completely alert and oriented and back to his base line.

## 2016-01-21 NOTE — Progress Notes (Signed)
Patient finishing breathing treatment  And he started having and episode of aggressive coughing and never went unresponsive but had a blank stare for approximately 3 sec. Wife who is a Engineer, civil (consulting) and respiratory therapist was present at the bedside during episode. Vital taken and MD phoned. Orders for stat EEG given and neurology is consulted.

## 2016-01-21 NOTE — Consult Note (Signed)
NEURO HOSPITALIST CONSULT NOTE   Requestig physician: Dr. Thedore Mins  Reason for Consult: LOC followed by tremor on awakening  History obtained from:  Patient and Chart    HPI:                                                                                                                                          Derek Conner is an 55 y.o. male who initially presented for evaluation of an ongoing cough and SOB. He was diagnosed with CAP, started on levaquin and prednisone, but without improvement, so he returned to the ED and was admitted on 6/16.   He was treated this admission for the CAP with mild COPD exacerbation. Clinically he had much improved and chest x-ray also was much improved. Was treated this admission with azithromycin and Rocephin, as he had failed outpatient Levaquin. He was counseled to quit smoking.  He was being prepared for discharge today when the primary MD was alerted to a passing out spell: "was paged by the nurse at 11:40 AM about 2 hours after I saw the patient, the patient was sitting up in a chair and had an episode where he thought he passed out for a few seconds, he was playing a game on his cell phone when this happened. He then called the nurse who evaluated the patient with stable vital signs, he is now back to his usual self." Patient also stated after the episode that he noticed that his left arm had a mild tremor when he came to, and also was somewhat disoriented for a few seconds.   Neurology was consulted to assess for seizure versus syncope.     Past Medical History  Diagnosis Date  . COPD (chronic obstructive pulmonary disease) (HCC)   . Emphysema lung (HCC)   . Hypertension   . Emphysema of lung (HCC)   . High cholesterol   . Asthma   . CAP (community acquired pneumonia) 01/19/2016  . Pneumonia 1960s  . Chronic bronchitis (HCC)   . Hepatitis A infection ~ 1980  . Rheumatoid arthritis (HCC)     "hands mostly"  ( 01/19/2016)   . Situational depression Dec 07, 2009    "only when my mother died"    Past Surgical History  Procedure Laterality Date  . Foot fracture surgery Left ~ 12/08/2003    "it was crushed"  . Cystectomy Left 1960's    "wrist"  . Chest tube insertion Left 1990's    "lung collapsed"  . Video bronchoscopy Bilateral 12/06/2013    Procedure: VIDEO BRONCHOSCOPY WITHOUT FLUORO;  Surgeon: Alyson Reedy, MD;  Location: [facility] ENDOSCOPY;  Service: Cardiopulmonary;  Laterality: Bilateral;  . Fracture surgery    . Appendectomy  1980's    Family History  Problem Relation Age  of Onset  . Cancer Mother   . Arthritis Mother   . Stroke Father   . Hypertension Father   . Colon cancer Neg Hx     Social History:  reports that he has been smoking Cigarettes.  He has a 40 pack-year smoking history. He has never used smokeless tobacco. He reports that he drinks about 8.4 oz of alcohol per week. He reports that he uses illicit drugs (Marijuana).  Allergies  Allergen Reactions  . Symbicort [Budesonide-Formoterol Fumarate] Shortness Of Breath, Nausea And Vomiting and Other (See Comments)    heachache  . Dulera [Mometasone Furo-Formoterol Fum]     Headache    MEDICATIONS:                                                                                                                      Current facility-administered medications:  .  albuterol (PROVENTIL) (2.5 MG/3ML) 0.083% nebulizer solution 2.5 mg, 2.5 mg, Nebulization, Q2H PRN, Haydee Salter, MD, 2.5 mg at 01/21/16 1007 .  amLODipine (NORVASC) tablet 10 mg, 10 mg, Oral, QHS, Ozella Rocks, MD, 10 mg at 01/20/16 2242 .  aspirin chewable tablet 81 mg, 81 mg, Oral, Daily, Leroy Sea, MD, 81 mg at 01/21/16 1336 .  atorvastatin (LIPITOR) tablet 20 mg, 20 mg, Oral, q1800, Hillary Bow, DO, 20 mg at 01/21/16 6384 .  [COMPLETED] azithromycin (ZITHROMAX) tablet 500 mg, 500 mg, Oral, Daily, 500 mg at 01/19/16 2148 **FOLLOWED BY** azithromycin (ZITHROMAX) tablet 250  mg, 250 mg, Oral, Daily, Hillary Bow, DO, 250 mg at 01/21/16 1008 .  benzonatate (TESSALON) capsule 200 mg, 200 mg, Oral, TID PRN, Hillary Bow, DO, 200 mg at 01/20/16 5364 .  calcium carbonate (TUMS - dosed in mg elemental calcium) chewable tablet 200 mg of elemental calcium, 1 tablet, Oral, BID PRN, Rolan Lipa, NP, 200 mg of elemental calcium at 01/20/16 2040 .  cefTRIAXone (ROCEPHIN) 1 g in dextrose 5 % 50 mL IVPB, 1 g, Intravenous, Q24H, Hillary Bow, DO, 1 g at 01/20/16 2242 .  cloNIDine (CATAPRES) tablet 0.1 mg, 0.1 mg, Oral, BID, Hillary Bow, DO, 0.1 mg at 01/21/16 1008 .  dextromethorphan (DELSYM) 30 MG/5ML liquid 30 mg, 30 mg, Oral, BID, Haydee Salter, MD, 30 mg at 01/21/16 1008 .  enoxaparin (LOVENOX) injection 40 mg, 40 mg, Subcutaneous, Q24H, Annamary Carolin, RPH, 40 mg at 01/21/16 1336 .  Fluticasone-Salmeterol (ADVAIR) 500-50 MCG/DOSE inhaler 1 puff, 1 puff, Inhalation, BID, Ozella Rocks, MD, 1 puff at 01/21/16 0739 .  ibuprofen (ADVIL,MOTRIN) tablet 200 mg, 200 mg, Oral, Q6H PRN, Hillary Bow, DO .  ipratropium-albuterol (DUONEB) 0.5-2.5 (3) MG/3ML nebulizer solution 3 mL, 3 mL, Nebulization, QID, Haydee Salter, MD, 3 mL at 01/21/16 1615 .  lisinopril (PRINIVIL,ZESTRIL) tablet 10 mg, 10 mg, Oral, QHS, Ozella Rocks, MD, 10 mg at 01/20/16 2241 .  nicotine (NICODERM CQ - dosed in mg/24 hours) patch 14 mg, 14 mg,  Transdermal, Daily, Hillary Bow, DO, 14 mg at 01/19/16 2000 .  predniSONE (DELTASONE) tablet 50 mg, 50 mg, Oral, Q breakfast, Jared M Gardner, DO, 50 mg at 01/21/16 1008 .  tiotropium (SPIRIVA) inhalation capsule 18 mcg, 18 mcg, Inhalation, Daily, Hillary Bow, DO, 18 mcg at 01/21/16 0734  ROS:                                                                                                                                       History obtained from patient. As per HPI.   Blood pressure 133/77, pulse 73, temperature 97.8 F (36.6  C), temperature source Oral, resp. rate 20, height 6' (1.829 m), weight 81.647 kg (180 lb), SpO2 96 %.   Neurologic Examination:                                                                                                      HEENT-  Normocephalic, no lesions, without obvious abnormality. . Lungs/Thorax - "barrel chest" noted Extremities- No edema   Neurological Examination Mental Status: Alert, oriented, thought content appropriate.  Speech fluent without evidence of aphasia.  Able to follow 3 step commands without difficulty. Cranial Nerves: II: Visual fields grossly normal, pupils equal, round, reactive to light  III,IV, VI: ptosis not present, extra-ocular motions intact bilaterally V,VII: smile symmetric, facial temperature sensation normal bilaterally VIII: hearing intact to conversation IX,X: no hypophonia noted XI: bilateral shoulder shrug equal XII: midline tongue extension Motor: Right : Upper extremity   5/5    Left:     Upper extremity   5/5  Lower extremity   5/5     Lower extremity   5/5 Tone and bulk:normal tone throughout; no atrophy noted Sensory: Temperature and light touch intact throughout, bilaterally. No extinction. Deep Tendon Reflexes: 2+ and symmetric throughout Cerebellar: No ataxia on FNF bilaterally Gait: Deferred  Lab Results: Basic Metabolic Panel:  Recent Labs Lab 01/19/16 1110 01/20/16 0520  NA 134* 134*  K 4.1 4.5  CL 104 102  CO2 21* 23  GLUCOSE 112* 127*  BUN 24* 25*  CREATININE 1.33* 1.32*  CALCIUM 9.4 9.4    Liver Function Tests:  Recent Labs Lab 01/19/16 1110  AST 27  ALT 25  ALKPHOS 60  BILITOT 0.5  PROT 7.5  ALBUMIN 4.1   No results for input(s): LIPASE, AMYLASE in the last 168 hours. No results for input(s): AMMONIA in the last 168 hours.  CBC:  Recent Labs Lab 01/19/16  1110  WBC 11.3*  NEUTROABS 8.2*  HGB 13.7  HCT 40.9  MCV 88.0  PLT 313    Cardiac Enzymes: No results for input(s): CKTOTAL, CKMB,  CKMBINDEX, TROPONINI in the last 168 hours.  Lipid Panel: No results for input(s): CHOL, TRIG, HDL, CHOLHDL, VLDL, LDLCALC in the last 168 hours.  CBG: No results for input(s): GLUCAP in the last 168 hours.  Microbiology: Results for orders placed or performed during the hospital encounter of 01/19/16  Culture, blood (routine x 2) Call MD if unable to obtain prior to antibiotics being given     Status: None (Preliminary result)   Collection Time: 01/19/16  8:39 PM  Result Value Ref Range Status   Specimen Description BLOOD LEFT HAND  Final   Special Requests BOTTLES DRAWN AEROBIC AND ANAEROBIC 5CC  Final   Culture NO GROWTH < 24 HOURS  Final   Report Status PENDING  Incomplete  Culture, blood (routine x 2) Call MD if unable to obtain prior to antibiotics being given     Status: None (Preliminary result)   Collection Time: 01/19/16  8:48 PM  Result Value Ref Range Status   Specimen Description BLOOD RIGHT ANTECUBITAL  Final   Special Requests BOTTLES DRAWN AEROBIC AND ANAEROBIC 7CC  Final   Culture NO GROWTH < 24 HOURS  Final   Report Status PENDING  Incomplete  Culture, sputum-assessment     Status: None   Collection Time: 01/20/16 12:55 AM  Result Value Ref Range Status   Specimen Description EXPECTORATED SPUTUM  Final   Special Requests NONE  Final   Sputum evaluation   Final    THIS SPECIMEN IS ACCEPTABLE. RESPIRATORY CULTURE REPORT TO FOLLOW.   Report Status 01/20/2016 FINAL  Final  Culture, respiratory (NON-Expectorated)     Status: None (Preliminary result)   Collection Time: 01/20/16 12:55 AM  Result Value Ref Range Status   Specimen Description EXPECTORATED SPUTUM  Final   Special Requests NONE  Final   Gram Stain   Final    RARE SQUAMOUS EPITHELIAL CELLS PRESENT RARE WBC PRESENT, PREDOMINANTLY MONONUCLEAR FEW GRAM POSITIVE COCCI IN PAIRS FEW GRAM POSITIVE RODS    Culture CULTURE REINCUBATED FOR BETTER GROWTH  Final   Report Status PENDING  Incomplete     Coagulation Studies: No results for input(s): LABPROT, INR in the last 72 hours.  Imaging: Dg Chest 2 View  01/21/2016  CLINICAL DATA:  Shortness of breath. EXAM: CHEST  2 VIEW COMPARISON:  Radiograph of January 19, 2016. FINDINGS: The heart size and mediastinal contours are within normal limits. No pneumothorax or pleural effusion is noted. Hyperexpansion of the lungs is noted. Right upper lobe opacity is slightly improved consistent with residual inflammation or subsegmental atelectasis. Left lung is clear. The visualized skeletal structures are unremarkable. IMPRESSION: Slightly decreased right upper lobe opacity is noted suggesting improving pneumonia or subsegmental atelectasis. Continued radiographic follow-up is recommended to ensure resolution or stability. Hyperexpansion of the lungs is noted suggesting chronic obstructive pulmonary disease. Electronically Signed   By: Lupita Raider, M.D.   On: 01/21/2016 09:10   Mr Brain Wo Contrast  01/21/2016  CLINICAL DATA:  Cough syncope earlier today.  Pneumonia. EXAM: MRI HEAD WITHOUT CONTRAST TECHNIQUE: Multiplanar, multiecho pulse sequences of the brain and surrounding structures were obtained without intravenous contrast. COMPARISON:  None. FINDINGS: No evidence for acute infarction, hemorrhage, mass lesion, hydrocephalus, or extra-axial fluid. Premature for age cerebral and cerebellar atrophy. Minor white matter disease, nonspecific. Unremarkable pituitary  and cerebellar tonsils. Mild cervical spondylosis. Flow voids are maintained throughout.  No chronic hemorrhage. In the RIGHT sylvian fissure, there is an ovoid vascular structure measuring 6 x 6 x 8 mm arising from the RIGHT MCA bifurcation or trifurcation consistent with an unruptured saccular aneurysm. Within limits for detection on routine MR brain, no other saccular aneurysms are suspected. No surrounding blood products to suggest recent hemorrhage, although noncontrast CT is more sensitive.  Minor layering fluid in the RIGHT maxillary sinus suggesting acuity. Moderate mucosal thickening in the BILATERAL ethmoid air cells. No significant frontal or sphenoid sinus fluid. Negative orbits and temporal bones. Visualized scalp soft tissues and salivary glands unremarkable. IMPRESSION: Premature for age atrophy.  No acute stroke. 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 Further characterization with CTA head is warranted. These results will be called to the ordering clinician or representative by the Radiologist Assistant, and communication documented in the PACS or zVision Dashboard. Electronically Signed   By: Elsie Stain M.D.   On: 01/21/2016 13:56    Assessment: 1. Spell of LOC. DDx includes epileptic seizure and syncope (orthostatic, cardiac arrhythmia, vertebrobasilar insufficiency, tussive syncope).  2. 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 3. CAP with COPD exacerbation. On antibiotics and corticosteroid.   Recommendations: 1. CTA of head and neck to further assess the right MCA bifurcation aneurysm seen on MRI, as well as to assess for possible vertebrobasilar insufficiency.  2. Agree with obtaining EEG.  3. Serum magnesium level.  4. Antitussives.   01/21/2016, 2:27 PM

## 2016-01-22 ENCOUNTER — Inpatient Hospital Stay (HOSPITAL_COMMUNITY): Payer: 59

## 2016-01-22 DIAGNOSIS — R55 Syncope and collapse: Secondary | ICD-10-CM

## 2016-01-22 DIAGNOSIS — I729 Aneurysm of unspecified site: Secondary | ICD-10-CM

## 2016-01-22 MED ORDER — IOPAMIDOL (ISOVUE-370) INJECTION 76%
50.0000 mL | Freq: Once | INTRAVENOUS | Status: AC | PRN
  Administered 2016-01-22: 50 mL via INTRAVENOUS

## 2016-01-22 MED FILL — SPIRIVA 18 MCG CP-HANDIHALE: 18 | 30 days supply | Qty: 30 | Fill #0

## 2016-01-22 NOTE — Progress Notes (Addendum)
RT instructed pt with IS - pt tol well. Pt goal reached 1250 X 10 with good effort. Pt can do independently. Pt states he did flutter X 10 independently - PC small white.

## 2016-01-22 NOTE — Procedures (Signed)
HPI:  55 y/o with syncopal episode  TECHNICAL SUMMARY:  A multichannel referential and bipolar montage EEG using the standard international 10-20 system was performed on the patient described as awake.  The dominant background activity consists of 10 hertz activity seen most prominantly over the posterior head region.  The backgound activity is reactive to eye opening and closing procedures.  Low voltage fast (beta) activity is distributed symmetrically and maximally over the anterior head regions.  ACTIVATION:  Stepwise photic stimulation at 4-20 flashes per second was performed and did not elicit any abnormal waveforms.  Hyperventilation was not performed.  EPILEPTIFORM ACTIVITY:  There were no spikes, sharp waves or paroxysmal activity.  SLEEP:  No sleep  CARDIAC:  The EKG lead revealed a regular rhythm.  IMPRESSION:  This is a normal EEG for the patients stated age.  There were no focal, hemispheric or lateralizing features.  No epileptiform activity was recorded.  A normal EEG does not exclude the diagnosis of a seizure disorder and if seizure remains high on the list of differential diagnosis, an ambulatory EEG may be of value.  Clinical correlation is required.

## 2016-01-22 NOTE — Progress Notes (Signed)
EEG completed; results pending.    

## 2016-01-22 NOTE — Progress Notes (Signed)
Subjective: He and his wife reports having another episode of Lt arm shaking and becoming less responsive for several seconds during a nebulizer treatment yesterday afternoon.  His wife reports he was immediately responsive afterward.  He was on telemetry at that time and strip review does not show any episode of bradycardia.  His oxygenation was not being monitored during any of the three episodes.  Exam: Filed Vitals:   01/22/16 0411 01/22/16 0412  BP: 134/77 100/60  Pulse: 80 99  Temp:    Resp:      HEENT-  Normocephalic, no lesions, without obvious abnormality.  Normal external eye and conjunctiva.  Normal external ears. Normal external nose.  Normal pharynx. Cardiovascular- S1, S2 normal, pulses palpable throughout   Lungs- breath sounds clear, diminished bases,no tachypnea, retractions or cyanosis Abdomen- soft, non-tender; bowel sounds normal; no masses,  no organomegaly Extremities- no joint deformities, effusion, or inflammation and no edema Lymph-no adenopathy palpable Musculoskeletal-no joint tenderness, deformity or swelling Skin-warm and dry, no hyperpigmentation, vitiligo, or suspicious lesions    Gen: In recliner, NAD MS: Alert, oriented, speech fluent without evidence of aphasia.  Able to follow 3 step commands CN: II-XII intact Motor: Upper extremity 5/5Left: Upper extremity 5/5 Lower extremity 5/5Lower extremity 5/5 Sensory: Temperature and light touch intact bilaterally DTR: 2+ and symmetric   Pertinent Labs/Diagnostics: EEG results: This is a normal EEG for the patients stated age. There were no focal, hemispheric or lateralizing features. No epileptiform activity was recorded. A normal EEG does not exclude the diagnosis of a seizure disorder and if seizure remains high on the list of differential diagnosis, an ambulatory EEG may be of value.  Clinical correlation is required.   April Pugh, MSN, RN Neurology [phone]  Wife reports no new episodes of syncope since yesterday. According the chart, he does currently smoke.  I have seen and examined the patient. Mr Mckillop has had several episodes of extremely brief loss of consciousness associated with jerking. He has normal exam with normal strength, normal mentation, no ataxia on finger-nose-finger, intact to light touch sensory, symmetric face, extraocular movements intact, pupils equal round and reactive, BP 134/77, pulse 80, temperature 97.8.   Impression: 55 yo wm with three episodes of syncope which could be related to hypoxia, decreased venous return due to increased intrathoracic pressure with coughing, they going with cough.  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 He had at least one episode that was more focal, but I do not think that this changes of the most likely diagnosis is syncope.   Recommendations: 1) I will request consultation from Dr. Corliss Skains with regard to his aneurysm 2) CT angiogram of the head and neck to evaluate for vertebrobasilar insufficiency 3) no antiepileptics at this time.  Ritta Slot, MD [facility] [phone]  If 7pm- 7am, please page neurology on call as listed in AMION.  01/22/2016, 12:19 PM

## 2016-01-22 NOTE — Progress Notes (Signed)
PROGRESS NOTE                                                                                                                                                                                                             Patient Demographics:    Derek Conner, is a 55 y.o. male, DOB - 03-28-1961, JKK:[id number]  Admit date - 01/19/2016   Admitting Physician Ozella Rocks, MD  Outpatient Primary MD for the patient is Saguier, Kateri Mc  LOS - 3  Chief Complaint  Patient presents with  . Shortness of Breath       Brief Narrative   [name] is a 55 y.o. male with medical history significant of COPD, patient presents to the ED with c/o ongoing cough, SOB. Nothing makes symptoms better or worse. Symptoms ongoing for several days now, got CXR 2 days ago and found to have RML nodular opacity diagnosed as CAP. Started on levaquin and prednisone, no improvement since that time so returns to ED.  ED Course: Had episode of cough syncope while in ED. Also has new O2 requirement. Started on treatment for COPD and transferred to [facility].   Subjective:    [name] today has, No headache, No chest pain, No abdominal pain - No Nausea, No new weakness tingling or numbness, Improved Cough - SOB.     Assessment  & Plan :    1.Community-acquired pneumonia with mild COPD exacerbation. Clinically much improved, chest x-ray much improved, we'll try to titrate off oxygen and increase activity, follow cultures, currently on azithromycin and Rocephin, failed outpatient Levaquin. Continue supportive care. Counseled to quit smoking.  2. COPD exacerbation mild with ongoing smoking. On oral steroids, antibiotics as above, supportive care with oxygen and nebulizer treatments, try to titrate off oxygen. Counseled to quit smoking.  3. Essential hypertension. On Norvasc, clonidine and ACE inhibitor.  4. Dyslipidemia. Continue  home dose statin.  5. Episodes of posttussive syncope. Seen by neuro, EEG pending, echo pending, MRI shows a incidental aneurysm, per neuro no further workup. Patient and wife educated in detail bedside.  6. Sedentary finding of brain aneurysm. Outpatient follow-up with interventional neurologist Dr. Corliss Skains.   Code Status : Full  Family Communication  :  Wife bedside  Disposition Plan  :  Home in am or later today  Consults  :  Neuro  Procedures  :    MRI head - incidental aneurysm noted, nonspecific atrophy.  Echogram  EEG   DVT Prophylaxis  :  Lovenox started   Lab Results  Component Value Date   PLT 313 01/19/2016    Inpatient Medications  Scheduled Meds: . amLODipine  10 mg Oral QHS  . aspirin  81 mg Oral Daily  . atorvastatin  20 mg Oral q1800  . azithromycin  250 mg Oral Daily  . cefTRIAXone (ROCEPHIN)  IV  1 g Intravenous Q24H  . cloNIDine  0.1 mg Oral BID  . dextromethorphan  30 mg Oral BID  . enoxaparin (LOVENOX) injection  40 mg Subcutaneous Q24H  . Fluticasone-Salmeterol  1 puff Inhalation BID  . ipratropium-albuterol  3 mL Nebulization QID  . lisinopril  10 mg Oral QHS  . nicotine  14 mg Transdermal Daily  . predniSONE  50 mg Oral Q breakfast  . tiotropium  18 mcg Inhalation Daily   Continuous Infusions:  PRN Meds:.albuterol, benzonatate, calcium carbonate, ibuprofen  Antibiotics  :    Anti-infectives    Start     Dose/Rate Route Frequency Ordered Stop   01/20/16 1000  azithromycin (ZITHROMAX) tablet 250 mg     250 mg Oral Daily 01/19/16 2007 01/24/16 0959   01/19/16 2200  doxycycline (VIBRA-TABS) tablet 100 mg  Status:  Discontinued     100 mg Oral 2 times daily 01/19/16 1957 01/19/16 2006   01/19/16 2200  cefTRIAXone (ROCEPHIN) 1 g in dextrose 5 % 50 mL IVPB     1 g 100 mL/hr over 30 Minutes Intravenous Every 24 hours 01/19/16 2007     01/19/16 2200  azithromycin (ZITHROMAX) tablet 500 mg     500 mg Oral Daily 01/19/16 2007 01/19/16  2148         Objective:   Filed Vitals:   01/22/16 0409 01/22/16 0411 01/22/16 0412 01/22/16 0835  BP: 141/80 134/77 100/60   Pulse: 77 80 99   Temp: 97.8 F (36.6 C)     TempSrc:      Resp: 18     Height:      Weight:      SpO2: 97% 96% 95% 96%    Wt Readings from Last 3 Encounters:  01/19/16 81.647 kg (180 lb)  01/17/16 81.647 kg (180 lb)  09/25/15 84.369 kg (186 lb)     Intake/Output Summary (Last 24 hours) at 01/22/16 0950 Last data filed at 01/21/16 1300  Gross per 24 hour  Intake    240 ml  Output      0 ml  Net    240 ml     Physical Exam  Awake Alert, Oriented X 3, No new F.N deficits, Normal affect [place].AT,PERRAL Supple Neck,No JVD, No cervical lymphadenopathy appriciated.  Symmetrical Chest wall movement, Mod air movement bilaterally, Bilateral expiratory wheezing RRR,No Gallops,Rubs or new Murmurs, No Parasternal Heave +ve B.Sounds, Abd Soft, No tenderness, No organomegaly appriciated, No rebound - guarding or rigidity. No Cyanosis, Clubbing or edema, No new Rash or bruise       Data Review:    CBC  Recent Labs Lab 01/19/16 1110  WBC 11.3*  HGB 13.7  HCT 40.9  PLT 313  MCV 88.0  MCH 29.5  MCHC 33.5  RDW 13.7  LYMPHSABS 1.9  MONOABS 1.1*  EOSABS 0.1  BASOSABS 0.0    Chemistries   Recent Labs Lab 01/19/16 1110 01/20/16 0520  NA 134* 134*  K 4.1 4.5  CL 104 102  CO2 21* 23  GLUCOSE 112* 127*  BUN 24* 25*  CREATININE 1.33* 1.32*  CALCIUM 9.4 9.4  AST 27  --   ALT 25  --   ALKPHOS 60  --   BILITOT 0.5  --    ------------------------------------------------------------------------------------------------------------------ No results for input(s): CHOL, HDL, LDLCALC, TRIG, CHOLHDL, LDLDIRECT in the last 72 hours.  Lab Results  Component Value Date   HGBA1C 5.9 01/06/2015   ------------------------------------------------------------------------------------------------------------------ No results for input(s): TSH,  T4TOTAL, T3FREE, THYROIDAB in the last 72 hours.  Invalid input(s): FREET3 ------------------------------------------------------------------------------------------------------------------ No results for input(s): VITAMINB12, FOLATE, FERRITIN, TIBC, IRON, RETICCTPCT in the last 72 hours.  Coagulation profile No results for input(s): INR, PROTIME in the last 168 hours.  No results for input(s): DDIMER in the last 72 hours.  Cardiac Enzymes No results for input(s): CKMB, TROPONINI, MYOGLOBIN in the last 168 hours.  Invalid input(s): CK ------------------------------------------------------------------------------------------------------------------ No results found for: BNP  Micro Results Recent Results (from the past 240 hour(s))  Culture, blood (routine x 2) Call MD if unable to obtain prior to antibiotics being given     Status: None (Preliminary result)   Collection Time: 01/19/16  8:39 PM  Result Value Ref Range Status   Specimen Description BLOOD LEFT HAND  Final   Special Requests BOTTLES DRAWN AEROBIC AND ANAEROBIC 5CC  Final   Culture NO GROWTH 2 DAYS  Final   Report Status PENDING  Incomplete  Culture, blood (routine x 2) Call MD if unable to obtain prior to antibiotics being given     Status: None (Preliminary result)   Collection Time: 01/19/16  8:48 PM  Result Value Ref Range Status   Specimen Description BLOOD RIGHT ANTECUBITAL  Final   Special Requests BOTTLES DRAWN AEROBIC AND ANAEROBIC 7CC  Final   Culture NO GROWTH 2 DAYS  Final   Report Status PENDING  Incomplete  Culture, sputum-assessment     Status: None   Collection Time: 01/20/16 12:55 AM  Result Value Ref Range Status   Specimen Description EXPECTORATED SPUTUM  Final   Special Requests NONE  Final   Sputum evaluation   Final    THIS SPECIMEN IS ACCEPTABLE. RESPIRATORY CULTURE REPORT TO FOLLOW.   Report Status 01/20/2016 FINAL  Final  Culture, respiratory (NON-Expectorated)     Status: None  (Preliminary result)   Collection Time: 01/20/16 12:55 AM  Result Value Ref Range Status   Specimen Description EXPECTORATED SPUTUM  Final   Special Requests NONE  Final   Gram Stain   Final    RARE SQUAMOUS EPITHELIAL CELLS PRESENT RARE WBC PRESENT, PREDOMINANTLY MONONUCLEAR FEW GRAM POSITIVE COCCI IN PAIRS FEW GRAM POSITIVE RODS    Culture CULTURE REINCUBATED FOR BETTER GROWTH  Final   Report Status PENDING  Incomplete    Radiology Reports Dg Chest 2 View  01/21/2016  CLINICAL DATA:  Shortness of breath. EXAM: CHEST  2 VIEW COMPARISON:  Radiograph of January 19, 2016. FINDINGS: The heart size and mediastinal contours are within normal limits. No pneumothorax or pleural effusion is noted. Hyperexpansion of the lungs is noted. Right upper lobe opacity is slightly improved consistent with residual inflammation or subsegmental atelectasis. Left lung is clear. The visualized skeletal structures are unremarkable. IMPRESSION: Slightly decreased right upper lobe opacity is noted suggesting improving pneumonia or subsegmental atelectasis. Continued radiographic follow-up is recommended to ensure resolution or stability. Hyperexpansion of the lungs is noted suggesting chronic obstructive pulmonary disease. Electronically Signed   By: Fayrene Fearing  Christen Butter, M.D.   On: 01/21/2016 09:10   Dg Chest 2 View  01/19/2016  CLINICAL DATA:  Cough for 5-6 days.  Shortness of breath. EXAM: CHEST  2 VIEW COMPARISON:  CT chest 09/15/2015.  PA and lateral chest 01/17/2016. FINDINGS: The lungs are severely emphysematous. Small focus of nodular opacity in the right upper lobe is unchanged. No pneumothorax or pleural effusion. Heart size is normal. IMPRESSION: No acute disease. Severe emphysema.  No change in a nodular opacity. Electronically Signed   By: Drusilla Kanner M.D.   On: 01/19/2016 11:38   Dg Chest 2 View  01/17/2016  CLINICAL DATA:  Shortness of breath and cough for 3 days EXAM: CHEST  2 VIEW COMPARISON:  Chest  radiograph Dec 06, 2013 and chest CT September 15, 2015 FINDINGS: There is a degree of underlying emphysematous change. There is patchy infiltrate in the right mid lung. There are scattered areas of mild scarring bilaterally. Heart size is normal. Pulmonary vascularity reflects the underlying emphysematous change and is stable. No adenopathy is evident. No bone lesions. IMPRESSION: Focal area of airspace disease, consistent with pneumonia, in the right mid lung. Underlying emphysematous change. Stable cardiac silhouette. Followup PA and lateral chest radiographs recommended in 3-4 weeks following trial of antibiotic therapy to ensure resolution and exclude underlying malignancy. Electronically Signed   By: Bretta Bang III M.D.   On: 01/17/2016 09:29   Mr Brain Wo Contrast  01/21/2016  CLINICAL DATA:  Cough syncope earlier today.  Pneumonia. EXAM: MRI HEAD WITHOUT CONTRAST TECHNIQUE: Multiplanar, multiecho pulse sequences of the brain and surrounding structures were obtained without intravenous contrast. COMPARISON:  None. FINDINGS: No evidence for acute infarction, hemorrhage, mass lesion, hydrocephalus, or extra-axial fluid. Premature for age cerebral and cerebellar atrophy. Minor white matter disease, nonspecific. Unremarkable pituitary and cerebellar tonsils. Mild cervical spondylosis. Flow voids are maintained throughout.  No chronic hemorrhage. In the RIGHT sylvian fissure, there is an ovoid vascular structure measuring 6 x 6 x 8 mm arising from the RIGHT MCA bifurcation or trifurcation consistent with an unruptured saccular aneurysm. Within limits for detection on routine MR brain, no other saccular aneurysms are suspected. No surrounding blood products to suggest recent hemorrhage, although noncontrast CT is more sensitive. Minor layering fluid in the RIGHT maxillary sinus suggesting acuity. Moderate mucosal thickening in the BILATERAL ethmoid air cells. No significant frontal or sphenoid sinus fluid.  Negative orbits and temporal bones. Visualized scalp soft tissues and salivary glands unremarkable. IMPRESSION: Premature for age atrophy.  No acute stroke. 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 Further characterization with CTA head is warranted. These results will be called to the ordering clinician or representative by the Radiologist Assistant, and communication documented in the PACS or zVision Dashboard. Electronically Signed   By: Elsie Stain M.D.   On: 01/21/2016 13:56    Time Spent in minutes  30   Bricelyn Freestone K M.D on 01/22/2016 at 9:50 AM  Between 7am to 7pm - Pager - [phone]  After 7pm go to [email or web address] - password [facility]  [facility] -  Office  [phone]

## 2016-01-23 ENCOUNTER — Telehealth: Payer: Self-pay | Admitting: Family

## 2016-01-23 ENCOUNTER — Inpatient Hospital Stay (HOSPITAL_COMMUNITY): Payer: 59

## 2016-01-23 DIAGNOSIS — I509 Heart failure, unspecified: Secondary | ICD-10-CM

## 2016-01-23 LAB — ECHOCARDIOGRAM COMPLETE
CHL CUP MV DEC (S): 180
E decel time: 180 msec
EERAT: 5.67
HEIGHTINCHES: 72 in
LA vol A4C: 71.3 ml
LA vol index: 31.5 mL/m2
LA vol: 64.2 mL
LV E/e'average: 5.67
LV TDI E'LATERAL: 12.9
LV e' LATERAL: 12.9 cm/s
LVEEMED: 5.67
MV pk A vel: 49 m/s
MV pk E vel: 73.2 m/s
MVPG: 2 mmHg
TDI e' medial: 9.25
Weight: 2880 oz

## 2016-01-23 LAB — CULTURE, RESPIRATORY W GRAM STAIN

## 2016-01-23 LAB — CULTURE, RESPIRATORY: CULTURE: NORMAL

## 2016-01-23 MED ORDER — AZITHROMYCIN 250 MG PO TABS: 250.0000 mg | ORAL_TABLET | Freq: Every day | ORAL | Status: DC

## 2016-01-23 MED ORDER — CEFPODOXIME PROXETIL 200 MG PO TABS: 200.0000 mg | ORAL_TABLET | Freq: Two times a day (BID) | ORAL | Status: DC

## 2016-01-23 MED ORDER — PREDNISONE 5 MG PO TABS: ORAL_TABLET | ORAL | Status: DC

## 2016-01-23 MED FILL — predniSONE 5 MG TABS: 5 | 21 days supply | Qty: 95 | Fill #0

## 2016-01-23 MED FILL — CEFPODOXIME 200 MG TABLET: 200 | 3 days supply | Qty: 6 | Fill #0

## 2016-01-23 NOTE — Discharge Summary (Addendum)
Derek Conner, is a 55 y.o. male  DOB 1961/07/29  MRN [id number].  Admission date:  01/19/2016  Admitting Physician  Ozella Rocks, MD  Discharge Date:  01/23/2016   Primary MD  Saguier, Kateri Mc  Recommendations for primary care physician for things to follow:   CBC, BMP, 2 view chest x-ray in a week. Outpatient pulmonary follow-up.  Also need outpatient interventional neurology follow-up.     Admission Diagnosis  Acute exacerbation of chronic obstructive pulmonary disease (COPD) (HCC) [J44.1]  Discharge Diagnosis  Acute exacerbation of chronic obstructive pulmonary disease (COPD) (HCC) [J44.1]     Principal Problem:   CAP (community acquired pneumonia) Active Problems:   COPD with exacerbation (HCC)   Cough syncope   Aneurysm (HCC)      Past Medical History  Diagnosis Date  . COPD (chronic obstructive pulmonary disease) (HCC)   . Emphysema lung (HCC)   . Hypertension   . Emphysema of lung (HCC)   . High cholesterol   . Asthma   . CAP (community acquired pneumonia) 01/19/2016  . Pneumonia 1960s  . Chronic bronchitis (HCC)   . Hepatitis A infection ~ 1980  . Rheumatoid arthritis (HCC)     "hands mostly"  ( 01/19/2016)  . Situational depression 12/07/09    "only when my mother died"    Past Surgical History  Procedure Laterality Date  . Foot fracture surgery Left ~ 12-08-2003    "it was crushed"  . Cystectomy Left 1960's    "wrist"  . Chest tube insertion Left 1990's    "lung collapsed"  . Video bronchoscopy Bilateral 12/06/2013    Procedure: VIDEO BRONCHOSCOPY WITHOUT FLUORO;  Surgeon: Alyson Reedy, MD;  Location: [facility] ENDOSCOPY;  Service: Cardiopulmonary;  Laterality: Bilateral;  . Fracture surgery    . Appendectomy  1980's       HPI  from the history and physical done on the day of  admission:   Derek Conner is a 55 y.o. male with medical history significant of COPD, patient presents to the ED with c/o ongoing cough, SOB. Nothing makes symptoms better or worse. Symptoms ongoing for several days now, got CXR 2 days ago and found to have RML nodular opacity diagnosed as CAP. Started on levaquin and prednisone, no improvement since that time so returns to ED.  ED Course: Had episode of cough syncope while in ED. Also has new O2 requirement. Started on treatment for COPD and transferred to [facility]      Hospital Course:     1.Community-acquired pneumonia with mild COPD exacerbation. Clinically much improved, chest x-ray much improved, Had failed outpatient Levaquin treatment and responded very well to oral steroids along with IV Vantin and azithromycin combination, chest x-ray improved he is clinically improved, he has been counseled to quit smoking. He currently does not require any oxygen, will be placed on 3 more days of antibiotics along with a steroid taper, continue home breathing treatments and follow with PCP along with primary pulmonologist in 1-2 weeks. Request PCP  to check CBC, BMP and a two-view chest x-ray within a week.  2. COPD exacerbation mild with ongoing smoking. Plan as above. Counseled to quit smoking.  3. Essential hypertension. On Norvasc, clonidine and ACE inhibitor.  4. Dyslipidemia. Continue home dose statin.  5. Episodes of posttussive syncope. Seen by neuro, EEG Stable,  MRI showed an incidental aneurysm, per neuro no further workup. Stable tele, Patient and wife educated in detail bedside about v posttussive syncope . No loud murmurs, do not suspect any structural heart problems, Echo done & stable.  6. Incidental finding of brain aneurysm. Outpatient follow-up with interventional neurologist Dr. Corliss Skains.    Follow UP  Follow-up Information    Follow up with Saguier, Ramon Dredge, PA-C. Schedule an appointment as soon as possible for a  visit in 1 week.   Specialties:  Internal Medicine, Family Medicine   Contact information:   [address] [place] [address] [phone]       Follow up with DEVESHWAR, Grandville Silos, MD. Schedule an appointment as soon as possible for a visit in 1 week.   Specialty:  Interventional Radiology   Contact information:   [address] [place] [address] [phone]       Follow up with [facility], MD. Schedule an appointment as soon as possible for a visit in 1 week.   Specialty:  Pulmonary Disease   Contact information:   [address] [place] [address] [phone]        Consults obtained - Neuro  Discharge Condition: Stable  Diet and Activity recommendation: See Discharge Instructions below  Discharge Instructions       Discharge Instructions    Diet - low sodium heart healthy    Complete by:  As directed      Discharge instructions    Complete by:  As directed   Follow with Primary MD Saguier, Ramon Dredge, PA-C in 7 days   Get CBC, CMP, 2 view Chest X ray checked  by Primary MD next visit.    Activity: As tolerated with Full fall precautions use walker/cane & assistance as needed   Disposition Home     Diet:   Heart Healthy    For Heart failure patients - Check your Weight same time everyday, if you gain over 2 pounds, or you develop in leg swelling, experience more shortness of breath or chest pain, call your Primary MD immediately. Follow Cardiac Low Salt Diet and 1.5 lit/day fluid restriction.   On your next visit with your primary care physician please Get Medicines reviewed and adjusted.   Please request your Prim.MD to go over all Hospital Tests and Procedure/Radiological results at the follow up, please get all Hospital records sent to your Prim MD by signing hospital release before you go home.   If you experience worsening of your admission symptoms, develop shortness of breath, life threatening emergency,  suicidal or homicidal thoughts you must seek medical attention immediately by calling 911 or calling your MD immediately  if symptoms less severe.  You Must read complete instructions/literature along with all the possible adverse reactions/side effects for all the Medicines you take and that have been prescribed to you. Take any new Medicines after you have completely understood and accpet all the possible adverse reactions/side effects.   Do not drive, operate heavy machinery, perform activities at heights, swimming or participation in water activities or provide baby sitting services if your were admitted for syncope or siezures  until you have seen by Primary MD or a Neurologist and advised to do so again.  Do not drive when taking Pain medications.    Do not take more than prescribed Pain, Sleep and Anxiety Medications  Special Instructions: If you have smoked or chewed Tobacco  in the last 2 yrs please stop smoking, stop any regular Alcohol  and or any Recreational drug use.  Wear Seat belts while driving.   Please note  You were cared for by a hospitalist during your hospital stay. If you have any questions about your discharge medications or the care you received while you were in the hospital after you are discharged, you can call the unit and asked to speak with the hospitalist on call if the hospitalist that took care of you is not available. Once you are discharged, your primary care physician will handle any further medical issues. Please note that NO REFILLS for any discharge medications will be authorized once you are discharged, as it is imperative that you return to your primary care physician (or establish a relationship with a primary care physician if you do not have one) for your aftercare needs so that they can reassess your need for medications and monitor your lab values.     Increase activity slowly    Complete by:  As directed              Discharge Medications        Medication List    STOP taking these medications        levofloxacin 750 MG tablet  Commonly known as:  LEVAQUIN     predniSONE 20 MG tablet  Commonly known as:  DELTASONE  Replaced by:  predniSONE 5 MG tablet      TAKE these medications        azithromycin 250 MG tablet  Commonly known as:  ZITHROMAX  Take 1 tablet (250 mg total) by mouth daily.     cefpodoxime 200 MG tablet  Commonly known as:  VANTIN  Take 1 tablet (200 mg total) by mouth 2 (two) times daily.     predniSONE 5 MG tablet  Commonly known as:  DELTASONE  Label  & dispense according to the schedule below. 10 Pills PO for 3 days then, 8 Pills PO for 3 days, 6 Pills PO for 3 days, 4 Pills PO for 3 days, 2 Pills PO for 3 days, 1 Pills PO for 3 days, 1/2 Pill  PO for 3 days then STOP. Total 95 pills.      ASK your doctor about these medications        albuterol 108 (90 Base) MCG/ACT inhaler  Commonly known as:  PROVENTIL HFA;VENTOLIN HFA  Inhale 2 puffs into the lungs every 6 (six) hours as needed for wheezing or shortness of breath.     amLODipine 10 MG tablet  Commonly known as:  NORVASC  TAKE 1 TABLET (10 MG) BY MOUTH DAILY.     atorvastatin 20 MG tablet  Commonly known as:  LIPITOR  Take 1 tablet (20 mg total) by mouth daily.     b complex vitamins tablet  Take 1 tablet by mouth daily.     cloNIDine 0.1 MG tablet  Commonly known as:  CATAPRES  TAKE 1 TABLET (0.1 MG TOTAL) BY MOUTH 2 (TWO) TIMES DAILY.     Fluticasone-Salmeterol 500-50 MCG/DOSE Aepb  Commonly known as:  ADVAIR DISKUS  INHALE 1 PUFF BY MOUTH INTO THE LUNGS 2 TIMES  DAILY.     ibuprofen 200 MG tablet  Commonly known as:  ADVIL,MOTRIN  Take 200 mg by mouth every 6 (six) hours as needed.     lisinopril 10 MG tablet  Commonly known as:  PRINIVIL,ZESTRIL  TAKE 1 TABLET (10 MG) BY MOUTH DAILY.     multivitamin with minerals Tabs tablet  Take 1 tablet by mouth daily.     tiotropium 18 MCG inhalation capsule  Commonly  known as:  SPIRIVA HANDIHALER  PLACE 1 CAPSULE INTO INHALER AND INHALE INTO LUNGS DAILY.        Major procedures and Radiology Reports - PLEASE review detailed and final reports for all details, in brief -   EEG This is a normal EEG for the patients stated age. There were no focal, hemispheric or lateralizing features. No epileptiform activity was recorded. A normal EEG does not exclude the diagnosis of a seizure disorder and if seizure remains high on the list of differential diagnosis, an ambulatory EEG may be of value. Clinical correlation is required.   TTE   Left ventricle: The cavity size was normal. There was mild concentric hypertrophy. Systolic function was normal. The estimated ejection fraction was in the range of 55% to 60%. Wall motion was normal; there were no regional wall motion  abnormalities. Left ventricular diastolic function parameters were normal. - Aortic valve: Transvalvular velocity was within the normal range. There was no stenosis. There was no regurgitation. - Mitral valve: Transvalvular velocity was within the normal range. There was no evidence for stenosis. There was no regurgitation. - Left atrium: The atrium was moderately dilated. - Right ventricle: The cavity size was normal. Wall thickness was normal. Systolic function was normal. - Atrial septum: No defect or patent foramen ovale was identified by color flow Doppler. - Tricuspid valve: There was no regurgitation.   Ct Angio Head W Or Wo Contrast  01/22/2016  CLINICAL DATA:  Episode of loss of consciousness. Possible seizure. Rule out vertebrobasilar insufficiency. 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 EXAM: CT ANGIOGRAPHY HEAD AND NECK TECHNIQUE: Multidetector CT imaging of the head and neck was performed using the standard protocol during bolus administration of intravenous contrast. Multiplanar CT image reconstructions and MIPs were obtained to evaluate the vascular anatomy. Carotid stenosis measurements (when  applicable) are obtained utilizing NASCET criteria, using the distal internal carotid diameter as the denominator. CONTRAST:  50 mL Isovue 370 IV COMPARISON:  MRI 01/21/2016 FINDINGS: CT HEAD Brain: Negative for acute infarct.  Negative for hemorrhage or mass. Calvarium and skull base: Negative Paranasal sinuses: Mucosal edema in the paranasal sinuses without air-fluid level. Mastoid sinus clear bilaterally. Orbits: No orbital lesion identified. CTA NECK Aortic arch: 4 vessel aortic arch. Left vertebral artery origin from the aortic arch. Great vessels widely patent. Normal aortic arch. Linear apical scarring in the bases bilaterally. Adjacent density in the right apex is a more confluent density which is asymmetric. This measures approximately 12 x 20 mm. Additional density in the right upper lobe more inferiorly measuring 13 mm. There is adjacent bronchial wall thickening. There is apical emphysema bilaterally. Right carotid system: Mild atherosclerotic calcification of the carotid bifurcation without significant stenosis or dissection Left carotid system: No significant stenosis or dissection. No areas of calcification Vertebral arteries:Both vertebral arteries patent to the basilar without significant stenosis. Skeleton: Cervical spine degenerative change. No fracture or bone lesion. Other neck: Negative for mass or adenopathy. CTA HEAD Anterior circulation: Mild atherosclerotic disease in the cavernous carotid bilaterally without significant stenosis.  No cavernous aneurysm. Anterior and middle cerebral arteries patent bilaterally without stenosis. 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 The aneurysm projects inferiorly and shows homogeneous enhancement. This corresponds to the finding on MRI. Posterior circulation: Both vertebral arteries patent to the basilar. PICA patent bilaterally. Basilar widely patent. Superior cerebellar and posterior cerebral arteries patent bilaterally. No additional aneurysm in  the posterior circulation. Venous sinuses: Patent Anatomic variants: None Delayed phase: Delayed phase imaging demonstrates persistent enhancement of the right MCA aneurysm. No enhancing mass lesion. 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 No additional aneurysm. No evidence of aneurysm rupture on today's study. No significant carotid or vertebral artery stenosis in the neck. No significant intracranial stenosis. Apical emphysema bilaterally. There is apical scarring bilaterally. Asymmetric soft tissue density right apex likely represents scarring and is similar to prior chest CT. Electronically Signed   By: Marlan Palau M.D.   On: 01/22/2016 15:48   Dg Chest 2 View  01/21/2016  CLINICAL DATA:  Shortness of breath. EXAM: CHEST  2 VIEW COMPARISON:  Radiograph of January 19, 2016. FINDINGS: The heart size and mediastinal contours are within normal limits. No pneumothorax or pleural effusion is noted. Hyperexpansion of the lungs is noted. Right upper lobe opacity is slightly improved consistent with residual inflammation or subsegmental atelectasis. Left lung is clear. The visualized skeletal structures are unremarkable. IMPRESSION: Slightly decreased right upper lobe opacity is noted suggesting improving pneumonia or subsegmental atelectasis. Continued radiographic follow-up is recommended to ensure resolution or stability. Hyperexpansion of the lungs is noted suggesting chronic obstructive pulmonary disease. Electronically Signed   By: Lupita Raider, M.D.   On: 01/21/2016 09:10   Dg Chest 2 View  01/19/2016  CLINICAL DATA:  Cough for 5-6 days.  Shortness of breath. EXAM: CHEST  2 VIEW COMPARISON:  CT chest 09/15/2015.  PA and lateral chest 01/17/2016. FINDINGS: The lungs are severely emphysematous. Small focus of nodular opacity in the right upper lobe is unchanged. No pneumothorax or pleural effusion. Heart size is normal. IMPRESSION: No acute disease. Severe emphysema.  No change in a nodular  opacity. Electronically Signed   By: Drusilla Kanner M.D.   On: 01/19/2016 11:38   Dg Chest 2 View  01/17/2016  CLINICAL DATA:  Shortness of breath and cough for 3 days EXAM: CHEST  2 VIEW COMPARISON:  Chest radiograph Dec 06, 2013 and chest CT September 15, 2015 FINDINGS: There is a degree of underlying emphysematous change. There is patchy infiltrate in the right mid lung. There are scattered areas of mild scarring bilaterally. Heart size is normal. Pulmonary vascularity reflects the underlying emphysematous change and is stable. No adenopathy is evident. No bone lesions. IMPRESSION: Focal area of airspace disease, consistent with pneumonia, in the right mid lung. Underlying emphysematous change. Stable cardiac silhouette. Followup PA and lateral chest radiographs recommended in 3-4 weeks following trial of antibiotic therapy to ensure resolution and exclude underlying malignancy. Electronically Signed   By: Bretta Bang III M.D.   On: 01/17/2016 09:29   Ct Angio Neck W Or Wo Contrast  01/22/2016  CLINICAL DATA:  Episode of loss of consciousness. Possible seizure. Rule out vertebrobasilar insufficiency. 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 EXAM: CT ANGIOGRAPHY HEAD AND NECK TECHNIQUE: Multidetector CT imaging of the head and neck was performed using the standard protocol during bolus administration of intravenous contrast. Multiplanar CT image reconstructions and MIPs were obtained to evaluate the vascular anatomy. Carotid stenosis measurements (when applicable) are obtained utilizing NASCET criteria, using  the distal internal carotid diameter as the denominator. CONTRAST:  50 mL Isovue 370 IV COMPARISON:  MRI 01/21/2016 FINDINGS: CT HEAD Brain: Negative for acute infarct.  Negative for hemorrhage or mass. Calvarium and skull base: Negative Paranasal sinuses: Mucosal edema in the paranasal sinuses without air-fluid level. Mastoid sinus clear bilaterally. Orbits: No orbital lesion identified. CTA NECK Aortic arch:  4 vessel aortic arch. Left vertebral artery origin from the aortic arch. Great vessels widely patent. Normal aortic arch. Linear apical scarring in the bases bilaterally. Adjacent density in the right apex is a more confluent density which is asymmetric. This measures approximately 12 x 20 mm. Additional density in the right upper lobe more inferiorly measuring 13 mm. There is adjacent bronchial wall thickening. There is apical emphysema bilaterally. Right carotid system: Mild atherosclerotic calcification of the carotid bifurcation without significant stenosis or dissection Left carotid system: No significant stenosis or dissection. No areas of calcification Vertebral arteries:Both vertebral arteries patent to the basilar without significant stenosis. Skeleton: Cervical spine degenerative change. No fracture or bone lesion. Other neck: Negative for mass or adenopathy. CTA HEAD Anterior circulation: Mild atherosclerotic disease in the cavernous carotid bilaterally without significant stenosis. No cavernous aneurysm. Anterior and middle cerebral arteries patent bilaterally without stenosis. 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 The aneurysm projects inferiorly and shows homogeneous enhancement. This corresponds to the finding on MRI. Posterior circulation: Both vertebral arteries patent to the basilar. PICA patent bilaterally. Basilar widely patent. Superior cerebellar and posterior cerebral arteries patent bilaterally. No additional aneurysm in the posterior circulation. Venous sinuses: Patent Anatomic variants: None Delayed phase: Delayed phase imaging demonstrates persistent enhancement of the right MCA aneurysm. No enhancing mass lesion. 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 No additional aneurysm. No evidence of aneurysm rupture on today's study. No significant carotid or vertebral artery stenosis in the neck. No significant intracranial stenosis. Apical emphysema bilaterally. There is  apical scarring bilaterally. Asymmetric soft tissue density right apex likely represents scarring and is similar to prior chest CT. Electronically Signed   By: Marlan Palau M.D.   On: 01/22/2016 15:48   Mr Brain Wo Contrast  01/21/2016  CLINICAL DATA:  Cough syncope earlier today.  Pneumonia. EXAM: MRI HEAD WITHOUT CONTRAST TECHNIQUE: Multiplanar, multiecho pulse sequences of the brain and surrounding structures were obtained without intravenous contrast. COMPARISON:  None. FINDINGS: No evidence for acute infarction, hemorrhage, mass lesion, hydrocephalus, or extra-axial fluid. Premature for age cerebral and cerebellar atrophy. Minor white matter disease, nonspecific. Unremarkable pituitary and cerebellar tonsils. Mild cervical spondylosis. Flow voids are maintained throughout.  No chronic hemorrhage. In the RIGHT sylvian fissure, there is an ovoid vascular structure measuring 6 x 6 x 8 mm arising from the RIGHT MCA bifurcation or trifurcation consistent with an unruptured saccular aneurysm. Within limits for detection on routine MR brain, no other saccular aneurysms are suspected. No surrounding blood products to suggest recent hemorrhage, although noncontrast CT is more sensitive. Minor layering fluid in the RIGHT maxillary sinus suggesting acuity. Moderate mucosal thickening in the BILATERAL ethmoid air cells. No significant frontal or sphenoid sinus fluid. Negative orbits and temporal bones. Visualized scalp soft tissues and salivary glands unremarkable. IMPRESSION: Premature for age atrophy.  No acute stroke. 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 Further characterization with CTA head is warranted. These results will be called to the ordering clinician or representative by the Radiologist Assistant, and communication documented in the PACS or zVision Dashboard. Electronically Signed  By: Elsie Stain M.D.   On: 01/21/2016 13:56    Micro Results      Recent  Results (from the past 240 hour(s))  Culture, blood (routine x 2) Call MD if unable to obtain prior to antibiotics being given     Status: None (Preliminary result)   Collection Time: 01/19/16  8:39 PM  Result Value Ref Range Status   Specimen Description BLOOD LEFT HAND  Final   Special Requests BOTTLES DRAWN AEROBIC AND ANAEROBIC 5CC  Final   Culture NO GROWTH 3 DAYS  Final   Report Status PENDING  Incomplete  Culture, blood (routine x 2) Call MD if unable to obtain prior to antibiotics being given     Status: None (Preliminary result)   Collection Time: 01/19/16  8:48 PM  Result Value Ref Range Status   Specimen Description BLOOD RIGHT ANTECUBITAL  Final   Special Requests BOTTLES DRAWN AEROBIC AND ANAEROBIC 7CC  Final   Culture NO GROWTH 3 DAYS  Final   Report Status PENDING  Incomplete  Culture, sputum-assessment     Status: None   Collection Time: 01/20/16 12:55 AM  Result Value Ref Range Status   Specimen Description EXPECTORATED SPUTUM  Final   Special Requests NONE  Final   Sputum evaluation   Final    THIS SPECIMEN IS ACCEPTABLE. RESPIRATORY CULTURE REPORT TO FOLLOW.   Report Status 01/20/2016 FINAL  Final  Culture, respiratory (NON-Expectorated)     Status: None   Collection Time: 01/20/16 12:55 AM  Result Value Ref Range Status   Specimen Description EXPECTORATED SPUTUM  Final   Special Requests NONE  Final   Gram Stain   Final    RARE SQUAMOUS EPITHELIAL CELLS PRESENT RARE WBC PRESENT, PREDOMINANTLY MONONUCLEAR FEW GRAM POSITIVE COCCI IN PAIRS FEW GRAM POSITIVE RODS    Culture Consistent with normal respiratory flora.  Final   Report Status 01/23/2016 FINAL  Final       Today   Subjective    Derek Conner today has no headache,no chest abdominal pain,no new weakness tingling or numbness, feels much better wants to go home today.     Objective   Blood pressure 157/79, pulse 90, temperature 98.6 F (37 C), temperature source Oral, resp. rate 16,  height 6' (1.829 m), weight 81.647 kg (180 lb), SpO2 95 %.   Intake/Output Summary (Last 24 hours) at 01/23/16 1131 Last data filed at 01/23/16 1001  Gross per 24 hour  Intake    600 ml  Output      0 ml  Net    600 ml    Exam Awake Alert, Oriented x 3, No new F.N deficits, Normal affect [place].AT,PERRAL Supple Neck,No JVD, No cervical lymphadenopathy appriciated.  Symmetrical Chest wall movement, Mod air movement bilaterally, minimal wheezing RRR,No Gallops,Rubs or new Murmurs, No Parasternal Heave +ve B.Sounds, Abd Soft, Non tender, No organomegaly appriciated, No rebound -guarding or rigidity. No Cyanosis, Clubbing or edema, No new Rash or bruise   Data Review   CBC w Diff: Lab Results  Component Value Date   WBC 11.3* 01/19/2016   HGB 13.7 01/19/2016   HCT 40.9 01/19/2016   PLT 313 01/19/2016   LYMPHOPCT 17 01/19/2016   MONOPCT 10 01/19/2016   EOSPCT 1 01/19/2016   BASOPCT 0 01/19/2016    CMP: Lab Results  Component Value Date   NA 134* 01/20/2016   K 4.5 01/20/2016   CL 102 01/20/2016   CO2 23 01/20/2016   BUN  25* 01/20/2016   CREATININE 1.32* 01/20/2016   PROT 7.5 01/19/2016   ALBUMIN 4.1 01/19/2016   BILITOT 0.5 01/19/2016   ALKPHOS 60 01/19/2016   AST 27 01/19/2016   ALT 25 01/19/2016  .   Total Time in preparing paper work, data evaluation and todays exam - 35 minutes  Leroy Sea M.D on 01/23/2016 at 11:31 AM  [facility]   Office  [phone]

## 2016-01-23 NOTE — Progress Notes (Signed)
Notified MD that echo is completed on 5w37 Derek Conner.

## 2016-01-23 NOTE — Discharge Instructions (Signed)
Follow with Primary MD Saguier, Ramon Dredge, PA-C in 7 days   Get CBC, CMP, 2 view Chest X ray checked  by Primary MD next visit.    Activity: As tolerated with Full fall precautions use walker/cane & assistance as needed   Disposition Home     Diet:   Heart Healthy    For Heart failure patients - Check your Weight same time everyday, if you gain over 2 pounds, or you develop in leg swelling, experience more shortness of breath or chest pain, call your Primary MD immediately. Follow Cardiac Low Salt Diet and 1.5 lit/day fluid restriction.   On your next visit with your primary care physician please Get Medicines reviewed and adjusted.   Please request your Prim.MD to go over all Hospital Tests and Procedure/Radiological results at the follow up, please get all Hospital records sent to your Prim MD by signing hospital release before you go home.   If you experience worsening of your admission symptoms, develop shortness of breath, life threatening emergency, suicidal or homicidal thoughts you must seek medical attention immediately by calling 911 or calling your MD immediately  if symptoms less severe.  You Must read complete instructions/literature along with all the possible adverse reactions/side effects for all the Medicines you take and that have been prescribed to you. Take any new Medicines after you have completely understood and accpet all the possible adverse reactions/side effects.   Do not drive, operate heavy machinery, perform activities at heights, swimming or participation in water activities or provide baby sitting services if your were admitted for syncope or siezures until you have seen by Primary MD or a Neurologist and advised to do so again.  Do not drive when taking Pain medications.    Do not take more than prescribed Pain, Sleep and Anxiety Medications  Special Instructions: If you have smoked or chewed Tobacco  in the last 2 yrs please stop smoking, stop any  regular Alcohol  and or any Recreational drug use.  Wear Seat belts while driving.   Please note  You were cared for by a hospitalist during your hospital stay. If you have any questions about your discharge medications or the care you received while you were in the hospital after you are discharged, you can call the unit and asked to speak with the hospitalist on call if the hospitalist that took care of you is not available. Once you are discharged, your primary care physician will handle any further medical issues. Please note that NO REFILLS for any discharge medications will be authorized once you are discharged, as it is imperative that you return to your primary care physician (or establish a relationship with a primary care physician if you do not have one) for your aftercare needs so that they can reassess your need for medications and monitor your lab values.

## 2016-01-23 NOTE — Progress Notes (Signed)
Reviewed discharge instruction with patient and wife at the bedside. Patient and wife verbalized instruction.IV removed and prescription given. Skin intact

## 2016-01-23 NOTE — Telephone Encounter (Signed)
Please arrange a hospital follow up with Derek Conner in 1 week.

## 2016-01-23 NOTE — Progress Notes (Signed)
Subjective: No further events.   Exam: Filed Vitals:   01/23/16 0459 01/23/16 0923  BP: 142/84 157/79  Pulse: 79 90  Temp: 98.6 F (37 C)   Resp: 16    Gen: In bed, NAD Resp: non-labored breathing, no acute distress  Neuro: Awake, alert, interactive and appropriate.    Impression: 55 yo wm with three episodes of syncope which could be related to hypoxia, decreased venous return due to increased intrathoracic pressure with coughing, bradycardia from vagal response. 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 He had at least one episode that was more focal, but I do not think that this changes of the most likely diagnosis is syncope.  For his aneurysm, he will need to follow up with Dr. Corliss Skains of radiology.   Recommendations: 1) No further recommendations at this time. Please call with further questions or concerns.   Ritta Slot, MD [facility] [phone]  If 7pm- 7am, please page neurology on call as listed in AMION.

## 2016-01-23 NOTE — Telephone Encounter (Signed)
Will you make sure this is a 30 minute follow up appointment from hospital.

## 2016-01-23 NOTE — Progress Notes (Signed)
  Echocardiogram 2D Echocardiogram has been performed.  Arvil Chaco 01/23/2016, 12:25 PM

## 2016-01-24 ENCOUNTER — Telehealth: Payer: Self-pay | Admitting: *Deleted

## 2016-01-24 ENCOUNTER — Other Ambulatory Visit: Payer: Self-pay | Admitting: Medical

## 2016-01-24 LAB — CULTURE, BLOOD (ROUTINE X 2)
CULTURE: NO GROWTH
Culture: NO GROWTH

## 2016-01-24 MED FILL — ATORVASTATIN 20 MG TABLET: 20 | 90 days supply | Qty: 90 | Fill #0

## 2016-01-24 MED FILL — AZITHROMYCIN 250 MG TABLET: 250 | 3 days supply | Qty: 3 | Fill #0

## 2016-01-24 NOTE — Telephone Encounter (Signed)
Unable to reach patient at time of TCM Call. Left message for patient to return call when available.  

## 2016-01-24 NOTE — Telephone Encounter (Signed)
Late entry from 01/23/16 Nurse from the hospital called stating hospital attending MD wanted pt to be seen by Friday this week. Ramon Dredge and Melissa unavailable so scheduled pt for f/u with Dr. Patsy Lager.

## 2016-01-25 ENCOUNTER — Encounter: Payer: Self-pay | Admitting: Family Medicine

## 2016-01-25 ENCOUNTER — Ambulatory Visit (INDEPENDENT_AMBULATORY_CARE_PROVIDER_SITE_OTHER): Payer: 59 | Admitting: Family Medicine

## 2016-01-25 VITALS — BP 128/72 | HR 78 | Temp 98.2°F | Ht 72.0 in | Wt 190.0 lb

## 2016-01-25 DIAGNOSIS — I10 Essential (primary) hypertension: Secondary | ICD-10-CM | POA: Diagnosis not present

## 2016-01-25 DIAGNOSIS — J441 Chronic obstructive pulmonary disease with (acute) exacerbation: Secondary | ICD-10-CM | POA: Diagnosis not present

## 2016-01-25 DIAGNOSIS — J189 Pneumonia, unspecified organism: Secondary | ICD-10-CM

## 2016-01-25 DIAGNOSIS — E785 Hyperlipidemia, unspecified: Secondary | ICD-10-CM

## 2016-01-25 MED ORDER — TIOTROPIUM BROMIDE MONOHYDRATE 18 MCG IN CAPS: ORAL_CAPSULE | RESPIRATORY_TRACT | Status: DC

## 2016-01-25 MED ORDER — ATORVASTATIN CALCIUM 20 MG PO TABS: ORAL_TABLET | ORAL | Status: DC

## 2016-01-25 MED ORDER — AMLODIPINE BESYLATE 10 MG PO TABS: ORAL_TABLET | ORAL | Status: DC

## 2016-01-25 MED ORDER — CLONIDINE HCL 0.1 MG PO TABS: ORAL_TABLET | ORAL | Status: DC

## 2016-01-25 MED ORDER — LISINOPRIL 10 MG PO TABS: ORAL_TABLET | ORAL | Status: DC

## 2016-01-25 MED ORDER — ALBUTEROL SULFATE HFA 108 (90 BASE) MCG/ACT IN AERS: 2.0000 | INHALATION_SPRAY | Freq: Four times a day (QID) | RESPIRATORY_TRACT | Status: DC | PRN

## 2016-01-25 MED ORDER — FLUTICASONE-SALMETEROL 500-50 MCG/DOSE IN AEPB: INHALATION_SPRAY | RESPIRATORY_TRACT | Status: DC

## 2016-01-25 MED FILL — VENTOLIN HFA 90 MCG INHALER: 108 (90 BAS | 90 days supply | Qty: 54 | Fill #0

## 2016-01-25 MED FILL — ADVAIR 500/50 DISKUS: 500-50 | 90 days supply | Qty: 180 | Fill #0

## 2016-01-25 MED FILL — cloNIDine HCL 0.1 MG TABS: 0.1 | 90 days supply | Qty: 180 | Fill #0

## 2016-01-25 NOTE — Progress Notes (Signed)
[facility] [address] [place], [place] [address] [phone]  Date:  01/25/2016   Name:  Derek Conner   DOB:  27-Apr-1961   MRN:  [id number]  PCP:  Esperanza Richters, PA-C    Chief Complaint: Hospitalization Follow-up   History of Present Illness:  Derek Conner is a 55 y.o. very pleasant male patient who presents with the following:  He was admitted from 6/16 to 6/20 with acute exacerbation of COPD and pneumonia that had not improved on outpt levaquin.  He was sent home with vnatin and aithromycin po. He was also noted to have an aneurysm (brain MRI done due to cough syncope)- he will see NSG next week about this  He is feeling tired and is not sleeping all that well.  This is not due to cough- he thinks that the predniosne is keeping him up at night He still feels very tired and worn out  He is on 2 oral abx which he is tolerating well He feels like his breathing is "a whole lot better, I still feel a little rattle and I still cough a little bit, but nothing like it was." He is able to walk around the house wihtout excessive SOB He has not noted any fever He is earing well- he is hungry  He is on spiriva, adviar, and will use his albuterol about 2x a day. In between he is not needing albuterol much.   He also needs RF of all his regular meds today  Patient Active Problem List   Diagnosis Date Noted  . Aneurysm (HCC)   . COPD with exacerbation (HCC) 01/19/2016  . CAP (community acquired pneumonia) 01/19/2016  . Cough syncope 01/19/2016  . Wellness examination 08/29/2015  . Hyperglycemia 01/06/2015  . HTN (hypertension) 12/09/2014  . COPD exacerbation (HCC) 07/08/2014  . Atypical chest pain 07/08/2014  . COLD (chronic obstructive lung disease) (HCC) 02/15/2014  . Dry mouth 12/26/2013  . Smoking 12/26/2013  . Nodule of right lung 12/26/2013  . COPD, severity to be determined (HCC) 12/26/2013  .  Pulmonary nodule, right 12/06/2013  . Hemoptysis 12/03/2013  . COPD (chronic obstructive pulmonary disease) (HCC) 12/03/2013  . Acute bronchitis 12/03/2013  . Tobacco use disorder 12/03/2013    Past Medical History  Diagnosis Date  . COPD (chronic obstructive pulmonary disease) (HCC)   . Emphysema lung (HCC)   . Hypertension   . Emphysema of lung (HCC)   . High cholesterol   . Asthma   . CAP (community acquired pneumonia) 01/19/2016  . Pneumonia 1960s  . Chronic bronchitis (HCC)   . Hepatitis A infection ~ 1980  . Rheumatoid arthritis (HCC)     "hands mostly"  ( 01/19/2016)  . Situational depression Nov 30, 2009    "only when my mother died"    Past Surgical History  Procedure Laterality Date  . Foot fracture surgery Left ~ 01-Dec-2003    "it was crushed"  . Cystectomy Left 1960's    "wrist"  . Chest tube insertion Left 1990's    "lung collapsed"  . Video bronchoscopy Bilateral 12/06/2013    Procedure: VIDEO BRONCHOSCOPY WITHOUT FLUORO;  Surgeon: Alyson Reedy, MD;  Location: [facility] ENDOSCOPY;  Service: Cardiopulmonary;  Laterality: Bilateral;  . Fracture surgery    . Appendectomy  1980's    Social History  Substance Use Topics  . Smoking status: Current Every Day Smoker -- 1.00 packs/day for 40 years  Types: Cigarettes  . Smokeless tobacco: Never Used     Comment: down to 6 cigs/day 3.31.16  . Alcohol Use: 8.4 oz/week    14 Cans of beer per week     Comment: 12/04/2013 "2beers/day"    Family History  Problem Relation Age of Onset  . Cancer Mother   . Arthritis Mother   . Stroke Father   . Hypertension Father   . Colon cancer Neg Hx     Allergies  Allergen Reactions  . Symbicort [Budesonide-Formoterol Fumarate] Shortness Of Breath, Nausea And Vomiting and Other (See Comments)    heachache  . Dulera [Mometasone Furo-Formoterol Fum]     Headache    Medication list has been reviewed and updated.  Current Outpatient Prescriptions on File Prior to Visit  Medication Sig  Dispense Refill  . albuterol (PROVENTIL HFA;VENTOLIN HFA) 108 (90 BASE) MCG/ACT inhaler Inhale 2 puffs into the lungs every 6 (six) hours as needed for wheezing or shortness of breath. 1 Inhaler 6  . amLODipine (NORVASC) 10 MG tablet TAKE 1 TABLET (10 MG) BY MOUTH DAILY. 90 tablet 1  . atorvastatin (LIPITOR) 20 MG tablet Take 1 tablet (20 mg total) by mouth daily. 30 tablet 2  . atorvastatin (LIPITOR) 20 MG tablet TAKE 1 TABLET (20 MG) BY MOUTH DAILY. 30 tablet 3  . azithromycin (ZITHROMAX) 250 MG tablet Take 1 tablet (250 mg total) by mouth daily. 3 each 0  . b complex vitamins tablet Take 1 tablet by mouth daily.    . cefpodoxime (VANTIN) 200 MG tablet Take 1 tablet (200 mg total) by mouth 2 (two) times daily. 6 tablet 0  . cloNIDine (CATAPRES) 0.1 MG tablet TAKE 1 TABLET (0.1 MG TOTAL) BY MOUTH 2 (TWO) TIMES DAILY. 180 tablet 1  . Fluticasone-Salmeterol (ADVAIR DISKUS) 500-50 MCG/DOSE AEPB INHALE 1 PUFF BY MOUTH INTO THE LUNGS 2 TIMES DAILY. 2 each 0  . ibuprofen (ADVIL,MOTRIN) 200 MG tablet Take 200 mg by mouth every 6 (six) hours as needed.    Marland Kitchen lisinopril (PRINIVIL,ZESTRIL) 10 MG tablet TAKE 1 TABLET (10 MG) BY MOUTH DAILY. 90 tablet 1  . Multiple Vitamin (MULTIVITAMIN WITH MINERALS) TABS tablet Take 1 tablet by mouth daily. 30 tablet 0  . predniSONE (DELTASONE) 5 MG tablet Label  & dispense according to the schedule below. 10 Pills PO for 3 days then, 8 Pills PO for 3 days, 6 Pills PO for 3 days, 4 Pills PO for 3 days, 2 Pills PO for 3 days, 1 Pills PO for 3 days, 1/2 Pill  PO for 3 days then STOP. Total 95 pills. 95 tablet 0  . tiotropium (SPIRIVA HANDIHALER) 18 MCG inhalation capsule PLACE 1 CAPSULE INTO INHALER AND INHALE INTO LUNGS DAILY. 30 capsule 6   No current facility-administered medications on file prior to visit.    Review of Systems:  As per HPI- otherwise negative.   Physical Examination: Filed Vitals:   01/25/16 1156  BP: 128/72  Pulse: 78  Temp: 98.2 F (36.8 C)    Filed Vitals:   01/25/16 1156  Height: 6' (1.829 m)  Weight: 190 lb (86.183 kg)   Body mass index is 25.76 kg/(m^2). Ideal Body Weight: Weight in (lb) to have BMI = 25: 183.9  GEN: WDWN, NAD, Non-toxic, A & O x 3, looks well HEENT: Atraumatic, Normocephalic. Neck supple. No masses, No LAD.  Bilateral TM wnl, oropharynx normal.  PEERL,EOMI.   Ears and Nose: No external deformity. CV: RRR, No M/G/R. No JVD.  No thrill. No extra heart sounds. PULM: CTA B, no wheezes, crackles, rhonchi. No retractions. No resp. distress. No accessory muscle use. EXTR: No c/c/e NEURO Normal gait.  PSYCH: Normally interactive. Conversant. Not depressed or anxious appearing.  Calm demeanor.    Assessment and Plan: CAP (community acquired pneumonia) - Plan: DG Chest 2 View  COPD exacerbation (HCC) - Plan: tiotropium (SPIRIVA HANDIHALER) 18 MCG inhalation capsule, Fluticasone-Salmeterol (ADVAIR DISKUS) 500-50 MCG/DOSE AEPB, albuterol (PROVENTIL HFA;VENTOLIN HFA) 108 (90 Base) MCG/ACT inhaler, tiotropium (SPIRIVA HANDIHALER) 18 MCG inhalation capsule  Essential hypertension - Plan: lisinopril (PRINIVIL,ZESTRIL) 10 MG tablet, cloNIDine (CATAPRES) 0.1 MG tablet, amLODipine (NORVASC) 10 MG tablet  Hyperlipidemia - Plan: atorvastatin (LIPITOR) 20 MG tablet  Recent admission for CAP He is doing well- continue oral medications Refilled all her usual meds Ordered CXR to be done in about one month He does have a sizeable brain aneurysm- advised him to seek care if any severe or unusual HA  See patient instructions for more details.     Signed Abbe Amsterdam, MD

## 2016-01-25 NOTE — Progress Notes (Signed)
Pre visit review using our clinic review tool, if applicable. No additional management support is needed unless otherwise documented below in the visit note. 

## 2016-01-25 NOTE — Telephone Encounter (Signed)
Pt seen in office today by Dr. Patsy Lager for HFU.

## 2016-01-25 NOTE — Patient Instructions (Signed)
It was nice to see you today- I am glad that you are feeling better! Please come in for a repeat chest x-ray in about one month. You can go to the [facility] dept [facility] to have [facility] done Let me know if you have any concerns or if you do not continue to improve

## 2016-01-31 ENCOUNTER — Ambulatory Visit (HOSPITAL_COMMUNITY): Payer: 59

## 2016-02-02 ENCOUNTER — Ambulatory Visit (HOSPITAL_COMMUNITY)
Admission: RE | Admit: 2016-02-02 | Discharge: 2016-02-02 | Disposition: A | Discharge status: Home / Self Care | Payer: 59 | Source: Ambulatory Visit | Attending: Interventional Radiology | Admitting: Interventional Radiology

## 2016-02-02 ENCOUNTER — Other Ambulatory Visit (HOSPITAL_COMMUNITY): Payer: Self-pay | Admitting: Interventional Radiology

## 2016-02-02 ENCOUNTER — Ambulatory Visit (HOSPITAL_COMMUNITY): Admit: 2016-02-02 | Payer: 59

## 2016-02-02 DIAGNOSIS — I671 Cerebral aneurysm, nonruptured: Secondary | ICD-10-CM

## 2016-02-05 ENCOUNTER — Inpatient Hospital Stay: Payer: 59 | Admitting: Medical

## 2016-02-08 ENCOUNTER — Encounter: Payer: Self-pay | Admitting: Acute Care

## 2016-02-08 ENCOUNTER — Other Ambulatory Visit (INDEPENDENT_AMBULATORY_CARE_PROVIDER_SITE_OTHER): Payer: 59

## 2016-02-08 ENCOUNTER — Telehealth: Payer: Self-pay | Admitting: Acute Care

## 2016-02-08 ENCOUNTER — Ambulatory Visit (INDEPENDENT_AMBULATORY_CARE_PROVIDER_SITE_OTHER): Payer: 59 | Admitting: Acute Care

## 2016-02-08 ENCOUNTER — Ambulatory Visit (INDEPENDENT_AMBULATORY_CARE_PROVIDER_SITE_OTHER)
Admission: RE | Admit: 2016-02-08 | Discharge: 2016-02-08 | Disposition: A | Discharge status: Home / Self Care | Payer: 59 | Source: Ambulatory Visit | Attending: Acute Care | Admitting: Acute Care

## 2016-02-08 ENCOUNTER — Other Ambulatory Visit (HOSPITAL_COMMUNITY): Payer: Self-pay | Admitting: Interventional Radiology

## 2016-02-08 VITALS — BP 122/78 | HR 75 | Ht 72.0 in | Wt 198.0 lb

## 2016-02-08 DIAGNOSIS — J439 Emphysema, unspecified: Secondary | ICD-10-CM

## 2016-02-08 DIAGNOSIS — J189 Pneumonia, unspecified organism: Secondary | ICD-10-CM | POA: Diagnosis not present

## 2016-02-08 DIAGNOSIS — I729 Aneurysm of unspecified site: Secondary | ICD-10-CM

## 2016-02-08 LAB — BASIC METABOLIC PANEL
BUN: 23 mg/dL (ref 6–23)
CALCIUM: 9.6 mg/dL (ref 8.4–10.5)
CO2: 26 mEq/L (ref 19–32)
CREATININE: 1.29 mg/dL (ref 0.40–1.50)
Chloride: 100 mEq/L (ref 96–112)
GFR: 61.33 mL/min (ref >60.00)
GLUCOSE: 111 mg/dL — AB (ref 70–99)
Potassium: 4.6 mEq/L (ref 3.5–5.1)
Sodium: 132 mEq/L — ABNORMAL LOW (ref 135–145)

## 2016-02-08 LAB — CBC WITH DIFFERENTIAL/PLATELET
BASOS PCT: 0.5 % (ref 0.0–3.0)
Basophils Absolute: 0.1 10*3/uL (ref 0.0–0.1)
EOS PCT: 0.8 % (ref 0.0–5.0)
Eosinophils Absolute: 0.1 10*3/uL (ref 0.0–0.7)
HEMATOCRIT: 37.7 % — AB (ref 39.0–52.0)
HEMOGLOBIN: 12.7 g/dL — AB (ref 13.0–17.0)
LYMPHS PCT: 17.8 % (ref 12.0–46.0)
Lymphs Abs: 2.5 10*3/uL (ref 0.7–4.0)
MCHC: 33.7 g/dL (ref 30.0–36.0)
MCV: 86.2 fl (ref 78.0–100.0)
Monocytes Absolute: 0.9 10*3/uL (ref 0.1–1.0)
Monocytes Relative: 6.2 % (ref 3.0–12.0)
NEUTROS ABS: 10.5 10*3/uL — AB (ref 1.4–7.7)
Neutrophils Relative %: 74.7 % (ref 43.0–77.0)
PLATELETS: 277 10*3/uL (ref 150.0–400.0)
RBC: 4.38 Mil/uL (ref 4.22–5.81)
RDW: 14 % (ref 11.5–15.5)
WBC: 14 10*3/uL — AB (ref 4.0–10.5)

## 2016-02-08 NOTE — Assessment & Plan Note (Addendum)
Recent CAP and COPD exacerbation: Plan: We will do a follow up CXR today Labs today CBC with diff and BMET We will call you with results. Continue your Proventil inhaler every 4-6 hours as needed for wheezing and shortness of breath. Continue Advair 2 puffs twice daily Rinse your mouth after use. Continue your Spiriva once daily. Dr. Marchelle Gearing and I will evaluate clearance for surgery after reviewing todays CXR and labwork. Keep appointment with Dr. Marchelle Gearing in August. See Synetta Fail on the way out to schedule LDCT scan in August of this month. Please contact office for sooner follow up if symptoms do not improve or worsen or seek emergency care

## 2016-02-08 NOTE — Patient Instructions (Addendum)
We will do a follow up CXR today Labs today CBC with diff and BMET We will call you with results. Continue your Proventil inhaler every 4-6 hours as needed for wheezing and shortness of breath. Continue Advair 2 puffs twice daily Rinse your mouth after use. Continue your Spiriva once daily. Keep appointment with Dr. Marchelle Gearing in August. See Synetta Fail on the way out to schedule LDCT scan in August of this month. Please contact office for sooner follow up if symptoms do not improve or worsen or seek emergency care

## 2016-02-08 NOTE — Progress Notes (Addendum)
History of Present Illness Derek Conner is a 55 y.o. male with COPD, Emphysema and asthma followed by Dr. Marchelle Gearing.   02/08/2016 Hospital Follow Up for COPD exacerbation and Community Acquired Pneumonia;He was treated as an out patient with Levaquin and prednisone but despite this within 2 days he progressed and required admission.He was treated with IV Vantin and Azithromycin and oral steriods in addition to scheduled BD's. He has completed his Vantin and Zithromax and has afew doses of prednisone remaining  since discharge.He states his breathing is better. He does feel the humidity make breathing a bit worse, but it is still much better. He has quit smoking since discharge and is scheduled for Aneurysm surgery on July 24th.with Dr. Titus Dubin. ( Incidental Finding). He is using his rescue inhaler approximately once daily. He denies fever, chest pain,cough, minimal sputum, orthopnea or hemoptysis. Labs and CXR not yet completed since discharge. We will check them today.  [facility] Postperative Pulmonary Risk Score Comment Score  Type of surgery - abd ao aneurysm (27), thoracic (21), neurosurgery / upper abdominal / vascular (21), neck (11) Neck 11  Emergency Surgery - (11) Elective 0  ALbumin < 3 or poor nutritional state - (9) WNM 6/17 0  BUN > 30 -  (8) WNL 6/17 0  Partial or completely dependent functional status - (7) Independent 0  COPD -  (6) Yes 6  Age - 60 to 90 (4), > 70  (6) 55 0  TOTAL  17  Risk Stratifcation scores  - < 10, 11-19, 20-27, 28-40, >40  Low Moderate     CANET Postperative Pulmonary Risk Score Comment Score  Age - <50 (0), 50-80 (3), >80 (16) 55 3  Preoperative pulse ox - >96 (0), 91-95 (8), <90 (24) 98% 0  Respiratory infection in last month - Yes (17) No 0  Preoperative anemia - < 10gm% - Yes (11) No 0  Surgical incision - Upper abdominal (15), Thoracic (24) NA 0  Duration of surgery - <2h (0), 2-3h (16), >3h (23)  0  Emergency Surgery - Yes (8) No     TOTAL  3  Risk Stratification - Low (<26), Intermediate (26-44), High (>45)  Low   Pre-operative respiratory exam: Low- Low Moderate risk. CAP 01/2016 shows resolution per CXR 02/08/2016.  Significant Events:   Admission date: 01/19/2016 Admitting Physician Ozella Rocks, MD  Discharge Date: 01/23/2016   Primary MD Saguier, Kateri Mc  Recommendations for primary care physician for things to follow:   CBC, BMP, 2 view chest x-ray in a week. Outpatient pulmonary follow-up.  Also need outpatient interventional neurology follow-up.    Admission Diagnosis Acute exacerbation of chronic obstructive pulmonary disease (COPD) (HCC) [J44.1]  Discharge Diagnosis Acute exacerbation of chronic obstructive pulmonary disease (COPD) (HCC) [J44.1]   Principal Problem:  CAP (community acquired pneumonia) Active Problems:  COPD with exacerbation (HCC)  Cough syncope  Aneurysm (HCC)  1.Community-acquired pneumonia with mild COPD exacerbation. Clinically much improved, chest x-ray much improved, Had failed outpatient Levaquin treatment and responded very well to oral steroids along with IV Vantin and azithromycin combination, chest x-ray improved he is clinically improved, he has been counseled to quit smoking. He currently does not require any oxygen, will be placed on 3 more days of antibiotics along with a steroid taper, continue home breathing treatments and follow with PCP along with primary pulmonologist in 1-2 weeks. Request PCP to check CBC, BMP and a two-view chest x-ray within a week.  2. COPD exacerbation  mild with ongoing smoking. Plan as above. Counseled to quit smoking.  3. Essential hypertension. On Norvasc, clonidine and ACE inhibitor.  4. Dyslipidemia. Continue home dose statin.  5. Episodes of posttussive syncope. Seen by neuro, EEG Stable, MRI showed an incidental aneurysm, per neuro no further workup. Stable tele, Patient and wife educated in detail  bedside about v posttussive syncope . No loud murmurs, do not suspect any structural heart problems, Echo done & stable.  6. Incidental finding of brain aneurysm. Outpatient follow-up with interventional neurologist Dr. Corliss Skains.   Tests 01/21/2016 CXR IMPRESSION: Slightly decreased right upper lobe opacity is noted suggesting improving pneumonia or subsegmental atelectasis. Continued radiographic follow-up is recommended to ensure resolution or stability. Hyperexpansion of the lungs is noted suggesting chronic obstructive pulmonary disease.  02/08/2016: Follow Up CXR  Cardiac shadow is stable. The lungs are again hyperinflated consistent with COPD. Mild interstitial changes are again noted bilaterally and stable. Small nodule is again noted in the posterior aspect of the right upper lobe stable from previous CT examination. The infiltrative density seen on the recent chest x-rays has resolved. No new focal infiltrate is seen. No bony abnormality is noted.  IMPRESSION: Resolution of previously seen right upper lobe changes.  Stable right upper lobe nodule from prior CT.   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 No additional aneurysm. No evidence of aneurysm rupture on today's study.  No significant carotid or vertebral artery stenosis in the neck. No significant intracranial stenosis.  Apical emphysema bilaterally. There is apical scarring bilaterally. Asymmetric soft tissue density right apex likely represents scarring and is similar to prior chest CT.   Past medical hx Past Medical History  Diagnosis Date  . COPD (chronic obstructive pulmonary disease) (HCC)   . Emphysema lung (HCC)   . Hypertension   . Emphysema of lung (HCC)   . High cholesterol   . Asthma   . CAP (community acquired pneumonia) 01/19/2016  . Pneumonia 1960s  . Chronic bronchitis (HCC)   . Hepatitis A infection ~ 1980  . Rheumatoid arthritis (HCC)      "hands mostly"  ( 01/19/2016)  . Situational depression Dec 11, 2009    "only when my mother died"     Past surgical hx, Family hx, Social hx all reviewed.  Current Outpatient Prescriptions on File Prior to Visit  Medication Sig  . albuterol (PROVENTIL HFA;VENTOLIN HFA) 108 (90 Base) MCG/ACT inhaler Inhale 2 puffs into the lungs every 6 (six) hours as needed for wheezing or shortness of breath.  Marland Kitchen amLODipine (NORVASC) 10 MG tablet TAKE 1 TABLET (10 MG) BY MOUTH DAILY.  Marland Kitchen atorvastatin (LIPITOR) 20 MG tablet Take 1 tablet (20 mg total) by mouth daily.  Marland Kitchen b complex vitamins tablet Take 1 tablet by mouth daily.  . cloNIDine (CATAPRES) 0.1 MG tablet TAKE 1 TABLET (0.1 MG TOTAL) BY MOUTH 2 (TWO) TIMES DAILY.  Marland Kitchen Fluticasone-Salmeterol (ADVAIR DISKUS) 500-50 MCG/DOSE AEPB INHALE 1 PUFF BY MOUTH INTO THE LUNGS 2 TIMES DAILY.  Marland Kitchen ibuprofen (ADVIL,MOTRIN) 200 MG tablet Take 200 mg by mouth every 6 (six) hours as needed.  Marland Kitchen lisinopril (PRINIVIL,ZESTRIL) 10 MG tablet TAKE 1 TABLET (10 MG) BY MOUTH DAILY.  . Multiple Vitamin (MULTIVITAMIN WITH MINERALS) TABS tablet Take 1 tablet by mouth daily.  . predniSONE (DELTASONE) 5 MG tablet Label  & dispense according to the schedule below. 10 Pills PO for 3 days then, 8 Pills PO for 3 days, 6 Pills PO for 3 days,  4 Pills PO for 3 days, 2 Pills PO for 3 days, 1 Pills PO for 3 days, 1/2 Pill  PO for 3 days then STOP. Total 95 pills.  . tiotropium (SPIRIVA HANDIHALER) 18 MCG inhalation capsule PLACE 1 CAPSULE INTO INHALER AND INHALE INTO LUNGS DAILY.   No current facility-administered medications on file prior to visit.     Allergies  Allergen Reactions  . Symbicort [Budesonide-Formoterol Fumarate] Shortness Of Breath, Nausea And Vomiting and Other (See Comments)    heachache  . Dulera [Mometasone Furo-Formoterol Fum]     Headache    Review Of Systems:  Constitutional:   No  weight loss, night sweats,  Fevers, chills, fatigue, or  lassitude.  HEENT:   No  headaches,  Difficulty swallowing,  Tooth/dental problems, or  Sore throat,                No sneezing, itching, ear ache, nasal congestion, post nasal drip,   CV:  No chest pain,  Orthopnea, PND, swelling in lower extremities, anasarca, dizziness, palpitations, syncope.   GI  No heartburn, indigestion, abdominal pain, nausea, vomiting, diarrhea, change in bowel habits, loss of appetite, bloody stools.   Resp: No shortness of breath with exertion or at rest.  No excess mucus, no productive cough,  No non-productive cough,  No coughing up of blood.  No change in color of mucus.  No wheezing.  No chest wall deformity  Skin: no rash or lesions.  GU: no dysuria, change in color of urine, no urgency or frequency.  No flank pain, no hematuria   MS:  No joint pain or swelling.  No decreased range of motion.  No back pain.  Psych:  No change in mood or affect. No depression or anxiety.  No memory loss.   Vital Signs BP 122/78 mmHg  Pulse 75  Ht 6' (1.829 m)  Wt 198 lb (89.812 kg)  BMI 26.85 kg/m2  SpO2 98%   Physical Exam:  General- No distress,  A&Ox3,pleasant ENT: No sinus tenderness, TM clear, pale nasal mucosa, no oral exudate,no post nasal drip, no LAN Cardiac: S1, S2, regular rate and rhythm, no murmur Chest: No wheeze/ rales/ dullness; no accessory muscle use, no nasal flaring, no sternal retractions Abd.: Soft Non-tender Ext: No clubbing cyanosis, edema Neuro:  normal strength Skin: No rashes, warm and dry Psych: normal mood and behavior   Assessment/Plan  CAP (community acquired pneumonia) Recent CAP and COPD exacerbation: Plan: We will do a follow up CXR today Labs today CBC with diff and BMET We will call you with results. Continue your Proventil inhaler every 4-6 hours as needed for wheezing and shortness of breath. Continue Advair 2 puffs twice daily Rinse your mouth after use. Continue your Spiriva once daily. Dr. Marchelle Gearing and I will evaluate clearance for  surgery after reviewing todays CXR and labwork. Keep appointment with Dr. Marchelle Gearing in August. See Synetta Fail on the way out to schedule LDCT scan in August of this month. Please contact office for sooner follow up if symptoms do not improve or worsen or seek emergency care     Aneurysm [facility]) Incidental finding during work up for pneumonia/seizure. Plan: Scheduled for surgical repair 02/26/16 by Dr. Elenore Paddy, NP 02/08/2016  6:08 PM

## 2016-02-08 NOTE — Telephone Encounter (Signed)
I have called Mr. Derek Conner with the results of his chest x-ray and labs done today in the office. I explained that the chest x-ray indicated complete resolution of the pneumonia he was hospitalized for earlier this month. We also discussed the fact that his white blood count remained elevated most likely secondary to the fact he remains on prednisone. He is having surgery for an aneurysm on July 24, and we will suggest in her surgical clearance that labs be done prior to initiating this surgical procedure to assure white blood count has returned to normal. Mr. Derek Conner additionally is going to call the office to make sure his low-dose CT screening of the chest gets scheduled for this month. He verbalized understanding of the above and had no further questions at the end of the call. He has the contact information for the office in the event he has further questions.

## 2016-02-08 NOTE — Assessment & Plan Note (Signed)
Incidental finding during work up for pneumonia/seizure. Plan: Scheduled for surgical repair 02/26/16 by Dr. Titus Dubin

## 2016-02-09 ENCOUNTER — Other Ambulatory Visit (HOSPITAL_COMMUNITY): Payer: Self-pay | Admitting: Interventional Radiology

## 2016-02-09 DIAGNOSIS — I671 Cerebral aneurysm, nonruptured: Secondary | ICD-10-CM

## 2016-02-15 ENCOUNTER — Other Ambulatory Visit: Payer: Self-pay | Admitting: Radiology

## 2016-02-15 MED FILL — CLOPIDOGREL 75 MG TABLET: 75 | 30 days supply | Qty: 30 | Fill #0

## 2016-02-16 NOTE — Pre-Procedure Instructions (Signed)
    Derek Conner  02/16/2016     Your procedure is scheduled on Monday, July 24.  Report to [facility] Admitting at 6:00 AM                 Your surgery or procedure is scheduled for 8:30 AM    Call this number if you have problems the morning of surgery:[phone]   Remember:  Do not eat food or drink liquids after midnight Sunday, July 23.  Take these medicines the morning of surgery with A SIP OF WATER :Amlodipine, Aspirin, Atorvastin, Clonidine,  Plavix.               Stop Aspirin and Vitamins, do not take any Herbal Medications.   Do not wear jewelry, make-up or nail polish.  Do not wear lotions, powders, or perfumes.   Do not shave 48 hours prior to surgery.    Do not bring valuables to the hospital.  [facility] is not responsible for any belongings or valuables.  Contacts, dentures or bridgework may not be worn into surgery.  Leave your suitcase in the car.  After surgery it may be brought to your room.  For patients admitted to the hospital, discharge time will be determined by your treatment team.  Special instructions:  [place] - Preparing For Surgery.    Please read over the following fact sheets that you were given: Review  [place] - Preparing For Surgery.

## 2016-02-16 NOTE — Pre-Procedure Instructions (Signed)
    Derek Conner  02/16/2016     Your procedure is scheduled on Monday, July 24.  Report to [facility] Admitting at 6:00 AM                 Your surgery or procedure is scheduled for 8:30 AM    Call this number if you have problems the morning of surgery:[phone]   Remember:  Do not eat food or drink liquids after midnight Sunday, July 23.  Take these medicines the morning of surgery with A SIP OF WATER : Amlodipine, Aspirin, Clonidine, Plavix.  May use inhalers, please bring Albuterol inhaler to the hospital with you.              1 Week prior to surgery STOP taking , Aspirin Products (Goody Powder, Excedrin Migraine), Ibuprofen (Advil), Naproxen (Aleve), Viatiams and Herbal Products (ie Fish Oil)   Do not wear jewelry, make-up or nail polish.  Do not wear lotions, powders, or perfumes.     Men may shave face and neck.  Do not bring valuables to the hospital.  [facility] is not responsible for any belongings or valuables.  Contacts, dentures or bridgework may not be worn into surgery.  Leave your suitcase in the car.  After surgery it may be brought to your room.  For patients admitted to the hospital, discharge time will be determined by your treatment team.  Special instructions:  Review  [place] - Preparing For Surgery.

## 2016-02-19 ENCOUNTER — Encounter (HOSPITAL_COMMUNITY)
Admission: RE | Admit: 2016-02-19 | Discharge: 2016-02-19 | Disposition: A | Discharge status: Home / Self Care | Payer: 59 | Source: Ambulatory Visit | Attending: Interventional Radiology | Admitting: Interventional Radiology

## 2016-02-19 ENCOUNTER — Encounter (HOSPITAL_COMMUNITY): Payer: Self-pay

## 2016-02-19 DIAGNOSIS — Z01812 Encounter for preprocedural laboratory examination: Secondary | ICD-10-CM | POA: Diagnosis not present

## 2016-02-19 LAB — COMPREHENSIVE METABOLIC PANEL
ALT: 44 U/L (ref 17–63)
AST: 30 U/L (ref 15–41)
Albumin: 4 g/dL (ref 3.5–5.0)
Alkaline Phosphatase: 62 U/L (ref 38–126)
Anion gap: 4 — ABNORMAL LOW (ref 5–15)
BUN: 19 mg/dL (ref 6–20)
CHLORIDE: 109 mmol/L (ref 101–111)
CO2: 25 mmol/L (ref 22–32)
Calcium: 9.6 mg/dL (ref 8.9–10.3)
Creatinine, Ser: 1.23 mg/dL (ref 0.61–1.24)
GFR calc Af Amer: >60 mL/min (ref >60)
GFR calc non Af Amer: >60 mL/min (ref >60)
GLUCOSE: 104 mg/dL — AB (ref 65–99)
Potassium: 4.3 mmol/L (ref 3.5–5.1)
SODIUM: 138 mmol/L (ref 135–145)
Total Bilirubin: 0.9 mg/dL (ref 0.3–1.2)
Total Protein: 6.8 g/dL (ref 6.5–8.1)

## 2016-02-19 LAB — CBC WITH DIFFERENTIAL/PLATELET
BASOS ABS: 0 10*3/uL (ref 0.0–0.1)
Basophils Relative: 0 %
Eosinophils Absolute: 0.2 10*3/uL (ref 0.0–0.7)
Eosinophils Relative: 3 %
HCT: 40.4 % (ref 39.0–52.0)
Hemoglobin: 12.8 g/dL — ABNORMAL LOW (ref 13.0–17.0)
LYMPHS ABS: 2.3 10*3/uL (ref 0.7–4.0)
Lymphocytes Relative: 32 %
MCH: 28.5 pg (ref 26.0–34.0)
MCHC: 31.7 g/dL (ref 30.0–36.0)
MCV: 90 fL (ref 78.0–100.0)
MONO ABS: 0.6 10*3/uL (ref 0.1–1.0)
MONOS PCT: 9 %
NEUTROS ABS: 4.2 10*3/uL (ref 1.7–7.7)
NEUTROS PCT: 56 %
PLATELETS: 263 10*3/uL (ref 150–400)
RBC: 4.49 MIL/uL (ref 4.22–5.81)
RDW: 13.7 % (ref 11.5–15.5)
WBC: 7.4 10*3/uL (ref 4.0–10.5)

## 2016-02-19 LAB — PROTIME-INR
INR: 0.97 (ref 0.00–1.49)
Prothrombin Time: 13.1 seconds (ref 11.6–15.2)

## 2016-02-19 LAB — APTT: aPTT: 30 seconds (ref 24–37)

## 2016-02-19 NOTE — Pre-Procedure Instructions (Signed)
    Derek Conner  02/19/2016     Your procedure is scheduled on Monday, July 24.  Report to [facility] Admitting at 6:00 AM                 Your surgery or procedure is scheduled for 8:30 AM    Call this number if you have problems the morning of surgery:[phone]   Remember:  Do not eat food or drink liquids after midnight Sunday, July 23.  Take these medicines the morning of surgery with A SIP OF WATER : Amlodipine, Aspirin, Clonidine, Clopidogrel (Plavix), may use inhalers.   Do not wear jewelry, make-up or nail polish.  Do not wear lotions, powders, or perfumes.    Do not shave 48 hours prior to surgery.    Do not bring valuables to the hospital.  [facility] is not responsible for any belongings or valuables.  Contacts, dentures or bridgework may not be worn into surgery.  Leave your suitcase in the car.  After surgery it may be brought to your room.  For patients admitted to the hospital, discharge time will be determined by your treatment team.  Special instructions:  Review  [place] - Preparing For Surgery.  Please read over the following fact sheets that you were given. Review  [place] - Preparing For Surgery.

## 2016-02-19 NOTE — Pre-Procedure Instructions (Signed)
    Derek Conner  02/19/2016     Your procedure is scheduled on Monday, July 24.  Report to [facility] Admitting at 6:00 AM                 Your surgery or procedure is scheduled for 8:30 AM    Call this number if you have problems the morning of surgery:[phone]   Remember:  Do not eat food or drink liquids after midnight Sunday, July 23.  Take these medicines the morning of surgery with A SIP OF WATER : Amodlipine, Aspirin,Clonipine, Plavix.  May use inhalers, please bring Albuterol.               1 Week prior to surgery STOP: Aspirin Products (Goody Powder, Excedrin Migraine), Ibuprofen (Advil), Naproxen (Aleve), Viatiams and Herbal Products (ie Fish Oil).    Do not wear jewelry, make-up or nail polish.  Do not wear lotions, powders, or perfumes.    Do not shave 48 hours prior to surgery.    Do not bring valuables to the hospital.  [facility] is not responsible for any belongings or valuables.  Contacts, dentures or bridgework may not be worn into surgery.  Leave your suitcase in the car.  After surgery it may be brought to your room.  For patients admitted to the hospital, discharge time will be determined by your treatment team.  Special instructions: Review  [place] - Preparing For Surgery.

## 2016-02-19 NOTE — Pre-Procedure Instructions (Signed)
    Derek Conner  02/19/2016     Your procedure is scheduled on Monday, July 24.  Report to [facility] Admitting at 6:00 AM                 Your surgery or procedure is scheduled for 8:30 AM    Call this number if you have problems the morning of surgery:[phone]   Remember:  Do not eat food or drink liquids after midnight Sunday, July 23.  Take these medicines the morning of surgery with A SIP OF WATER : Amlodipine, Aspirin, Plavix, Clonidine.  May use inhalers, please bring Albuterol inhaler to the hospital with you.   Do not wear jewelry, make-up or nail polish.  Do not wear lotions, powders, or perfumes.    Men may shave face and neck.  Do not bring valuables to the hospital.  [facility] is not responsible for any belongings or valuables.  Contacts, dentures or bridgework may not be worn into surgery.  Leave your suitcase in the car.  After surgery it may be brought to your room.  For patients admitted to the hospital, discharge time will be determined by your treatment team.  Special instructions: Review  [place] - Preparing For Surgery.

## 2016-02-26 ENCOUNTER — Ambulatory Visit (HOSPITAL_COMMUNITY): Admission: RE | Admit: 2016-02-26 | Payer: 59 | Source: Ambulatory Visit

## 2016-02-28 ENCOUNTER — Other Ambulatory Visit: Payer: Self-pay | Admitting: Physician Assistant

## 2016-02-28 MED ORDER — NIMODIPINE 30 MG PO CAPS: 0.0000 mg | ORAL_CAPSULE | ORAL | Status: DC

## 2016-02-28 MED ORDER — CLOPIDOGREL BISULFATE 75 MG PO TABS: 75.0000 mg | ORAL_TABLET | ORAL | Status: DC

## 2016-02-28 MED ORDER — CEFAZOLIN SODIUM-DEXTROSE 2-4 GM/100ML-% IV SOLN: 2.0000 g | INTRAVENOUS | Status: DC

## 2016-02-28 MED ORDER — ASPIRIN EC 325 MG PO TBEC: 325.0000 mg | DELAYED_RELEASE_TABLET | ORAL | Status: DC

## 2016-02-28 MED ORDER — SODIUM CHLORIDE 0.9 % IV SOLN: INTRAVENOUS | Status: DC

## 2016-02-28 MED FILL — SPIRIVA 18 MCG CP-HANDIHALE: 18 | 30 days supply | Qty: 30 | Fill #1

## 2016-02-28 NOTE — Progress Notes (Signed)
Pt notified of time arrival for surgery tomorrow. He is aware that he needs to be here at 6:00 AM.

## 2016-02-29 ENCOUNTER — Encounter (HOSPITAL_COMMUNITY)
Admission: RE | Disposition: A | Discharge status: Home / Self Care | Payer: Self-pay | Source: Ambulatory Visit | Attending: Interventional Radiology

## 2016-02-29 ENCOUNTER — Ambulatory Visit (HOSPITAL_COMMUNITY): Payer: 59 | Admitting: Certified Registered Nurse Anesthetist

## 2016-02-29 ENCOUNTER — Ambulatory Visit (HOSPITAL_COMMUNITY): Admission: RE | Admit: 2016-02-29 | Payer: 59 | Source: Ambulatory Visit

## 2016-02-29 ENCOUNTER — Ambulatory Visit (HOSPITAL_COMMUNITY)
Admission: RE | Admit: 2016-02-29 | Discharge: 2016-02-29 | Disposition: A | Discharge status: Home / Self Care | Payer: 59 | Source: Ambulatory Visit | Attending: Interventional Radiology | Admitting: Interventional Radiology

## 2016-02-29 ENCOUNTER — Encounter (HOSPITAL_COMMUNITY): Payer: Self-pay | Admitting: *Deleted

## 2016-02-29 DIAGNOSIS — J449 Chronic obstructive pulmonary disease, unspecified: Secondary | ICD-10-CM | POA: Diagnosis not present

## 2016-02-29 DIAGNOSIS — Z539 Procedure and treatment not carried out, unspecified reason: Secondary | ICD-10-CM | POA: Diagnosis not present

## 2016-02-29 DIAGNOSIS — Z7951 Long term (current) use of inhaled steroids: Secondary | ICD-10-CM | POA: Insufficient documentation

## 2016-02-29 DIAGNOSIS — Z79899 Other long term (current) drug therapy: Secondary | ICD-10-CM | POA: Insufficient documentation

## 2016-02-29 DIAGNOSIS — Z7982 Long term (current) use of aspirin: Secondary | ICD-10-CM | POA: Insufficient documentation

## 2016-02-29 DIAGNOSIS — Z87891 Personal history of nicotine dependence: Secondary | ICD-10-CM | POA: Diagnosis not present

## 2016-02-29 DIAGNOSIS — I10 Essential (primary) hypertension: Secondary | ICD-10-CM | POA: Insufficient documentation

## 2016-02-29 DIAGNOSIS — Z7902 Long term (current) use of antithrombotics/antiplatelets: Secondary | ICD-10-CM | POA: Diagnosis not present

## 2016-02-29 DIAGNOSIS — I671 Cerebral aneurysm, nonruptured: Secondary | ICD-10-CM | POA: Insufficient documentation

## 2016-02-29 HISTORY — PX: RADIOLOGY WITH ANESTHESIA: SHX6223

## 2016-02-29 LAB — PLATELET INHIBITION P2Y12: Platelet Function  P2Y12: 180 [PRU] — ABNORMAL LOW (ref 194–418)

## 2016-02-29 SURGERY — RADIOLOGY WITH ANESTHESIA
Anesthesia: General

## 2016-02-29 MED ORDER — NIMODIPINE 30 MG PO CAPS
ORAL_CAPSULE | ORAL | Status: AC
  Filled 2016-02-29: qty 1

## 2016-02-29 MED ORDER — LACTATED RINGERS IV SOLN: INTRAVENOUS | Status: DC

## 2016-02-29 MED ORDER — ASPIRIN 81 MG PO CHEW
CHEWABLE_TABLET | ORAL | Status: AC
  Filled 2016-02-29: qty 3

## 2016-02-29 NOTE — Progress Notes (Signed)
IV was D/CD by anesthesia.

## 2016-02-29 NOTE — Anesthesia Preprocedure Evaluation (Addendum)
Anesthesia Evaluation  Patient identified by MRN, date of birth, ID band Patient awake    Reviewed: Allergy & Precautions, NPO status , Patient's Chart, lab work & pertinent test results  Airway Mallampati: I  TM Distance: >3 FB     Dental  (+) Edentulous Upper, Edentulous Lower, Dental Advisory Given   Pulmonary asthma , pneumonia, resolved, COPD,  COPD inhaler, former smoker,    breath sounds clear to auscultation       Cardiovascular hypertension, Pt. on medications  Rhythm:Regular Rate:Normal     Neuro/Psych Depression    GI/Hepatic (+) Hepatitis -  Endo/Other    Renal/GU      Musculoskeletal  (+) Arthritis ,   Abdominal (+)  Abdomen: soft. Bowel sounds: normal.  Peds  Hematology   Anesthesia Other Findings   Reproductive/Obstetrics                           Lab Results  Component Value Date   WBC 7.4 02/19/2016   HGB 12.8 (L) 02/19/2016   HCT 40.4 02/19/2016   MCV 90.0 02/19/2016   PLT 263 02/19/2016   Lab Results  Component Value Date   INR 0.97 02/19/2016   INR 0.92 12/03/2013   Lab Results  Component Value Date   CREATININE 1.23 02/19/2016   BUN 19 02/19/2016   NA 138 02/19/2016   K 4.3 02/19/2016   CL 109 02/19/2016   CO2 25 02/19/2016   01/2016 EKG: normal sinus rhythm.   Anesthesia Physical Anesthesia Plan  ASA: III  Anesthesia Plan: General   Post-op Pain Management:    Induction: Intravenous  Airway Management Planned: Oral ETT  Additional Equipment: Arterial line  Intra-op Plan:   Post-operative Plan: Extubation in OR  Informed Consent: I have reviewed the patients History and Physical, chart, labs and discussed the procedure including the risks, benefits and alternatives for the proposed anesthesia with the patient or authorized representative who has indicated his/her understanding and acceptance.   Dental advisory given  Plan Discussed  with: CRNA  Anesthesia Plan Comments:         Anesthesia Quick Evaluation

## 2016-03-01 ENCOUNTER — Encounter (HOSPITAL_COMMUNITY): Payer: Self-pay | Admitting: Interventional Radiology

## 2016-03-07 ENCOUNTER — Other Ambulatory Visit (HOSPITAL_COMMUNITY)
Admission: AD | Admit: 2016-03-07 | Discharge: 2016-03-07 | Disposition: A | Discharge status: Home / Self Care | Payer: 59 | Source: Ambulatory Visit | Attending: Interventional Radiology | Admitting: Interventional Radiology

## 2016-03-07 DIAGNOSIS — Z0189 Encounter for other specified special examinations: Secondary | ICD-10-CM | POA: Insufficient documentation

## 2016-03-07 LAB — PLATELET INHIBITION P2Y12: Platelet Function  P2Y12: 178 [PRU] — ABNORMAL LOW (ref 194–418)

## 2016-03-07 MED FILL — CLOPIDOGREL 75 MG TABLET: 75 | 30 days supply | Qty: 30 | Fill #1

## 2016-03-08 ENCOUNTER — Telehealth (HOSPITAL_COMMUNITY): Payer: Self-pay | Admitting: Interventional Radiology

## 2016-03-08 MED FILL — BRILINTA 90 MG TABLET: 90 | 30 days supply | Qty: 60 | Fill #0

## 2016-03-08 NOTE — Telephone Encounter (Signed)
Called pt, left VM that Deveshwar had reviewed his P2Y12 results and wanted pt to DC Plavix 75mg  b.i.d. He is changing the patient's prescription to Brilinta 90mg  1 b.i.d. This will be called in to patient's pharmacy today 03/08/16. Pt will recheck a P2Y12 study next wk. JM

## 2016-03-15 ENCOUNTER — Other Ambulatory Visit: Payer: Self-pay | Admitting: Radiology

## 2016-03-15 ENCOUNTER — Telehealth (HOSPITAL_COMMUNITY): Payer: Self-pay | Admitting: *Deleted

## 2016-03-15 ENCOUNTER — Telehealth: Payer: Self-pay

## 2016-03-15 ENCOUNTER — Ambulatory Visit (HOSPITAL_BASED_OUTPATIENT_CLINIC_OR_DEPARTMENT_OTHER)
Admission: RE | Admit: 2016-03-15 | Discharge: 2016-03-15 | Disposition: A | Discharge status: Home / Self Care | Payer: 59 | Source: Ambulatory Visit | Attending: Acute Care | Admitting: Acute Care

## 2016-03-15 ENCOUNTER — Other Ambulatory Visit (HOSPITAL_COMMUNITY)
Admission: AD | Admit: 2016-03-15 | Discharge: 2016-03-15 | Disposition: A | Discharge status: Home / Self Care | Payer: 59 | Source: Ambulatory Visit | Attending: Interventional Radiology | Admitting: Interventional Radiology

## 2016-03-15 ENCOUNTER — Telehealth: Payer: Self-pay | Admitting: Acute Care

## 2016-03-15 DIAGNOSIS — F17210 Nicotine dependence, cigarettes, uncomplicated: Principal | ICD-10-CM

## 2016-03-15 DIAGNOSIS — J439 Emphysema, unspecified: Secondary | ICD-10-CM | POA: Insufficient documentation

## 2016-03-15 DIAGNOSIS — Z87891 Personal history of nicotine dependence: Secondary | ICD-10-CM | POA: Diagnosis not present

## 2016-03-15 DIAGNOSIS — I700 Atherosclerosis of aorta: Secondary | ICD-10-CM | POA: Insufficient documentation

## 2016-03-15 DIAGNOSIS — R911 Solitary pulmonary nodule: Secondary | ICD-10-CM | POA: Insufficient documentation

## 2016-03-15 LAB — PLATELET INHIBITION P2Y12: Platelet Function  P2Y12: 3 [PRU] — ABNORMAL LOW (ref 194–418)

## 2016-03-15 NOTE — Telephone Encounter (Signed)
I will call the patient with the results and let him know he will need a follow up CT in 6 months. Thanks so much!!

## 2016-03-15 NOTE — Telephone Encounter (Signed)
Spoke with Erskine Squibb at [facility], states that the Radiologist made an addendum to low dose ct chest lung cancer screening- addendum was made to change lung rads category from a category 2 to a category 3.    Forwarding to SG to make aware.  Please advise on recs.  Thanks!

## 2016-03-15 NOTE — Telephone Encounter (Signed)
I have called the results of the low-dose CT screening follow-up to Mr. Derek Conner. I explained to him that his repeat lung scan was read as a Lung  RADS 3, nodules that are probably benign findings, short term follow up suggested: includes nodules with a low likelihood of becoming a clinically active cancer. Radiology recommends a 6 month repeat LDCT follow up. I explained that we will schedule him for a follow-up CT scan in February 2018. He verbalized understanding of the above and had no further questions at the time the phone call was completed. He has my contact information in the event he has questions in the future.

## 2016-03-15 NOTE — Telephone Encounter (Signed)
Called and spoke with patient regarding P2Y12 results.  Per Dr. Corliss Skains he is to take Brilinta 90mg  in am and Brilinta 45mg  in the pam.  Cont taking ASA 81mg  daily.  Pt voiced understanding

## 2016-03-20 ENCOUNTER — Other Ambulatory Visit: Payer: Self-pay | Admitting: Radiology

## 2016-03-20 MED ORDER — NIMODIPINE 30 MG PO CAPS: 0.0000 mg | ORAL_CAPSULE | ORAL | Status: AC

## 2016-03-20 MED ORDER — ASPIRIN 325 MG PO TBEC: 325.0000 mg | DELAYED_RELEASE_TABLET | ORAL | Status: AC

## 2016-03-20 MED ORDER — SODIUM CHLORIDE 0.9 % IV SOLN: INTRAVENOUS | Status: AC

## 2016-03-20 MED ORDER — DEXTROSE 5 % IV SOLN: 2.0000 g | INTRAVENOUS | Status: AC

## 2016-03-26 ENCOUNTER — Encounter (HOSPITAL_COMMUNITY): Payer: Self-pay

## 2016-03-26 ENCOUNTER — Other Ambulatory Visit: Payer: 59

## 2016-03-26 ENCOUNTER — Encounter: Payer: Self-pay | Admitting: Internal Medicine

## 2016-03-26 ENCOUNTER — Encounter (HOSPITAL_COMMUNITY)
Admission: RE | Admit: 2016-03-26 | Discharge: 2016-03-26 | Disposition: A | Discharge status: Home / Self Care | Payer: 59 | Source: Ambulatory Visit | Attending: Interventional Radiology | Admitting: Interventional Radiology

## 2016-03-26 ENCOUNTER — Telehealth (HOSPITAL_COMMUNITY): Payer: Self-pay | Admitting: Radiology

## 2016-03-26 ENCOUNTER — Ambulatory Visit (INDEPENDENT_AMBULATORY_CARE_PROVIDER_SITE_OTHER): Payer: 59 | Admitting: Internal Medicine

## 2016-03-26 VITALS — BP 126/72 | HR 83 | Ht 72.0 in | Wt 196.2 lb

## 2016-03-26 DIAGNOSIS — J439 Emphysema, unspecified: Secondary | ICD-10-CM

## 2016-03-26 DIAGNOSIS — F17210 Nicotine dependence, cigarettes, uncomplicated: Secondary | ICD-10-CM | POA: Diagnosis not present

## 2016-03-26 DIAGNOSIS — J449 Chronic obstructive pulmonary disease, unspecified: Secondary | ICD-10-CM | POA: Diagnosis not present

## 2016-03-26 DIAGNOSIS — I10 Essential (primary) hypertension: Secondary | ICD-10-CM | POA: Diagnosis not present

## 2016-03-26 DIAGNOSIS — M069 Rheumatoid arthritis, unspecified: Secondary | ICD-10-CM | POA: Diagnosis not present

## 2016-03-26 DIAGNOSIS — F17200 Nicotine dependence, unspecified, uncomplicated: Secondary | ICD-10-CM

## 2016-03-26 DIAGNOSIS — E7800 Pure hypercholesterolemia, unspecified: Secondary | ICD-10-CM | POA: Diagnosis not present

## 2016-03-26 DIAGNOSIS — Z720 Tobacco use: Secondary | ICD-10-CM

## 2016-03-26 DIAGNOSIS — Z8261 Family history of arthritis: Secondary | ICD-10-CM | POA: Diagnosis not present

## 2016-03-26 DIAGNOSIS — Z823 Family history of stroke: Secondary | ICD-10-CM | POA: Diagnosis not present

## 2016-03-26 DIAGNOSIS — I671 Cerebral aneurysm, nonruptured: Secondary | ICD-10-CM | POA: Diagnosis not present

## 2016-03-26 DIAGNOSIS — Z8249 Family history of ischemic heart disease and other diseases of the circulatory system: Secondary | ICD-10-CM | POA: Diagnosis not present

## 2016-03-26 DIAGNOSIS — Z01811 Encounter for preprocedural respiratory examination: Secondary | ICD-10-CM | POA: Insufficient documentation

## 2016-03-26 LAB — COMPREHENSIVE METABOLIC PANEL
ALBUMIN: 3.9 g/dL (ref 3.5–5.0)
ALT: 22 U/L (ref 17–63)
ANION GAP: 8 (ref 5–15)
AST: 24 U/L (ref 15–41)
Alkaline Phosphatase: 65 U/L (ref 38–126)
BILIRUBIN TOTAL: 0.8 mg/dL (ref 0.3–1.2)
BUN: 13 mg/dL (ref 6–20)
CO2: 20 mmol/L — AB (ref 22–32)
Calcium: 9.1 mg/dL (ref 8.9–10.3)
Chloride: 106 mmol/L (ref 101–111)
Creatinine, Ser: 1.36 mg/dL — ABNORMAL HIGH (ref 0.61–1.24)
GFR calc Af Amer: >60 mL/min (ref >60)
GFR calc non Af Amer: 57 mL/min — ABNORMAL LOW (ref >60)
GLUCOSE: 137 mg/dL — AB (ref 65–99)
POTASSIUM: 4 mmol/L (ref 3.5–5.1)
SODIUM: 134 mmol/L — AB (ref 135–145)
TOTAL PROTEIN: 6.6 g/dL (ref 6.5–8.1)

## 2016-03-26 LAB — CBC WITH DIFFERENTIAL/PLATELET
BASOS PCT: 1 %
Basophils Absolute: 0.1 10*3/uL (ref 0.0–0.1)
EOS ABS: 0.6 10*3/uL (ref 0.0–0.7)
Eosinophils Relative: 5 %
HCT: 40.4 % (ref 39.0–52.0)
Hemoglobin: 12.9 g/dL — ABNORMAL LOW (ref 13.0–17.0)
Lymphocytes Relative: 23 %
Lymphs Abs: 2.8 10*3/uL (ref 0.7–4.0)
MCH: 28.9 pg (ref 26.0–34.0)
MCHC: 31.9 g/dL (ref 30.0–36.0)
MCV: 90.4 fL (ref 78.0–100.0)
MONO ABS: 0.8 10*3/uL (ref 0.1–1.0)
MONOS PCT: 7 %
Neutro Abs: 7.9 10*3/uL — ABNORMAL HIGH (ref 1.7–7.7)
Neutrophils Relative %: 64 %
PLATELETS: 274 10*3/uL (ref 150–400)
RBC: 4.47 MIL/uL (ref 4.22–5.81)
RDW: 13.4 % (ref 11.5–15.5)
WBC: 12.2 10*3/uL — ABNORMAL HIGH (ref 4.0–10.5)

## 2016-03-26 LAB — PROTIME-INR
INR: 0.99
Prothrombin Time: 13.1 seconds (ref 11.4–15.2)

## 2016-03-26 LAB — APTT: aPTT: 31 seconds (ref 24–36)

## 2016-03-26 LAB — PLATELET INHIBITION P2Y12: Platelet Function  P2Y12: 17 [PRU] — ABNORMAL LOW (ref 194–418)

## 2016-03-26 MED ORDER — FLUTICASONE FUROATE-VILANTEROL 100-25 MCG/INH IN AEPB: 1.0000 | INHALATION_SPRAY | Freq: Every day | RESPIRATORY_TRACT | 0 refills | Status: DC

## 2016-03-26 MED FILL — SPIRIVA 18 MCG CP-HANDIHALE: 18 | 90 days supply | Qty: 90 | Fill #0

## 2016-03-26 NOTE — Telephone Encounter (Signed)
Called pt, told him per Dr. Corliss Skains to HOLD his dose of 45mg  Brilinita tonight and his 90mg  dose in the morning prior to his procedure. He will come in as scheduled in the morning and will get a P2Y12 ONLY first to see if we can proceed. Pt states understanding. JM

## 2016-03-26 NOTE — Pre-Procedure Instructions (Signed)
Derek Conner  03/26/2016      [facility] - [place], [place] - [address] [place] [address] Phone: [phone] Fax: [phone]  [facility] - [place], [place] - [address] [place] [address] Phone: [phone] Fax: [phone]    Your procedure is scheduled on   Wednesday 03/27/16  Report to [facility] Admitting at 700 A.M.  Call this number if you have problems the morning of surgery:  [phone]   Remember:  Do not eat food or drink liquids after midnight.  Take these medicines the morning of surgery with A SIP OF WATER  ALBUTEROL INHALER, AMLODIPINE (NORVASC), CLONIDINE, BREO- ELLIPTA INHALER, ADVAIR INHALER, SPIRIVA INHALER   Do not wear jewelry, make-up or nail polish.  Do not wear lotions, powders, or perfumes.  You may wear deoderant.  Do not shave 48 hours prior to surgery.  Men may shave face and neck.  Do not bring valuables to the hospital.  [facility] is not responsible for any belongings or valuables.  Contacts, dentures or bridgework may not be worn into surgery.  Leave your suitcase in the car.  After surgery it may be brought to your room.  For patients admitted to the hospital, discharge time will be determined by your treatment team.  Patients discharged the day of surgery will not be allowed to drive home.   Name and phone number of your driver:    Special instructions:  [place] - Preparing for Surgery  Before surgery, you can play an important role.  Because skin is not sterile, your skin needs to be as free of germs as possible.  You can reduce the number of germs on you skin by washing with CHG (chlorahexidine gluconate) soap before surgery.  CHG is an antiseptic cleaner which kills germs and bonds with the skin to continue killing germs even after washing.  Please DO NOT use if you have an allergy to CHG  or antibacterial soaps.  If your skin becomes reddened/irritated stop using the CHG and inform your nurse when you arrive at Short Stay.  Do not shave (including legs and underarms) for at least 48 hours prior to the first CHG shower.  You may shave your face.  Please follow these instructions carefully:   1.  Shower with CHG Soap the night before surgery and the                                morning of Surgery.  2.  If you choose to wash your hair, wash your hair first as usual with your       normal shampoo.  3.  After you shampoo, rinse your hair and body thoroughly to remove the                      Shampoo.  4.  Use CHG as you would any other liquid soap.  You can apply chg directly       to the skin and wash gently with scrungie or a clean washcloth.  5.  Apply the CHG Soap to your body ONLY FROM THE NECK DOWN.        Do not use on open wounds or open sores.  Avoid contact with your eyes,  ears, mouth and genitals (private parts).  Wash genitals (private parts)       with your normal soap.  6.  Wash thoroughly, paying special attention to the area where your surgery        will be performed.  7.  Thoroughly rinse your body with warm water from the neck down.  8.  DO NOT shower/wash with your normal soap after using and rinsing off       the CHG Soap.  9.  Pat yourself dry with a clean towel.            10.  Wear clean pajamas.            11.  Place clean sheets on your bed the night of your first shower and do not        sleep with pets.  Day of Surgery  Do not apply any lotions/deoderants the morning of surgery.  Please wear clean clothes to the hospital/surgery center.    Please read over the following fact sheets that you were given. Surgical Site Infection Prevention

## 2016-03-26 NOTE — Progress Notes (Signed)
Subjective:     Patient ID: Derek Conner, male   DOB: September 11, 1960, 55 y.o.   MRN: [id number]  PCP Saguier, Ramon Dredge, PA-C  HPI   OV ,03/26/2016  Chief Complaint  Patient presents with  . Follow-up    pt c/o stable sob with exertion, prod cough with brown mucus.     Follow-up severe COPD and this [name]. Last PFTs 2015  I personally have not seen him since March 2016. In the interim in June 2017 he got admitted for community acquired pneumonia. He had a follow-up low dose CT chest lung cancer screen in July 2017 with my nurse practitioner Maralyn Sago. He is lung RADS 3 and repeat CT chest in 6 months. During the admission for pneumonia cough syncope was noted and his intracranial aneurysm. He is due for coiling by Dr. Kerby Nora tomorrow. Currently reports his COPD stable. He started smoking again. He is on triple inhaler therapy Spiriva and Advair. He is willing to try a switch to Brio as long as he can afford it. He wants samples. He is refused flu shot and vaccines based on his belief. Review of the chart shows his alpha-1 has not been tested and he admits to having some "Caucasian blood in me"]     has a past medical history of Asthma; CAP (community acquired pneumonia) (01/19/2016); Chronic bronchitis (HCC); COPD (chronic obstructive pulmonary disease) (HCC); Emphysema lung (HCC); Emphysema of lung (HCC); Hepatitis A infection (~ 1980); High cholesterol; Hypertension; Pneumonia (1960s); Rheumatoid arthritis (HCC); and Situational depression (2011).   reports that he has been smoking Cigarettes.  He has a 40.00 pack-year smoking history. He has never used smokeless tobacco.  Past Surgical History:  Procedure Laterality Date  . APPENDECTOMY  1980's  . CHEST TUBE INSERTION Left 1990's   "lung collapsed"  . CYSTECTOMY Left 1960's   "wrist"  . FOOT FRACTURE SURGERY Left ~ 2005   "it was crushed"  . RADIOLOGY WITH ANESTHESIA N/A 02/29/2016   Procedure: EMBOLIZATION      (RADIOLOGY WITH ANESTHESIA);  Surgeon: Julieanne Cotton, MD;  Location: [facility] OR;  Service: Radiology;  Laterality: N/A;  . VIDEO BRONCHOSCOPY Bilateral 12/06/2013   Procedure: VIDEO BRONCHOSCOPY WITHOUT FLUORO;  Surgeon: Alyson Reedy, MD;  Location: [facility] ENDOSCOPY;  Service: Cardiopulmonary;  Laterality: Bilateral;    Allergies  Allergen Reactions  . Symbicort [Budesonide-Formoterol Fumarate] Shortness Of Breath, Nausea And Vomiting and Other (See Comments)    heachache  . Dulera [Mometasone Furo-Formoterol Fum] Other (See Comments)    Headache     Immunization History  Administered Date(s) Administered  . Tdap 08/29/2015    Family History  Problem Relation Age of Onset  . Cancer Mother   . Arthritis Mother   . Stroke Father   . Hypertension Father   . Colon cancer Neg Hx      Current Outpatient Prescriptions:  .  albuterol (PROVENTIL HFA;VENTOLIN HFA) 108 (90 Base) MCG/ACT inhaler, Inhale 2 puffs into the lungs every 6 (six) hours as needed for wheezing or shortness of breath., Disp: 3 Inhaler, Rfl: 3 .  amLODipine (NORVASC) 10 MG tablet, TAKE 1 TABLET (10 MG) BY MOUTH DAILY., Disp: 90 tablet, Rfl: 3 .  aspirin EC 81 MG tablet, Take 81 mg by mouth daily., Disp: , Rfl:  .  atorvastatin (LIPITOR) 20 MG tablet, Take 1 tablet (20 mg total) by mouth daily., Disp: 30 tablet, Rfl: 2 .  cloNIDine (CATAPRES) 0.1 MG tablet, TAKE 1 TABLET (0.1 MG TOTAL)  BY MOUTH 2 (TWO) TIMES DAILY., Disp: 180 tablet, Rfl: 3 .  Fluticasone-Salmeterol (ADVAIR DISKUS) 500-50 MCG/DOSE AEPB, INHALE 1 PUFF BY MOUTH INTO THE LUNGS 2 TIMES DAILY., Disp: 3 each, Rfl: 3 .  lisinopril (PRINIVIL,ZESTRIL) 10 MG tablet, TAKE 1 TABLET (10 MG) BY MOUTH DAILY., Disp: 90 tablet, Rfl: 3 .  Multiple Vitamin (MULTIVITAMIN WITH MINERALS) TABS tablet, Take 1 tablet by mouth daily., Disp: 30 tablet, Rfl: 0 .  ticagrelor (BRILINTA) 90 MG TABS tablet, Take 90 mg by mouth 2 (two) times daily. 1 and 1/2, Disp: , Rfl:  .  tiotropium  (SPIRIVA HANDIHALER) 18 MCG inhalation capsule, PLACE 1 CAPSULE INTO INHALER AND INHALE INTO LUNGS DAILY., Disp: 90 capsule, Rfl: 3 No current facility-administered medications for this visit.   Facility-Administered Medications Ordered in Other Visits:  .  [START ON 03/28/2016] 0.9 %  sodium chloride infusion, , Intravenous, Continuous, Ralene Muskrat, PA-C .  Melene Muller ON 03/28/2016] aspirin EC tablet 325 mg, 325 mg, Oral, 60 min Pre-Op, Ralene Muskrat, PA-C .  Melene Muller ON 03/28/2016] ceFAZolin (ANCEF) 2 g in dextrose 5 % 100 mL IVPB, 2 g, Intravenous, to XRAY, Ralene Muskrat, PA-C .  Melene Muller ON 03/28/2016] niMODipine (NIMOTOP) capsule 0-60 mg, 0-60 mg, Oral, 60 min Pre-Op, Ralene Muskrat, PA-C   Review of Systems     Objective:   Physical Exam  Constitutional: He is oriented to person, place, and time. He appears well-developed and well-nourished. No distress.  HENT:  Head: Normocephalic and atraumatic.  Right Ear: External ear normal.  Left Ear: External ear normal.  Mouth/Throat: Oropharynx is clear and moist. No oropharyngeal exudate.  Eyes: Conjunctivae and EOM are normal. Pupils are equal, round, and reactive to light. Right eye exhibits no discharge. Left eye exhibits no discharge. No scleral icterus.  Neck: Normal range of motion. Neck supple. No JVD present. No tracheal deviation present. No thyromegaly present.  Cardiovascular: Normal rate, regular rhythm and intact distal pulses.  Exam reveals no gallop and no friction rub.   No murmur heard. Pulmonary/Chest: Effort normal and breath sounds normal. No respiratory distress. He has no wheezes. He has no rales. He exhibits no tenderness.  Abdominal: Soft. Bowel sounds are normal. He exhibits no distension and no mass. There is no tenderness. There is no rebound and no guarding.  Musculoskeletal: Normal range of motion. He exhibits no edema or tenderness.  Lymphadenopathy:    He has no cervical adenopathy.  Neurological: He is alert and  oriented to person, place, and time. He has normal reflexes. No cranial nerve deficit. Coordination normal.  Skin: Skin is warm and dry. No rash noted. He is not diaphoretic. No erythema. No pallor.  Psychiatric: He has a normal mood and affect. His behavior is normal. Judgment and thought content normal.  Nursing note and vitals reviewed.   Vitals:   03/26/16 0855  BP: 126/72  Pulse: 83  SpO2: 96%  Weight: 196 lb 3.2 oz (89 kg)  Height: 6' (1.829 m)   Estimated body mass index is 26.61 kg/m as calculated from the following:   Height as of this encounter: 6' (1.829 m).   Weight as of this encounter: 196 lb 3.2 oz (89 kg).      Assessment:     1. Pulmonary emphysema, unspecified emphysema type (HCC)  - Alpha-1 antitrypsin phenotype; Future  2. Smoking  3. Preop respiratory exam    ICD-9-CM ICD-10-CM   1. 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 Smoking 305.1 Z72.0   3. Preop respiratory exam V72.82 Z01.811         Plan:     COPD, severe (HCC) -  Stable diseaes - continue spiriva - try breo (lower dose) sample instead of advair and also price it out - albuterol as needed - check alpha 1 genetic test for copd  - repeat PFT in 6 months (last in 2015) - respect desire to refuse flu and pneumonia vaccines  -  Smoking - you know importance of quiting  Preop respiratory exam - best wishes for coiling  Intracranial tomorrow =- I will try to touch base with DR Kerby Nora; low moderate risk for respiratory complications  followup 4 months or sooner if needed     Dr. Kalman Shan, M.D., [facility].C.P Pulmonary and Critical Care Medicine Staff Physician [facility] Pager: [phone], If no answer or between  15:00h - 7:00h: call [phone]  03/26/2016 9:27 AM

## 2016-03-26 NOTE — Patient Instructions (Signed)
COPD, severe (HCC) -  Stable diseaes - continue spiriva - try breo (lower dose) sample instead of advair and also price it out - albuterol as needed - check alpha 1 genetic test for copd  - repeat PFT in 6 months (last in 2015) - respect desire to refuse flu and pneumonia vaccines  -  Smoking - you know importance of quiting  Preop respiratory exam - best wishes for coiling  Intracranial tomorrow =- I will try to touch base with DR Kerby Nora; low moderate risk for respiratory complications  followup 4 months or sooner if needed

## 2016-03-26 NOTE — Progress Notes (Signed)
SPOKE WITH Briggette Najarian IN IV RAD. WHO STATED SHE HAS ALREADY CALLED PATIENT AND INST HIM ON ELIQUIS. WE WILL DRAW P2Y12 IN AM.

## 2016-03-27 ENCOUNTER — Ambulatory Visit (HOSPITAL_COMMUNITY): Payer: 59 | Admitting: Vascular Surgery

## 2016-03-27 ENCOUNTER — Encounter (HOSPITAL_COMMUNITY): Payer: Self-pay | Admitting: *Deleted

## 2016-03-27 ENCOUNTER — Encounter (HOSPITAL_COMMUNITY)
Admission: AD | Disposition: A | Discharge status: Home / Self Care | Payer: Self-pay | Source: Ambulatory Visit | Attending: Interventional Radiology

## 2016-03-27 ENCOUNTER — Ambulatory Visit (HOSPITAL_COMMUNITY): Payer: 59 | Admitting: Certified Registered Nurse Anesthetist

## 2016-03-27 ENCOUNTER — Ambulatory Visit (HOSPITAL_COMMUNITY)
Admission: RE | Admit: 2016-03-27 | Discharge: 2016-03-27 | Disposition: A | Discharge status: Home / Self Care | Payer: 59 | Source: Ambulatory Visit | Attending: Interventional Radiology | Admitting: Interventional Radiology

## 2016-03-27 ENCOUNTER — Inpatient Hospital Stay (HOSPITAL_COMMUNITY): Payer: 59

## 2016-03-27 ENCOUNTER — Inpatient Hospital Stay (HOSPITAL_COMMUNITY)
Admission: AD | Admit: 2016-03-27 | Discharge: 2016-03-29 | DRG: 027 | Disposition: A | Discharge status: Home / Self Care | Payer: 59 | Source: Ambulatory Visit | Attending: Interventional Radiology | Admitting: Interventional Radiology

## 2016-03-27 DIAGNOSIS — F17210 Nicotine dependence, cigarettes, uncomplicated: Secondary | ICD-10-CM | POA: Diagnosis present

## 2016-03-27 DIAGNOSIS — J449 Chronic obstructive pulmonary disease, unspecified: Secondary | ICD-10-CM | POA: Diagnosis not present

## 2016-03-27 DIAGNOSIS — Z888 Allergy status to other drugs, medicaments and biological substances status: Secondary | ICD-10-CM

## 2016-03-27 DIAGNOSIS — Z823 Family history of stroke: Secondary | ICD-10-CM | POA: Diagnosis not present

## 2016-03-27 DIAGNOSIS — M25531 Pain in right wrist: Secondary | ICD-10-CM | POA: Diagnosis not present

## 2016-03-27 DIAGNOSIS — Z09 Encounter for follow-up examination after completed treatment for conditions other than malignant neoplasm: Secondary | ICD-10-CM | POA: Diagnosis not present

## 2016-03-27 DIAGNOSIS — M069 Rheumatoid arthritis, unspecified: Secondary | ICD-10-CM | POA: Diagnosis not present

## 2016-03-27 DIAGNOSIS — R531 Weakness: Secondary | ICD-10-CM | POA: Diagnosis not present

## 2016-03-27 DIAGNOSIS — Z8261 Family history of arthritis: Secondary | ICD-10-CM | POA: Diagnosis not present

## 2016-03-27 DIAGNOSIS — E7800 Pure hypercholesterolemia, unspecified: Secondary | ICD-10-CM | POA: Diagnosis present

## 2016-03-27 DIAGNOSIS — J441 Chronic obstructive pulmonary disease with (acute) exacerbation: Secondary | ICD-10-CM

## 2016-03-27 DIAGNOSIS — Z8249 Family history of ischemic heart disease and other diseases of the circulatory system: Secondary | ICD-10-CM

## 2016-03-27 DIAGNOSIS — I10 Essential (primary) hypertension: Secondary | ICD-10-CM | POA: Diagnosis present

## 2016-03-27 DIAGNOSIS — J439 Emphysema, unspecified: Secondary | ICD-10-CM

## 2016-03-27 DIAGNOSIS — I729 Aneurysm of unspecified site: Secondary | ICD-10-CM

## 2016-03-27 DIAGNOSIS — I671 Cerebral aneurysm, nonruptured: Secondary | ICD-10-CM | POA: Diagnosis not present

## 2016-03-27 DIAGNOSIS — R200 Anesthesia of skin: Secondary | ICD-10-CM

## 2016-03-27 HISTORY — PX: RADIOLOGY WITH ANESTHESIA: SHX6223

## 2016-03-27 HISTORY — PX: IR GENERIC HISTORICAL: IMG1180011

## 2016-03-27 LAB — POCT ACTIVATED CLOTTING TIME
Activated Clotting Time: 191 seconds
Activated Clotting Time: 197 seconds

## 2016-03-27 LAB — PLATELET INHIBITION P2Y12: PLATELET FUNCTION P2Y12: 82 [PRU] — AB (ref 194–418)

## 2016-03-27 LAB — HEPARIN LEVEL (UNFRACTIONATED): Heparin Unfractionated: 0.64 IU/mL (ref 0.30–0.70)

## 2016-03-27 LAB — MRSA PCR SCREENING: MRSA by PCR: NEGATIVE

## 2016-03-27 SURGERY — RADIOLOGY WITH ANESTHESIA
Anesthesia: General

## 2016-03-27 MED ORDER — LIDOCAINE HCL 1 % IJ SOLN
INTRAMUSCULAR | Status: AC | PRN
  Administered 2016-03-27: 20 mL

## 2016-03-27 MED ORDER — HEPARIN (PORCINE) IN NACL 100-0.45 UNIT/ML-% IJ SOLN
INTRAMUSCULAR | Status: AC
Start: 2016-03-27 — End: 2016-03-27
  Filled 2016-03-27: qty 250

## 2016-03-27 MED ORDER — ACETAMINOPHEN 650 MG RE SUPP: 650.0000 mg | Freq: Four times a day (QID) | RECTAL | Status: DC | PRN

## 2016-03-27 MED ORDER — NIMODIPINE 30 MG PO CAPS: 0.0000 mg | ORAL_CAPSULE | ORAL | Status: DC

## 2016-03-27 MED ORDER — PROMETHAZINE HCL 25 MG/ML IJ SOLN
6.2500 mg | INTRAMUSCULAR | Status: DC | PRN
Start: 2016-03-27 — End: 2016-03-27

## 2016-03-27 MED ORDER — CEFAZOLIN SODIUM-DEXTROSE 2-4 GM/100ML-% IV SOLN
2.0000 g | INTRAVENOUS | Status: AC
  Administered 2016-03-27: 2 g via INTRAVENOUS
  Filled 2016-03-27: qty 100

## 2016-03-27 MED ORDER — NICARDIPINE HCL IN NACL 20-0.86 MG/200ML-% IV SOLN
5.0000 mg/h | INTRAVENOUS | Status: DC
  Filled 2016-03-27: qty 200

## 2016-03-27 MED ORDER — HEPARIN (PORCINE) IN NACL 100-0.45 UNIT/ML-% IJ SOLN
500.0000 [IU]/h | INTRAMUSCULAR | Status: DC
  Filled 2016-03-27: qty 250

## 2016-03-27 MED ORDER — TICAGRELOR 90 MG PO TABS
45.0000 mg | ORAL_TABLET | Freq: Two times a day (BID) | ORAL | Status: DC
  Administered 2016-03-27: 45 mg via ORAL
  Filled 2016-03-27 (×2): qty 1

## 2016-03-27 MED ORDER — HEPARIN (PORCINE) IN NACL 100-0.45 UNIT/ML-% IJ SOLN
700.0000 [IU]/h | INTRAMUSCULAR | Status: DC
  Administered 2016-03-27: 700 [IU]/h via INTRAVENOUS
  Filled 2016-03-27: qty 250

## 2016-03-27 MED ORDER — FENTANYL CITRATE (PF) 100 MCG/2ML IJ SOLN
INTRAMUSCULAR | Status: DC | PRN
  Administered 2016-03-27 (×4): 50 ug via INTRAVENOUS
  Administered 2016-03-27: 100 ug via INTRAVENOUS
  Administered 2016-03-27: 50 ug via INTRAVENOUS

## 2016-03-27 MED ORDER — IOPAMIDOL (ISOVUE-300) INJECTION 61%
INTRAVENOUS | Status: AC
  Administered 2016-03-27: 75 mL
  Filled 2016-03-27: qty 150

## 2016-03-27 MED ORDER — CLONIDINE HCL 0.1 MG PO TABS
0.1000 mg | ORAL_TABLET | Freq: Every day | ORAL | Status: DC
  Administered 2016-03-28 – 2016-03-29 (×2): 0.1 mg via ORAL
  Filled 2016-03-27 (×2): qty 1

## 2016-03-27 MED ORDER — SUGAMMADEX SODIUM 200 MG/2ML IV SOLN
INTRAVENOUS | Status: DC | PRN
  Administered 2016-03-27: 200 mg via INTRAVENOUS

## 2016-03-27 MED ORDER — ASPIRIN EC 325 MG PO TBEC: 325.0000 mg | DELAYED_RELEASE_TABLET | ORAL | Status: DC

## 2016-03-27 MED ORDER — LISINOPRIL 10 MG PO TABS
10.0000 mg | ORAL_TABLET | Freq: Every day | ORAL | Status: DC
  Administered 2016-03-28 – 2016-03-29 (×2): 10 mg via ORAL
  Filled 2016-03-27 (×2): qty 1

## 2016-03-27 MED ORDER — EPTIFIBATIDE 20 MG/10ML IV SOLN
INTRAVENOUS | Status: AC
  Administered 2016-03-27: 1.8 mg via INTRA_ARTERIAL
  Administered 2016-03-27: 3.8 mg
  Administered 2016-03-27: 1.8 mg via INTRA_ARTERIAL
  Filled 2016-03-27: qty 10

## 2016-03-27 MED ORDER — ROCURONIUM BROMIDE 10 MG/ML (PF) SYRINGE
PREFILLED_SYRINGE | INTRAVENOUS | Status: DC | PRN
  Administered 2016-03-27: 20 mg via INTRAVENOUS
  Administered 2016-03-27: 50 mg via INTRAVENOUS

## 2016-03-27 MED ORDER — PROPOFOL 10 MG/ML IV BOLUS
INTRAVENOUS | Status: DC | PRN
  Administered 2016-03-27: 50 mg via INTRAVENOUS
  Administered 2016-03-27: 150 mg via INTRAVENOUS
  Administered 2016-03-27: 50 mg via INTRAVENOUS

## 2016-03-27 MED ORDER — LIDOCAINE HCL 1 % IJ SOLN
INTRAMUSCULAR | Status: AC
  Filled 2016-03-27: qty 20

## 2016-03-27 MED ORDER — IOPAMIDOL (ISOVUE-300) INJECTION 61%
INTRAVENOUS | Status: AC
  Administered 2016-03-27: 2 mL
  Filled 2016-03-27: qty 100

## 2016-03-27 MED ORDER — TIOTROPIUM BROMIDE MONOHYDRATE 18 MCG IN CAPS
18.0000 ug | ORAL_CAPSULE | Freq: Every day | RESPIRATORY_TRACT | Status: DC
  Administered 2016-03-28: 18 ug via RESPIRATORY_TRACT
  Filled 2016-03-27: qty 5

## 2016-03-27 MED ORDER — AMLODIPINE BESYLATE 10 MG PO TABS
10.0000 mg | ORAL_TABLET | Freq: Every day | ORAL | Status: DC
Start: 2016-03-28 — End: 2016-03-29
  Administered 2016-03-28: 10 mg via ORAL
  Filled 2016-03-27 (×2): qty 1

## 2016-03-27 MED ORDER — ATORVASTATIN CALCIUM 20 MG PO TABS
20.0000 mg | ORAL_TABLET | Freq: Every day | ORAL | Status: DC
  Administered 2016-03-27 – 2016-03-28 (×2): 20 mg via ORAL
  Filled 2016-03-27 (×2): qty 1

## 2016-03-27 MED ORDER — ACETAMINOPHEN 500 MG PO TABS
1000.0000 mg | ORAL_TABLET | Freq: Four times a day (QID) | ORAL | Status: DC | PRN
  Administered 2016-03-27 – 2016-03-28 (×3): 1000 mg via ORAL
  Filled 2016-03-27 (×4): qty 2

## 2016-03-27 MED ORDER — PHENYLEPHRINE HCL 10 MG/ML IJ SOLN
INTRAMUSCULAR | Status: DC | PRN
  Administered 2016-03-27 (×2): 80 ug via INTRAVENOUS
  Administered 2016-03-27: 120 ug via INTRAVENOUS
  Administered 2016-03-27: 40 ug via INTRAVENOUS
  Administered 2016-03-27: 120 ug via INTRAVENOUS
  Administered 2016-03-27: 80 ug via INTRAVENOUS

## 2016-03-27 MED ORDER — TICAGRELOR 90 MG PO TABS: 45.0000 mg | ORAL_TABLET | Freq: Two times a day (BID) | ORAL | Status: DC

## 2016-03-27 MED ORDER — PHENYLEPHRINE HCL 10 MG/ML IJ SOLN
INTRAVENOUS | Status: DC | PRN
  Administered 2016-03-27: 20 ug/min via INTRAVENOUS

## 2016-03-27 MED ORDER — HEPARIN SODIUM (PORCINE) 1000 UNIT/ML IJ SOLN
INTRAMUSCULAR | Status: DC | PRN
  Administered 2016-03-27: 1000 [IU] via INTRAVENOUS
  Administered 2016-03-27: 3000 [IU] via INTRAVENOUS
  Administered 2016-03-27: 1000 [IU] via INTRAVENOUS

## 2016-03-27 MED ORDER — LACTATED RINGERS IV SOLN
INTRAVENOUS | Status: DC | PRN
  Administered 2016-03-27 (×2): via INTRAVENOUS

## 2016-03-27 MED ORDER — IOPAMIDOL (ISOVUE-300) INJECTION 61%
INTRAVENOUS | Status: AC
  Administered 2016-03-27: 21 mL
  Filled 2016-03-27: qty 150

## 2016-03-27 MED ORDER — LACTATED RINGERS IV SOLN: INTRAVENOUS | Status: DC

## 2016-03-27 MED ORDER — TICAGRELOR 90 MG PO TABS
45.0000 mg | ORAL_TABLET | ORAL | Status: AC
  Administered 2016-03-27: 45 mg via ORAL
  Filled 2016-03-27: qty 1

## 2016-03-27 MED ORDER — ONDANSETRON HCL 4 MG/2ML IJ SOLN: 4.0000 mg | Freq: Four times a day (QID) | INTRAMUSCULAR | Status: DC | PRN

## 2016-03-27 MED ORDER — HEPARIN (PORCINE) IN NACL 100-0.45 UNIT/ML-% IJ SOLN
700.0000 [IU]/h | INTRAMUSCULAR | Status: DC
  Filled 2016-03-27: qty 250

## 2016-03-27 MED ORDER — ASPIRIN EC 81 MG PO TBEC
81.0000 mg | DELAYED_RELEASE_TABLET | Freq: Every day | ORAL | Status: DC
  Administered 2016-03-28 – 2016-03-29 (×2): 81 mg via ORAL
  Filled 2016-03-27 (×2): qty 1

## 2016-03-27 MED ORDER — SODIUM CHLORIDE 0.9 % IV SOLN
INTRAVENOUS | Status: DC
  Administered 2016-03-27: 14:00:00 via INTRAVENOUS

## 2016-03-27 MED ORDER — IOPAMIDOL (ISOVUE-300) INJECTION 61%
INTRAVENOUS | Status: AC
  Administered 2016-03-27: 75 mL
  Filled 2016-03-27: qty 100

## 2016-03-27 MED ORDER — SODIUM CHLORIDE 0.9 % IV SOLN: INTRAVENOUS | Status: DC

## 2016-03-27 MED ORDER — HYDROMORPHONE HCL 1 MG/ML IJ SOLN: 0.2500 mg | INTRAMUSCULAR | Status: DC | PRN

## 2016-03-27 MED ORDER — MEPERIDINE HCL 25 MG/ML IJ SOLN: 6.2500 mg | INTRAMUSCULAR | Status: DC | PRN

## 2016-03-27 MED ORDER — ASPIRIN 325 MG PO TABS: 325.0000 mg | ORAL_TABLET | Freq: Every day | ORAL | Status: DC

## 2016-03-27 MED ORDER — ADULT MULTIVITAMIN W/MINERALS CH: 1.0000 | ORAL_TABLET | Freq: Every day | ORAL | Status: DC

## 2016-03-27 MED ORDER — ONDANSETRON HCL 4 MG/2ML IJ SOLN
INTRAMUSCULAR | Status: DC | PRN
  Administered 2016-03-27: 4 mg via INTRAVENOUS

## 2016-03-27 MED ORDER — LIDOCAINE 2% (20 MG/ML) 5 ML SYRINGE
INTRAMUSCULAR | Status: DC | PRN
  Administered 2016-03-27: 100 mg via INTRAVENOUS

## 2016-03-27 MED ORDER — MIDAZOLAM HCL 5 MG/5ML IJ SOLN
INTRAMUSCULAR | Status: DC | PRN
  Administered 2016-03-27 (×2): 1 mg via INTRAVENOUS

## 2016-03-27 MED ORDER — NITROGLYCERIN 1 MG/10 ML FOR IR/CATH LAB
INTRA_ARTERIAL | Status: AC
  Administered 2016-03-27: 25 ug
  Filled 2016-03-27: qty 10

## 2016-03-27 NOTE — Anesthesia Procedure Notes (Signed)
Procedure Name: MAC Date/Time: 03/27/2016 9:03 AM Performed by: Geraldo Docker Pre-anesthesia Checklist: Patient identified, Patient being monitored, Timeout performed, Emergency Drugs available and Suction available Patient Re-evaluated:Patient Re-evaluated prior to inductionOxygen Delivery Method: Simple face mask and Nasal cannula Preoxygenation: Pre-oxygenation with 100% oxygen Number of attempts: 1 Placement Confirmation: positive ETCO2 Dental Injury: Teeth and Oropharynx as per pre-operative assessment

## 2016-03-27 NOTE — Progress Notes (Signed)
ANTICOAGULATION CONSULT NOTE - Initial Consult  Pharmacy Consult for heparin Indication: s/p carotid arteriogram  Allergies  Allergen Reactions  . Symbicort [Budesonide-Formoterol Fumarate] Shortness Of Breath, Nausea And Vomiting and Other (See Comments)    heachache  . [facility] [Mometasone Furo-Formoterol Fum] Other (See Comments)    Headache     Patient Measurements: Weight: 195 lb (88.5 kg) Heparin Dosing Weight: 88.5kg  Vital Signs: Temp: 98.4 F (36.9 C) (08/23 0754) Temp Source: Oral (08/23 0754) BP: 108/75 (08/23 0754) Pulse Rate: 88 (08/23 0754)  Labs:  Recent Labs  03/26/16 1244  HGB 12.9*  HCT 40.4  PLT 274  APTT 31  LABPROT 13.1  INR 0.99  CREATININE 1.36*    Estimated Creatinine Clearance: 67.4 mL/min (by C-G formula based on SCr of 1.36 mg/dL).   Medical History: Past Medical History:  Diagnosis Date  . Asthma   . CAP (community acquired pneumonia) 01/19/2016  . Chronic bronchitis (HCC)   . COPD (chronic obstructive pulmonary disease) (HCC)   . Emphysema lung (HCC)   . Emphysema of lung (HCC)   . Hepatitis A infection ~ 1980  . High cholesterol   . Hypertension   . Pneumonia 1960s  . Rheumatoid arthritis (HCC)    "hands mostly"  ( 01/19/2016)  . Situational depression Dec 05, 2009   "only when my mother died"    Medications:  Infusions:  . sodium chloride    . heparin    . lactated ringers    . niCARDipine      Assessment: 54 yom presented for carotid arteriograms. Post-op a code stroke was called but no intervention. To start IV heparin. Pre-op H/H and platelets are WNL.   Goal of Therapy: Heparin level 0.1-.25 units/ml Monitor platelets by anticoagulation protocol: Yes   Plan:  - Heparin gtt 700 units/hr - Check an 8 hr heparin level - Heparin off at 0700 tomorrow  Jacquese Cassarino, Drake Leach 03/27/2016,1:55 PM

## 2016-03-27 NOTE — Transfer of Care (Signed)
Immediate Anesthesia Transfer of Care Note  Patient: Derek Conner  Procedure(s) Performed: Procedure(s): Embolization (N/A)  Patient Location: PACU  Anesthesia Type:General  Level of Consciousness: awake, alert , oriented and patient cooperative  Airway & Oxygen Therapy: Patient Spontanous Breathing and Patient connected to nasal cannula oxygen  Post-op Assessment: Report given to RN, Post -op Vital signs reviewed and stable and Patient moving all extremities X 4  Post vital signs: Reviewed and stable  Last Vitals:  Vitals:   03/27/16 0754 03/27/16 1400  BP: 108/75 105/79  Pulse: 88   Resp: 20 18  Temp: 36.9 C     Last Pain:  Vitals:   03/27/16 0754  TempSrc: Oral      Patients Stated Pain Goal: 3 (03/27/16 0738)  Complications: No apparent anesthesia complications

## 2016-03-27 NOTE — Procedures (Signed)
S/P bilateral common carotid arteriograms. RT CFA approach. 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

## 2016-03-27 NOTE — Progress Notes (Signed)
Referring Physician(s): Dr Ritta Slot  Supervising Physician: Julieanne Cotton  Patient Status:  Inpatient  Chief Complaint:  Right middle cerebral artery aneurysm CEREBRAL ANGIOGRAM [[id number] (Custom)]       S/P bilateral common carotid arteriograms. RT CFA approach. S/P near complete obliteration of Rt MCA aneurysm with primary coiling.Also use of 7.2 mg of superselective  INTEGRELIN       Subjective:  Resting Only complaint Rt wrist pain (art line now removed) Pain is better Denies headache Denies N/V Denies speech or visual issues Dr Corliss Skains has seen and evaluated pt  Allergies: Symbicort [budesonide-formoterol fumarate] and Dulera [mometasone furo-formoterol fum]  Medications: Prior to Admission medications   Medication Sig Start Date End Date Taking? Authorizing Provider  albuterol (PROVENTIL HFA;VENTOLIN HFA) 108 (90 Base) MCG/ACT inhaler Inhale 2 puffs into the lungs every 6 (six) hours as needed for wheezing or shortness of breath. 01/25/16  Yes Jessica C Copland, MD  amLODipine (NORVASC) 10 MG tablet TAKE 1 TABLET (10 MG) BY MOUTH DAILY. 01/25/16  Yes Gwenlyn Found Copland, MD  aspirin EC 81 MG tablet Take 81 mg by mouth daily.   Yes Historical Provider, MD  atorvastatin (LIPITOR) 20 MG tablet Take 1 tablet (20 mg total) by mouth daily. 06/28/15  Yes Edward Saguier, PA-C  cloNIDine (CATAPRES) 0.1 MG tablet TAKE 1 TABLET (0.1 MG TOTAL) BY MOUTH 2 (TWO) TIMES DAILY. 01/25/16  Yes Gwenlyn Found Copland, MD  Fluticasone-Salmeterol (ADVAIR DISKUS) 500-50 MCG/DOSE AEPB INHALE 1 PUFF BY MOUTH INTO THE LUNGS 2 TIMES DAILY. 01/25/16  Yes Jessica C Copland, MD  lisinopril (PRINIVIL,ZESTRIL) 10 MG tablet TAKE 1 TABLET (10 MG) BY MOUTH DAILY. 01/25/16  Yes Gwenlyn Found Copland, MD  ticagrelor (BRILINTA) 90 MG TABS tablet Take 90 mg by mouth 2 (two) times daily. 1 and 1/2   Yes Historical Provider, MD  tiotropium (SPIRIVA HANDIHALER) 18 MCG inhalation capsule PLACE 1  CAPSULE INTO INHALER AND INHALE INTO LUNGS DAILY. 01/25/16  Yes Jessica C Copland, MD  fluticasone furoate-vilanterol (BREO ELLIPTA) 100-25 MCG/INH AEPB Inhale 1 puff into the lungs daily. 03/26/16 03/27/16  Kalman Shan, MD  Multiple Vitamin (MULTIVITAMIN WITH MINERALS) TABS tablet Take 1 tablet by mouth daily. 12/08/13   Elease Etienne, MD     Vital Signs: BP 113/68   Pulse 70   Temp 98 F (36.7 C) (Oral)   Resp (!) 9   Ht 6' (1.829 m)   Wt 194 lb 14.2 oz (88.4 kg)   SpO2 95%   BMI 26.43 kg/m   Physical Exam  Constitutional: He is oriented to person, place, and time.  HENT:  Face symmetrical Smile =   Musculoskeletal: Normal range of motion.  Rt hand slow to perform Good strength FROM Moves all 4s Rt foot 2+ pulses  Rt groin NT no bleeding No hematoma Sheath intact  Noticeable shakiness of arms  Neurological: He is alert and oriented to person, place, and time.  Skin: Skin is warm and dry.  Psychiatric: He has a normal mood and affect. His behavior is normal. Judgment and thought content normal.  Nursing note and vitals reviewed.   Imaging: Ct Head Code Stroke Wo Contrast`  Result Date: 03/27/2016 CLINICAL DATA:  Code stroke. 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 Initial encounter. EXAM: CT HEAD WITHOUT CONTRAST TECHNIQUE: Contiguous axial images were obtained from the base of the skull through the vertex without intravenous contrast. COMPARISON:  CTA head and neck 01/22/2016. FINDINGS: Stable paranasal sinuses and mastoids. No acute osseous abnormality identified. No acute orbit or scalp soft tissue findings. New right ICA terminus region metal coil pack with mild streak artifact. There is some intravascular contrast present, from the recent endovascular procedure. No acute intracranial hemorrhage is identified. Gray-white matter differentiation appears stable and normal. No  cortically based acute infarct identified. ASPECTS [facility] Stroke Program Early CT Score) Total score (0-10 with 10 being normal): 10. No ventriculomegaly.  No abnormal enhancement identified. IMPRESSION: 1. No acute cortically based infarct, acute intracranial hemorrhage, or acute intracranial abnormality identified. 2. ASPECTS is 10. 3. Right MCA bifurcation region coil pack. No adverse features identified. I briefly discussed this by telephone with Provider Raynelle Fanning in the [facility] department on 03/27/2016 at 14:02 . She advises that the patient has symptoms had rapidly improved and the code stroke was canceled. I left a message for her to relay that this exam looks negative and for Dr. Julieanne Cotton to call me back with any questions. Electronically Signed   By: Odessa Fleming M.D.   On: 03/27/2016 14:04    Labs:  CBC:  Recent Labs  01/19/16 1110 02/08/16 1227 02/19/16 0946 03/26/16 1244  WBC 11.3* 14.0* 7.4 12.2*  HGB 13.7 12.7* 12.8* 12.9*  HCT 40.9 37.7* 40.4 40.4  PLT 313 277.0 263 274    COAGS:  Recent Labs  02/19/16 0946 03/26/16 1244  INR 0.97 0.99  APTT 30 31    BMP:  Recent Labs  01/19/16 1110 01/20/16 0520 02/08/16 1227 02/19/16 0946 03/26/16 1244  NA 134* 134* 132* 138 134*  K 4.1 4.5 4.6 4.3 4.0  CL 104 102 100 109 106  CO2 21* 23 26 25  20*  GLUCOSE 112* 127* 111* 104* 137*  BUN 24* 25* 23 19 13   CALCIUM 9.4 9.4 9.6 9.6 9.1  CREATININE 1.33* 1.32* 1.29 1.23 1.36*  GFRNONAA 59* 59*  --  >60 57*  GFRAA >60 >60  --  >60 >60    LIVER FUNCTION TESTS:  Recent Labs  08/29/15 0948 01/19/16 1110 02/19/16 0946 03/26/16 1244  BILITOT 0.6 0.5 0.9 0.8  AST 20 27 30 24   ALT 22 25 44 22  ALKPHOS 65 60 62 65  PROT 6.8 7.5 6.8 6.6  ALBUMIN 4.3 4.1 4.0 3.9    Assessment and Plan:  R MCA aneurysm coiling/embolization Dc art line advance diet only to cl liquids tonight  Electronically Signed: Kentrel Clevenger A 03/27/2016, 4:01 PM   I spent a total of 20  minutes at the the patient's bedside AND on the patient's hospital floor or unit, greater than 50% of which was counseling/coordinating care for R MCA aneurysm

## 2016-03-27 NOTE — Progress Notes (Signed)
Patient here for outpatient angiogram and coiling.  LSN 1027 prior to procedure, when patient was waking up noted to have left hand grip weakness.  Patient lethargic but easily arousable,  NIHSS 1, Patient to CT scan then neuro PACU.

## 2016-03-27 NOTE — H&P (Signed)
Chief Complaint: right MCA aneurysm  Referring Physician:Dr. Ritta Slot  Supervising Physician: Julieanne Cotton  Patient Status:  Out-pt  HPI: Derek Conner is an 55 y.o. male who was admitted in June with a cough and a syncopal episode who was admitted.  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  He was evaluated by neurology and referred to Dr. Corliss Skains for evaluation of this aneurysm.  He is currently feeling well.  He denies any HA, SOB, CP, fevers, etc.  He was placed on Plavix initially, but his P2Y12 did not respond well to this so he was switched to Brilinta.  The patient presents today for his procedure.  Past Medical History:  Past Medical History:  Diagnosis Date  . Asthma   . CAP (community acquired pneumonia) 01/19/2016  . Chronic bronchitis (HCC)   . COPD (chronic obstructive pulmonary disease) (HCC)   . Emphysema lung (HCC)   . Emphysema of lung (HCC)   . Hepatitis A infection ~ 1980  . High cholesterol   . Hypertension   . Pneumonia 1960s  . Rheumatoid arthritis (HCC)    "hands mostly"  ( 01/19/2016)  . Situational depression 12-13-2009   "only when my mother died"    Past Surgical History:  Past Surgical History:  Procedure Laterality Date  . APPENDECTOMY  1980's  . CHEST TUBE INSERTION Left 1990's   "lung collapsed"  . CYSTECTOMY Left 1960's   "wrist"  . FOOT FRACTURE SURGERY Left ~ Dec 14, 2003   "it was crushed"  . RADIOLOGY WITH ANESTHESIA N/A 02/29/2016   Procedure: EMBOLIZATION     (RADIOLOGY WITH ANESTHESIA);  Surgeon: Julieanne Cotton, MD;  Location: [facility] OR;  Service: Radiology;  Laterality: N/A;  . VIDEO BRONCHOSCOPY Bilateral 12/06/2013   Procedure: VIDEO BRONCHOSCOPY WITHOUT FLUORO;  Surgeon: Alyson Reedy, MD;  Location: [facility] ENDOSCOPY;  Service: Cardiopulmonary;  Laterality: Bilateral;    Family History:  Family History  Problem Relation Age of Onset  . Cancer Mother   . Arthritis Mother   . Stroke Father   . Hypertension  Father   . Colon cancer Neg Hx     Social History:  reports that he has been smoking Cigarettes.  He has a 40.00 pack-year smoking history. He has never used smokeless tobacco. He reports that he drinks about 6.0 oz of alcohol per week . He reports that he does not use drugs.  Allergies:  Allergies  Allergen Reactions  . Symbicort [Budesonide-Formoterol Fumarate] Shortness Of Breath, Nausea And Vomiting and Other (See Comments)    heachache  . Dulera [Mometasone Furo-Formoterol Fum] Other (See Comments)    Headache     Medications: Medications reviewed in Epic  Please HPI for pertinent positives, otherwise complete 10 system ROS negative.  Mallampati Score: MD Evaluation Airway: WNL Heart: WNL Abdomen: WNL Chest/ Lungs: WNL  Physical Exam: BP 108/75   Pulse 88   Temp 98.4 F (36.9 C) (Oral)   Resp 20   Wt 195 lb (88.5 kg)   SpO2 97%   BMI 26.45 kg/m  Body mass index is 26.45 kg/m. General: pleasant, WD, WN white male who is laying in bed in NAD HEENT: head is normocephalic, atraumatic.  Sclera are noninjected.  PERRL.  Ears and nose without any masses or lesions.  Mouth is pink and moist Heart: regular, rate, and rhythm.  Normal s1,s2. No obvious murmurs, gallops, or rubs noted.  Palpable radial and pedal pulses bilaterally Lungs: CTAB, no wheezes, rhonchi,  or rales noted.  Respiratory effort nonlabored Abd: soft, NT, ND, +BS, no masses, hernias, or organomegaly MS: all 4 extremities are symmetrical with no cyanosis, clubbing, or edema. Neuro: intact with no deficits at this time. Psych: A&Ox3 with an appropriate affect.   Labs: Results for orders placed or performed during the hospital encounter of 03/27/16 (from the past 48 hour(s))  Platelet inhibition p2y12 (Not at [facility])     Status: Abnormal   Collection Time: 03/27/16  6:53 AM  Result Value Ref Range   Platelet Function  P2Y12 82 (L) 194 - 418 PRU    Comment:        The literature has shown a  direct correlation of PRU values over 230 with higher risks of thrombotic events.  Lower PRU values are associated with platelet inhibition.     Imaging: No results found.  Assessment/Plan 1. (R) MCA aneurysm  -the patient's P2Y12 this morning is 81.  We will proceed with intervention today as planned. -the rest of the patient's labs and vitals have been reviewed. -Risks and Benefits discussed with the patient including, but not limited to bleeding, infection, vascular injury or contrast induced renal failure. All of the patient's questions were answered, patient is agreeable to proceed. Consent signed and in chart.  Thank you for this interesting consult.  I greatly enjoyed meeting Derek Conner and look forward to participating in their care.  A copy of this report was sent to the requesting provider on this date.  Electronically Signed: Letha Cape 03/27/2016, 8:38 AM   I spent a total of    25 Minutes in face to face in clinical consultation, greater than 50% of which was counseling/coordinating care for (R) MCA aneurysm

## 2016-03-27 NOTE — Progress Notes (Addendum)
ANTICOAGULATION CONSULT NOTE  Pharmacy Consult for heparin Indication: s/p carotid arteriogram  Allergies  Allergen Reactions  . Symbicort [Budesonide-Formoterol Fumarate] Shortness Of Breath, Nausea And Vomiting and Other (See Comments)    heachache  . [facility] [Mometasone Furo-Formoterol Fum] Other (See Comments)    Headache     Patient Measurements: Height: 6' (182.9 cm) Weight: 194 lb 14.2 oz (88.4 kg) IBW/kg (Calculated) : 77.6 Heparin Dosing Weight: 88.5kg  Vital Signs: Temp: 98 F (36.7 C) (08/23 1452) Temp Source: Oral (08/23 1452) BP: 120/78 (08/23 1800) Pulse Rate: 87 (08/23 1800)  Labs:  Recent Labs  03/26/16 1244 03/27/16 1620  HGB 12.9*  --   HCT 40.4  --   PLT 274  --   APTT 31  --   LABPROT 13.1  --   INR 0.99  --   HEPARINUNFRC  --  0.64  CREATININE 1.36*  --     Estimated Creatinine Clearance: 67.4 mL/min (by C-G formula based on SCr of 1.36 mg/dL).   Medical History: Past Medical History:  Diagnosis Date  . Asthma   . CAP (community acquired pneumonia) 01/19/2016  . Chronic bronchitis (HCC)   . COPD (chronic obstructive pulmonary disease) (HCC)   . Emphysema lung (HCC)   . Emphysema of lung (HCC)   . Hepatitis A infection ~ 1980  . High cholesterol   . Hypertension   . Pneumonia 1960s  . Rheumatoid arthritis (HCC)    "hands mostly"  ( 01/19/2016)  . Situational depression 2009-12-07   "only when my mother died"    Medications:  Infusions:  . sodium chloride 75 mL/hr at 03/27/16 1600  . heparin 700 Units/hr (03/27/16 1419)  . niCARDipine Stopped (03/27/16 1315)    Assessment: 54 yom presented for carotid arteriograms. Post-op a code stroke was called but no intervention. He is on heparin and the heparin level is above goal (HL= 0.64). The level was collected ~ 4 hours after heparin was started.   Goal of Therapy: Heparin level 0.1-.25 units/ml Monitor platelets by anticoagulation protocol: Yes   Plan:  -Decrease heparin to 500  units/hr - Heparin off at 0700 tomorrow  Harland German, Pharm D 03/27/2016 6:24 PM

## 2016-03-27 NOTE — Anesthesia Preprocedure Evaluation (Addendum)
Anesthesia Evaluation  Patient identified by MRN, date of birth, ID band Patient awake    Reviewed: Allergy & Precautions, NPO status , Patient's Chart, lab work & pertinent test results  Airway Mallampati: I  TM Distance: >3 FB     Dental  (+) Edentulous Upper, Edentulous Lower, Dental Advisory Given   Pulmonary asthma , pneumonia, resolved, COPD,  COPD inhaler, Current Smoker,  Pulmonologist has evaled for preop and COPD is stable, he is not on oxygen, is on maintenance triple therapy inhaler.Marland Kitchen He is low-moderate risk for pulmonary complications and patient is aware..    breath sounds clear to auscultation       Cardiovascular hypertension, Pt. on medications  Rhythm:Regular Rate:Normal     Neuro/Psych Depression    GI/Hepatic (+) Hepatitis -  Endo/Other    Renal/GU      Musculoskeletal  (+) Arthritis ,   Abdominal (+)  Abdomen: soft. Bowel sounds: normal.  Peds  Hematology   Anesthesia Other Findings   Reproductive/Obstetrics                            Lab Results  Component Value Date   WBC 12.2 (H) 03/26/2016   HGB 12.9 (L) 03/26/2016   HCT 40.4 03/26/2016   MCV 90.4 03/26/2016   PLT 274 03/26/2016   Lab Results  Component Value Date   INR 0.99 03/26/2016   INR 0.97 02/19/2016   INR 0.92 12/03/2013   Lab Results  Component Value Date   CREATININE 1.36 (H) 03/26/2016   BUN 13 03/26/2016   NA 134 (L) 03/26/2016   K 4.0 03/26/2016   CL 106 03/26/2016   CO2 20 (L) 03/26/2016   01/2016 EKG: normal sinus rhythm.   Anesthesia Physical  Anesthesia Plan  ASA: III  Anesthesia Plan: General   Post-op Pain Management:    Induction: Intravenous  Airway Management Planned: Oral ETT  Additional Equipment: Arterial line  Intra-op Plan:   Post-operative Plan: Possible Post-op intubation/ventilation  Informed Consent: I have reviewed the patients History and Physical,  chart, labs and discussed the procedure including the risks, benefits and alternatives for the proposed anesthesia with the patient or authorized representative who has indicated his/her understanding and acceptance.   Dental advisory given  Plan Discussed with: CRNA  Anesthesia Plan Comments: (Will provide optimial intraop vent strategy, I:E ratio optimization, nontoxic O2 setting, low Tidal volume with low dose PEEP and  albuterol as needed)       Anesthesia Quick Evaluation

## 2016-03-27 NOTE — Anesthesia Postprocedure Evaluation (Signed)
Anesthesia Post Note  Patient: Derek Conner  Procedure(s) Performed: Procedure(s) (LRB): Embolization (N/A)  Patient location during evaluation: PACU Anesthesia Type: General Level of consciousness: awake and alert Pain management: pain level controlled Vital Signs Assessment: post-procedure vital signs reviewed and stable Respiratory status: spontaneous breathing, nonlabored ventilation, respiratory function stable and patient connected to nasal cannula oxygen Cardiovascular status: blood pressure returned to baseline and stable Postop Assessment: no signs of nausea or vomiting Anesthetic complications: no    Last Vitals:  Vitals:   03/27/16 1452 03/27/16 1500  BP:    Pulse: 74 70  Resp: (!) 9 (!) 9  Temp: 36.7 C     Last Pain:  Vitals:   03/27/16 1500  TempSrc:   PainSc: 7                  Cecile Hearing

## 2016-03-27 NOTE — Anesthesia Procedure Notes (Signed)
Procedure Name: Intubation Date/Time: 03/27/2016 10:27 AM Performed by: Geraldo Docker Pre-anesthesia Checklist: Patient identified, Patient being monitored, Timeout performed, Emergency Drugs available and Suction available Patient Re-evaluated:Patient Re-evaluated prior to inductionOxygen Delivery Method: Circle System Utilized Preoxygenation: Pre-oxygenation with 100% oxygen Intubation Type: IV induction Ventilation: Mask ventilation without difficulty and Oral airway inserted - appropriate to patient size Laryngoscope Size: Miller and 3 Grade View: Grade I Tube type: Oral Tube size: 8.0 mm Number of attempts: 1 Airway Equipment and Method: Stylet Placement Confirmation: ETT inserted through vocal cords under direct vision,  positive ETCO2 and breath sounds checked- equal and bilateral Secured at: 23 cm Tube secured with: Tape Dental Injury: Teeth and Oropharynx as per pre-operative assessment

## 2016-03-27 NOTE — Progress Notes (Signed)
Patient ID: Derek Conner, male   DOB: Jun 10, 1961, 55 y.o.   MRN: [id number] INR . Patient extubated without difficulty. However  slow in recovering Lt UE . Gradually improved ,but with weak lt hand grip. VS stable. Denies any H/As ,N/V . Code stroke called . By the time they arrived  Patient more fluent and lt hand grip significantly stronger. . Patient to CT brain per protocol. Stroke MD here. D/W patients wife . DR Corliss Skains

## 2016-03-28 ENCOUNTER — Inpatient Hospital Stay (HOSPITAL_COMMUNITY): Payer: 59

## 2016-03-28 ENCOUNTER — Encounter (HOSPITAL_COMMUNITY): Payer: Self-pay | Admitting: Interventional Radiology

## 2016-03-28 LAB — CBC WITH DIFFERENTIAL/PLATELET
BASOS PCT: 0 %
Basophils Absolute: 0 10*3/uL (ref 0.0–0.1)
EOS ABS: 0.5 10*3/uL (ref 0.0–0.7)
Eosinophils Relative: 5 %
HCT: 35.5 % — ABNORMAL LOW (ref 39.0–52.0)
HEMOGLOBIN: 11.4 g/dL — AB (ref 13.0–17.0)
Lymphocytes Relative: 24 %
Lymphs Abs: 2.2 10*3/uL (ref 0.7–4.0)
MCH: 28.7 pg (ref 26.0–34.0)
MCHC: 32.1 g/dL (ref 30.0–36.0)
MCV: 89.4 fL (ref 78.0–100.0)
Monocytes Absolute: 1 10*3/uL (ref 0.1–1.0)
Monocytes Relative: 11 %
NEUTROS PCT: 60 %
Neutro Abs: 5.5 10*3/uL (ref 1.7–7.7)
Platelets: 257 10*3/uL (ref 150–400)
RBC: 3.97 MIL/uL — AB (ref 4.22–5.81)
RDW: 13.5 % (ref 11.5–15.5)
WBC: 9.2 10*3/uL (ref 4.0–10.5)

## 2016-03-28 LAB — BASIC METABOLIC PANEL
ANION GAP: 4 — AB (ref 5–15)
BUN: 10 mg/dL (ref 6–20)
CALCIUM: 8.7 mg/dL — AB (ref 8.9–10.3)
CHLORIDE: 107 mmol/L (ref 101–111)
CO2: 23 mmol/L (ref 22–32)
CREATININE: 1.14 mg/dL (ref 0.61–1.24)
GFR calc non Af Amer: >60 mL/min (ref >60)
Glucose, Bld: 107 mg/dL — ABNORMAL HIGH (ref 65–99)
Potassium: 4.1 mmol/L (ref 3.5–5.1)
SODIUM: 134 mmol/L — AB (ref 135–145)

## 2016-03-28 LAB — PLATELET INHIBITION P2Y12: PLATELET FUNCTION P2Y12: 113 [PRU] — AB (ref 194–418)

## 2016-03-28 MED ORDER — FENTANYL CITRATE (PF) 100 MCG/2ML IJ SOLN
INTRAMUSCULAR | Status: AC
  Administered 2016-03-28: 100 ug
  Filled 2016-03-28: qty 2

## 2016-03-28 MED ORDER — TICAGRELOR 90 MG PO TABS
45.0000 mg | ORAL_TABLET | Freq: Two times a day (BID) | ORAL | Status: DC
  Administered 2016-03-28 – 2016-03-29 (×2): 45 mg via ORAL
  Filled 2016-03-28 (×2): qty 1

## 2016-03-28 MED ORDER — TICAGRELOR 90 MG PO TABS
90.0000 mg | ORAL_TABLET | Freq: Once | ORAL | Status: AC
  Administered 2016-03-28: 90 mg via ORAL
  Filled 2016-03-28: qty 1

## 2016-03-28 NOTE — Progress Notes (Signed)
Right 26fr sheath pull.  No hematoma present before sheath pull. Deployed 39fr Exoseal and held manual pressure for 15 minutes. No complications.  Applied tegaderm and pressure dressing. Verified site with Renae Fickle RN

## 2016-03-28 NOTE — Consult Note (Signed)
   [facility] CM Inpatient Consult   03/28/2016  Ignace Mandigo [birth year] [id number]   Mr. Laris screened for Link to [facility] Care Management program on behalf of [facility] employees with [facility] insurance. Called to unit. No phone is in the room to speak with patient. Will follow up at bedside if still in hospital on tomorrow morning.  Raiford Noble, MSN-Ed, RN,BSN [facility] Liaison [phone]

## 2016-03-28 NOTE — Progress Notes (Signed)
Referring Physician(s): DR Ritta Slot  Supervising Physician: Julieanne Cotton  Patient Status:  Inpatient  Chief Complaint:  R MCA aneurysm coiling 8/23 in IR  Subjective:  Minimally nauseated this am Headache is midfrontal---Tylenol helps Back ache Rt groin tender Moving Left hand better  Allergies: Symbicort [budesonide-formoterol fumarate] and Dulera [mometasone furo-formoterol fum]  Medications: Prior to Admission medications   Medication Sig Start Date End Date Taking? Authorizing Provider  albuterol (PROVENTIL HFA;VENTOLIN HFA) 108 (90 Base) MCG/ACT inhaler Inhale 2 puffs into the lungs every 6 (six) hours as needed for wheezing or shortness of breath. 01/25/16  Yes Jessica C Copland, MD  amLODipine (NORVASC) 10 MG tablet TAKE 1 TABLET (10 MG) BY MOUTH DAILY. 01/25/16  Yes Gwenlyn Found Copland, MD  aspirin EC 81 MG tablet Take 81 mg by mouth daily.   Yes Historical Provider, MD  atorvastatin (LIPITOR) 20 MG tablet Take 1 tablet (20 mg total) by mouth daily. 06/28/15  Yes Edward Saguier, PA-C  cloNIDine (CATAPRES) 0.1 MG tablet TAKE 1 TABLET (0.1 MG TOTAL) BY MOUTH 2 (TWO) TIMES DAILY. 01/25/16  Yes Gwenlyn Found Copland, MD  Fluticasone-Salmeterol (ADVAIR DISKUS) 500-50 MCG/DOSE AEPB INHALE 1 PUFF BY MOUTH INTO THE LUNGS 2 TIMES DAILY. 01/25/16  Yes Jessica C Copland, MD  lisinopril (PRINIVIL,ZESTRIL) 10 MG tablet TAKE 1 TABLET (10 MG) BY MOUTH DAILY. 01/25/16  Yes Gwenlyn Found Copland, MD  ticagrelor (BRILINTA) 90 MG TABS tablet Take 90 mg by mouth 2 (two) times daily. 1 and 1/2   Yes Historical Provider, MD  tiotropium (SPIRIVA HANDIHALER) 18 MCG inhalation capsule PLACE 1 CAPSULE INTO INHALER AND INHALE INTO LUNGS DAILY. 01/25/16  Yes Gwenlyn Found Copland, MD  Multiple Vitamin (MULTIVITAMIN WITH MINERALS) TABS tablet Take 1 tablet by mouth daily. 12/08/13   Elease Etienne, MD     Vital Signs: BP 135/87   Pulse 78   Temp 98.2 F (36.8 C) (Oral)   Resp 13   Ht 6'  (1.829 m)   Wt 194 lb 14.2 oz (88.4 kg)   SpO2 96%   BMI 26.43 kg/m   Physical Exam  Constitutional: He is oriented to person, place, and time.  Appropriate  Face symmetrical smile =  Genitourinary:  Genitourinary Comments: Urine output great Blood tinged  Musculoskeletal: Normal range of motion.  Left hand slow but better today Good strength Rt groin sheath intact No hematoma Tender to touch Soft  Rt foot 2+ pulses   Neurological: He is alert and oriented to person, place, and time.  Skin: Skin is warm and dry.  Psychiatric: He has a normal mood and affect. His behavior is normal. Judgment and thought content normal.  Nursing note and vitals reviewed.   Imaging: Ct Head Code Stroke Wo Contrast`  Result Date: 03/27/2016 CLINICAL DATA:  Code stroke. 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 Initial encounter. EXAM: CT HEAD WITHOUT CONTRAST TECHNIQUE: Contiguous axial images were obtained from the base of the skull through the vertex without intravenous contrast. COMPARISON:  CTA head and neck 01/22/2016. FINDINGS: Stable paranasal sinuses and mastoids. No acute osseous abnormality identified. No acute orbit or scalp soft tissue findings. New right ICA terminus region metal coil pack with mild streak artifact. There is some intravascular contrast present, from the recent endovascular procedure. No acute intracranial hemorrhage is identified. Gray-white matter differentiation appears stable and normal. No cortically based acute infarct identified. ASPECTS [facility] Stroke Program Early  CT Score) Total score (0-10 with 10 being normal): 10. No ventriculomegaly.  No abnormal enhancement identified. IMPRESSION: 1. No acute cortically based infarct, acute intracranial hemorrhage, or acute intracranial abnormality identified. 2. ASPECTS is 10. 3. Right MCA bifurcation region coil pack. No adverse  features identified. I briefly discussed this by telephone with Provider Raynelle Fanning in the [facility] department on 03/27/2016 at 14:02 . She advises that the patient has symptoms had rapidly improved and the code stroke was canceled. I left a message for her to relay that this exam looks negative and for Dr. Julieanne Cotton to call me back with any questions. Electronically Signed   By: Odessa Fleming M.D.   On: 03/27/2016 14:04    Labs:  CBC:  Recent Labs  02/08/16 1227 02/19/16 0946 03/26/16 1244 03/28/16 0545  WBC 14.0* 7.4 12.2* 9.2  HGB 12.7* 12.8* 12.9* 11.4*  HCT 37.7* 40.4 40.4 35.5*  PLT 277.0 263 274 257    COAGS:  Recent Labs  02/19/16 0946 03/26/16 1244  INR 0.97 0.99  APTT 30 31    BMP:  Recent Labs  01/20/16 0520 02/08/16 1227 02/19/16 0946 03/26/16 1244 03/28/16 0545  NA 134* 132* 138 134* 134*  K 4.5 4.6 4.3 4.0 4.1  CL 102 100 109 106 107  CO2 23 26 25  20* 23  GLUCOSE 127* 111* 104* 137* 107*  BUN 25* 23 19 13 10   CALCIUM 9.4 9.6 9.6 9.1 8.7*  CREATININE 1.32* 1.29 1.23 1.36* 1.14  GFRNONAA 59*  --  >60 57* >60  GFRAA >60  --  >60 >60 >60    LIVER FUNCTION TESTS:  Recent Labs  08/29/15 0948 01/19/16 1110 02/19/16 0946 03/26/16 1244  BILITOT 0.6 0.5 0.9 0.8  AST 20 27 30 24   ALT 22 25 44 22  ALKPHOS 65 60 62 65  PROT 6.8 7.5 6.8 6.6  ALBUMIN 4.3 4.1 4.0 3.9    Assessment and Plan:  R MCA aneurysm embolization/coiling 8/23 in IR with Dr 03/28/16 to be removed today Plan for poss DC if stable Advance diet Dc foley  MRI Brain w/o Cx per Dr   Electronically Signed: 9/23 A 03/28/2016, 8:51 AM   I spent a total of 25 Minutes at the the patient's bedside AND on the patient's hospital floor or unit, greater than 50% of which was counseling/coordinating care for R MCA aneurysm coiling

## 2016-03-28 NOTE — Progress Notes (Signed)
Pt transporting to MRI with transporter Jaclynn Major, off telemetry, with MD's orders. E link has been notified.  Derek Conner

## 2016-03-28 NOTE — Care Management Note (Signed)
Case Management Note  Patient Details  Name: Derek Conner MRN: [id number] Date of Birth: [birth year]  Subjective/Objective: Pt admitted on 03/27/16 s/p Rt MCA coiling.  PTA, pt resides at home with spouse.                     Action/Plan: Will follow for discharge planning as pt progresses.    Expected Discharge Date:                  Expected Discharge Plan:  Home/Self Care  In-House Referral:     Discharge planning Services  CM Consult  Post Acute Care Choice:    Choice offered to:     DME Arranged:    DME Agency:     HH Arranged:    HH Agency:     Status of Service:  In process, will continue to follow  If discussed at Long Length of Stay Meetings, dates discussed:    Additional Comments:  Quintella Baton, RN, BSN  Trauma/Neuro ICU Case Manager [phone]

## 2016-03-29 ENCOUNTER — Encounter (HOSPITAL_COMMUNITY): Payer: Self-pay | Admitting: Interventional Radiology

## 2016-03-29 LAB — PLATELET INHIBITION P2Y12: PLATELET FUNCTION P2Y12: 82 [PRU] — AB (ref 194–418)

## 2016-03-29 NOTE — Progress Notes (Signed)
Discharge orders received, pt for discharge home today. IV D/C with bandaid CDI to R groin area.  D/C instructions and Rx given with verbalized understanding.  Family at bedside to assist pt with discharge. Staff brought pt downstairs via wheelchair.

## 2016-03-29 NOTE — Discharge Summary (Signed)
Patient ID: Derek Conner MRN: [id number] DOB/AGE: [birth year] 55 y.o.  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 Pt tolerated procedure well but was noted to develop Left hand weakness and dis coordination of same post procedure. CT post procedure:  IMPRESSION: 1. No acute cortically based infarct, acute intracranial hemorrhage, or acute intracranial abnormality identified. 2. ASPECTS is 10. 3. Right MCA bifurcation region coil pack. No adverse features Identified.  Admitted to Neuro ICU and was planned for discharge 8/24. Left hand coordination and use better. Dr Corliss Skains ordered MRI Head for 8/24---was performed late pm. MRI 8/24:  IMPRESSION: 1. Multifocal acute cortical ischemia within the right hemisphere, at multiple sites within the right MCA distribution. The largest areas are along the right middle frontal, superior frontal and prefrontal gyri. 2. No evidence of hemorrhagic conversion. 3. No midline shift or mass effect.  Dr Corliss Skains has seen and examined pt this am. All neuro intact Left hand gradually better daily. Wife and pt understand discharge instructions    Consults: Physical Therapy                   Occupational Therapy  Significant Diagnostic Studies: Cerebral arteriogram  Treatments:  CEREBRAL ANGIOGRAM [BWG6659 (Custom)]       S/P bilateral common carotid arteriograms. RT CFA approach. S/P near complete obliteration of Rt MCA aneurysm with primary coiling.Also use of 7.2 mg of superselective  INTEGRELIN       Discharge Exam: Blood pressure (!) 161/94, pulse 78, temperature 98.6 F (37 C), temperature source  Oral, resp. rate 17, height 6' (1.829 m), weight 194 lb 14.2 oz (88.4 kg), SpO2 97 %.  PE: A/O; appropriate EOMI Face symmetrical; smile= Smile = Heart: RRR Lungs: CTA Abd: soft; +BS; NT FROM All extremities with good movement and = Left hand is better daily; slow coordination; improving Gait steady Rt groin clean and dry; NT No hematoma Rt foot 2+ pulses UOP great; clear yellow  Results for orders placed or performed during the hospital encounter of 03/27/16  MRSA PCR Screening  Result Value Ref Range   MRSA by PCR NEGATIVE NEGATIVE  Platelet inhibition p2y12 (Not at [facility])  Result Value Ref Range   Platelet Function  P2Y12 82 (L) 194 - 418 PRU  Heparin level (unfractionated)  Result Value Ref Range   Heparin Unfractionated 0.64 0.30 - 0.70 IU/mL  Basic metabolic panel  Result Value Ref Range   Sodium 134 (L) 135 - 145 mmol/L   Potassium 4.1 3.5 - 5.1 mmol/L   Chloride 107 101 - 111 mmol/L   CO2 23 22 - 32 mmol/L   Glucose, Bld 107 (H) 65 - 99 mg/dL   BUN 10 6 - 20 mg/dL   Creatinine, Ser 9.35 0.61 - 1.24 mg/dL   Calcium 8.7 (L) 8.9 - 10.3 mg/dL   GFR calc non Af Amer >60 >60 mL/min   GFR calc Af Amer >60 >60 mL/min   Anion gap 4 (L) 5 - 15  CBC WITH DIFFERENTIAL  Result Value Ref Range   WBC 9.2 4.0 - 10.5 K/uL   RBC 3.97 (L) 4.22 - 5.81 MIL/uL   Hemoglobin 11.4 (L) 13.0 - 17.0 g/dL   HCT 70.1 (L) 77.9 - 39.0 %  MCV 89.4 78.0 - 100.0 fL   MCH 28.7 26.0 - 34.0 pg   MCHC 32.1 30.0 - 36.0 g/dL   RDW 58.0 99.8 - 33.8 %   Platelets 257 150 - 400 K/uL   Neutrophils Relative % 60 %   Neutro Abs 5.5 1.7 - 7.7 K/uL   Lymphocytes Relative 24 %   Lymphs Abs 2.2 0.7 - 4.0 K/uL   Monocytes Relative 11 %   Monocytes Absolute 1.0 0.1 - 1.0 K/uL   Eosinophils Relative 5 %   Eosinophils Absolute 0.5 0.0 - 0.7 K/uL   Basophils Relative 0 %   Basophils Absolute 0.0 0.0 - 0.1 K/uL  Platelet inhibition p2y12 (Not at [facility])  Result Value Ref Range   Platelet Function   P2Y12 113 (L) 194 - 418 PRU  Platelet inhibition p2y12 (Not at [facility])  Result Value Ref Range   Platelet Function  P2Y12 82 (L) 194 - 418 PRU     Disposition: R middle cerebral artery aneurysm embolization/coiling 03/27/16 Left hand weakness and dis coordination post procedure---resolving nicely Continue all home meds Continue Brilinta 45 mg BID; ASA 81 mg daily PT/OT exercises given to patient. For follow up with Dr Corliss Skains 2 weeks---pt will hear from scheduler for time and date. Call [phone] with questions or concerns Pt and wife leave here with good understanding of plan Stop smoking    Discharge Instructions    AMB Referral to [facility] Care Management    Complete by:  As directed   Please assign UMR member for post toc. Likely dc today 03/29/16. Thanks. Raiford Noble, MSN-Ed, RN,BSN-[facility] Liaison-[phone]   Reason for consult:  Please assign UMR member for post toc   Expected date of contact:  1-3 days (reserved for hospital discharges)   Call MD for:  difficulty breathing, headache or visual disturbances    Complete by:  As directed   Call MD for:  extreme fatigue    Complete by:  As directed   Call MD for:  hives    Complete by:  As directed   Call MD for:  persistant dizziness or light-headedness    Complete by:  As directed   Call MD for:  persistant nausea and vomiting    Complete by:  As directed   Call MD for:  redness, tenderness, or signs of infection (pain, swelling, redness, odor or green/yellow discharge around incision site)    Complete by:  As directed   Call MD for:  severe uncontrolled pain    Complete by:  As directed   Call MD for:  temperature >100.4    Complete by:  As directed   Diet - low sodium heart healthy    Complete by:  As directed   Discharge instructions    Complete by:  As directed   Continue all meds; 2 week follow up with Dr Corliss Skains- call [phone] if questions or concerns   Discharge wound care:    Complete by:  As  directed   May shower 8/25; change band aid daily to Rt groin site x 1 week   Driving Restrictions    Complete by:  As directed   No driving 2 weeks   Increase activity slowly    Complete by:  As directed   Lifting restrictions    Complete by:  As directed   No lifting over 10 lbs x 2 weeks       Medication List    STOP taking these medications  fluticasone furoate-vilanterol 100-25 MCG/INH Aepb Commonly known as:  BREO ELLIPTA     TAKE these medications   albuterol 108 (90 Base) MCG/ACT inhaler Commonly known as:  PROVENTIL HFA;VENTOLIN HFA Inhale 2 puffs into the lungs every 6 (six) hours as needed for wheezing or shortness of breath.   amLODipine 10 MG tablet Commonly known as:  NORVASC TAKE 1 TABLET (10 MG) BY MOUTH DAILY.   aspirin EC 81 MG tablet Take 81 mg by mouth daily.   atorvastatin 20 MG tablet Commonly known as:  LIPITOR Take 1 tablet (20 mg total) by mouth daily.   cloNIDine 0.1 MG tablet Commonly known as:  CATAPRES TAKE 1 TABLET (0.1 MG TOTAL) BY MOUTH 2 (TWO) TIMES DAILY.   Fluticasone-Salmeterol 500-50 MCG/DOSE Aepb Commonly known as:  ADVAIR DISKUS INHALE 1 PUFF BY MOUTH INTO THE LUNGS 2 TIMES DAILY.   lisinopril 10 MG tablet Commonly known as:  PRINIVIL,ZESTRIL TAKE 1 TABLET (10 MG) BY MOUTH DAILY.   multivitamin with minerals Tabs tablet Take 1 tablet by mouth daily.   ticagrelor 90 MG Tabs tablet Commonly known as:  BRILINTA Take 90 mg by mouth 2 (two) times daily. 1 and 1/2   tiotropium 18 MCG inhalation capsule Commonly known as:  SPIRIVA HANDIHALER PLACE 1 CAPSULE INTO INHALER AND INHALE INTO LUNGS DAILY.      Follow-up Information    DEVESHWAR, Grandville Silos, MD Follow up in 2 week(s).   Specialty:  Interventional Radiology Why:  follow up with Dr Corliss Skains 2 weeks; pt will hear from scheduler for time and date; call [phone] if questions or concerns Contact information: [address] [place]  [address] [phone]            Electronically Signed: Ralene Muskrat A 03/29/2016, 9:44 AM   I have spent Greater Than 30 Minutes discharging Derek Conner.

## 2016-03-29 NOTE — Progress Notes (Signed)
Referring Physician(s): Dr Ritta Slot  Supervising Physician: Julieanne Cotton  Patient Status:  Inpatient  Chief Complaint:  R MCA aneurysm Coiling 8/23  Subjective:  Left hand weakness/ coordination better daily Mild headache this am Eating well; sept well Urinating and without blood Passing gas Has been out of bed Gait steady MRI 8/24 pm IMPRESSION: 1. Multifocal acute cortical ischemia within the right hemisphere, at multiple sites within the right MCA distribution. The largest areas are along the right middle frontal, superior frontal and prefrontal gyri. 2. No evidence of hemorrhagic conversion. 3. No midline shift or mass effect  Allergies: Symbicort [budesonide-formoterol fumarate] and Dulera [mometasone furo-formoterol fum]  Medications: Prior to Admission medications   Medication Sig Start Date End Date Taking? Authorizing Provider  albuterol (PROVENTIL HFA;VENTOLIN HFA) 108 (90 Base) MCG/ACT inhaler Inhale 2 puffs into the lungs every 6 (six) hours as needed for wheezing or shortness of breath. 01/25/16  Yes Jessica C Copland, MD  amLODipine (NORVASC) 10 MG tablet TAKE 1 TABLET (10 MG) BY MOUTH DAILY. 01/25/16  Yes Gwenlyn Found Copland, MD  aspirin EC 81 MG tablet Take 81 mg by mouth daily.   Yes Historical Provider, MD  atorvastatin (LIPITOR) 20 MG tablet Take 1 tablet (20 mg total) by mouth daily. 06/28/15  Yes Edward Saguier, PA-C  cloNIDine (CATAPRES) 0.1 MG tablet TAKE 1 TABLET (0.1 MG TOTAL) BY MOUTH 2 (TWO) TIMES DAILY. 01/25/16  Yes Gwenlyn Found Copland, MD  Fluticasone-Salmeterol (ADVAIR DISKUS) 500-50 MCG/DOSE AEPB INHALE 1 PUFF BY MOUTH INTO THE LUNGS 2 TIMES DAILY. 01/25/16  Yes Jessica C Copland, MD  lisinopril (PRINIVIL,ZESTRIL) 10 MG tablet TAKE 1 TABLET (10 MG) BY MOUTH DAILY. 01/25/16  Yes Gwenlyn Found Copland, MD  ticagrelor (BRILINTA) 90 MG TABS tablet Take 90 mg by mouth 2 (two) times daily. 1 and 1/2   Yes Historical Provider, MD    tiotropium (SPIRIVA HANDIHALER) 18 MCG inhalation capsule PLACE 1 CAPSULE INTO INHALER AND INHALE INTO LUNGS DAILY. 01/25/16  Yes Gwenlyn Found Copland, MD  Multiple Vitamin (MULTIVITAMIN WITH MINERALS) TABS tablet Take 1 tablet by mouth daily. 12/08/13   Elease Etienne, MD     Vital Signs: BP (!) 151/90   Pulse 78   Temp 98.2 F (36.8 C) (Oral)   Resp 19   Ht 6' (1.829 m)   Wt 194 lb 14.2 oz (88.4 kg)   SpO2 97%   BMI 26.43 kg/m   Physical Exam  Constitutional: He is oriented to person, place, and time. He appears well-nourished.  Eyes: EOM are normal.  Neck: Neck supple.  Musculoskeletal: Normal range of motion. He exhibits no edema.  Moving left hand better and better Good strength  Neurological: He is alert and oriented to person, place, and time.  Skin: Skin is warm and dry.  Psychiatric: He has a normal mood and affect. His behavior is normal. Judgment and thought content normal.  Nursing note and vitals reviewed.   Imaging: Mr Brain Wo Contrast  Result Date: 03/28/2016 CLINICAL DATA:  Right MCA aneurysm coiling 8/23. Left hand weakness. EXAM: MRI HEAD WITHOUT CONTRAST TECHNIQUE: Multiplanar, multiecho pulse sequences of the brain and surrounding structures were obtained without intravenous contrast. COMPARISON:  Head CT 03/27/2016 FINDINGS: Brain Parenchyma: There are multiple areas of cortical diffusion restriction within the right frontal lobe, right parietal lobe the right occipital lobe. There is a punctate focus of diffusion restriction within the left parietal lobe, at the medial aspect of the left postcentral gyrus.  There is no evidence of acute hemorrhage. Susceptibility from an aneurysm coil at the right MCA bifurcation is noted. There is hyperintense T2 weighted signal along the cortex in the areas of diffusion restriction. No mass lesion or midline shift. The midline structures are normal. Ventricles, Sulci and Extra-axial Spaces: Normal for age. No extra-axial  collection. Paranasal Sinuses and Mastoids: No fluid levels or advanced mucosal thickening. Orbits: Normal. Bones and Soft Tissues: The visualized skull base, calvarium and extracranial soft tissues are normal. IMPRESSION: 1. Multifocal acute cortical ischemia within the right hemisphere, at multiple sites within the right MCA distribution. The largest areas are along the right middle frontal, superior frontal and prefrontal gyri. 2. No evidence of hemorrhagic conversion. 3. No midline shift or mass effect. Electronically Signed   By: Deatra Robinson M.D.   On: 03/28/2016 22:08   Ct Head Code Stroke Wo Contrast`  Result Date: 03/27/2016 CLINICAL DATA:  Code stroke. 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 Initial encounter. EXAM: CT HEAD WITHOUT CONTRAST TECHNIQUE: Contiguous axial images were obtained from the base of the skull through the vertex without intravenous contrast. COMPARISON:  CTA head and neck 01/22/2016. FINDINGS: Stable paranasal sinuses and mastoids. No acute osseous abnormality identified. No acute orbit or scalp soft tissue findings. New right ICA terminus region metal coil pack with mild streak artifact. There is some intravascular contrast present, from the recent endovascular procedure. No acute intracranial hemorrhage is identified. Gray-white matter differentiation appears stable and normal. No cortically based acute infarct identified. ASPECTS [facility] Stroke Program Early CT Score) Total score (0-10 with 10 being normal): 10. No ventriculomegaly.  No abnormal enhancement identified. IMPRESSION: 1. No acute cortically based infarct, acute intracranial hemorrhage, or acute intracranial abnormality identified. 2. ASPECTS is 10. 3. Right MCA bifurcation region coil pack. No adverse features identified. I briefly discussed this by telephone with Provider Raynelle Fanning in the [facility] department on 03/27/2016 at 14:02  . She advises that the patient has symptoms had rapidly improved and the code stroke was canceled. I left a message for her to relay that this exam looks negative and for Dr. Julieanne Cotton to call me back with any questions. Electronically Signed   By: Odessa Fleming M.D.   On: 03/27/2016 14:04    Labs:  CBC:  Recent Labs  02/08/16 1227 02/19/16 0946 03/26/16 1244 03/28/16 0545  WBC 14.0* 7.4 12.2* 9.2  HGB 12.7* 12.8* 12.9* 11.4*  HCT 37.7* 40.4 40.4 35.5*  PLT 277.0 263 274 257    COAGS:  Recent Labs  02/19/16 0946 03/26/16 1244  INR 0.97 0.99  APTT 30 31    BMP:  Recent Labs  01/20/16 0520 02/08/16 1227 02/19/16 0946 03/26/16 1244 03/28/16 0545  NA 134* 132* 138 134* 134*  K 4.5 4.6 4.3 4.0 4.1  CL 102 100 109 106 107  CO2 23 26 25  20* 23  GLUCOSE 127* 111* 104* 137* 107*  BUN 25* 23 19 13 10   CALCIUM 9.4 9.6 9.6 9.1 8.7*  CREATININE 1.32* 1.29 1.23 1.36* 1.14  GFRNONAA 59*  --  >60 57* >60  GFRAA >60  --  >60 >60 >60    LIVER FUNCTION TESTS:  Recent Labs  08/29/15 0948 01/19/16 1110 02/19/16 0946 03/26/16 1244  BILITOT 0.6 0.5 0.9 0.8  AST 20 27 30 24   ALT 22 25 44 22  ALKPHOS 65 60 62 65  PROT 6.8 7.5 6.8 6.6  ALBUMIN 4.3 4.1 4.0 3.9    Assessment and Plan:  R MCA aneurysm embolization 8/23 Residual left hand slow coordination---better daily Plan for discharge today Dr Corliss Skains to see pt asap  Electronically Signed: Uriel Dowding A 03/29/2016, 8:14 AM   I spent a total of 15 Minutes at the the patient's bedside AND on the patient's hospital floor or unit, greater than 50% of which was counseling/coordinating care for R MCA aneurysm coiling

## 2016-03-29 NOTE — Consult Note (Addendum)
   [facility] CM Inpatient Consult   03/29/2016  Derek Conner 1961/07/14 [id number]   Bedside visit made on behalf of Link to [facility] Care Management program for [employer] employees/dependents with [facility] insurance. Mr. Cartmell denies having any current Link to Wellness needs.  Wife at bedside states that Mr. Greenwalt manages well at home and does not need Link to Wellness information. Explained that information is relayed to make aware of the program as a benefit of [employer]. Explained that he will receive post hospital follow up call. Link to [employer] and contact information left at bedside. Confirmed best contact number as [phone].  Raiford Noble, MSN-Ed, RN,BSN [facility] Liaison [phone]

## 2016-03-29 NOTE — Progress Notes (Addendum)
Patient ID: Derek Conner, male   DOB: [birth year], 55 y.o.   MRN: [id number] Post procedure day 2 . Clinically patient significantly better except for difficulty with fine motor movements with lt hand fingers. Still hasdifficulty holding a fork,and writing.say sensation is equal on both sides. Motor ,coordination and station and gait with  No difficulty. Denies any H/A,N/V or visual problems. VS stable. Plan . 1.D/C home after seen by OT regarding his Lt hand. 2.Leave on aspirin 81mg  and brilinta 45 mg BID.Continue home meds. .. 3.Strongly advised to stop smoking tobacco and marijuana . 4.Drink water to maintain hydration. 5.RTC in 2 weeks. 6.Check P2 Y 12 . D/W spouse and patient Brain MRI results.Depicts DWI tiny focal  Diffusion restrictions C/W microembolic ischemia. Possible mechanism reviewed with patient and spouse. Questions answered to their satisfaction. Dr Marland Kitchen

## 2016-03-29 NOTE — Evaluation (Signed)
Occupational Therapy Evaluation and Discharge Patient Details Name: Derek Conner MRN: [id number] DOB: 01/28/1961 Today's Date: 03/29/2016    History of Present Illness patient is a 55 yo male s/p R MCA aneurysm embolization/coiling 8/23 in IR    Clinical Impression   Pt reports he was independent with ADL PTA. Currently pt is overall mod I with ADL and functional mobility. Pt presenting with L hand fine/gross motor coordination impacting his independence with ADL and work activities. Educated pt on fine/gross motor coordination exercises and provided handout. At this time, feel pt does not need OT f/u but discussed potential for outpatient OT follow up with pt and family if they feel pt is not progressing quickly. No further acute OT needs identified; signing off at this time.     Follow Up Recommendations  No OT follow up;Supervision - Intermittent    Equipment Recommendations  None recommended by OT    Recommendations for Other Services       Precautions / Restrictions Restrictions Weight Bearing Restrictions: No      Mobility Bed Mobility Overal bed mobility: Independent                Transfers Overall transfer level: Independent                    Balance Overall balance assessment: No apparent balance deficits (not formally assessed)                                        ADL Overall ADL's : Modified independent                                       General ADL Comments: Increased time required for functional tasks. Pt with slight unsteadiness noted with simulated tub transfer; educated on holding onto wall for stability.     Vision Vision Assessment?: No apparent visual deficits   Perception     Praxis      Pertinent Vitals/Pain Pain Assessment: No/denies pain     Hand Dominance Left   Extremity/Trunk Assessment Upper Extremity Assessment Upper Extremity Assessment: LUE deficits/detail LUE  Deficits / Details: Strength, AROM, and sensation WFL. Decreased fine/gross motor coordination. LUE Coordination: decreased fine motor;decreased gross motor   Lower Extremity Assessment Lower Extremity Assessment: Overall WFL for tasks assessed   Cervical / Trunk Assessment Cervical / Trunk Assessment: Normal   Communication Communication Communication: No difficulties   Cognition Arousal/Alertness: Awake/alert Behavior During Therapy: WFL for tasks assessed/performed Overall Cognitive Status: Within Functional Limits for tasks assessed                     General Comments       Exercises Exercises: Other exercises Other Exercises Other Exercises: Educated pt on fine and gross motor coordination exercises and provided handout.   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; typing, writing    OT Diagnosis: Generalized weakness   OT Problem List:     OT Treatment/Interventions:      OT Goals(Current goals can be found in the care plan section) Acute Rehab OT Goals Patient Stated Goal: to go home OT Goal Formulation: All assessment and education complete, DC therapy  OT Frequency:     Barriers to D/C:            Co-evaluation PT/OT/SLP Co-Evaluation/Treatment: Yes Reason for Co-Treatment: Other (comment) (for imminent d/c needs)   OT goals addressed during session: ADL's and self-care;Strengthening/ROM      End of Session Nurse Communication: Mobility status  Activity Tolerance: Patient tolerated treatment well Patient left: in bed;with call  bell/phone within reach;with family/visitor present   Time: 7124-5809 OT Time Calculation (min): 16 min Charges:  OT General Charges $OT Visit: 1 Procedure OT Evaluation $OT Eval Low Complexity: 1 Procedure G-Codes:     Gaye Alken M.S., OTR/L Pager: [phone]  03/29/2016, 9:54 AM

## 2016-03-29 NOTE — Evaluation (Signed)
Physical Therapy Evaluation Patient Details Name: Derek Conner MRN: [id number] DOB: 13-Sep-1960 Today's Date: 03/29/2016   History of Present Illness  patient is a 55 yo male s/p R MCA aneurysm embolization/coiling 8/23 in IR   Clinical Impression  Patient seen for mobility assessment prior to d/c home. Patient mobilizing well, no significant deficits noted. Performed ambulation and stair negotiation as well as higher level balance tasks without difficulty. Educated regarding mobility expectations for discharge. No further acute PT needs, will sign off.     Follow Up Recommendations No PT follow up    Equipment Recommendations  None recommended by PT    Recommendations for Other Services       Precautions / Restrictions Restrictions Weight Bearing Restrictions: No      Mobility  Bed Mobility Overal bed mobility: Independent                Transfers Overall transfer level: Independent                  Ambulation/Gait Ambulation/Gait assistance: Independent Ambulation Distance (Feet): 380 Feet Assistive device: None Gait Pattern/deviations: WFL(Within Functional Limits)        Stairs Stairs: Yes Stairs assistance: Modified independent (Device/Increase time) Stair Management: Forwards Number of Stairs: 6    Wheelchair Mobility    Modified Rankin (Stroke Patients Only)       Balance Overall balance assessment: Independent                               Standardized Balance Assessment Standardized Balance Assessment : Dynamic Gait Index   Dynamic Gait Index Level Surface: Normal Change in Gait Speed: Normal Gait with Horizontal Head Turns: Mild Impairment Gait with Vertical Head Turns: Normal Gait and Pivot Turn: Normal Step Over Obstacle: Normal Step Around Obstacles: Normal Steps: Mild Impairment Total Score: 22       Pertinent Vitals/Pain Pain Assessment: No/denies pain    Home Living Family/patient expects to  be discharged to:: Private residence Living Arrangements: Spouse/significant other Available Help at Discharge: Family           Home Equipment: None      Prior Function Level of Independence: Independent         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 Decreased fine/gross motor coordination.   Lower Extremity Assessment: Overall WFL for tasks assessed         Communication   Communication: No difficulties  Cognition Arousal/Alertness: Awake/alert Behavior During Therapy: WFL for tasks assessed/performed Overall Cognitive Status: Within Functional Limits for tasks assessed                      General Comments      Exercises        Assessment/Plan    PT Assessment    PT Diagnosis     PT Problem List    PT Treatment Interventions     PT Goals (Current goals can be found in the Care Plan section) Acute Rehab PT Goals Patient Stated Goal: to go home PT Goal Formulation: All assessment and education complete, DC therapy    Frequency     Barriers to discharge  Co-evaluation               End of Session   Activity Tolerance: Patient tolerated treatment well Patient left:  (with OT) Nurse Communication: Mobility status    Functional Assessment Tool Used: clinical judgement Functional Limitation: Mobility: Walking and moving around Mobility: Walking and Moving Around Current Status [phone]): 0 percent impaired, limited or restricted Mobility: Walking and Moving Around Goal Status [phone]): 0 percent impaired, limited or restricted Mobility: Walking and Moving Around Discharge Status [phone]): 0 percent impaired, limited or restricted    Time: 0926-0940 PT Time Calculation (min) (ACUTE ONLY): 14  min   Charges:   PT Evaluation $PT Eval Low Complexity: 1 Procedure     PT G Codes:   PT G-Codes **NOT FOR INPATIENT CLASS** Functional Assessment Tool Used: clinical judgement Functional Limitation: Mobility: Walking and moving around Mobility: Walking and Moving Around Current Status ([id number]): 0 percent impaired, limited or restricted Mobility: Walking and Moving Around Goal Status ([id number]): 0 percent impaired, limited or restricted Mobility: Walking and Moving Around Discharge Status [phone]): 0 percent impaired, limited or restricted    Fabio Asa 03/29/2016, 9:50 AM Charlotte Crumb, PT DPT  [phone]

## 2016-04-01 ENCOUNTER — Encounter (HOSPITAL_COMMUNITY): Payer: Self-pay | Admitting: Interventional Radiology

## 2016-04-01 ENCOUNTER — Other Ambulatory Visit (HOSPITAL_COMMUNITY): Payer: Self-pay | Admitting: Interventional Radiology

## 2016-04-01 ENCOUNTER — Other Ambulatory Visit: Payer: Self-pay | Admitting: *Deleted

## 2016-04-01 DIAGNOSIS — I671 Cerebral aneurysm, nonruptured: Secondary | ICD-10-CM

## 2016-04-01 NOTE — Patient Outreach (Addendum)
[facility]) Care Management  04/01/2016  Derek Conner 1961/02/25 [id number]   Subjective: Telephone call to patient's home / mobile number, spoke with patient, and HIPAA verified.  Discussed [facility] Care Management [facility] Transition of care follow up and care management services.  Patient states he is doing well, does not have any transition of care, Link to Wellness,  or care management needs at this time.   Patient states he is utilizing the outpatient pharmacy and other [place] benefits as needed.   Patient states he is appreciative of the call and disconnected the call.   Objective: Per chart review: Patient hospitalized  03/27/16 - 03/29/16 for Brain aneurysm.  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  Patient also has a history of hypertension, COPD, and Rheumatoid arthritis.  Assessment: Received [facility]MR Transition of Care referral on 03/29/16.   Transition of care follow up completed per patient's request and no needs identified.   RNCM will proceed with case closure.  Plan:  RNCM will send case closure due to follow up completed / no care management needs to Iverson Alamin at [facility] Care Management.     Georganna Maxson H. Gardiner Barefoot, BSN, CCM [facility] Care Management [facility] Telephonic CM Phone: [phone] Fax: [phone]

## 2016-04-02 ENCOUNTER — Other Ambulatory Visit (HOSPITAL_COMMUNITY): Payer: Self-pay | Admitting: Interventional Radiology

## 2016-04-02 DIAGNOSIS — I671 Cerebral aneurysm, nonruptured: Secondary | ICD-10-CM

## 2016-04-03 LAB — ALPHA-1 ANTITRYPSIN PHENOTYPE: A1 ANTITRYPSIN: 140 mg/dL (ref 83–199)

## 2016-04-03 NOTE — Progress Notes (Signed)
Called and spoke to pt. Informed him of the results per MR. Pt verbalized understanding and denied any further questions or concerns at this time.  

## 2016-04-12 ENCOUNTER — Other Ambulatory Visit: Payer: Self-pay | Admitting: Radiology

## 2016-04-12 ENCOUNTER — Ambulatory Visit (HOSPITAL_COMMUNITY)
Admission: RE | Admit: 2016-04-12 | Discharge: 2016-04-12 | Disposition: A | Discharge status: Home / Self Care | Payer: 59 | Source: Ambulatory Visit | Attending: Interventional Radiology | Admitting: Interventional Radiology

## 2016-04-12 DIAGNOSIS — R51 Headache: Secondary | ICD-10-CM | POA: Diagnosis not present

## 2016-04-12 DIAGNOSIS — I671 Cerebral aneurysm, nonruptured: Secondary | ICD-10-CM | POA: Diagnosis not present

## 2016-04-12 HISTORY — PX: IR GENERIC HISTORICAL: IMG1180011

## 2016-04-12 LAB — PLATELET INHIBITION P2Y12: PLATELET FUNCTION P2Y12: 23 [PRU] — AB (ref 194–418)

## 2016-04-15 ENCOUNTER — Encounter (HOSPITAL_COMMUNITY): Payer: Self-pay | Admitting: Interventional Radiology

## 2016-04-15 ENCOUNTER — Telehealth: Payer: Self-pay | Admitting: Medical

## 2016-04-15 NOTE — Telephone Encounter (Signed)
Pt had some medical problems since last visit that will require 30 minute appointment to review when he come in for follow up. So can you  put a note in chart so schedulers can see.

## 2016-04-16 NOTE — Telephone Encounter (Signed)
Patients chart has been updated to 30 minute appointments only

## 2016-04-22 ENCOUNTER — Telehealth (HOSPITAL_COMMUNITY): Payer: Self-pay | Admitting: *Deleted

## 2016-04-22 NOTE — Telephone Encounter (Signed)
Called and left message on his work answering machine.  The cell phone listed was not in service.  I  Left a return phone number for him

## 2016-04-24 MED FILL — ADVAIR 500/50 DISKUS: 500-50 | 90 days supply | Qty: 180 | Fill #1

## 2016-04-24 MED FILL — LISINOPRIL 10 MG TABLET: 10 | 90 days supply | Qty: 90 | Fill #0

## 2016-04-24 MED FILL — AMLODIPINE BESYLATE 10 MG T: 10 | 90 days supply | Qty: 90 | Fill #0

## 2016-04-24 MED FILL — BRILINTA 90 MG TABLET: 90 | 30 days supply | Qty: 60 | Fill #1

## 2016-04-24 MED FILL — ATORVASTATIN 20 MG TABLET: 20 | 90 days supply | Qty: 90 | Fill #0

## 2016-05-13 ENCOUNTER — Ambulatory Visit (INDEPENDENT_AMBULATORY_CARE_PROVIDER_SITE_OTHER): Payer: 59 | Admitting: Adult Health

## 2016-05-13 ENCOUNTER — Encounter: Payer: Self-pay | Admitting: Adult Health

## 2016-05-13 DIAGNOSIS — J441 Chronic obstructive pulmonary disease with (acute) exacerbation: Secondary | ICD-10-CM

## 2016-05-13 MED ORDER — PREDNISONE 10 MG PO TABS: ORAL_TABLET | ORAL | 0 refills | Status: DC

## 2016-05-13 MED ORDER — DOXYCYCLINE HYCLATE 100 MG PO TABS: 100.0000 mg | ORAL_TABLET | Freq: Two times a day (BID) | ORAL | 0 refills | Status: DC

## 2016-05-13 MED FILL — DOXYCYCLINE HYCLATE 100 MG: 100 | 7 days supply | Qty: 14 | Fill #0

## 2016-05-13 MED FILL — predniSONE 10 MG TABS: 10 | 8 days supply | Qty: 20 | Fill #0

## 2016-05-13 NOTE — Patient Instructions (Signed)
Doxycycline 100mg  Twice daily  For 7 days, take with food. -wear sunscreen.  Mucinex DM Twice daily  As needed  Cough /congestion  Prednisone taper over next week.  Work on not smoking .  Follow up with Dr. in 3 months and As needed   If cough persists, touch base with Primary MD that Lisinopril may cause your cough to be worse.

## 2016-05-13 NOTE — Assessment & Plan Note (Signed)
Flare in smoker , discussed smoking cessation  Also on ACE if not improving may need to pick alternative.   Plan  Patient Instructions  Doxycycline 100mg  Twice daily  For 7 days, take with food. -wear sunscreen.  Mucinex DM Twice daily  As needed  Cough /congestion  Prednisone taper over next week.  Work on not smoking .  Follow up with Dr. in 3 months and As needed   If cough persists, touch base with Primary MD that Lisinopril may cause your cough to be worse.

## 2016-05-13 NOTE — Progress Notes (Signed)
Subjective:    Patient ID: Derek Conner, male    DOB: 1961-02-27, 55 y.o.   MRN: [id number]  HPI 55 yo male , Native American , smoker followed for severe COPD    05/13/2016 Acute OV  Pt presents for an acute office visit. Complains of one week of increased cough with thick yellow mucus, wheezing and shortness of breath. Patient denies any hemoptysis, fever, orthopnea, PND, or increased leg swelling. Patient is on ACE inhibitor. Says that he was doing well up until the last week without any flare of his cough. He remains on Advair and Spiriva. Complains of nasal congestion and drainage over the last 3-4 days. He does not get flu shots. He does continue to smoke. We discussed smoking cessation. 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  He says he has done well from this.     Past Medical History:  Diagnosis Date  . Asthma   . CAP (community acquired pneumonia) 01/19/2016  . Chronic bronchitis (HCC)   . COPD (chronic obstructive pulmonary disease) (HCC)   . Emphysema lung (HCC)   . Emphysema of lung (HCC)   . Hepatitis A infection ~ 1980  . High cholesterol   . Hypertension   . Pneumonia 1960s  . Rheumatoid arthritis (HCC)    "hands mostly"  ( 01/19/2016)  . Situational depression 11/22/2009   "only when my mother died"   Current Outpatient Prescriptions on File Prior to Visit  Medication Sig Dispense Refill  . albuterol (PROVENTIL HFA;VENTOLIN HFA) 108 (90 Base) MCG/ACT inhaler Inhale 2 puffs into the lungs every 6 (six) hours as needed for wheezing or shortness of breath. 3 Inhaler 3  . amLODipine (NORVASC) 10 MG tablet TAKE 1 TABLET (10 MG) BY MOUTH DAILY. 90 tablet 3  . aspirin EC 81 MG tablet Take 81 mg by mouth daily.    Marland Kitchen atorvastatin (LIPITOR) 20 MG tablet Take 1 tablet (20 mg total) by mouth daily. 30 tablet 2  . cloNIDine (CATAPRES) 0.1 MG tablet TAKE 1 TABLET (0.1 MG TOTAL) BY MOUTH 2 (TWO) TIMES DAILY. 180 tablet  3  . Fluticasone-Salmeterol (ADVAIR DISKUS) 500-50 MCG/DOSE AEPB INHALE 1 PUFF BY MOUTH INTO THE LUNGS 2 TIMES DAILY. 3 each 3  . lisinopril (PRINIVIL,ZESTRIL) 10 MG tablet TAKE 1 TABLET (10 MG) BY MOUTH DAILY. 90 tablet 3  . Multiple Vitamin (MULTIVITAMIN WITH MINERALS) TABS tablet Take 1 tablet by mouth daily. (Patient not taking: Reported on 05/13/2016) 30 tablet 0  . ticagrelor (BRILINTA) 90 MG TABS tablet Take 90 mg by mouth 2 (two) times daily. 1 and 1/2    . tiotropium (SPIRIVA HANDIHALER) 18 MCG inhalation capsule PLACE 1 CAPSULE INTO INHALER AND INHALE INTO LUNGS DAILY. 90 capsule 3   Current Facility-Administered Medications on File Prior to Visit  Medication Dose Route Frequency Provider Last Rate Last Dose  . 0.9 %  sodium chloride infusion   Intravenous Continuous Ralene Muskrat, PA-C      . aspirin EC tablet 325 mg  325 mg Oral 60 min Pre-Op Ralene Muskrat, PA-C      . niMODipine (NIMOTOP) capsule 0-60 mg  0-60 mg Oral 60 min Pre-Op Ralene Muskrat, PA-C          Review of Systems Constitutional:   No  weight loss, night sweats,  Fevers, chills, fatigue, or  lassitude.  HEENT:   No headaches,  Difficulty swallowing,  Tooth/dental problems, or  Sore throat,  No sneezing, itching, ear ache,  +nasal congestion, post nasal drip,   CV:  No chest pain,  Orthopnea, PND, swelling in lower extremities, anasarca, dizziness, palpitations, syncope.   GI  No heartburn, indigestion, abdominal pain, nausea, vomiting, diarrhea, change in bowel habits, loss of appetite, bloody stools.   Resp:    No chest wall deformity  Skin: no rash or lesions.  GU: no dysuria, change in color of urine, no urgency or frequency.  No flank pain, no hematuria   MS:  No joint pain or swelling.  No decreased range of motion.  No back pain.  Psych:  No change in mood or affect. No depression or anxiety.  No memory loss.           Objective:   Physical Exam Vitals:   05/13/16 1147  BP:  130/84  Pulse: 80  Temp: 98.2 F (36.8 C)  TempSrc: Oral  SpO2: 97%  Weight: 191 lb (86.6 kg)  Height: 6' (1.829 m)   GEN: A/Ox3; pleasant , NAD, well nourished    HEENT:  [place]/AT,  EACs-clear, TMs-wnl, NOSE-clear drainage , THROAT-clear, no lesions, no postnasal drip or exudate noted.   NECK:  Supple w/ fair ROM; no JVD; normal carotid impulses w/o bruits; no thyromegaly or nodules palpated; no lymphadenopathy.    RESP  Few trace exp wheezing , no accessory muscle use, no dullness to percussion  CARD:  RRR, no m/r/g  , no peripheral edema, pulses intact, no cyanosis or clubbing.  GI:   Soft & nt; nml bowel sounds; no organomegaly or masses detected.   Musco: Warm bil, no deformities or joint swelling noted.   Neuro: alert, no focal deficits noted.    Skin: Warm, no lesions or rashes  Tammy Parrett NP-C  [facility]  05/13/2016        Assessment & Plan:

## 2016-05-16 ENCOUNTER — Telehealth: Payer: Self-pay | Admitting: Internal Medicine

## 2016-05-16 ENCOUNTER — Inpatient Hospital Stay (HOSPITAL_COMMUNITY)
Admission: AD | Admit: 2016-05-16 | Discharge: 2016-05-19 | DRG: 192 | Disposition: A | Discharge status: Home / Self Care | Payer: 59 | Source: Ambulatory Visit | Attending: Internal Medicine | Admitting: Internal Medicine

## 2016-05-16 ENCOUNTER — Encounter (HOSPITAL_COMMUNITY): Payer: Self-pay | Admitting: *Deleted

## 2016-05-16 ENCOUNTER — Inpatient Hospital Stay (HOSPITAL_COMMUNITY): Payer: 59

## 2016-05-16 ENCOUNTER — Encounter: Payer: Self-pay | Admitting: Internal Medicine

## 2016-05-16 ENCOUNTER — Ambulatory Visit (INDEPENDENT_AMBULATORY_CARE_PROVIDER_SITE_OTHER): Payer: 59 | Admitting: Internal Medicine

## 2016-05-16 VITALS — BP 138/80 | HR 79 | Ht 72.0 in | Wt 188.0 lb

## 2016-05-16 DIAGNOSIS — R0602 Shortness of breath: Secondary | ICD-10-CM | POA: Diagnosis not present

## 2016-05-16 DIAGNOSIS — J449 Chronic obstructive pulmonary disease, unspecified: Secondary | ICD-10-CM

## 2016-05-16 DIAGNOSIS — R042 Hemoptysis: Secondary | ICD-10-CM

## 2016-05-16 DIAGNOSIS — R05 Cough: Secondary | ICD-10-CM | POA: Diagnosis not present

## 2016-05-16 DIAGNOSIS — R911 Solitary pulmonary nodule: Secondary | ICD-10-CM | POA: Diagnosis present

## 2016-05-16 DIAGNOSIS — Z01811 Encounter for preprocedural respiratory examination: Secondary | ICD-10-CM

## 2016-05-16 DIAGNOSIS — I10 Essential (primary) hypertension: Secondary | ICD-10-CM | POA: Diagnosis present

## 2016-05-16 DIAGNOSIS — R0789 Other chest pain: Secondary | ICD-10-CM

## 2016-05-16 DIAGNOSIS — J441 Chronic obstructive pulmonary disease with (acute) exacerbation: Principal | ICD-10-CM | POA: Diagnosis present

## 2016-05-16 DIAGNOSIS — I671 Cerebral aneurysm, nonruptured: Secondary | ICD-10-CM

## 2016-05-16 DIAGNOSIS — R739 Hyperglycemia, unspecified: Secondary | ICD-10-CM | POA: Diagnosis present

## 2016-05-16 DIAGNOSIS — Z7982 Long term (current) use of aspirin: Secondary | ICD-10-CM

## 2016-05-16 DIAGNOSIS — F17210 Nicotine dependence, cigarettes, uncomplicated: Secondary | ICD-10-CM | POA: Diagnosis present

## 2016-05-16 DIAGNOSIS — F419 Anxiety disorder, unspecified: Secondary | ICD-10-CM | POA: Diagnosis present

## 2016-05-16 DIAGNOSIS — E7800 Pure hypercholesterolemia, unspecified: Secondary | ICD-10-CM | POA: Diagnosis present

## 2016-05-16 DIAGNOSIS — T380X5A Adverse effect of glucocorticoids and synthetic analogues, initial encounter: Secondary | ICD-10-CM | POA: Diagnosis present

## 2016-05-16 DIAGNOSIS — Z8673 Personal history of transient ischemic attack (TIA), and cerebral infarction without residual deficits: Secondary | ICD-10-CM | POA: Diagnosis not present

## 2016-05-16 DIAGNOSIS — Z0000 Encounter for general adult medical examination without abnormal findings: Secondary | ICD-10-CM

## 2016-05-16 DIAGNOSIS — M069 Rheumatoid arthritis, unspecified: Secondary | ICD-10-CM | POA: Diagnosis present

## 2016-05-16 DIAGNOSIS — R0902 Hypoxemia: Secondary | ICD-10-CM | POA: Diagnosis not present

## 2016-05-16 DIAGNOSIS — Y92009 Unspecified place in unspecified non-institutional (private) residence as the place of occurrence of the external cause: Secondary | ICD-10-CM | POA: Diagnosis not present

## 2016-05-16 DIAGNOSIS — F17200 Nicotine dependence, unspecified, uncomplicated: Secondary | ICD-10-CM

## 2016-05-16 DIAGNOSIS — I729 Aneurysm of unspecified site: Secondary | ICD-10-CM

## 2016-05-16 DIAGNOSIS — Z79899 Other long term (current) drug therapy: Secondary | ICD-10-CM | POA: Diagnosis not present

## 2016-05-16 DIAGNOSIS — Z720 Tobacco use: Secondary | ICD-10-CM | POA: Diagnosis not present

## 2016-05-16 DIAGNOSIS — R55 Syncope and collapse: Secondary | ICD-10-CM | POA: Diagnosis present

## 2016-05-16 DIAGNOSIS — R682 Dry mouth, unspecified: Secondary | ICD-10-CM

## 2016-05-16 LAB — BLOOD GAS, ARTERIAL
ACID-BASE DEFICIT: 2.1 mmol/L — AB (ref 0.0–2.0)
BICARBONATE: 22.2 mmol/L (ref 20.0–28.0)
Drawn by: 295031
FIO2: 21
O2 SAT: 91 %
PATIENT TEMPERATURE: 98.6
PCO2 ART: 38.9 mmHg (ref 32.0–48.0)
PO2 ART: 65.4 mmHg — AB (ref 83.0–108.0)
pH, Arterial: 7.375 (ref 7.350–7.450)

## 2016-05-16 LAB — CBC WITH DIFFERENTIAL/PLATELET
BASOS ABS: 0 10*3/uL (ref 0.0–0.1)
BASOS PCT: 0 %
EOS ABS: 0 10*3/uL (ref 0.0–0.7)
Eosinophils Relative: 0 %
HCT: 42.2 % (ref 39.0–52.0)
Hemoglobin: 13.6 g/dL (ref 13.0–17.0)
LYMPHS PCT: 26 %
Lymphs Abs: 2 10*3/uL (ref 0.7–4.0)
MCH: 28.5 pg (ref 26.0–34.0)
MCHC: 32.2 g/dL (ref 30.0–36.0)
MCV: 88.5 fL (ref 78.0–100.0)
MONO ABS: 0.5 10*3/uL (ref 0.1–1.0)
Monocytes Relative: 6 %
NEUTROS PCT: 68 %
Neutro Abs: 5.1 10*3/uL (ref 1.7–7.7)
PLATELETS: 363 10*3/uL (ref 150–400)
RBC: 4.77 MIL/uL (ref 4.22–5.81)
RDW: 13.4 % (ref 11.5–15.5)
WBC: 7.6 10*3/uL (ref 4.0–10.5)

## 2016-05-16 LAB — COMPREHENSIVE METABOLIC PANEL
ALT: 35 U/L (ref 17–63)
ANION GAP: 8 (ref 5–15)
AST: 25 U/L (ref 15–41)
Albumin: 4.7 g/dL (ref 3.5–5.0)
Alkaline Phosphatase: 79 U/L (ref 38–126)
BUN: 24 mg/dL — AB (ref 6–20)
CHLORIDE: 105 mmol/L (ref 101–111)
CO2: 24 mmol/L (ref 22–32)
Calcium: 9.8 mg/dL (ref 8.9–10.3)
Creatinine, Ser: 1.23 mg/dL (ref 0.61–1.24)
Glucose, Bld: 167 mg/dL — ABNORMAL HIGH (ref 65–99)
POTASSIUM: 4.7 mmol/L (ref 3.5–5.1)
Sodium: 137 mmol/L (ref 135–145)
TOTAL PROTEIN: 8.2 g/dL — AB (ref 6.5–8.1)
Total Bilirubin: 0.5 mg/dL (ref 0.3–1.2)

## 2016-05-16 LAB — MAGNESIUM: MAGNESIUM: 1.8 mg/dL (ref 1.7–2.4)

## 2016-05-16 LAB — PHOSPHORUS: PHOSPHORUS: 3.5 mg/dL (ref 2.5–4.6)

## 2016-05-16 LAB — TROPONIN I

## 2016-05-16 LAB — BRAIN NATRIURETIC PEPTIDE: B NATRIURETIC PEPTIDE 5: 17.9 pg/mL (ref 0.0–100.0)

## 2016-05-16 MED ORDER — ENOXAPARIN SODIUM 40 MG/0.4ML ~~LOC~~ SOLN
40.0000 mg | SUBCUTANEOUS | Status: DC
  Filled 2016-05-16 (×3): qty 0.4

## 2016-05-16 MED ORDER — ONDANSETRON HCL 4 MG PO TABS: 4.0000 mg | ORAL_TABLET | Freq: Four times a day (QID) | ORAL | Status: DC | PRN

## 2016-05-16 MED ORDER — METHYLPREDNISOLONE ACETATE 80 MG/ML IJ SUSP
80.0000 mg | Freq: Once | INTRAMUSCULAR | Status: AC
  Administered 2016-05-16: 80 mg via INTRAMUSCULAR

## 2016-05-16 MED ORDER — TICAGRELOR 90 MG PO TABS
45.0000 mg | ORAL_TABLET | Freq: Every day | ORAL | Status: DC
  Administered 2016-05-16 – 2016-05-18 (×3): 45 mg via ORAL
  Filled 2016-05-16 (×3): qty 1

## 2016-05-16 MED ORDER — IPRATROPIUM BROMIDE 0.02 % IN SOLN: 0.5000 mg | RESPIRATORY_TRACT | Status: DC

## 2016-05-16 MED ORDER — LEVOFLOXACIN IN D5W 750 MG/150ML IV SOLN
750.0000 mg | INTRAVENOUS | Status: DC
  Administered 2016-05-17: 750 mg via INTRAVENOUS
  Filled 2016-05-16: qty 150

## 2016-05-16 MED ORDER — ASPIRIN EC 81 MG PO TBEC
81.0000 mg | DELAYED_RELEASE_TABLET | Freq: Every day | ORAL | Status: DC
  Administered 2016-05-17 – 2016-05-19 (×3): 81 mg via ORAL
  Filled 2016-05-16 (×3): qty 1

## 2016-05-16 MED ORDER — LEVALBUTEROL HCL 0.63 MG/3ML IN NEBU
0.6300 mg | INHALATION_SOLUTION | Freq: Once | RESPIRATORY_TRACT | Status: AC
  Administered 2016-05-16: 0.63 mg via RESPIRATORY_TRACT

## 2016-05-16 MED ORDER — IPRATROPIUM-ALBUTEROL 0.5-2.5 (3) MG/3ML IN SOLN
3.0000 mL | RESPIRATORY_TRACT | Status: DC
  Administered 2016-05-16 – 2016-05-18 (×11): 3 mL via RESPIRATORY_TRACT
  Filled 2016-05-16 (×11): qty 3

## 2016-05-16 MED ORDER — SENNOSIDES-DOCUSATE SODIUM 8.6-50 MG PO TABS: 1.0000 | ORAL_TABLET | Freq: Every evening | ORAL | Status: DC | PRN

## 2016-05-16 MED ORDER — VANCOMYCIN HCL IN DEXTROSE 1-5 GM/200ML-% IV SOLN
1000.0000 mg | Freq: Two times a day (BID) | INTRAVENOUS | Status: DC
  Administered 2016-05-17: 1000 mg via INTRAVENOUS
  Filled 2016-05-16: qty 200

## 2016-05-16 MED ORDER — ACETAMINOPHEN 650 MG RE SUPP: 650.0000 mg | Freq: Four times a day (QID) | RECTAL | Status: DC | PRN

## 2016-05-16 MED ORDER — METHYLPREDNISOLONE SODIUM SUCC 125 MG IJ SOLR
80.0000 mg | Freq: Three times a day (TID) | INTRAMUSCULAR | Status: DC
  Administered 2016-05-16 – 2016-05-18 (×6): 80 mg via INTRAVENOUS
  Filled 2016-05-16 (×6): qty 2

## 2016-05-16 MED ORDER — ATORVASTATIN CALCIUM 10 MG PO TABS
20.0000 mg | ORAL_TABLET | Freq: Every day | ORAL | Status: DC
  Administered 2016-05-17 – 2016-05-19 (×3): 20 mg via ORAL
  Filled 2016-05-16 (×3): qty 2

## 2016-05-16 MED ORDER — SODIUM CHLORIDE 0.9% FLUSH: 3.0000 mL | INTRAVENOUS | Status: DC | PRN

## 2016-05-16 MED ORDER — ONDANSETRON HCL 4 MG/2ML IJ SOLN: 4.0000 mg | Freq: Four times a day (QID) | INTRAMUSCULAR | Status: DC | PRN

## 2016-05-16 MED ORDER — IPRATROPIUM-ALBUTEROL 0.5-2.5 (3) MG/3ML IN SOLN: 3.0000 mL | Freq: Four times a day (QID) | RESPIRATORY_TRACT | Status: DC

## 2016-05-16 MED ORDER — PANTOPRAZOLE SODIUM 40 MG PO TBEC
40.0000 mg | DELAYED_RELEASE_TABLET | Freq: Every day | ORAL | Status: DC
  Administered 2016-05-16 – 2016-05-19 (×4): 40 mg via ORAL
  Filled 2016-05-16 (×4): qty 1

## 2016-05-16 MED ORDER — TICAGRELOR 90 MG PO TABS
90.0000 mg | ORAL_TABLET | Freq: Every day | ORAL | Status: DC
  Administered 2016-05-17 – 2016-05-19 (×3): 90 mg via ORAL
  Filled 2016-05-16 (×3): qty 1

## 2016-05-16 MED ORDER — ALBUTEROL SULFATE (2.5 MG/3ML) 0.083% IN NEBU: 2.5000 mg | INHALATION_SOLUTION | RESPIRATORY_TRACT | Status: DC | PRN

## 2016-05-16 MED ORDER — AMLODIPINE BESYLATE 10 MG PO TABS
10.0000 mg | ORAL_TABLET | Freq: Every day | ORAL | Status: DC
  Administered 2016-05-17 – 2016-05-19 (×3): 10 mg via ORAL
  Filled 2016-05-16 (×3): qty 1

## 2016-05-16 MED ORDER — LEVOFLOXACIN IN D5W 750 MG/150ML IV SOLN
750.0000 mg | Freq: Once | INTRAVENOUS | Status: AC
  Administered 2016-05-16: 750 mg via INTRAVENOUS
  Filled 2016-05-16: qty 150

## 2016-05-16 MED ORDER — ACETAMINOPHEN 325 MG PO TABS
650.0000 mg | ORAL_TABLET | Freq: Four times a day (QID) | ORAL | Status: DC | PRN
  Administered 2016-05-17 – 2016-05-18 (×2): 650 mg via ORAL
  Filled 2016-05-16 (×2): qty 2

## 2016-05-16 MED ORDER — SODIUM CHLORIDE 0.9% FLUSH
3.0000 mL | Freq: Two times a day (BID) | INTRAVENOUS | Status: DC
  Administered 2016-05-16 – 2016-05-17 (×2): 3 mL via INTRAVENOUS
  Administered 2016-05-18: 21:00:00 via INTRAVENOUS
  Administered 2016-05-19: 3 mL via INTRAVENOUS

## 2016-05-16 MED ORDER — ALBUTEROL SULFATE (2.5 MG/3ML) 0.083% IN NEBU: 2.5000 mg | INHALATION_SOLUTION | RESPIRATORY_TRACT | Status: DC

## 2016-05-16 MED ORDER — CLONIDINE HCL 0.1 MG PO TABS
0.1000 mg | ORAL_TABLET | Freq: Two times a day (BID) | ORAL | Status: DC
  Administered 2016-05-16 – 2016-05-19 (×6): 0.1 mg via ORAL
  Filled 2016-05-16 (×6): qty 1

## 2016-05-16 MED ORDER — SODIUM CHLORIDE 0.9 % IV SOLN
250.0000 mL | INTRAVENOUS | Status: DC | PRN
  Administered 2016-05-16: 250 mL via INTRAVENOUS

## 2016-05-16 MED ORDER — VANCOMYCIN HCL IN DEXTROSE 1-5 GM/200ML-% IV SOLN
1000.0000 mg | Freq: Once | INTRAVENOUS | Status: AC
  Administered 2016-05-16: 1000 mg via INTRAVENOUS
  Filled 2016-05-16: qty 200

## 2016-05-16 MED ORDER — OSELTAMIVIR PHOSPHATE 75 MG PO CAPS
75.0000 mg | ORAL_CAPSULE | Freq: Two times a day (BID) | ORAL | Status: DC
  Administered 2016-05-16 – 2016-05-17 (×3): 75 mg via ORAL
  Filled 2016-05-16 (×4): qty 1

## 2016-05-16 NOTE — Patient Instructions (Signed)
ICD-9-CM ICD-10-CM   1. COPD exacerbation (HCC) 491.21 J44.1     IM depo medrol 80mg  Xopnex neb in office Med surg addmit - [facility] 05/16/2016

## 2016-05-16 NOTE — H&P (Signed)
PULMONARY / CRITICAL CARE MEDICINE   Name: Derek Conner MRN: [id number] DOB: 30-Jul-1961    ADMISSION DATE:  05/16/2016  CHIEF COMPLAINT:  wprsening copd  HISTORY OF PRESENT ILLNESS:   55 year old male Native American. His wife works as a Engineer, civil (consulting) on 6 E. medical floor at [employer]. He has Gold stage 4 COPD based on July 2015 PFT shows FEV1 1.1 L/27%, ratio 40. with a DLCO of 25.3/73%. Last CT scan of the chest August 2016 with improving right upper lobe nodule and other scattered pulmonary nodules that are small and stable. In July 2017 had admission for community by pneumonia. Last office visit 03/26/2016 with Dr. Marchelle Gearing showed normal alpha-1 and stable COPD. He was also dealing with some TIA issues at that time associated with intracranial aneurysm. 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 It was associated with continued smoking. And ACE inhibitor intake. He was given a course of doxycycline and prednisone but he returns today 05/15/2016 with worsening shortness of breath. Hardly able to talk. Sentences and walk 50 feet without dyspnea. Sputum is now clear but he does not feel better. Cough is worse and chest tightness is worse. There is no chest pain. There is some orthopnea. There is no edema oror paroxysmal nocturnal dyspnea or hemoptysis or fever or chills. No focal deficits.  PAST MEDICAL HISTORY :  He  has a past medical history of Asthma; CAP (community acquired pneumonia) (01/19/2016); Chronic bronchitis (HCC); COPD (chronic obstructive pulmonary disease) (HCC); Emphysema lung (HCC); Emphysema of lung (HCC); Hepatitis A infection (~ 1980); High cholesterol; Hypertension; Pneumonia (1960s); Rheumatoid arthritis (HCC);  and Situational depression (2011).  PAST SURGICAL HISTORY: He  has a past surgical history that includes Foot fracture surgery (Left, ~ 2005); Cystectomy (Left, 1960's); Chest tube insertion (Left, 1990's); Video bronchoscopy (Bilateral, 12/06/2013); Appendectomy (1980's); Radiology with anesthesia (N/A, 02/29/2016); Radiology with anesthesia (N/A, 03/27/2016); ir generic historical (03/27/2016); ir generic historical (03/27/2016); ir generic historical (03/27/2016); ir generic historical (03/27/2016); ir generic historical (03/27/2016); ir generic historical (03/27/2016); ir generic historical (03/27/2016); ir generic historical (03/27/2016); ir generic historical (03/27/2016); ir generic historical (03/27/2016); ir generic historical (03/27/2016); ir generic historical (03/27/2016); and ir generic historical (04/12/2016).  Allergies  Allergen Reactions  . Symbicort [Budesonide-Formoterol Fumarate] Shortness Of Breath, Nausea And Vomiting and Other (See Comments)    heachache  . Dulera [Mometasone Furo-Formoterol Fum] Other (See Comments)    Headache     Current Facility-Administered Medications on File Prior to Encounter  Medication  . 0.9 %  sodium chloride infusion  . aspirin EC tablet 325 mg  . niMODipine (NIMOTOP) capsule 0-60 mg   Current Outpatient Prescriptions on File Prior to Encounter  Medication Sig  . albuterol (PROVENTIL HFA;VENTOLIN HFA) 108 (90 Base) MCG/ACT inhaler Inhale 2 puffs into the lungs every 6 (six) hours as needed for wheezing or shortness of breath.  Marland Kitchen amLODipine (NORVASC) 10 MG tablet TAKE 1 TABLET (10 MG) BY MOUTH DAILY.  Marland Kitchen aspirin EC 81 MG tablet Take 81 mg by mouth daily.  Marland Kitchen atorvastatin (LIPITOR) 20 MG tablet Take 1 tablet (20 mg total) by mouth daily.  . cloNIDine (CATAPRES) 0.1 MG tablet TAKE 1 TABLET (0.1 MG TOTAL) BY MOUTH 2 (TWO) TIMES DAILY.  Marland Kitchen doxycycline (VIBRA-TABS)  100 MG tablet Take 1 tablet (100 mg total) by mouth 2 (two) times daily.  . Fluticasone-Salmeterol  (ADVAIR DISKUS) 500-50 MCG/DOSE AEPB INHALE 1 PUFF BY MOUTH INTO THE LUNGS 2 TIMES DAILY.  Marland Kitchen lisinopril (PRINIVIL,ZESTRIL) 10 MG tablet TAKE 1 TABLET (10 MG) BY MOUTH DAILY.  . Multiple Vitamin (MULTIVITAMIN WITH MINERALS) TABS tablet Take 1 tablet by mouth daily. (Patient not taking: Reported on 05/16/2016)  . predniSONE (DELTASONE) 10 MG tablet 4 tabs for 2 days, then 3 tabs for 2 days, 2 tabs for 2 days, then 1 tab for 2 days, then stop  . ticagrelor (BRILINTA) 90 MG TABS tablet Take 90 mg by mouth 2 (two) times daily. 1 and 1/2  . tiotropium (SPIRIVA HANDIHALER) 18 MCG inhalation capsule PLACE 1 CAPSULE INTO INHALER AND INHALE INTO LUNGS DAILY.    FAMILY HISTORY:  His indicated that his mother is deceased. He indicated that his father is deceased. He indicated that the status of his neg hx is unknown.    SOCIAL HISTORY: He  reports that he has been smoking Cigarettes.  He has a 40.00 pack-year smoking history. He has never used smokeless tobacco. He reports that he drinks about 6.0 oz of alcohol per week . He reports that he does not use drugs.  REVIEW OF SYSTEMS:   11 point review of systems as per history of present illness otherwise negative  VITAL SIGNS: There were no vitals taken for this visit.  HEMODYNAMICS:    VENTILATOR SETTINGS:    INTAKE / OUTPUT: No intake/output data recorded.  PHYSICAL EXAMINATION: General:  Male sitting in the exam room in the office. Looks somewhat unwell Neuro:  Alert and oriented 3. Speech normal. Walks normally. HEENT:  No elevated JVP. No neck nodes Cardiovascular:  Regular rate and rhythm no murmurs Lungs:  Use of accessory muscle present. Gets dyspneic talking. Prolonged expiration present. Scattered faint wheeze present overall mild distress Abdomen:  Soft nontender Musculoskeletal:  No cyanosis no clubbing no edema Skin:  Intact in the exposed areas  LABS:  BMET No results for input(s): NA, K, CL, CO2, BUN, CREATININE, GLUCOSE  in the last 168 hours.  Electrolytes No results for input(s): CALCIUM, MG, PHOS in the last 168 hours.  CBC No results for input(s): WBC, HGB, HCT, PLT in the last 168 hours.  Coag's No results for input(s): APTT, INR in the last 168 hours.  Sepsis Markers No results for input(s): LATICACIDVEN, PROCALCITON, O2SATVEN in the last 168 hours.  ABG No results for input(s): PHART, PCO2ART, PO2ART in the last 168 hours.  Liver Enzymes No results for input(s): AST, ALT, ALKPHOS, BILITOT, ALBUMIN in the last 168 hours.  Cardiac Enzymes No results for input(s): TROPONINI, PROBNP in the last 168 hours.  Glucose No results for input(s): GLUCAP in the last 168 hours.  Imaging No results found.   DISCUSSION: Acute exacerbation COPD -failure of outpatient treatment  ASSESSMENT / PLAN:  PULMONARY A: #Baseline -Gold stage IV COPD not on oxygen therapy. Alpha 1 mm - Continue smoking - hx pulm nodules aug 2016 #Acute current - Failed outpatient therapy with acute exacerbation COPD - Clinically and alternate diagnosis  P:   IM Depo-Medrol in the office nebulizer in the office Scheduled bronchodilators with nebulizer IV steroids Change doxycycline to Levaquin and vanc (hx of 2 admits < 90 days) Rule out MI Get CXR -> time 1 year CT based on this Check flu and viral pcr - tamiflu tillthen   CARDIOVASCULAR A:  At risk MI P:  Serial trop  RENAL A:   Nil acute clnically P:   Check bmet  GASTROINTESTINAL A:   Nil acute - but at risk for gastritis P:   ppi  HEMATOLOGIC A:   Nil acute P:  dvt proph  INFECTIOUS A:   Rule out flu P:   Empiric tamiflu  ENDOCRINE A:   Nil acut4   P:   monitor  NEUROLOGIC A:   REcent aneurysm coiling P:   monitor   FAMILY  - Updates: ppatinet updated    Dr. Kalman Shan, M.D., [facility]C.P Pulmonary and Critical Care Medicine Staff Physician [facility] [place] [facility] Pager: [phone], If no answer or between  15:00h - 7:00h: call [phone]  05/16/2016 1:53 PM

## 2016-05-16 NOTE — Progress Notes (Signed)
Pt arrived unit as a direct admit, alert and oriented, MD notified of Pt's location. Will continue with current plan of care.

## 2016-05-16 NOTE — Progress Notes (Addendum)
Pharmacy Antibiotic Note  Derek Conner is a 55 y.o. male admitted on 05/16/2016 with COPD exacerbation.  This is his 3rd admission for PNA/COPD exac in past 3 months.  He failed outpatient treatment with Doxycycline.  He is afebrile.  Labs have not been drawn yet this admission, but patient previously had good renal function Pharmacy has been consulted for Vancomycin & Levaquin dosing.  Plan: Vancomycin 1000mg  IV q12. Goal Trough 15-20 mcg/mL Levaquin 750mg  IV q24     Temp (24hrs), Avg:98.3 F (36.8 C), Min:98.3 F (36.8 C), Max:98.3 F (36.8 C)  No results for input(s): WBC, CREATININE, LATICACIDVEN, VANCOTROUGH, VANCOPEAK, VANCORANDOM, GENTTROUGH, GENTPEAK, GENTRANDOM, TOBRATROUGH, TOBRAPEAK, TOBRARND, AMIKACINPEAK, AMIKACINTROU, AMIKACIN in the last 168 hours.  CrCl cannot be calculated (Patient's most recent lab result is older than the maximum 21 days allowed.).    Allergies  Allergen Reactions  . Symbicort [Budesonide-Formoterol Fumarate] Shortness Of Breath, Nausea And Vomiting and Other (See Comments)    heachache  . Dulera [Mometasone Furo-Formoterol Fum] Other (See Comments)    Headache     Antimicrobials this admission: Levaquin 10/12 >>  Vancomycin 10/12 >>   Dose adjustments this admission:  Microbiology results: 10/12 Influenza PCR: 10/12 Resp PCR:   Thank you for allowing pharmacy to be a part of this patient's care.   12/12, PharmD, BCPS Pager [phone] 05/16/2016 5:00 PM

## 2016-05-16 NOTE — Telephone Encounter (Signed)
FYI to Dr. Marchelle Gearing - pt is in room 1510 5 [place]

## 2016-05-16 NOTE — Progress Notes (Signed)
Subjective:     Patient ID: Derek Conner, male   DOB: 11-30-1960, 55 y.o.   MRN: [id number]  HPI   55 yo male , Native American , smoker followed for severe COPD    05/13/2016 Acute OV  Pt presents for an acute office visit. Complains of one week of increased cough with thick yellow mucus, wheezing and shortness of breath. Patient denies any hemoptysis, fever, orthopnea, PND, or increased leg swelling. Patient is on ACE inhibitor. Says that he was doing well up until the last week without any flare of his cough. He remains on Advair and Spiriva. Complains of nasal congestion and drainage over the last 3-4 days. He does not get flu shots. He does continue to smoke. We discussed smoking cessation. 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  He says he has done well from this.    OV 05/16/2016  Chief Complaint  Patient presents with  . Acute Visit    Pt c/o prod cough white mucus, chest tightness/congestion, SOB, wheezing. Denies any sinus drainage/congestion, fever, nausea or vomiting.     Hospital admit   has a past medical history of Asthma; CAP (community acquired pneumonia) (01/19/2016); Chronic bronchitis (HCC); COPD (chronic obstructive pulmonary disease) (HCC); Emphysema lung (HCC); Emphysema of lung (HCC); Hepatitis A infection (~ 1980); High cholesterol; Hypertension; Pneumonia (1960s); Rheumatoid arthritis (HCC); and Situational depression (2011).   reports that he has been smoking Cigarettes.  He has a 40.00 pack-year smoking history. He has never used smokeless tobacco.  Past Surgical History:  Procedure Laterality Date  . APPENDECTOMY  1980's  . CHEST TUBE INSERTION Left 1990's   "lung collapsed"  . CYSTECTOMY Left 1960's   "wrist"  . FOOT FRACTURE SURGERY Left ~ 2005   "it was crushed"  . IR GENERIC HISTORICAL  03/27/2016   IR ANGIOGRAM FOLLOW UP STUDY 03/27/2016 Julieanne Cotton, MD [facility]  . IR  GENERIC HISTORICAL  03/27/2016   IR ANGIOGRAM FOLLOW UP STUDY 03/27/2016 Julieanne Cotton, MD [facility]  . IR GENERIC HISTORICAL  03/27/2016   IR ANGIO INTRA EXTRACRAN SEL COM CAROTID INNOMINATE UNI L MOD SED 03/27/2016 Julieanne Cotton, MD [facility]  . IR GENERIC HISTORICAL  03/27/2016   IR ANGIOGRAM FOLLOW UP STUDY 03/27/2016 Julieanne Cotton, MD [facility]  . IR GENERIC HISTORICAL  03/27/2016   IR ANGIO VERTEBRAL SEL SUBCLAVIAN INNOMINATE UNI R MOD SED 03/27/2016 Julieanne Cotton, MD [facility]  . IR GENERIC HISTORICAL  03/27/2016   IR 3D INDEPENDENT WKST 03/27/2016 Julieanne Cotton, MD [facility]  . IR GENERIC HISTORICAL  03/27/2016   IR ANGIOGRAM FOLLOW UP STUDY 03/27/2016 Julieanne Cotton, MD [facility]  . IR GENERIC HISTORICAL  03/27/2016   IR NEURO EACH ADD'L AFTER BASIC UNI RIGHT (MS) 03/27/2016 Julieanne Cotton, MD [facility]  . IR GENERIC HISTORICAL  03/27/2016   IR ANGIOGRAM FOLLOW UP STUDY 03/27/2016 Julieanne Cotton, MD [facility]  . IR GENERIC HISTORICAL  03/27/2016   IR TRANSCATH/EMBOLIZ 03/27/2016 Julieanne Cotton, MD [facility]  . IR GENERIC HISTORICAL  03/27/2016   IR ANGIO INTRA EXTRACRAN SEL INTERNAL CAROTID UNI R MOD SED 03/27/2016 Julieanne Cotton, MD [facility]  . IR GENERIC HISTORICAL  03/27/2016   IR ANGIOGRAM FOLLOW UP STUDY 03/27/2016 Julieanne Cotton, MD [facility]  . IR GENERIC HISTORICAL  04/12/2016   IR RADIOLOGIST EVAL & MGMT 04/12/2016 [facility]  . RADIOLOGY WITH ANESTHESIA N/A 02/29/2016   Procedure: EMBOLIZATION     (  RADIOLOGY WITH ANESTHESIA);  Surgeon: Julieanne Cotton, MD;  Location: [facility] OR;  Service: Radiology;  Laterality: N/A;  . RADIOLOGY WITH ANESTHESIA N/A 03/27/2016   Procedure: Embolization;  Surgeon: Julieanne Cotton, MD;  Location: [facility] OR;  Service: Radiology;  Laterality: N/A;  . VIDEO BRONCHOSCOPY Bilateral 12/06/2013   Procedure: VIDEO BRONCHOSCOPY WITHOUT FLUORO;  Surgeon: Alyson Reedy, MD;  Location: [facility]  ENDOSCOPY;  Service: Cardiopulmonary;  Laterality: Bilateral;    Allergies  Allergen Reactions  . Symbicort [Budesonide-Formoterol Fumarate] Shortness Of Breath, Nausea And Vomiting and Other (See Comments)    heachache  . Dulera [Mometasone Furo-Formoterol Fum] Other (See Comments)    Headache     Immunization History  Administered Date(s) Administered  . Tdap 08/29/2015    Family History  Problem Relation Age of Onset  . Cancer Mother   . Arthritis Mother   . Stroke Father   . Hypertension Father   . Colon cancer Neg Hx        Current Outpatient Prescriptions:  .  albuterol (PROVENTIL HFA;VENTOLIN HFA) 108 (90 Base) MCG/ACT inhaler, Inhale 2 puffs into the lungs every 6 (six) hours as needed for wheezing or shortness of breath., Disp: 3 Inhaler, Rfl: 3 .  amLODipine (NORVASC) 10 MG tablet, TAKE 1 TABLET (10 MG) BY MOUTH DAILY., Disp: 90 tablet, Rfl: 3 .  aspirin EC 81 MG tablet, Take 81 mg by mouth daily., Disp: , Rfl:  .  atorvastatin (LIPITOR) 20 MG tablet, Take 1 tablet (20 mg total) by mouth daily., Disp: 30 tablet, Rfl: 2 .  cloNIDine (CATAPRES) 0.1 MG tablet, TAKE 1 TABLET (0.1 MG TOTAL) BY MOUTH 2 (TWO) TIMES DAILY., Disp: 180 tablet, Rfl: 3 .  doxycycline (VIBRA-TABS) 100 MG tablet, Take 1 tablet (100 mg total) by mouth 2 (two) times daily., Disp: 14 tablet, Rfl: 0 .  Fluticasone-Salmeterol (ADVAIR DISKUS) 500-50 MCG/DOSE AEPB, INHALE 1 PUFF BY MOUTH INTO THE LUNGS 2 TIMES DAILY., Disp: 3 each, Rfl: 3 .  lisinopril (PRINIVIL,ZESTRIL) 10 MG tablet, TAKE 1 TABLET (10 MG) BY MOUTH DAILY., Disp: 90 tablet, Rfl: 3 .  predniSONE (DELTASONE) 10 MG tablet, 4 tabs for 2 days, then 3 tabs for 2 days, 2 tabs for 2 days, then 1 tab for 2 days, then stop, Disp: 20 tablet, Rfl: 0 .  ticagrelor (BRILINTA) 90 MG TABS tablet, Take 90 mg by mouth 2 (two) times daily. 1 and 1/2, Disp: , Rfl:  .  tiotropium (SPIRIVA HANDIHALER) 18 MCG inhalation capsule, PLACE 1 CAPSULE INTO INHALER AND  INHALE INTO LUNGS DAILY., Disp: 90 capsule, Rfl: 3 .  Multiple Vitamin (MULTIVITAMIN WITH MINERALS) TABS tablet, Take 1 tablet by mouth daily. (Patient not taking: Reported on 05/16/2016), Disp: 30 tablet, Rfl: 0 No current facility-administered medications for this visit.   Facility-Administered Medications Ordered in Other Visits:  .  0.9 %  sodium chloride infusion, , Intravenous, Continuous, Ralene Muskrat, PA-C .  aspirin EC tablet 325 mg, 325 mg, Oral, 60 min Pre-Op, Ralene Muskrat, PA-C .  niMODipine (NIMOTOP) capsule 0-60 mg, 0-60 mg, Oral, 60 min Pre-Op, Ralene Muskrat, PA-C    Review of Systems     Objective:   Physical Exam Vitals:   05/16/16 1233  BP: 138/80  Pulse: 79  SpO2: 96%  Weight: 188 lb (85.3 kg)  Height: 6' (1.829 m)    Estimated body mass index is 25.5 kg/m as calculated from the following:   Height as of this encounter: 6' (1.829 m).   Weight  as of this encounter: 188 lb (85.3 kg).      Assessment:       ICD-9-CM ICD-10-CM   1. COPD exacerbation (HCC) 491.21 J44.1 methylPREDNISolone acetate (DEPO-MEDROL) injection 80 mg     levalbuterol (XOPENEX) nebulizer solution 0.63 mg       Plan:     admit

## 2016-05-17 ENCOUNTER — Encounter (HOSPITAL_COMMUNITY): Payer: Self-pay | Admitting: *Deleted

## 2016-05-17 DIAGNOSIS — R05 Cough: Secondary | ICD-10-CM

## 2016-05-17 DIAGNOSIS — M059 Rheumatoid arthritis with rheumatoid factor, unspecified: Secondary | ICD-10-CM

## 2016-05-17 DIAGNOSIS — Z720 Tobacco use: Secondary | ICD-10-CM

## 2016-05-17 DIAGNOSIS — R0902 Hypoxemia: Secondary | ICD-10-CM

## 2016-05-17 LAB — BASIC METABOLIC PANEL
Anion gap: 7 (ref 5–15)
BUN: 29 mg/dL — AB (ref 6–20)
CO2: 22 mmol/L (ref 22–32)
CREATININE: 1.33 mg/dL — AB (ref 0.61–1.24)
Calcium: 9.6 mg/dL (ref 8.9–10.3)
Chloride: 107 mmol/L (ref 101–111)
GFR calc Af Amer: >60 mL/min (ref >60)
GFR, EST NON AFRICAN AMERICAN: 59 mL/min — AB (ref >60)
GLUCOSE: 144 mg/dL — AB (ref 65–99)
POTASSIUM: 4.4 mmol/L (ref 3.5–5.1)
SODIUM: 136 mmol/L (ref 135–145)

## 2016-05-17 LAB — CBC
HEMATOCRIT: 39.5 % (ref 39.0–52.0)
Hemoglobin: 12.9 g/dL — ABNORMAL LOW (ref 13.0–17.0)
MCH: 28.8 pg (ref 26.0–34.0)
MCHC: 32.7 g/dL (ref 30.0–36.0)
MCV: 88.2 fL (ref 78.0–100.0)
PLATELETS: 327 10*3/uL (ref 150–400)
RBC: 4.48 MIL/uL (ref 4.22–5.81)
RDW: 13.4 % (ref 11.5–15.5)
WBC: 6.9 10*3/uL (ref 4.0–10.5)

## 2016-05-17 LAB — EXPECTORATED SPUTUM ASSESSMENT W GRAM STAIN, RFLX TO RESP C

## 2016-05-17 LAB — TROPONIN I: Troponin I: <0.03 ng/mL (ref <0.03)

## 2016-05-17 LAB — RESPIRATORY PANEL BY PCR
Adenovirus: NOT DETECTED
BORDETELLA PERTUSSIS-RVPCR: NOT DETECTED
CHLAMYDOPHILA PNEUMONIAE-RVPPCR: NOT DETECTED
CORONAVIRUS HKU1-RVPPCR: NOT DETECTED
Coronavirus 229E: NOT DETECTED
Coronavirus NL63: NOT DETECTED
Coronavirus OC43: NOT DETECTED
INFLUENZA A-RVPPCR: NOT DETECTED
Influenza B: NOT DETECTED
METAPNEUMOVIRUS-RVPPCR: NOT DETECTED
Mycoplasma pneumoniae: NOT DETECTED
PARAINFLUENZA VIRUS 2-RVPPCR: NOT DETECTED
PARAINFLUENZA VIRUS 3-RVPPCR: NOT DETECTED
PARAINFLUENZA VIRUS 4-RVPPCR: NOT DETECTED
Parainfluenza Virus 1: NOT DETECTED
RESPIRATORY SYNCYTIAL VIRUS-RVPPCR: NOT DETECTED
RHINOVIRUS / ENTEROVIRUS - RVPPCR: NOT DETECTED

## 2016-05-17 LAB — INFLUENZA PANEL BY PCR (TYPE A & B)
H1N1FLUPCR: NOT DETECTED
INFLBPCR: NEGATIVE
Influenza A By PCR: NEGATIVE

## 2016-05-17 LAB — EXPECTORATED SPUTUM ASSESSMENT W REFEX TO RESP CULTURE

## 2016-05-17 NOTE — Progress Notes (Signed)
PULMONARY / CRITICAL CARE MEDICINE   Name: Derek Conner MRN: [id number] DOB: Nov 27, 1960    ADMISSION DATE:  05/17/2016  CHIEF COMPLAINT:  Worsening copd  HISTORY OF PRESENT ILLNESS:   55 yo male smoker with productive cough, dyspnea, chest tightness from AECOPD.  Failed outpt Abx and prednisone.  He is followed by Dr. Marchelle Gearing for GOLD 4 COPD/emphysema (PFT July 2015 >> FEV1 1.1 (27%), FEV1% 40, DLCO 73%), pulmonary nodules.  He has hx of Rheumatoid arthritis and was previously on humira >> followed by rheumatology in [place], and had Rt MCA aneurysm s/p coiling August 2017.  His wife works as Charity fundraiser for [employer] at [employer].  SUBJECTIVE:  Pt reports not feeling any better - remains SOB, difficult to walk to the bathroom.  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  He indicates he can not walk to the bathroom now due to SOB.  Denies fevers, chills overnight.  Minimal sputum production.  Wife also at bedside with multiple questions - should we give fluids for sr cr?, does he need vanco?, do we need O2 for prior ABG etc  VITAL SIGNS: BP 131/79 (BP Location: Left Arm)   Pulse 92   Temp 98.1 F (36.7 C) (Oral)   Resp 20   Ht 6' (1.829 m)   Wt 188 lb (85.3 kg)   SpO2 98%   BMI 25.50 kg/m   INTAKE / OUTPUT: I/O last 3 completed shifts: In: 342.3 [I.V.:142.3; IV Piggyback:200] Out: -   PHYSICAL EXAMINATION: General:  Adult male in NAD at rest Neuro:  Alert and oriented 3. Speech normal.  HEENT:  No elevated JVP. No LAN Cardiovascular:  Regular rate and rhythm no murmurs Lungs:  Prolonged resp phase, lungs bilaterally with poor air movement / diminished, wheezing  Abdomen:  Soft nontender Musculoskeletal:  No cyanosis no clubbing no edema Skin:  Intact in the exposed areas  LABS:  BMET  Recent Labs Lab 05/16/16 1523 05/17/16 0147  NA 137 136  K 4.7 4.4  CL 105 107  CO2 24 22  BUN 24* 29*  CREATININE 1.23 1.33*  GLUCOSE  167* 144*    Electrolytes  Recent Labs Lab 05/16/16 1523 05/17/16 0147  CALCIUM 9.8 9.6  MG 1.8  --   PHOS 3.5  --     CBC  Recent Labs Lab 05/16/16 1523 05/17/16 0147  WBC 7.6 6.9  HGB 13.6 12.9*  HCT 42.2 39.5  PLT 363 327    ABG  Recent Labs Lab 05/16/16 1612  PHART 7.375  PCO2ART 38.9  PO2ART 65.4*    Liver Enzymes  Recent Labs Lab 05/16/16 1523  AST 25  ALT 35  ALKPHOS 79  BILITOT 0.5  ALBUMIN 4.7    Cardiac Enzymes  Recent Labs Lab 05/16/16 1950 05/17/16 0147 05/17/16 0745  TROPONINI <0.03 <0.03 <0.03     Imaging X-ray Chest Pa And Lateral  Result Date: 05/16/2016 CLINICAL DATA:  Shortness of breath, current smoker, history of COPD -asthma. Previous episodes of community-acquired pneumonia. EXAM: CHEST  2 VIEW COMPARISON:  Chest x-ray of February 08, 2016 and chest CT scan of March 15, 2016. FINDINGS: The lungs remain hyperinflated with hemidiaphragm flattening. There is no focal infiltrate. There is no pleural effusion. There is a stable approximately 6 mm diameter nodule peripherally in the right lower lung consistent with a nipple shadow. This is stable. A subtle similar density is noted on the left. The heart  and pulmonary vascularity are normal. The mediastinum is normal in width. There is calcification in the wall of the aortic arch. The bony thorax exhibits no acute abnormality. IMPRESSION: COPD.  No pneumonia, CHF, nor other acute cardiopulmonary disease. Aortic atherosclerosis. Electronically Signed   By: David  [place] M.D.   On: 05/16/2016 15:53   CULTURES:  RVP 10/12 >>  Influenza 10/12 >>  Sputum 10/13 >>   ABX:  Vanco 10/12 >> 10/13  Levaquin 10/12 >>    DISCUSSION: 55 y/o M with PMH of GOLD IV COPD (alpha-1 negative), not on O2 admitted to [facility] on 10/12 with AECOPD and outpatient treatment failure.   ASSESSMENT / PLAN:   Acute Exacerbation of COPD - failed outpatient therapy  GOLD IV COPD - alpha 1  negative  Plan: Duoneb Q4 with acute exacerbation  Solu-medrol 80 mg IV Q8 O2 to support saturations 88-95% Pulmonary Hygiene  CXR images personally reviewed with no evidence of PNA.  D/C vancomycin  Continue levaquin, consider 5 days total abx.  D2/x Follow up with Dr. Marchelle Gearing at d/c Will need referral to pulmonary rehab Assess ambulatory O2 needs prior to d/c  Tobacco Abuse - ongoing smoking   Plan: Smoking cessation counseling completed 10/13  Rule Out Influenza / Viral Illness   Plan: Assess viral / flu panel Empiric Tamiflu   Hx Pulmonary Nodules - found 03/2015, ? If nodules are related to RA with hx of arthritis in hands  Plan:   Follow up CT per Dr. Marchelle Gearing as outpatient  Assess RF in am   Steroid Induced Hyperglycemia   Plan: Monitor glucose on BMP  Recent aneurysm s/p coiling  Plan:   Monitor clinically / supportive care  Cough Related Vasovagal Episode - noted 10/13, witnessed by NP, quick resolution / no seizure  Plan: Monitor    Nutrition: heart healthy diet DVT:  Heparin SQ GI: PPI, at risk gastritis with steroids  UPDATES: - Updates: Patient updated at bedside.    Canary Brim, NP-C [facility] Pgr: [phone] or if no answer [phone] 05/17/2016, 10:24 AM   He still has cough and dyspnea with exertion.  Alert.  Normal strength.  No stridor.  Faint b/l wheeze.  Abd soft.  No edema.  CXR - no infiltrated  WBC 6.9, Na 136, Creatinine 1.33  Assessment/plan:  AECOPD. - steroids, BDs - narrow abx  Cough syncope. - explained mechanism of this to pt and his wife  Hypoxia. - assess for home oxygen prior to d/c  Rheumatoid arthritis. - f/u with rheumatology as outpt - ?if lung nodules might be related to RA >> will need further assessment as outpt with Dr. Marchelle Gearing  Tobacco abuse. - discussed options to assist with smoking cessation  Updated pt's wife at bedside.  Coralyn Helling, MD [facility]  Pulmonary/Critical Care 05/17/2016, 11:58 AM Pager:  [phone] After 3pm call: [phone]

## 2016-05-17 NOTE — Telephone Encounter (Signed)
MR will follow up with pt and is checking his chart periodically.

## 2016-05-17 NOTE — Progress Notes (Signed)
Pt has had a total of 3 episodes of vasavagal blackout during coughing episode. Pt states that he completely blacks out and his arms begin to shake during the episode. Pt pulse drops down into the 40's and increases in the 110's following the episode. Pt stable at present time.  Caterin Tabares W Olive Motyka, RN

## 2016-05-17 NOTE — Telephone Encounter (Signed)
Please let patient know that I am in office rotation all week and Dr Craige Cotta will be rounding but I am following along in chart  Thanks  Dr. Kalman Shan, M.D., [facility].C.P Pulmonary and Critical Care Medicine Staff Physician [facility] [place] [facility] Pager: [phone], If no answer or between  15:00h - 7:00h: call [phone]  05/17/2016 8:29 AM

## 2016-05-18 LAB — CBC
HEMATOCRIT: 37.7 % — AB (ref 39.0–52.0)
HEMOGLOBIN: 12.4 g/dL — AB (ref 13.0–17.0)
MCH: 28.1 pg (ref 26.0–34.0)
MCHC: 32.9 g/dL (ref 30.0–36.0)
MCV: 85.5 fL (ref 78.0–100.0)
PLATELETS: 322 10*3/uL (ref 150–400)
RBC: 4.41 MIL/uL (ref 4.22–5.81)
RDW: 13.3 % (ref 11.5–15.5)
WBC: 14.4 10*3/uL — AB (ref 4.0–10.5)

## 2016-05-18 LAB — BASIC METABOLIC PANEL
ANION GAP: 10 (ref 5–15)
BUN: 30 mg/dL — ABNORMAL HIGH (ref 6–20)
CHLORIDE: 106 mmol/L (ref 101–111)
CO2: 21 mmol/L — AB (ref 22–32)
Calcium: 9.6 mg/dL (ref 8.9–10.3)
Creatinine, Ser: 1.5 mg/dL — ABNORMAL HIGH (ref 0.61–1.24)
GFR calc non Af Amer: 51 mL/min — ABNORMAL LOW (ref >60)
GFR, EST AFRICAN AMERICAN: 59 mL/min — AB (ref >60)
Glucose, Bld: 139 mg/dL — ABNORMAL HIGH (ref 65–99)
POTASSIUM: 3.8 mmol/L (ref 3.5–5.1)
SODIUM: 137 mmol/L (ref 135–145)

## 2016-05-18 LAB — RHEUMATOID FACTOR: Rhuematoid fact SerPl-aCnc: 109.8 IU/mL — ABNORMAL HIGH (ref 0.0–13.9)

## 2016-05-18 MED ORDER — FLUTICASONE FUROATE-VILANTEROL 100-25 MCG/INH IN AEPB
1.0000 | INHALATION_SPRAY | Freq: Every day | RESPIRATORY_TRACT | Status: DC
  Administered 2016-05-19: 1 via RESPIRATORY_TRACT
  Filled 2016-05-18: qty 28

## 2016-05-18 MED ORDER — IPRATROPIUM-ALBUTEROL 0.5-2.5 (3) MG/3ML IN SOLN: 3.0000 mL | RESPIRATORY_TRACT | Status: DC | PRN

## 2016-05-18 MED ORDER — METHYLPREDNISOLONE SODIUM SUCC 40 MG IJ SOLR
40.0000 mg | Freq: Two times a day (BID) | INTRAMUSCULAR | Status: DC
  Administered 2016-05-18 – 2016-05-19 (×2): 40 mg via INTRAVENOUS
  Filled 2016-05-18 (×2): qty 1

## 2016-05-18 MED ORDER — MOMETASONE FURO-FORMOTEROL FUM 200-5 MCG/ACT IN AERO: 2.0000 | INHALATION_SPRAY | Freq: Two times a day (BID) | RESPIRATORY_TRACT | Status: DC

## 2016-05-18 MED ORDER — TIOTROPIUM BROMIDE MONOHYDRATE 18 MCG IN CAPS
18.0000 ug | ORAL_CAPSULE | Freq: Every day | RESPIRATORY_TRACT | Status: DC
  Administered 2016-05-18 – 2016-05-19 (×2): 18 ug via RESPIRATORY_TRACT
  Filled 2016-05-18: qty 5

## 2016-05-18 MED ORDER — LEVOFLOXACIN 500 MG PO TABS
500.0000 mg | ORAL_TABLET | Freq: Every day | ORAL | Status: DC
  Administered 2016-05-18 – 2016-05-19 (×2): 500 mg via ORAL
  Filled 2016-05-18 (×2): qty 1

## 2016-05-18 NOTE — Progress Notes (Signed)
PULMONARY / CRITICAL CARE MEDICINE   Name: Derek Conner MRN: [id number] DOB: 01/21/1961    ADMISSION DATE:  05/18/2016  CHIEF COMPLAINT:  Worsening copd  HISTORY OF PRESENT ILLNESS:   55 yo male active smoker with productive cough, dyspnea, chest tightness from AECOPD.  Failed outpt Abx and prednisone.  He is followed by Dr. Marchelle Gearing for GOLD 4 COPD/emphysema (PFT July 2015 >> FEV1 1.1 (27%), FEV1% 40, DLCO 73%), pulmonary nodules.  He has hx of Rheumatoid arthritis and was previously on humira >> followed by rheumatology in [place], and had Rt MCA aneurysm s/p coiling August 2017.  His wife works as Charity fundraiser for [employer] at [facility] [employer] [facility] [employer] [facility].  SUBJECTIVE:   Feels better and wants to go home tomorrow but hasn't started walking the floors yet on RA  VITAL SIGNS: BP (!) 141/78 (BP Location: Left Arm)   Pulse 91   Temp 98 F (36.7 C) (Oral)   Resp 19   Ht 6' (1.829 m)   Wt 189 lb 8 oz (86 kg)   SpO2 99%   BMI 25.70 kg/m   INTAKE / OUTPUT: I/O last 3 completed shifts: In: 1088.7 [P.O.:480; I.V.:258.7; IV Piggyback:350] Out: -   PHYSICAL EXAMINATION: General:  Adult male in NAD at rest Neuro:  Alert and oriented 3. Speech normal.  HEENT:  No elevated JVP. No LAN Cardiovascular:  Regular rate and rhythm no murmurs Lungs:  Prolonged resp phase- hyperinflated to percussion/ distant late exp wheeze  Abdomen:  Soft nontender Musculoskeletal:  No cyanosis no clubbing no edema Skin:  Intact in the exposed areas  LABS:  BMET  Recent Labs Lab 05/16/16 1523 05/17/16 0147 05/18/16 0608  NA 137 136 137  K 4.7 4.4 3.8  CL 105 107 106  CO2 24 22 21*  BUN 24* 29* 30*  CREATININE 1.23 1.33* 1.50*  GLUCOSE 167* 144* 139*    Electrolytes  Recent Labs Lab 05/16/16 1523 05/17/16 0147 05/18/16 0608  CALCIUM 9.8 9.6 9.6  MG 1.8  --   --   PHOS 3.5  --   --     CBC  Recent Labs Lab 05/16/16 1523 05/17/16 0147 05/18/16 0608  WBC 7.6 6.9 14.4*  HGB 13.6 12.9* 12.4*  HCT 42.2 39.5 37.7*   PLT 363 327 322    ABG  Recent Labs Lab 05/16/16 1612  PHART 7.375  PCO2ART 38.9  PO2ART 65.4*    Liver Enzymes  Recent Labs Lab 05/16/16 1523  AST 25  ALT 35  ALKPHOS 79  BILITOT 0.5  ALBUMIN 4.7    Cardiac Enzymes  Recent Labs Lab 05/17/16 0147 05/17/16 0745 05/17/16 1356  TROPONINI <0.03 <0.03 <0.03     Imaging No results found.    CULTURES:  RVP 10/12 >>  Neg  Influenza 10/12 >>  Sputum 10/13 >  Moderate mixed orgs>>  ABX:  Vanco 10/12 >> 10/13  Levaquin 10/12 >> changed to po 10/14 >>   DISCUSSION: 55 y/o M with PMH of GOLD IV COPD (alpha-1 negative), not on O2 admitted to [facility] on 10/12 with AECOPD and outpatient treatment failure.   ASSESSMENT / PLAN:   Acute Exacerbation of COPD - failed outpatient therapy  GOLD IV COPD - alpha 1 negative  Plan: Duoneb Q4 with acute exacerbation  Solu-medrol reduced to 40 mg IV q 12 10/14  O2 titrated off 10/14  Pulmonary Hygiene  Continue levaquin, changed to 500 po 10/14  rx x 10/17 Follow up with Dr. Marchelle Gearing at d/c Will need  referral to pulmonary rehab    Tobacco  use - ongoing smoking discouraged      Rule Out Influenza / Viral Illness   Plan: Assess viral / flu panel neg  Empiric Tamiflu > d/c 10/14   Hx Pulmonary Nodules - found 03/2015, ? If nodules are related to RA with hx of arthritis in hands  Plan:   Follow up CT per Dr. Marchelle Gearing as outpatient  Assess RF in am   Steroid Induced Hyperglycemia   Plan: Monitor glucose on BMP  Recent aneurysm s/p coiling  Plan:   Monitor clinically / supportive care  Cough Related Vasovagal Episode - noted 10/13, witnessed by NP, quick resolution / no seizure  Plan: Monitor    Nutrition: heart healthy diet DVT:  Heparin SQ GI: PPI, at risk gastritis with steroids  UPDATES: - Updates: Patient updated at bedside.     - The proper method of use, as well as anticipated side effects, of a metered-dose inhaler are discussed and  demonstrated to the patient. Improved effectiveness after extensive coaching during this visit to a level of approximately 90%  % from a baseline of 75%  So no need for neb for saba and would strongly consider hfa Frutoso Schatz for maint laba/lama/ics    Sandrea Hughs, MD Pulmonary and Critical Care Medicine [facility] Cell [phone] After 5:30 PM or weekends, call [phone]

## 2016-05-18 NOTE — Progress Notes (Signed)
Pt has had another vasovagal episode approximately 1100 this morning. Pt coughed after Advair and blacked out for a few seconds and when coming to hands began to shake and upper chest. This report given by pt's wife and pt. Per pt and spouse, this vasovagal response has happened during pt's last hospital visit last June (pt had PNA) and was on 5W at [facility]. This vasovagal episode happened on that visit several times. Pt had MRI. Found aneurysm that was coiled August 2017. Will continue to monitor closely.

## 2016-05-19 LAB — CULTURE, RESPIRATORY W GRAM STAIN

## 2016-05-19 LAB — CULTURE, RESPIRATORY: CULTURE: NORMAL

## 2016-05-19 MED ORDER — FAMOTIDINE 20 MG PO TABS: 20.0000 mg | ORAL_TABLET | Freq: Every day | ORAL | 2 refills | Status: DC

## 2016-05-19 MED ORDER — LEVOFLOXACIN 500 MG PO TABS: 500.0000 mg | ORAL_TABLET | Freq: Every day | ORAL | 0 refills | Status: DC

## 2016-05-19 MED ORDER — FAMOTIDINE 20 MG PO TABS: 20.0000 mg | ORAL_TABLET | Freq: Every day | ORAL | Status: DC

## 2016-05-19 MED ORDER — CLONIDINE HCL 0.1 MG PO TABS: 0.2000 mg | ORAL_TABLET | Freq: Two times a day (BID) | ORAL | Status: DC

## 2016-05-19 MED ORDER — FLUTICASONE FUROATE-VILANTEROL 100-25 MCG/INH IN AEPB
1.0000 | INHALATION_SPRAY | Freq: Every day | RESPIRATORY_TRACT | 3 refills | Status: DC
Start: 2016-05-20 — End: 2016-05-21

## 2016-05-19 MED ORDER — PREDNISONE 20 MG PO TABS: 40.0000 mg | ORAL_TABLET | Freq: Every day | ORAL | Status: DC

## 2016-05-19 MED ORDER — PANTOPRAZOLE SODIUM 40 MG PO TBEC: 40.0000 mg | DELAYED_RELEASE_TABLET | Freq: Every day | ORAL | 3 refills | Status: DC

## 2016-05-19 MED ORDER — CLONIDINE HCL 0.2 MG PO TABS: 0.2000 mg | ORAL_TABLET | Freq: Two times a day (BID) | ORAL | 11 refills | Status: DC

## 2016-05-19 MED ORDER — PREDNISONE 10 MG PO TABS: ORAL_TABLET | ORAL | 0 refills | Status: DC

## 2016-05-19 MED ORDER — ALBUTEROL SULFATE (2.5 MG/3ML) 0.083% IN NEBU: 3.0000 mL | INHALATION_SOLUTION | RESPIRATORY_TRACT | Status: DC | PRN

## 2016-05-19 MED ORDER — PANTOPRAZOLE SODIUM 40 MG PO TBEC: 40.0000 mg | DELAYED_RELEASE_TABLET | Freq: Every day | ORAL | Status: DC

## 2016-05-19 NOTE — Progress Notes (Signed)
Pt leaving this afternoon with his spouse. Alert, oriented, and without c/o. Discharge instructions/prescriptions given/explained with pt and spouse verbalizing understanding. Pt aware to pickup prescriptions at [facility]. Followup appointments noted.

## 2016-05-19 NOTE — Progress Notes (Signed)
Paged PCCM about patient's Bp of 151/93 no return call. Info passed to dayshift RN.

## 2016-05-19 NOTE — Discharge Summary (Signed)
Physician Discharge Summary  Patient ID: Derek Conner MRN: [id number] DOB/AGE: 09-Oct-1960 55 y.o.  Admit date: 05/16/2016 Discharge date: 05/19/2016  Problem List Principal Problem:   COPD exacerbation [facility])  HPI: 55 year old male Native American. His wife works as a Engineer, civil (consulting) on 6 E. medical floor at [employer]. He has Gold stage 4 COPD based on July 2015 PFT shows FEV1 1.1 L/27%, ratio 40. with a DLCO of 25.3/73%. Last CT scan of the chest August 2016 with improving right upper lobe nodule and other scattered pulmonary nodules that are small and stable. In July 2017 had admission for community by pneumonia. Last office visit 03/26/2016 with Dr. Marchelle Gearing showed normal alpha-1 and stable COPD. He was also dealing with some TIA issues at that time associated with intracranial aneurysm. 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 It was associated with continued smoking. And ACE inhibitor intake. He was given a course of doxycycline and prednisone but he returns today 05/15/2016 with worsening shortness of breath. Hardly able to talk. Sentences and walk 50 feet without dyspnea. Sputum is now clear but he does not feel better. Cough is worse and chest tightness is worse. There is no chest pain. There is some orthopnea. There is no edema oror paroxysmal nocturnal dyspnea or hemoptysis or fever or chills. No focal deficits.  Hospital Course:   CULTURES:  RVP 10/12 >>  Neg  Influenza 10/12 >>  Sputum 10/13 >  Moderate mixed orgs>> pending at d/c  ABX:  Vanco 10/12 >> 10/13  Levaquin 10/12 >> changed to po 10/14 >>plan to d/c p 10/17 dose    DISCUSSION: 55 y/o M with PMH of GOLD IV COPD (alpha-1 negative), not on O2  admitted to [facility] on 10/12 with AECOPD and outpatient treatment failure actively still smoking   ASSESSMENT / PLAN:   Acute Exacerbation of COPD - failed outpatient therapy  GOLD IV COPD - alpha 1 negative  Plan: breo 100 one click each am (new rx) / spiriva one capsule each am  O2 titrated off 10/14   Continue levaquin, changed to 500 po 10/14  rx x 10/17 Prednisone 10 mg take  4 each am x 2 days,   2 each am x 2 days,  1 each am x 2 days and stop       Tobacco  use - ongoing smoking discouraged      Rule Out Influenza / Viral Illness   Plan: Assess viral / flu panel neg  Empiric Tamiflu > d/c 10/14   Hx Pulmonary Nodules - found 03/2015, ? If nodules are related to RA with hx of arthritis in hands  Plan:   Follow up CT per Dr. Marchelle Gearing as outpatient  Assess RF in am   Steroid Induced Hyperglycemia  Fasting sugar 139 am . 10/15  Plan: Taper off pred  Recent aneurysm s/p coiling  Plan:   Monitor clinically / supportive care  Cough Related Vasovagal Episode - noted 10/13, witnessed by NP, quick resolution / no seizure Classic cough syncope, advised use flutter valve for cough/ mucinex dm 1200 mg every 12 hours and lie down immediately to prevent     HBP/ CRI / anxiety on clonidine 0.1 mg bid > increased to 0.2 bid on 10/15 and  stay on this rx   Has reached MHB and ambulating hallways s 02 so ready for d/c pm 10/15 Discussed plan of care also with wife at bedside F/u in one week of d/c with Tammy NP    Labs at discharge Lab Results  Component Value Date   CREATININE 1.50 (H) 05/18/2016   BUN 30 (H) 05/18/2016   NA 137 05/18/2016   K 3.8 05/18/2016   CL 106 05/18/2016   CO2 21 (L) 05/18/2016   Lab Results  Component Value Date   WBC 14.4 (H) 05/18/2016   HGB 12.4 (L) 05/18/2016   HCT 37.7 (L) 05/18/2016   MCV 85.5 05/18/2016   PLT 322 05/18/2016   Lab Results  Component Value Date   ALT 35 05/16/2016   AST 25 05/16/2016    ALKPHOS 79 05/16/2016   BILITOT 0.5 05/16/2016   Lab Results  Component Value Date   INR 0.99 03/26/2016   INR 0.97 02/19/2016   INR 0.92 12/03/2013    Current radiology studies No results found.  Disposition:  01-Home or Self Care  Discharge Instructions    Discharge patient    Complete by:  As directed        Medication List    STOP taking these medications   doxycycline 100 MG tablet Commonly known as:  VIBRA-TABS   Fluticasone-Salmeterol 500-50 MCG/DOSE Aepb Commonly known as:  ADVAIR DISKUS   lisinopril 10 MG tablet Commonly known as:  PRINIVIL,ZESTRIL     TAKE these medications   albuterol 108 (90 Base) MCG/ACT inhaler Commonly known as:  PROVENTIL HFA;VENTOLIN HFA Inhale 2 puffs into the lungs every 6 (six) hours as needed for wheezing or shortness of breath.   amLODipine 10 MG tablet Commonly known as:  NORVASC TAKE 1 TABLET (10 MG) BY MOUTH DAILY.   aspirin EC 81 MG tablet Take 81 mg by mouth daily.   atorvastatin 20 MG tablet Commonly known as:  LIPITOR Take 1 tablet (20 mg total) by mouth daily.   cloNIDine 0.2 MG tablet Commonly known as:  CATAPRES Take 1 tablet (0.2 mg total) by mouth 2 (two) times daily. What changed:  medication strength  how much to take  how to take this  when to take this  additional instructions   famotidine 20 MG tablet Commonly known as:  PEPCID Take 1 tablet (20 mg total) by mouth at bedtime.   fluticasone furoate-vilanterol 100-25 MCG/INH Aepb Commonly known as:  BREO ELLIPTA Inhale 1 puff into the lungs daily. Start taking on:  05/20/2016   levofloxacin 500 MG tablet Commonly known as:  LEVAQUIN Take 1 tablet (500 mg total) by mouth daily. Start taking on:  05/20/2016   multivitamin with minerals Tabs tablet Take 1 tablet by mouth daily.   pantoprazole 40 MG tablet Commonly known as:  PROTONIX Take 1 tablet (40 mg total) by mouth daily at 12 noon. Start taking on:  05/20/2016   predniSONE  10 MG tablet Commonly known as:  DELTASONE 40 mg x 2 days, 30 mg x 2 days, 20 mg x 2 days , 10 mg x 2 days then stop What changed:  additional instructions   ticagrelor 90 MG Tabs tablet Commonly known as:  BRILINTA Take 45-90 mg by mouth 2 (two) times daily. 1 in the morning and 1/2 in the evening   tiotropium 18 MCG inhalation capsule Commonly known as:  SPIRIVA HANDIHALER PLACE 1 CAPSULE INTO INHALER AND INHALE INTO LUNGS DAILY.  Follow-up Information    Rubye Oaks, NP. Call in 1 day(s).   Specialty:  Pulmonary Disease Why:  Call 10/16 and make an appoinment to Derek Conner with in 7 days. Contact information: [address] [place] [address] [phone]            Discharged Condition: fair  Time spent on discharge greater than 40 minutes.  Vital signs at Discharge. Temp:  [98.7 F (37.1 C)-98.8 F (37.1 C)] 98.7 F (37.1 C) (10/15 0500) Pulse Rate:  [79-95] 79 (10/15 0500) Resp:  [18-20] 18 (10/15 0500) BP: (141-151)/(90-97) 151/93 (10/15 0500) SpO2:  [95 %-98 %] 98 % (10/15 0843) Weight:  [189 lb 13.1 oz (86.1 kg)] 189 lb 13.1 oz (86.1 kg) (10/15 0600) Office follow up Special Information or instructions. He is to call and make an appointment with Derek Conner within 7 days. Signed: Brett Canales Minor ACNP [facility] P[facility] Pager [phone] till 3 pm If no answer page [phone] 05/19/2016, 2:07 PM   See note same day - agree with discharge plans as outlined   Sandrea Hughs, MD Pulmonary and Critical Care Medicine [facility] Cell [phone] After 5:30 PM or weekends, call [phone]

## 2016-05-19 NOTE — Progress Notes (Signed)
PULMONARY / CRITICAL CARE MEDICINE   Name: Derek Conner MRN: [id number] DOB: 1961/02/29    ADMISSION DATE:  05/19/2016  CHIEF COMPLAINT:  Worsening copd  HISTORY OF PRESENT ILLNESS:   55 yo male active smoker with productive cough, dyspnea, chest tightness from AECOPD.  Failed outpt Abx and prednisone.  He is followed by Dr. Marchelle Gearing for GOLD 4 COPD/emphysema (PFT July 2015 >> FEV1 1.1 (27%), FEV1% 40, DLCO 73%), pulmonary nodules.  He has hx of Rheumatoid arthritis and was previously on humira >> followed by rheumatology in [place], and had Rt MCA aneurysm s/p coiling August 2017.  His wife works as Charity fundraiser for [employer] at [employer].  SUBJECTIVE:   Sob walking floors slow pace to nursing station and back but no desats   VITAL SIGNS: BP (!) 151/93 (BP Location: Left Arm) Comment: RN Notify  Pulse 79   Temp 98.7 F (37.1 C) (Oral)   Resp 18   Ht 6' (1.829 m)   Wt 189 lb 13.1 oz (86.1 kg)   SpO2 98%   BMI 25.74 kg/m   INTAKE / OUTPUT: I/O last 3 completed shifts: In: 603 [P.O.:600; I.V.:3] Out: -   PHYSICAL EXAMINATION: General:  Adult male in NAD at rest Neuro:  Alert and oriented 3. Speech normal.  HEENT:  No elevated JVP. No LAN Cardiovascular:  Regular rate and rhythm no murmurs Lungs:  Prolonged resp phase- hyperinflated to percussion/ distant late exp wheezes bilaterally  Abdomen:  Soft nontender Musculoskeletal:  No cyanosis no clubbing no edema Skin:  Intact in the exposed areas  LABS:  BMET  Recent Labs Lab 05/16/16 1523 05/17/16 0147 05/18/16 0608  NA 137 136 137  K 4.7 4.4 3.8  CL 105 107 106  CO2 24 22 21*  BUN 24* 29* 30*  CREATININE 1.23 1.33* 1.50*  GLUCOSE 167* 144* 139*    Electrolytes  Recent Labs Lab 05/16/16 1523 05/17/16 0147 05/18/16 0608  CALCIUM 9.8 9.6 9.6  MG 1.8  --   --   PHOS 3.5  --   --     CBC  Recent Labs Lab 05/16/16 1523 05/17/16 0147 05/18/16 0608  WBC 7.6 6.9 14.4*  HGB 13.6 12.9* 12.4*  HCT 42.2 39.5 37.7*  PLT  363 327 322    ABG  Recent Labs Lab 05/16/16 1612  PHART 7.375  PCO2ART 38.9  PO2ART 65.4*    Liver Enzymes  Recent Labs Lab 05/16/16 1523  AST 25  ALT 35  ALKPHOS 79  BILITOT 0.5  ALBUMIN 4.7    Cardiac Enzymes  Recent Labs Lab 05/17/16 0147 05/17/16 0745 05/17/16 1356  TROPONINI <0.03 <0.03 <0.03     Imaging No results found.    CULTURES:  RVP 10/12 >>  Neg  Influenza 10/12 >>  Sputum 10/13 >  Moderate mixed orgs>> pending at d/c  ABX:  Vanco 10/12 >> 10/13  Levaquin 10/12 >> changed to po 10/14 >>plan to d/c p 10/17 dose    DISCUSSION: 55 y/o M with PMH of GOLD IV COPD (alpha-1 negative), not on O2 admitted to [facility] on 10/12 with AECOPD and outpatient treatment failure actively still smoking   ASSESSMENT / PLAN:   Acute Exacerbation of COPD - failed outpatient therapy  GOLD IV COPD - alpha 1 negative  Plan: breo 100 one click each am (new rx) / spiriva one capsule each am  O2 titrated off 10/14   Continue levaquin, changed to 500 po 10/14  rx x 10/17 Prednisone 10  mg take  4 each am x 2 days,   2 each am x 2 days,  1 each am x 2 days and stop       Tobacco  use - ongoing smoking discouraged      Rule Out Influenza / Viral Illness   Plan: Assess viral / flu panel neg  Empiric Tamiflu > d/c 10/14   Hx Pulmonary Nodules - found 03/2015, ? If nodules are related to RA with hx of arthritis in hands  Plan:   Follow up CT per Dr. Marchelle Gearing as outpatient  Assess RF in am   Steroid Induced Hyperglycemia  Fasting sugar 139 am . 10/15  Plan: Taper off pred  Recent aneurysm s/p coiling  Plan:   Monitor clinically / supportive care  Cough Related Vasovagal Episode - noted 10/13, witnessed by NP, quick resolution / no seizure Classic cough syncope, advised use flutter valve for cough/ mucinex dm 1200 mg every 12 hours and lie down immediately to prevent     HBP/ CRI / anxiety on clonidine 0.1 mg bid > increased to 0.2 bid on 10/15 and  stay on this rx   Has reached MHB and ambulating hallways s 02 so ready for d/c pm 10/15 Discussed plan of care also with wife at bedside F/u in one week of d/c with Tammy NP     Sandrea Hughs, MD Pulmonary and Critical Care Medicine [facility] Cell [phone] After 5:30 PM or weekends, call [phone]

## 2016-05-20 ENCOUNTER — Ambulatory Visit: Payer: 59 | Admitting: Adult Health

## 2016-05-20 ENCOUNTER — Telehealth: Payer: Self-pay | Admitting: *Deleted

## 2016-05-20 MED FILL — FAMOTIDINE 20 MG TABLET: 20 | 90 days supply | Qty: 90 | Fill #0

## 2016-05-20 MED FILL — levoFLOXacin 500 MG TABS: 500 | 2 days supply | Qty: 2 | Fill #0

## 2016-05-20 MED FILL — cloNIDine HCL 0.2 MG TABS: 0.2 | 90 days supply | Qty: 180 | Fill #0

## 2016-05-20 MED FILL — PANTOPRAZOLE SOD DR 40 MG T: 40 | 90 days supply | Qty: 90 | Fill #0

## 2016-05-20 MED FILL — predniSONE 10 MG TABS: 10 | 8 days supply | Qty: 20 | Fill #0

## 2016-05-20 NOTE — Telephone Encounter (Signed)
Transition Care Management Follow-up Telephone Call  Patient ID: Derek Conner MRN: [id number] DOB/AGE: May 11, 1961 55 y.o.  Admit date: 05/16/2016 Discharge date: 05/19/2016  Problem List Principal Problem:   COPD exacerbation (HCC)   How have you been since you were released from the hospital? "Slightly better than when I went in."   Do you understand why you were in the hospital? yes   Do you understand the discharge instructions? yes   Where were you discharged to? Home   Items Reviewed:  Medications reviewed: yes  Allergies reviewed: yes  Dietary changes reviewed: yes, heart healthy  Referrals reviewed: yes, pt states rheumatology referral was recommended   Functional Questionnaire:   Activities of Daily Living (ADLs):   He states they are independent in the following: ambulation, bathing and hygiene, feeding, continence, grooming, toileting and dressing States they require assistance with the following: None   Any transportation issues/concerns?: no   Any patient concerns? no   Confirmed importance and date/time of follow-up visits scheduled no, pt stated his wife will call to schedule appt  Confirmed with patient if condition begins to worsen call PCP or go to the ER.  Patient was given the office number and encouraged to call back with question or concerns.  : yes

## 2016-05-21 ENCOUNTER — Encounter: Payer: Self-pay | Admitting: Pulmonary Disease

## 2016-05-21 ENCOUNTER — Ambulatory Visit (INDEPENDENT_AMBULATORY_CARE_PROVIDER_SITE_OTHER): Payer: 59 | Admitting: Pulmonary Disease

## 2016-05-21 VITALS — BP 138/74 | HR 93 | Ht 72.0 in | Wt 192.2 lb

## 2016-05-21 DIAGNOSIS — J441 Chronic obstructive pulmonary disease with (acute) exacerbation: Secondary | ICD-10-CM | POA: Diagnosis not present

## 2016-05-21 DIAGNOSIS — J439 Emphysema, unspecified: Secondary | ICD-10-CM

## 2016-05-21 MED ORDER — FLUTICASONE-SALMETEROL 500-50 MCG/DOSE IN AEPB: 1.0000 | INHALATION_SPRAY | Freq: Two times a day (BID) | RESPIRATORY_TRACT | 2 refills | Status: DC

## 2016-05-21 MED ORDER — ROFLUMILAST 500 MCG PO TABS: 500.0000 ug | ORAL_TABLET | Freq: Every day | ORAL | 3 refills | Status: DC

## 2016-05-21 MED ORDER — ALPRAZOLAM 0.5 MG PO TABS: 0.5000 mg | ORAL_TABLET | Freq: Two times a day (BID) | ORAL | 2 refills | Status: DC | PRN

## 2016-05-21 MED ORDER — FLUTICASONE-SALMETEROL 500-50 MCG/DOSE IN AEPB: 1.0000 | INHALATION_SPRAY | Freq: Two times a day (BID) | RESPIRATORY_TRACT | 0 refills | Status: DC

## 2016-05-21 MED ORDER — ROFLUMILAST 500 MCG PO TABS: 500.0000 ug | ORAL_TABLET | Freq: Every day | ORAL | 0 refills | Status: DC

## 2016-05-21 MED FILL — DALIRESP 500 MCG TABLET: 500 | 30 days supply | Qty: 30 | Fill #0

## 2016-05-21 MED FILL — ALPRAZolam 0.5 MG TABS: 0.5 | 15 days supply | Qty: 30 | Fill #0

## 2016-05-21 NOTE — Progress Notes (Signed)
Derek Conner    [id number]    1961/06/04  Primary Care Physician:Saguier, Kateri Mc  Referring Physician: Esperanza Richters, PA-C [address] [place], [place] [address]  Chief complaint:  Follow up after hospitalization.  HPI: 55 yo male active smoker with productive cough, dyspnea, chest tightness from AECOPD.  Failed outpt Abx and prednisone.  He is followed by Dr. Marchelle Gearing for GOLD 4 COPD/emphysema (PFT July 2015 >> FEV1 1.1 (27%), FEV1% 40, DLCO 73%), pulmonary nodules.  He has hx of Rheumatoid arthritis and was previously on humira >> followed by rheumatology in [place], and had Rt MCA aneurysm s/p coiling August 2017.  His wife works as Charity fundraiser for [employer] at [employer]  He was admitted last week to [facility] for acute exacerbation of COPD. He was treated with antibiotics and prednisone.He had a chest x-ray that did not show any pneumonia. His flu test was negative. He continues to smoke. His last cigarette was on day of admission. He is finishing his last dose of Levaquin today and has a few more days of prednisone left. He continues on Spiriva. His Advair was changed to [facility] in the hospital and he feels this is not working as well as Advair. He is marginally better but continues to have significant issues with dyspnea on exertion. He states that he cannot walk from his bathroom to his bed without stopping to catch his breath. He has issues with anxiety and hyperventilation is making his breathing worse. He denies any cough, sputum production, fevers, chills.  Outpatient Encounter Prescriptions as of 05/21/2016  Medication Sig  . albuterol (PROVENTIL HFA;VENTOLIN HFA) 108 (90 Base) MCG/ACT inhaler Inhale 2 puffs into the lungs every 6 (six) hours as needed for wheezing or shortness of breath.  Marland Kitchen amLODipine (NORVASC) 10 MG tablet TAKE 1 TABLET (10 MG) BY MOUTH DAILY.  Marland Kitchen aspirin EC 81 MG tablet Take 81 mg by mouth daily.  Marland Kitchen atorvastatin (LIPITOR) 20 MG tablet Take 1  tablet (20 mg total) by mouth daily.  . cloNIDine (CATAPRES) 0.2 MG tablet Take 1 tablet (0.2 mg total) by mouth 2 (two) times daily.  . famotidine (PEPCID) 20 MG tablet Take 1 tablet (20 mg total) by mouth at bedtime.  . fluticasone furoate-vilanterol (BREO ELLIPTA) 100-25 MCG/INH AEPB Inhale 1 puff into the lungs daily.  . Multiple Vitamin (MULTIVITAMIN WITH MINERALS) TABS tablet Take 1 tablet by mouth daily.  . pantoprazole (PROTONIX) 40 MG tablet Take 1 tablet (40 mg total) by mouth daily at 12 noon.  . predniSONE (DELTASONE) 10 MG tablet 40 mg x 2 days, 30 mg x 2 days, 20 mg x 2 days , 10 mg x 2 days then stop  . ticagrelor (BRILINTA) 90 MG TABS tablet Take 45-90 mg by mouth 2 (two) times daily. 1 in the morning and 1/2 in the evening  . tiotropium (SPIRIVA HANDIHALER) 18 MCG inhalation capsule PLACE 1 CAPSULE INTO INHALER AND INHALE INTO LUNGS DAILY.  . [DISCONTINUED] levofloxacin (LEVAQUIN) 500 MG tablet Take 1 tablet (500 mg total) by mouth daily. (Patient not taking: Reported on 05/21/2016)   Facility-Administered Encounter Medications as of 05/21/2016  Medication  . 0.9 %  sodium chloride infusion  . aspirin EC tablet 325 mg  . niMODipine (NIMOTOP) capsule 0-60 mg    Allergies as of 05/21/2016 - Review Complete 05/21/2016  Allergen Reaction Noted  . Symbicort [budesonide-formoterol fumarate] Shortness Of Breath, Nausea And Vomiting, and Other (  See Comments) 08/31/2014  . Dulera [mometasone furo-formoterol fum] Other (See Comments)     Past Medical History:  Diagnosis Date  . Asthma   . CAP (community acquired pneumonia) 01/19/2016  . Chronic bronchitis (HCC)   . COPD (chronic obstructive pulmonary disease) (HCC)   . Emphysema lung (HCC)   . Emphysema of lung (HCC)   . Hepatitis A infection ~ 1980  . High cholesterol   . Hypertension   . Pneumonia 1960s  . Rheumatoid arthritis (HCC)    "hands mostly"  ( 01/19/2016)  . Situational depression 12/17/09   "only when my mother  died"    Past Surgical History:  Procedure Laterality Date  . APPENDECTOMY  1980's  . CHEST TUBE INSERTION Left 1990's   "lung collapsed"  . CYSTECTOMY Left 1960's   "wrist"  . FOOT FRACTURE SURGERY Left ~ 2003/12/18   "it was crushed"  . IR GENERIC HISTORICAL  03/27/2016   IR ANGIOGRAM FOLLOW UP STUDY 03/27/2016 Julieanne Cotton, MD [facility]  . IR GENERIC HISTORICAL  03/27/2016   IR ANGIOGRAM FOLLOW UP STUDY 03/27/2016 Julieanne Cotton, MD [facility]  . IR GENERIC HISTORICAL  03/27/2016   IR ANGIO INTRA EXTRACRAN SEL COM CAROTID INNOMINATE UNI L MOD SED 03/27/2016 Julieanne Cotton, MD [facility]  . IR GENERIC HISTORICAL  03/27/2016   IR ANGIOGRAM FOLLOW UP STUDY 03/27/2016 Julieanne Cotton, MD [facility]  . IR GENERIC HISTORICAL  03/27/2016   IR ANGIO VERTEBRAL SEL SUBCLAVIAN INNOMINATE UNI R MOD SED 03/27/2016 Julieanne Cotton, MD [facility]  . IR GENERIC HISTORICAL  03/27/2016   IR 3D INDEPENDENT WKST 03/27/2016 Julieanne Cotton, MD [facility]  . IR GENERIC HISTORICAL  03/27/2016   IR ANGIOGRAM FOLLOW UP STUDY 03/27/2016 Julieanne Cotton, MD [facility]  . IR GENERIC HISTORICAL  03/27/2016   IR NEURO EACH ADD'L AFTER BASIC UNI RIGHT (MS) 03/27/2016 Julieanne Cotton, MD [facility]  . IR GENERIC HISTORICAL  03/27/2016   IR ANGIOGRAM FOLLOW UP STUDY 03/27/2016 Julieanne Cotton, MD [facility]  . IR GENERIC HISTORICAL  03/27/2016   IR TRANSCATH/EMBOLIZ 03/27/2016 Julieanne Cotton, MD [facility]  . IR GENERIC HISTORICAL  03/27/2016   IR ANGIO INTRA EXTRACRAN SEL INTERNAL CAROTID UNI R MOD SED 03/27/2016 Julieanne Cotton, MD [facility]  . IR GENERIC HISTORICAL  03/27/2016   IR ANGIOGRAM FOLLOW UP STUDY 03/27/2016 Julieanne Cotton, MD [facility]  . IR GENERIC HISTORICAL  04/12/2016   IR RADIOLOGIST EVAL & MGMT 04/12/2016 [facility]  . RADIOLOGY WITH ANESTHESIA N/A 02/29/2016   Procedure: EMBOLIZATION     (RADIOLOGY WITH ANESTHESIA);  Surgeon: Julieanne Cotton, MD;   Location[facility] OR;  Service: Radiology;  Laterality: N/A;  . RADIOLOGY WITH ANESTHESIA N/A 03/27/2016   Procedure: Embolization;  Surgeon: Julieanne Cotton, MD;  Location: [facility] OR;  Service: Radiology;  Laterality: N/A;  . VIDEO BRONCHOSCOPY Bilateral 12/06/2013   Procedure: VIDEO BRONCHOSCOPY WITHOUT FLUORO;  Surgeon: Alyson Reedy, MD;  Location: [facility] ENDOSCOPY;  Service: Cardiopulmonary;  Laterality: Bilateral;    Family History  Problem Relation Age of Onset  . Cancer Mother   . Arthritis Mother   . Stroke Father   . Hypertension Father   . Colon cancer Neg Hx     Social History   Social History  . Marital status: Married    Spouse name: N/A  . Number of children: N/A  . Years of education: N/A   Occupational History  . Not on file.   Social  History Main Topics  . Smoking status: Current Every Day Smoker    Packs/day: 1.00    Years: 40.00    Types: Cigarettes    Last attempt to quit: 01/19/2016  . Smokeless tobacco: Never Used     Comment: QUIT 01/19/16- back up to smoking 1 ppd  . Alcohol use 6.0 oz/week    10 Cans of beer per week     Comment: 12/04/2013 "2beers/day"  . Drug use: No     Comment: 01/19/2016 "nothing in the last 3 months"  . Sexual activity: Yes   Other Topics Concern  . Not on file   Social History Narrative  . No narrative on file     Review of systems: Review of Systems  Constitutional: Negative for fever and chills.  HENT: Negative.   Eyes: Negative for blurred vision.  Respiratory: as per HPI  Cardiovascular: Negative for chest pain and palpitations.  Gastrointestinal: Negative for vomiting, diarrhea, blood per rectum. Genitourinary: Negative for dysuria, urgency, frequency and hematuria.  Musculoskeletal: Negative for myalgias, back pain and joint pain.  Skin: Negative for itching and rash.  Neurological: Negative for dizziness, tremors, focal weakness, seizures and loss of consciousness.  Endo/Heme/Allergies: Negative for environmental  allergies.  Psychiatric/Behavioral: Negative for depression, suicidal ideas and hallucinations.  All other systems reviewed and are negative.   Physical Exam: Blood pressure 138/74, pulse 93, height 6' (1.829 m), weight 192 lb 3.2 oz (87.2 kg), SpO2 97 %. Gen:      No acute distress HEENT:  EOMI, sclera anicteric Neck:     No masses; no thyromegaly Lungs:    Clear to auscultation bilaterally; normal respiratory effort CV:         Regular rate and rhythm; no murmurs Abd:      + bowel sounds; soft, non-tender; no palpable masses, no distension Ext:    No edema; adequate peripheral perfusion Skin:      Warm and dry; no rash Neuro: alert and oriented x 3 Psych: normal mood and affect  Data Reviewed: Spirometry 03/31/14 FVC 3.31 (65%) FEV1 1.03 [26%] F/F 31 Very severe obstruction  AIAT 03/26/16- 140, PIMM  CXR 05/16/16- Hyperinflation. No infiltrate. Images reviewed  ABG - 7.37/39/65/91%  Flu test 05/17/16- Negative  Assessment:  Stage IV COPD, negative alpha-1 antitrypsin. Follow-up after hospitalization for COPD exacerbation  He is doing marginaly better but continues to have severe dyspnea on exertion. He is not wheezing on examination today and I don't believe he requires another course of antibiotics or steroids. We will recheck O2 levels on ambulation. I'll change the Breo back to Advair as he seems to do better on it. We will continue the Spiriva. I'll start him on daliresp and refer for pulmonary rehabilitation. Anxiety and hyperventilation seems to be a major factor in symptoms. I'll start him on Xanax for anxiety.   Encouraged to keep off cigarettes.  Plan/Recommendations: - Change breo to advair, continue spiriva - Start daliresp, refer to pulmonary rehab - Xanax 0.5 mg PRN for anxiety  Follow up with Dr. Eber Hong MD [facility] Pager [phone] 05/21/2016, 10:28 AM  CC: Esperanza Richters, PA-C

## 2016-05-21 NOTE — Patient Instructions (Addendum)
We will change your Breo back to Advair. Continue using the Spiriva Check your oxygen levels on ambulation today. Start daliresp Xanax 0.5 mg bid PRN for anxiety Referral to pulmonary rehab  Follow up with Dr. Marchelle Gearing

## 2016-05-21 NOTE — Addendum Note (Signed)
Addended by: Boone Master E on: 05/21/2016 05:02 PM   Modules accepted: Orders

## 2016-05-27 ENCOUNTER — Telehealth (HOSPITAL_COMMUNITY): Payer: Self-pay | Admitting: *Deleted

## 2016-06-10 MED FILL — BRILINTA 90 MG TABLET: 90 | 30 days supply | Qty: 60 | Fill #2

## 2016-06-10 MED FILL — ALPRAZolam 0.5 MG TABS: 0.5 | 15 days supply | Qty: 30 | Fill #1

## 2016-06-10 MED FILL — NICOTINE 14 MG/24HR PATCH: 14 | 28 days supply | Qty: 28 | Fill #1

## 2016-06-26 ENCOUNTER — Ambulatory Visit (INDEPENDENT_AMBULATORY_CARE_PROVIDER_SITE_OTHER): Payer: 59 | Admitting: Family Medicine

## 2016-06-26 VITALS — BP 130/78 | HR 88 | Temp 98.4°F | Ht 72.0 in | Wt 181.4 lb

## 2016-06-26 DIAGNOSIS — J181 Lobar pneumonia, unspecified organism: Secondary | ICD-10-CM

## 2016-06-26 DIAGNOSIS — J189 Pneumonia, unspecified organism: Secondary | ICD-10-CM

## 2016-06-26 MED ORDER — AZITHROMYCIN 250 MG PO TABS: ORAL_TABLET | ORAL | 0 refills | Status: DC

## 2016-06-26 MED FILL — AZITHROMYCIN 250 MG TABLET: 250 | 5 days supply | Qty: 6 | Fill #0

## 2016-06-26 NOTE — Patient Instructions (Signed)
Stop smoking

## 2016-06-26 NOTE — Progress Notes (Signed)
Chief Complaint  Patient presents with  . Chest Congestion    x1 week. Clear mucus coming up.    Derek Conner here for URI complaints.  Duration: 1 week  Associated symptoms: productive cough, more labored breathing Denies: sinus congestion, rhinorrhea, itchy watery eyes, ear pain, ear drainage and sore throat Treatment to date: Albuterol helps a little bit Sick contacts: No  ROS:  Const: Denies fevers HEENT: As noted in HPI Lungs: +SOB  Past Medical History:  Diagnosis Date  . Asthma   . CAP (community acquired pneumonia) 01/19/2016  . Chronic bronchitis (HCC)   . COPD (chronic obstructive pulmonary disease) (HCC)   . Emphysema lung (HCC)   . Emphysema of lung (HCC)   . Hepatitis A infection ~ 1980  . High cholesterol   . Hypertension   . Pneumonia 1960s  . Rheumatoid arthritis (HCC)    "hands mostly"  ( 01/19/2016)  . Situational depression 12-11-2009   "only when my mother died"   Family History  Problem Relation Age of Onset  . Cancer Mother   . Arthritis Mother   . Stroke Father   . Hypertension Father   . Colon cancer Neg Hx     BP 130/78 (BP Location: Left Arm, Patient Position: Sitting, Cuff Size: Normal)   Pulse 88   Temp 98.4 F (36.9 C) (Oral)   Ht 6' (1.829 m)   Wt 181 lb 6.4 oz (82.3 kg)   SpO2 97% Comment: RA  BMI 24.60 kg/m  General: Awake, alert, appears stated age HEENT: AT, [place], ears patent b/l and TM's neg, nares patent w/o discharge, pharynx pink and without exudates, MMM Neck: No masses or asymmetry Heart: RRR, no murmurs, no bruits Lungs: R lower lung feield expiratory wheezing, CTAB otherwise, no accessory muscle use Psych: Age appropriate judgment and insight, normal mood and affect  Community acquired pneumonia of right lower lobe of lung (HCC) - Plan: azithromycin (ZITHROMAX) 250 MG tablet   Orders as above.  ER if symptoms worsen or if he has worsening SOB. Counseled on smoking cessation. He is in the pre contemplative  phase. F/u for PCV23. Pt voiced understanding and agreement to the plan.  Jilda Roche Jessup, DO 06/26/16 4:17 PM

## 2016-06-26 NOTE — Progress Notes (Signed)
Pre visit review using our clinic review tool, if applicable. No additional management support is needed unless otherwise documented below in the visit note. 

## 2016-07-05 MED FILL — DALIRESP 500 MCG TABLET: 500 | 30 days supply | Qty: 30 | Fill #1

## 2016-07-05 MED FILL — BRILINTA 90 MG TABLET: 90 | 30 days supply | Qty: 60 | Fill #3

## 2016-07-05 MED FILL — ALPRAZolam 0.5 MG TABS: 0.5 | 15 days supply | Qty: 30 | Fill #2

## 2016-07-16 MED FILL — SPIRIVA 18 MCG CP-HANDIHALE: 18 | 90 days supply | Qty: 90 | Fill #1

## 2016-07-24 ENCOUNTER — Encounter: Payer: Self-pay | Admitting: Internal Medicine

## 2016-07-24 ENCOUNTER — Ambulatory Visit (INDEPENDENT_AMBULATORY_CARE_PROVIDER_SITE_OTHER): Payer: 59 | Admitting: Internal Medicine

## 2016-07-24 VITALS — BP 104/68 | HR 81 | Ht 72.0 in | Wt 184.0 lb

## 2016-07-24 DIAGNOSIS — J449 Chronic obstructive pulmonary disease, unspecified: Secondary | ICD-10-CM

## 2016-07-24 DIAGNOSIS — F17210 Nicotine dependence, cigarettes, uncomplicated: Secondary | ICD-10-CM | POA: Diagnosis not present

## 2016-07-24 DIAGNOSIS — F17200 Nicotine dependence, unspecified, uncomplicated: Secondary | ICD-10-CM

## 2016-07-24 NOTE — Patient Instructions (Addendum)
ICD-9-CM ICD-10-CM   1. COPD, severe (HCC) 496 J44.9   2. Smoking 305.1 F17.200   3. Moderate smoker (20 or less per day) 305.1 F17.210      COPD, severe (HCC) -  Stable diseaes - continue spiriva and advair and darliresp as before  - repeat PFT in 6 months (last in 2015)  - spiro, dlco, and BD responose,. No lung volumes - respect desire to refuse flu and pneumonia vaccines  -  Smoking - you know importance of quiting  Lung nodules - summer 2016 - do CT chest wo contrast in 6 months - summer 2018  followup 6 months or sooner if needed

## 2016-07-24 NOTE — Progress Notes (Signed)
Subjective:     Patient ID: Derek Conner, male   DOB: 10-16-1960, 55 y.o.   MRN: [id number]  HPI  OV 07/24/2016  Chief Complaint  Patient presents with  . Follow-up    Pt states overall his breathing is doing well. Pt states he has an occassional prod cough with clear to white mucus. Pt denies CP/tightness and f/c/s.     [name] with smoking, pulmonary nodules in September 2016, frequent COPD exacerbation and severe COPD based on 2000 1550  Admitted 05/16/2016 for COPD exacerbation. After that he followed up with my colleague and was doing well. Since then he's continued to do well. His COPD stable. He presents with his wife/girlfriend who is a Engineer, civil (consulting) and 60s at [employer]. He continues to refuse vaccines. He is taking triple inhaler therapy and also Roflumilast. He has lost 25 pounds due to dietary changes and it is intentional. He feels overall well. He continues to smoke a little bit and he knows the importance of quitting. Last pulmonary nodule was summer 2016 on CT chest. Chest x-rays this you have been clear.    has a past medical history of Asthma; CAP (community acquired pneumonia) (01/19/2016); Chronic bronchitis (HCC); COPD (chronic obstructive pulmonary disease) (HCC); Emphysema lung (HCC); Emphysema of lung (HCC); Hepatitis A infection (~ 1980); High cholesterol; Hypertension; Pneumonia (1960s); Rheumatoid arthritis (HCC); and Situational depression (2011).   reports that he has been smoking Cigarettes.  He has a 40.00 pack-year smoking history. He has never used smokeless tobacco.  Past Surgical History:  Procedure Laterality Date  . APPENDECTOMY  1980's  . CHEST TUBE INSERTION Left 1990's   "lung collapsed"  . CYSTECTOMY Left 1960's   "wrist"  . FOOT FRACTURE SURGERY Left ~ 2005   "it was crushed"  . IR GENERIC HISTORICAL  03/27/2016   IR ANGIOGRAM FOLLOW UP STUDY 03/27/2016 Julieanne Cotton, MD [facility]  . IR GENERIC HISTORICAL  03/27/2016   IR  ANGIOGRAM FOLLOW UP STUDY 03/27/2016 Julieanne Cotton, MD [facility]  . IR GENERIC HISTORICAL  03/27/2016   IR ANGIO INTRA EXTRACRAN SEL COM CAROTID INNOMINATE UNI L MOD SED 03/27/2016 Julieanne Cotton, MD [facility]  . IR GENERIC HISTORICAL  03/27/2016   IR ANGIOGRAM FOLLOW UP STUDY 03/27/2016 Julieanne Cotton, MD [facility]  . IR GENERIC HISTORICAL  03/27/2016   IR ANGIO VERTEBRAL SEL SUBCLAVIAN INNOMINATE UNI R MOD SED 03/27/2016 Julieanne Cotton, MD [facility]  . IR GENERIC HISTORICAL  03/27/2016   IR 3D INDEPENDENT WKST 03/27/2016 Julieanne Cotton, MD [facility]  . IR GENERIC HISTORICAL  03/27/2016   IR ANGIOGRAM FOLLOW UP STUDY 03/27/2016 Julieanne Cotton, MD [facility]  . IR GENERIC HISTORICAL  03/27/2016   IR NEURO EACH ADD'L AFTER BASIC UNI RIGHT (MS) 03/27/2016 Julieanne Cotton, MD [facility]  . IR GENERIC HISTORICAL  03/27/2016   IR ANGIOGRAM FOLLOW UP STUDY 03/27/2016 Julieanne Cotton, MD [facility]  . IR GENERIC HISTORICAL  03/27/2016   IR TRANSCATH/EMBOLIZ 03/27/2016 Julieanne Cotton, MD [facility]  . IR GENERIC HISTORICAL  03/27/2016   IR ANGIO INTRA EXTRACRAN SEL INTERNAL CAROTID UNI R MOD SED 03/27/2016 Julieanne Cotton, MD [facility]  . IR GENERIC HISTORICAL  03/27/2016   IR ANGIOGRAM FOLLOW UP STUDY 03/27/2016 Julieanne Cotton, MD [facility]  . IR GENERIC HISTORICAL  04/12/2016   IR RADIOLOGIST EVAL & MGMT 04/12/2016 [facility]  . RADIOLOGY WITH ANESTHESIA N/A 02/29/2016   Procedure: EMBOLIZATION     (RADIOLOGY WITH  ANESTHESIA);  Surgeon: Julieanne Cotton, MD;  Location: [facility] OR;  Service: Radiology;  Laterality: N/A;  . RADIOLOGY WITH ANESTHESIA N/A 03/27/2016   Procedure: Embolization;  Surgeon: Julieanne Cotton, MD;  Location: [facility];  Service: Radiology;  Laterality: N/A;  . VIDEO BRONCHOSCOPY Bilateral 12/06/2013   Procedure: VIDEO BRONCHOSCOPY WITHOUT FLUORO;  Surgeon: Alyson Reedy, MD;  [facility] ENDOSCOPY;  Service: Cardiopulmonary;   Laterality: Bilateral;    Allergies  Allergen Reactions  . Symbicort [Budesonide-Formoterol Fumarate] Shortness Of Breath, Nausea And Vomiting and Other (See Comments)    heachache  . Dulera [Mometasone Furo-Formoterol Fum] Other (See Comments)    Headache     Immunization History  Administered Date(s) Administered  . Tdap 08/29/2015    Family History  Problem Relation Age of Onset  . Cancer Mother   . Arthritis Mother   . Stroke Father   . Hypertension Father   . Colon cancer Neg Hx      Current Outpatient Prescriptions:  .  albuterol (PROVENTIL HFA;VENTOLIN HFA) 108 (90 Base) MCG/ACT inhaler, Inhale 2 puffs into the lungs every 6 (six) hours as needed for wheezing or shortness of breath., Disp: 3 Inhaler, Rfl: 3 .  ALPRAZolam (XANAX) 0.5 MG tablet, Take 1 tablet (0.5 mg total) by mouth 2 (two) times daily as needed for anxiety., Disp: 30 tablet, Rfl: 2 .  amLODipine (NORVASC) 10 MG tablet, TAKE 1 TABLET (10 MG) BY MOUTH DAILY., Disp: 90 tablet, Rfl: 3 .  aspirin EC 81 MG tablet, Take 81 mg by mouth daily., Disp: , Rfl:  .  atorvastatin (LIPITOR) 20 MG tablet, Take 1 tablet (20 mg total) by mouth daily., Disp: 30 tablet, Rfl: 2 .  cloNIDine (CATAPRES) 0.2 MG tablet, Take 1 tablet (0.2 mg total) by mouth 2 (two) times daily., Disp: 60 tablet, Rfl: 11 .  famotidine (PEPCID) 20 MG tablet, Take 1 tablet (20 mg total) by mouth at bedtime., Disp: 30 tablet, Rfl: 2 .  Fluticasone-Salmeterol (ADVAIR DISKUS) 500-50 MCG/DOSE AEPB, Inhale 1 puff into the lungs 2 (two) times daily., Disp: 60 each, Rfl: 2 .  pantoprazole (PROTONIX) 40 MG tablet, Take 1 tablet (40 mg total) by mouth daily at 12 noon., Disp: 30 tablet, Rfl: 3 .  roflumilast (DALIRESP) 500 MCG TABS tablet, Take 1 tablet (500 mcg total) by mouth daily., Disp: 30 tablet, Rfl: 3 .  ticagrelor (BRILINTA) 90 MG TABS tablet, Take 45-90 mg by mouth 2 (two) times daily. 1 in the morning and 1/2 in the evening, Disp: , Rfl:  .   tiotropium (SPIRIVA HANDIHALER) 18 MCG inhalation capsule, PLACE 1 CAPSULE INTO INHALER AND INHALE INTO LUNGS DAILY., Disp: 90 capsule, Rfl: 3 .  Fluticasone-Salmeterol (ADVAIR DISKUS) 500-50 MCG/DOSE AEPB, Inhale 1 puff into the lungs 2 (two) times daily., Disp: 2 each, Rfl: 0 No current facility-administered medications for this visit.   Facility-Administered Medications Ordered in Other Visits:  .  0.9 %  sodium chloride infusion, , Intravenous, Continuous, Ralene Muskrat, PA-C .  aspirin EC tablet 325 mg, 325 mg, Oral, 60 min Pre-Op, Ralene Muskrat, PA-C .  niMODipine (NIMOTOP) capsule 0-60 mg, 0-60 mg, Oral, 60 min Pre-Op, Ralene Muskrat, PA-C   Review of Systems     Objective:   Physical Exam  Constitutional: He is oriented to person, place, and time. He appears well-developed and well-nourished. No distress.  HENT:  Head: Normocephalic and atraumatic.  Right Ear: External ear normal.  Left Ear: External ear normal.  Mouth/Throat: Oropharynx is  clear and moist. No oropharyngeal exudate.  Eyes: Conjunctivae and EOM are normal. Pupils are equal, round, and reactive to light. Right eye exhibits no discharge. Left eye exhibits no discharge. No scleral icterus.  Neck: Normal range of motion. Neck supple. No JVD present. No tracheal deviation present. No thyromegaly present.  Cardiovascular: Normal rate, regular rhythm and intact distal pulses.  Exam reveals no gallop and no friction rub.   No murmur heard. Pulmonary/Chest: Effort normal and breath sounds normal. No respiratory distress. He has no wheezes. He has no rales. He exhibits no tenderness.  Abdominal: Soft. Bowel sounds are normal. He exhibits no distension and no mass. There is no tenderness. There is no rebound and no guarding.  Musculoskeletal: Normal range of motion. He exhibits no edema or tenderness.  Lymphadenopathy:    He has no cervical adenopathy.  Neurological: He is alert and oriented to person, place, and time. He  has normal reflexes. No cranial nerve deficit. Coordination normal.  Skin: Skin is warm and dry. No rash noted. He is not diaphoretic. No erythema. No pallor.  Psychiatric: He has a normal mood and affect. His behavior is normal. Judgment and thought content normal.  Nursing note and vitals reviewed.   Vitals:   07/24/16 0853  BP: 104/68  Pulse: 81  SpO2: 98%  Weight: 184 lb (83.5 kg)  Height: 6' (1.829 m)         Plan:       COPD, severe (HCC) -  Stable diseaes - continue spiriva and advair and darliresp as before  - repeat PFT in 6 months (last in 2015)  - spiro, dlco, and BD responose,. No lung volumes - respect desire to refuse flu and pneumonia vaccines  -  Smoking - you know importance of quiting  Lung nodules - summer 2016 - do CT chest wo contrast in 6 months - summer 2018  followup 6 months or sooner if needed    Dr. Kalman Shan, M.D., [facility].C.P Pulmonary and Critical Care Medicine Staff Physician [facility] [place] [facility] Pager: [phone], If no answer or between  15:00h - 7:00h: call [phone]  07/24/2016 9:08 AM

## 2016-07-24 NOTE — Addendum Note (Signed)
Addended by: Sheran Luz on: 07/24/2016 09:18 AM   Modules accepted: Orders

## 2016-07-30 MED FILL — ADVAIR 500/50 DISKUS: 500-50 | 90 days supply | Qty: 180 | Fill #2

## 2016-07-30 MED FILL — DALIRESP 500 MCG TABLET: 500 | 30 days supply | Qty: 30 | Fill #2

## 2016-07-30 MED FILL — AMLODIPINE BESYLATE 10 MG T: 10 | 90 days supply | Qty: 90 | Fill #1

## 2016-07-30 MED FILL — LISINOPRIL 10 MG TABLET: 10 | 90 days supply | Qty: 90 | Fill #1

## 2016-07-30 MED FILL — ATORVASTATIN 20 MG TABLET: 20 | 90 days supply | Qty: 90 | Fill #1

## 2016-08-06 MED FILL — VENTOLIN HFA 90 MCG INHALER: 108 (90 BAS | 90 days supply | Qty: 54 | Fill #1

## 2016-08-26 ENCOUNTER — Telehealth: Payer: Self-pay | Admitting: Internal Medicine

## 2016-08-26 MED ORDER — PREDNISONE 10 MG PO TABS: ORAL_TABLET | ORAL | 0 refills | Status: DC

## 2016-08-26 MED ORDER — DOXYCYCLINE HYCLATE 100 MG PO TABS: 100.0000 mg | ORAL_TABLET | Freq: Two times a day (BID) | ORAL | 0 refills | Status: DC

## 2016-08-26 MED FILL — predniSONE 10 MG TABS: 10 | 12 days supply | Qty: 30 | Fill #0

## 2016-08-26 MED FILL — DOXYCYCLINE HYCLATE 100 MG: 100 | 7 days supply | Qty: 14 | Fill #0

## 2016-08-26 NOTE — Telephone Encounter (Signed)
Pt aware of RB's recommendations & voiced his understanding. Rx sent to preferred pharmacy. Nothing further needed.

## 2016-08-26 NOTE — Telephone Encounter (Signed)
Spoke with pt, who feels he is developing an URI. Pt c/o chest tightness, prod cough with yellow mucus, wheezing, feels that he has had a low grade temp (unsure of how high) X 3d. Pt taking aspirin and tylenol with no improvement.  Pt is requesting an abx to be sent in.  RB please advise, as MR is night float. Thanks.

## 2016-08-26 NOTE — Telephone Encounter (Signed)
Prednisone taper >> Take 40mg  daily for 3 days, then 30mg  daily for 3 days, then 20mg  daily for 3 days, then 10mg  daily for 3 days, then stop Doxycycline 100mg  bid x 7 days.

## 2016-08-30 ENCOUNTER — Other Ambulatory Visit: Payer: Self-pay | Admitting: Rheumatology

## 2016-08-30 ENCOUNTER — Telehealth (HOSPITAL_COMMUNITY): Payer: Self-pay

## 2016-08-30 MED FILL — BRILINTA 90 MG TABLET: 90 | 30 days supply | Qty: 60 | Fill #0

## 2016-08-30 MED FILL — PANTOPRAZOLE SOD DR 40 MG T: 40 | 30 days supply | Qty: 30 | Fill #1

## 2016-08-30 MED FILL — DALIRESP 500 MCG TABLET: 500 | 30 days supply | Qty: 30 | Fill #3

## 2016-08-30 MED FILL — cloNIDine HCL 0.2 MG TABS: 0.2 | 90 days supply | Qty: 180 | Fill #1

## 2016-08-30 NOTE — Telephone Encounter (Signed)
Called pt's wife to inform her that refill request has been signed by the PA and faxed back  To the pharmacy. AW

## 2016-09-05 ENCOUNTER — Telehealth: Payer: Self-pay | Admitting: Medical

## 2016-09-05 ENCOUNTER — Ambulatory Visit (INDEPENDENT_AMBULATORY_CARE_PROVIDER_SITE_OTHER): Payer: 59 | Admitting: Medical

## 2016-09-05 ENCOUNTER — Encounter: Payer: Self-pay | Admitting: Medical

## 2016-09-05 VITALS — BP 153/84 | HR 82 | Temp 98.6°F | Ht 72.0 in | Wt 186.4 lb

## 2016-09-05 DIAGNOSIS — Z0000 Encounter for general adult medical examination without abnormal findings: Secondary | ICD-10-CM | POA: Diagnosis not present

## 2016-09-05 DIAGNOSIS — H9319 Tinnitus, unspecified ear: Secondary | ICD-10-CM

## 2016-09-05 DIAGNOSIS — Z1159 Encounter for screening for other viral diseases: Secondary | ICD-10-CM | POA: Diagnosis not present

## 2016-09-05 DIAGNOSIS — Z125 Encounter for screening for malignant neoplasm of prostate: Secondary | ICD-10-CM | POA: Diagnosis not present

## 2016-09-05 MED ORDER — FAMOTIDINE 20 MG PO TABS: 20.0000 mg | ORAL_TABLET | Freq: Every day | ORAL | 1 refills | Status: DC

## 2016-09-05 MED ORDER — FAMOTIDINE 20 MG PO TABS: 20.0000 mg | ORAL_TABLET | Freq: Every day | ORAL | 2 refills | Status: DC

## 2016-09-05 MED ORDER — ALPRAZOLAM 0.5 MG PO TABS: ORAL_TABLET | ORAL | 0 refills | Status: DC

## 2016-09-05 MED ORDER — PANTOPRAZOLE SODIUM 40 MG PO TBEC: 40.0000 mg | DELAYED_RELEASE_TABLET | Freq: Every day | ORAL | 3 refills | Status: DC

## 2016-09-05 MED FILL — FAMOTIDINE 20 MG TABLET: 20 | 90 days supply | Qty: 90 | Fill #0

## 2016-09-05 MED FILL — ALPRAZolam 0.5 MG TABS: 0.5 | 30 days supply | Qty: 30 | Fill #0

## 2016-09-05 NOTE — Progress Notes (Addendum)
Subjective:    Patient ID: Derek Conner, male    DOB: May 23, 1961, 56 y.o.   MRN: [id number]  HPI  Pt in for cpe.   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 1-2 shot of white russians a day.  Pt flu vaccine  Hep c screening.  Hiv screening up to date.   tdap up to date.  On review I don't see prior psa.  Pt had colonoscopy in 30-Nov-2014. Repeat in 10 years.  ekg done 11/30/2014 normal. Echo in past 55-60% ED.  Pt still smoking.  Pt last saw his pulmonologist in December.   Pt in states he gets rare strong anxiety/almost panic attack about every 12 days. Pt was given some xanax around nd time he was hospitalized for xanax in the past.   Review of Systems  Constitutional: Negative for chills, diaphoresis, fatigue and unexpected weight change.  HENT: Negative for congestion and ear pain.        Constant bilateral ringing to both ears for months.   Respiratory: Negative for cough, chest tightness, shortness of breath and wheezing.   Cardiovascular: Negative for chest pain and palpitations.  Gastrointestinal: Negative for abdominal pain.  Musculoskeletal: Negative for back pain, gait problem, joint swelling, myalgias and neck stiffness.  Neurological: Negative for dizziness, seizures, speech difficulty, weakness and headaches.  Hematological: Negative for adenopathy. Does not bruise/bleed easily.  Psychiatric/Behavioral: Negative for agitation and confusion. The patient is nervous/anxious.    Past Medical History:  Diagnosis Date  . Asthma   . CAP (community acquired pneumonia) 01/19/2016  . Chronic bronchitis (HCC)   . COPD (chronic obstructive pulmonary disease) (HCC)   . Emphysema lung (HCC)   . Emphysema of lung (HCC)   . Hepatitis A infection ~ 1980  . High cholesterol   . Hypertension   . Pneumonia  1960s  . Rheumatoid arthritis (HCC)    "hands mostly"  ( 01/19/2016)  . Situational depression Nov 29, 2009   "only when my mother died"     Social History   Social History  . Marital status: Married    Spouse name: N/A  . Number of children: N/A  . Years of education: N/A   Occupational History  . Not on file.   Social History Main Topics  . Smoking status: Current Every Day Smoker    Packs/day: 1.00    Years: 40.00    Types: Cigarettes    Last attempt to quit: 01/19/2016  . Smokeless tobacco: Never Used     Comment: 1/2 ppd 12.20.17  . Alcohol use 6.0 oz/week    10 Cans of beer per week     Comment: 12/04/2013 "2beers/day"  . Drug use: No     Comment: 01/19/2016 "nothing in the last 3 months"  . Sexual activity: Yes   Other Topics Concern  . Not on file   Social History Narrative  . No narrative on file    Past Surgical History:  Procedure Laterality Date  . APPENDECTOMY  1980's  . CHEST TUBE INSERTION Left 1990's   "lung collapsed"  . CYSTECTOMY Left 1960's   "wrist"  . FOOT FRACTURE SURGERY Left ~ 11-30-03   "it was crushed"  . IR GENERIC HISTORICAL  03/27/2016   IR ANGIOGRAM FOLLOW UP STUDY 03/27/2016 Julieanne Cotton, MD [facility]  . IR GENERIC HISTORICAL  03/27/2016   IR ANGIOGRAM FOLLOW UP STUDY 03/27/2016 Julieanne Cotton, MD [facility]  . IR GENERIC HISTORICAL  03/27/2016   IR ANGIO INTRA EXTRACRAN SEL COM CAROTID INNOMINATE UNI L MOD SED 03/27/2016 Julieanne Cotton, MD [facility]  . IR GENERIC HISTORICAL  03/27/2016   IR ANGIOGRAM FOLLOW UP STUDY 03/27/2016 Julieanne Cotton, MD [facility]  . IR GENERIC HISTORICAL  03/27/2016   IR ANGIO VERTEBRAL SEL SUBCLAVIAN INNOMINATE UNI R MOD SED 03/27/2016 Julieanne Cotton, MD [facility]  . IR GENERIC HISTORICAL  03/27/2016   IR 3D INDEPENDENT WKST 03/27/2016 Julieanne Cotton, MD [facility]  . IR GENERIC HISTORICAL  03/27/2016   IR ANGIOGRAM FOLLOW UP STUDY 03/27/2016 Julieanne Cotton, MD [facility]  . IR  GENERIC HISTORICAL  03/27/2016   IR NEURO EACH ADD'L AFTER BASIC UNI RIGHT (MS) 03/27/2016 Julieanne Cotton, MD [facility]  . IR GENERIC HISTORICAL  03/27/2016   IR ANGIOGRAM FOLLOW UP STUDY 03/27/2016 Julieanne Cotton, MD [facility]  . IR GENERIC HISTORICAL  03/27/2016   IR TRANSCATH/EMBOLIZ 03/27/2016 Julieanne Cotton, MD [facility]  . IR GENERIC HISTORICAL  03/27/2016   IR ANGIO INTRA EXTRACRAN SEL INTERNAL CAROTID UNI R MOD SED 03/27/2016 Julieanne Cotton, MD [facility]  . IR GENERIC HISTORICAL  03/27/2016   IR ANGIOGRAM FOLLOW UP STUDY 03/27/2016 Julieanne Cotton, MD [facility]  . IR GENERIC HISTORICAL  04/12/2016   IR RADIOLOGIST EVAL & MGMT 04/12/2016 [facility]  . RADIOLOGY WITH ANESTHESIA N/A 02/29/2016   Procedure: EMBOLIZATION     (RADIOLOGY WITH ANESTHESIA);  Surgeon: Julieanne Cotton, MD;  Location: [facility] OR;  Service: Radiology;  Laterality: N/A;  . RADIOLOGY WITH ANESTHESIA N/A 03/27/2016   Procedure: Embolization;  Surgeon: Julieanne Cotton, MD;  Location: [facility];  Service: Radiology;  Laterality: N/A;  . VIDEO BRONCHOSCOPY Bilateral 12/06/2013   Procedure: VIDEO BRONCHOSCOPY WITHOUT FLUORO;  Surgeon: Alyson Reedy, MD;  Location: [facility] ENDOSCOPY;  Service: Cardiopulmonary;  Laterality: Bilateral;    Family History  Problem Relation Age of Onset  . Cancer Mother   . Arthritis Mother   . Stroke Father   . Hypertension Father   . Colon cancer Neg Hx     Allergies  Allergen Reactions  . Symbicort [Budesonide-Formoterol Fumarate] Shortness Of Breath, Nausea And Vomiting and Other (See Comments)    heachache  . Dulera [Mometasone Furo-Formoterol Fum] Other (See Comments)    Headache     Current Outpatient Prescriptions on File Prior to Visit  Medication Sig Dispense Refill  . albuterol (PROVENTIL HFA;VENTOLIN HFA) 108 (90 Base) MCG/ACT inhaler Inhale 2 puffs into the lungs every 6 (six) hours as needed for wheezing or shortness of breath. 3 Inhaler 3  . ALPRAZolam  (XANAX) 0.5 MG tablet Take 1 tablet (0.5 mg total) by mouth 2 (two) times daily as needed for anxiety. 30 tablet 2  . amLODipine (NORVASC) 10 MG tablet TAKE 1 TABLET (10 MG) BY MOUTH DAILY. 90 tablet 3  . aspirin EC 81 MG tablet Take 81 mg by mouth daily.    Marland Kitchen atorvastatin (LIPITOR) 20 MG tablet Take 1 tablet (20 mg total) by mouth daily. 30 tablet 2  . cloNIDine (CATAPRES) 0.2 MG tablet Take 1 tablet (0.2 mg total) by mouth 2 (two) times daily. 60 tablet 11  . doxycycline (VIBRA-TABS) 100 MG tablet Take 1 tablet (100 mg total) by mouth 2 (two) times daily. 14 tablet 0  . famotidine (PEPCID) 20 MG  tablet Take 1 tablet (20 mg total) by mouth at bedtime. 30 tablet 2  . Fluticasone-Salmeterol (ADVAIR DISKUS) 500-50 MCG/DOSE AEPB Inhale 1 puff into the lungs 2 (two) times daily. 60 each 2  . pantoprazole (PROTONIX) 40 MG tablet Take 1 tablet (40 mg total) by mouth daily at 12 noon. 30 tablet 3  . predniSONE (DELTASONE) 10 MG tablet Take 4 tabs X 3 days, 3 tabs X 3 days, 2 tabs X 3 days, 1 tab X 3 days then stop 30 tablet 0  . roflumilast (DALIRESP) 500 MCG TABS tablet Take 1 tablet (500 mcg total) by mouth daily. 30 tablet 3  . ticagrelor (BRILINTA) 90 MG TABS tablet Take 45-90 mg by mouth 2 (two) times daily. 1 in the morning and 1/2 in the evening    . tiotropium (SPIRIVA HANDIHALER) 18 MCG inhalation capsule PLACE 1 CAPSULE INTO INHALER AND INHALE INTO LUNGS DAILY. 90 capsule 3  . Fluticasone-Salmeterol (ADVAIR DISKUS) 500-50 MCG/DOSE AEPB Inhale 1 puff into the lungs 2 (two) times daily. 2 each 0   Current Facility-Administered Medications on File Prior to Visit  Medication Dose Route Frequency Provider Last Rate Last Dose  . 0.9 %  sodium chloride infusion   Intravenous Continuous Ralene Muskrat, PA-C      . aspirin EC tablet 325 mg  325 mg Oral 60 min Pre-Op Ralene Muskrat, PA-C      . niMODipine (NIMOTOP) capsule 0-60 mg  0-60 mg Oral 60 min Pre-Op Ralene Muskrat, PA-C        BP (!) 153/84    Pulse 82   Temp 98.6 F (37 C) (Oral)   Ht 6' (1.829 m)   Wt 186 lb 6.4 oz (84.6 kg)   SpO2 97%   BMI 25.28 kg/m       Objective:   Physical Exam   General  Mental Status - Alert. General Appearance - Well groomed. Not in acute distress.  Skin Rashes- No Rashes.  HEENT Head- Normal. Ear Auditory Canal - Left- Normal. Right - Normal.Tympanic Membrane- Left- Normal. Right- Normal. Eye Sclera/Conjunctiva- Left- Normal. Right- Normal. Nose & Sinuses Nasal Mucosa- Left-  Boggy and Congested. Right-  Boggy and  Congested.Bilateral  No maxillary and no frontal sinus pressure. Mouth & Throat Lips: Upper Lip- Normal: no dryness, cracking, pallor, cyanosis, or vesicular eruption. Lower Lip-Normal: no dryness, cracking, pallor, cyanosis or vesicular eruption. Buccal Mucosa- Bilateral- No Aphthous ulcers. Oropharynx- No Discharge or Erythema. Tonsils: Characteristics- Bilateral- No Erythema or Congestion. Size/Enlargement- Bilateral- No enlargement. Discharge- bilateral-None.  Neck Neck- Supple. No Masses.   Chest and Lung Exam Auscultation: Breath Sounds:-Clear even and unlabored.  Cardiovascular Auscultation:Rythm- Regular, rate and rhythm. Murmurs & Other Heart Sounds:Ausculatation of the heart reveal- No Murmurs.  Lymphatic Head & Neck General Head & Neck Lymphatics: Bilateral: Description- No Localized lymphadenopathy.  Genital and rectal exam- pt declined.   Neurologic Cranial Nerve exam:- CN III-XII intact(No nystagmus), symmetric smile. Strength:- 5/5 equal and symmetric strength both upper and lower extremities.      Assessment & Plan:  For you wellness exam today I have ordered cbc, cmp, tsh, lipid panel, ua and psa. Pt declined hep c testing.  Flu vaccine declined today.  Recommend exercise and healthy diet.  We will let you know lab results as they come in.  Follow up date appointment will be determined after lab review.   Will rx very limted  number of xanax. 30 tabs. Use sparingly as you describe.(will follow use pattern  and determine need for contract and uds).   Regarding your bp readings mild high today. I want you to get wife/RN to check you bp daily for one week and my chart or call me the results.  Brindle Leyba, Ramon Dredge, PA-C

## 2016-09-05 NOTE — Progress Notes (Signed)
Pre visit review using our clinic tool,if applicable. No additional management support is needed unless otherwise documented below in the visit note.  

## 2016-09-05 NOTE — Patient Instructions (Addendum)
For you wellness exam today I have ordered cbc, cmp, tsh, lipid panel, ua and psa. Pt declined hep c testing.(labs are future but fasting)  Flu vaccine declined today.  Recommend exercise and healthy diet.  We will let you know lab results as they come in.  Follow up date appointment will be determined after lab review.   Will rx very limted number of xanax. 30 tabs. Use sparingly as you describe.  Regarding your bp readings mild high today. I want you to get wife/RN to check you bp daily for one week and my chart or call me the results.

## 2016-09-05 NOTE — Telephone Encounter (Signed)
Caller name: Relationship to patient:Self  Can be reached: [phone]    Reason for call: Patient request a referral to see an ENT about his ear. Wife was on the phone and states the appt was suppose to be about his ear, not a complete CPE. Has to be a [facility] ENT

## 2016-09-05 NOTE — Telephone Encounter (Signed)
Wife Lurena Joiner [id number]) request to have a call back from Supervisor in reference to appointment for husband being scheduled for the wrong reason. States when she called on 1/26 she asked for an appt for her husband to be seen for ear pain and it was scheduled for a CPE. States that because of the scheduling of the appt, husband forgot to mention the ear but needs a referral to an ENT and does not want to come back in to see provider. Wife was very irate on the phone but patient was calm during the conversation. Patient stated that he and provider started talking about something else and he totally forgot to mention the ear. Message was sent to provider requesting a referral to an ENT. Patient was informed that it may be tomorrow when he gets a call back and patient was ok with that. Wife stated that she needed to speak with someone that can ensure that in the future patients are scheduled for what they ask for.

## 2016-09-06 NOTE — Telephone Encounter (Signed)
I need someone to call and clarify with pt wife what issue was regarding mischeduling him. Let me know how that goes please.  Also if you could get quick description of recent ear symptoms. Looks like he has seen ENT before? Was it Dr. Delton Coombes. Can put that referral in. Just need info.

## 2016-09-06 NOTE — Telephone Encounter (Signed)
Called wife to follow up.  She stated that she was not upset, that there was just a miscommunication.  She said when she originally called to schedule the patient's appt, she mentioned that the appt was for ringing in the ears, instead patient was scheduled for physical.  She said she's not upset that patient had a physical, she just wanted provider to address patient's ears.  She said she has [employer] insurance and while an ENT referral is not required, it is recommended so that ENTs may have medical report with referral.  She again repeated that she is not upset and that her and husband loves [facility] and the staff here.  Ask if this was a good time to call her husband, she said yes and mobile number was given as preferred contact number.   Called patient.  He confirmed that he has been experiencing bilateral ringing in his ears for some time now.  States it's constant and not associated with any other symptoms.  He denied headache, dizziness, blurred vision, fever, or pain.  He does not work in a loud environment.  He only takes 81 mg of aspirin.  He is agreeable to coming in for an appt if needed, but would prefer not to come in if he doesn't have to.  Please advise.

## 2016-09-06 NOTE — Telephone Encounter (Signed)
Derek Conner, could you place handle the requested Referral to ENT/SLS 02/02 [place], could you please have a supervisor return patient call, as requested. Thanks/SLS 02/02

## 2016-09-08 NOTE — Telephone Encounter (Signed)
Referral made to ent

## 2016-09-08 NOTE — Addendum Note (Signed)
Addended by: Gwenevere Abbot on: 09/08/2016 07:49 AM   Modules accepted: Orders

## 2016-09-11 NOTE — Telephone Encounter (Signed)
Patient's spouse called asking if patient needs to be seen by [facility] before or after seeing the ENT. Please advise  Phone:[phone]

## 2016-09-11 NOTE — Telephone Encounter (Signed)
Derek Conner pt wife wants update on ent  referral. He does not need to come in for another visit. I added tinnitus complaint to his review of systems on physical exam note and made referral. Will you give his wife update on referral to ent.

## 2016-09-12 NOTE — Telephone Encounter (Signed)
Patient scheduled for 09/25/16 w/ Dr Jenne Pane, wife aware

## 2016-09-13 ENCOUNTER — Other Ambulatory Visit (INDEPENDENT_AMBULATORY_CARE_PROVIDER_SITE_OTHER): Payer: 59

## 2016-09-13 DIAGNOSIS — Z0000 Encounter for general adult medical examination without abnormal findings: Secondary | ICD-10-CM | POA: Diagnosis not present

## 2016-09-13 DIAGNOSIS — Z125 Encounter for screening for malignant neoplasm of prostate: Secondary | ICD-10-CM | POA: Diagnosis not present

## 2016-09-13 LAB — POC URINALSYSI DIPSTICK (AUTOMATED)
BILIRUBIN UA: NEGATIVE
Blood, UA: NEGATIVE
GLUCOSE UA: NEGATIVE
KETONES UA: NEGATIVE
LEUKOCYTES UA: NEGATIVE
Nitrite, UA: NEGATIVE
PROTEIN UA: NEGATIVE
SPEC GRAV UA: 1.025
Urobilinogen, UA: NEGATIVE
pH, UA: 6

## 2016-09-13 LAB — COMPLETE METABOLIC PANEL WITH GFR
ALBUMIN: 4.2 g/dL (ref 3.6–5.1)
ALK PHOS: 87 U/L (ref 40–115)
ALT: 17 U/L (ref 9–46)
AST: 14 U/L (ref 10–35)
BILIRUBIN TOTAL: 0.5 mg/dL (ref 0.2–1.2)
BUN: 18 mg/dL (ref 7–25)
CALCIUM: 9.7 mg/dL (ref 8.6–10.3)
CO2: 26 mmol/L (ref 20–31)
Chloride: 105 mmol/L (ref 98–110)
Creat: 1.27 mg/dL (ref 0.70–1.33)
GFR, EST AFRICAN AMERICAN: 73 mL/min (ref >=60)
GFR, EST NON AFRICAN AMERICAN: 63 mL/min (ref >=60)
Glucose, Bld: 107 mg/dL — ABNORMAL HIGH (ref 65–99)
POTASSIUM: 4.6 mmol/L (ref 3.5–5.3)
Sodium: 139 mmol/L (ref 135–146)
TOTAL PROTEIN: 6.7 g/dL (ref 6.1–8.1)

## 2016-09-13 LAB — LIPID PANEL
CHOL/HDL RATIO: 3
CHOLESTEROL: 171 mg/dL (ref 0–200)
HDL: 62.4 mg/dL (ref >39.00)
LDL Cholesterol: 75 mg/dL (ref 0–99)
NonHDL: 108.94
TRIGLYCERIDES: 168 mg/dL — AB (ref 0.0–149.0)
VLDL: 33.6 mg/dL (ref 0.0–40.0)

## 2016-09-13 LAB — CBC WITH DIFFERENTIAL/PLATELET
Basophils Absolute: 0.1 10*3/uL (ref 0.0–0.1)
Basophils Relative: 0.7 % (ref 0.0–3.0)
EOS PCT: 3.1 % (ref 0.0–5.0)
Eosinophils Absolute: 0.4 10*3/uL (ref 0.0–0.7)
HEMATOCRIT: 43 % (ref 39.0–52.0)
HEMOGLOBIN: 13.9 g/dL (ref 13.0–17.0)
LYMPHS PCT: 26.3 % (ref 12.0–46.0)
Lymphs Abs: 3.1 10*3/uL (ref 0.7–4.0)
MCHC: 32.2 g/dL (ref 30.0–36.0)
MCV: 86.4 fl (ref 78.0–100.0)
MONO ABS: 1.1 10*3/uL — AB (ref 0.1–1.0)
MONOS PCT: 9.5 % (ref 3.0–12.0)
Neutro Abs: 7 10*3/uL (ref 1.4–7.7)
Neutrophils Relative %: 60.4 % (ref 43.0–77.0)
Platelets: 354 10*3/uL (ref 150.0–400.0)
RBC: 4.98 Mil/uL (ref 4.22–5.81)
RDW: 14.8 % (ref 11.5–15.5)
WBC: 11.6 10*3/uL — AB (ref 4.0–10.5)

## 2016-09-13 LAB — TSH: TSH: 1.11 u[IU]/mL (ref 0.35–4.50)

## 2016-09-13 LAB — PSA: PSA: 1.55 ng/mL (ref 0.10–4.00)

## 2016-09-23 ENCOUNTER — Telehealth: Payer: Self-pay | Admitting: Internal Medicine

## 2016-09-23 MED ORDER — PREDNISONE 10 MG PO TABS: ORAL_TABLET | ORAL | 0 refills | Status: DC

## 2016-09-23 MED ORDER — CEFDINIR 300 MG PO CAPS: 300.0000 mg | ORAL_CAPSULE | Freq: Two times a day (BID) | ORAL | 0 refills | Status: DC

## 2016-09-23 MED FILL — CEFDINIR 300 MG CAPSULE: 300 | 5 days supply | Qty: 10 | Fill #0

## 2016-09-23 MED FILL — predniSONE 10 MG TABS: 10 | 8 days supply | Qty: 15 | Fill #0

## 2016-09-23 NOTE — Telephone Encounter (Signed)
Spoke with pt's wife, Lurena Joiner. She would like for [place] to call the pt on his cell phone. Called and spoke with pt. Pt reports SOB, chest tightness, wheezing and coughing. Cough is producing clear mucus. Denies fever/sweats/chills. Symptoms started yesterday. Pt would like MR's recommendations.  MR - please advise. Thanks.

## 2016-09-23 NOTE — Telephone Encounter (Signed)
Spoke with pt. He is aware of MR's recommendations. Rxs have been sent in. Nothing further was needed. 

## 2016-09-23 NOTE — Telephone Encounter (Signed)
  Allergies  Allergen Reactions  . Symbicort [Budesonide-Formoterol Fumarate] Shortness Of Breath, Nausea And Vomiting and Other (See Comments)    heachache  . Dulera [Mometasone Furo-Formoterol Fum] Other (See Comments)    Headache    He has aecopd likely  Plan Take prednisone 40 mg daily x 2 days, then 20mg  daily x 2 days, then 10mg  daily x 2 days, then 5mg  daily x 2 days and stop  Omnicef 300mg  po bid x 5 days  If worse, go to ER   Nebs/mdi as before but increase albuterol prn  Dr. , M.D., [facility].C.P Pulmonary and Critical Care Medicine Staff Physician [facility] [place] [facility] Pager: [phone], If no answer or between  15:00h - 7:00h: call [phone]  09/23/2016 10:22 AM

## 2016-09-25 DIAGNOSIS — H9313 Tinnitus, bilateral: Secondary | ICD-10-CM | POA: Diagnosis not present

## 2016-09-25 DIAGNOSIS — J343 Hypertrophy of nasal turbinates: Secondary | ICD-10-CM | POA: Diagnosis not present

## 2016-09-25 DIAGNOSIS — H903 Sensorineural hearing loss, bilateral: Secondary | ICD-10-CM | POA: Diagnosis not present

## 2016-09-25 DIAGNOSIS — Z87891 Personal history of nicotine dependence: Secondary | ICD-10-CM | POA: Diagnosis not present

## 2016-09-27 ENCOUNTER — Telehealth: Payer: Self-pay | Admitting: Internal Medicine

## 2016-09-27 MED ORDER — PREDNISONE 10 MG PO TABS: ORAL_TABLET | ORAL | 0 refills | Status: DC

## 2016-09-27 MED ORDER — DOXYCYCLINE HYCLATE 100 MG PO TABS
ORAL_TABLET | ORAL | 0 refills | Status: DC
Start: 2016-09-27 — End: 2016-11-08

## 2016-09-27 MED FILL — DOXYCYCLINE HYCLATE 100 MG: 100 | 5 days supply | Qty: 10 | Fill #0

## 2016-09-27 MED FILL — predniSONE 10 MG TABS: 10 | 12 days supply | Qty: 30 | Fill #0

## 2016-09-27 NOTE — Telephone Encounter (Signed)
Usually whent ehre is no response to first line, it is time to get evaluated in ER tonight or urgent care because it could be pneumonia or PE or other problems. So that iss my recommendation but if he wont: then   Please take Take prednisone 40mg  once daily x 3 days, then 30mg  once daily x 3 days, then 20mg  once daily x 3 days, then prednisone 10mg  once daily  x 3 days and stop  Take doxycycline 100mg  po twice daily x 5 days; take after meals and avoid sunlight   Allergies  Allergen Reactions  . Symbicort [Budesonide-Formoterol Fumarate] Shortness Of Breath, Nausea And Vomiting and Other (See Comments)    heachache  . Dulera [Mometasone Furo-Formoterol Fum] Other (See Comments)    Headache      Dr. , M.D., [facility].C.P Pulmonary and Critical Care Medicine Staff Physician [facility] [place] [facility] Pager: [phone], If no answer or between  15:00h - 7:00h: call [phone]  09/27/2016 1:51 PM

## 2016-09-27 NOTE — Telephone Encounter (Signed)
Called and spoke with pt and he stated that he called earlier this week and and was given abx and pred by MR.  He stated that he is near the end of both and he feels worse and not better.  He stated that he has had low grade temp, his SOB is extreme with very little exertion, cough with foamy looking sputum, increase in nasal drainage.  Pt said that he is going out of town in the morning at 5 am and he wants to get better and not worse while he is gone.  MR please advise thanks.   Allergies  Allergen Reactions  . Symbicort [Budesonide-Formoterol Fumarate] Shortness Of Breath, Nausea And Vomiting and Other (See Comments)    heachache  . Dulera [Mometasone Furo-Formoterol Fum] Other (See Comments)    Headache

## 2016-09-27 NOTE — Telephone Encounter (Signed)
Pt aware of MR's recommendations. Rx sent to referred pharmacy. Nothing further needed.

## 2016-10-07 MED FILL — PANTOPRAZOLE SOD DR 40 MG T: 40 | 30 days supply | Qty: 30 | Fill #0

## 2016-10-07 MED FILL — BRILINTA 90 MG TABLET: 90 | 30 days supply | Qty: 60 | Fill #1

## 2016-10-07 MED FILL — SPIRIVA 18 MCG CP-HANDIHALE: 18 | 90 days supply | Qty: 90 | Fill #2

## 2016-10-08 ENCOUNTER — Telehealth: Payer: Self-pay | Admitting: *Deleted

## 2016-10-08 ENCOUNTER — Other Ambulatory Visit: Payer: Self-pay | Admitting: Internal Medicine

## 2016-10-08 MED ORDER — ALPRAZOLAM 0.5 MG PO TABS: ORAL_TABLET | ORAL | 0 refills | Status: DC

## 2016-10-08 MED ORDER — ROFLUMILAST 500 MCG PO TABS: 500.0000 ug | ORAL_TABLET | Freq: Every day | ORAL | 3 refills | Status: DC

## 2016-10-08 MED FILL — DALIRESP 500 MCG TABLET: 500 | 30 days supply | Qty: 30 | Fill #0

## 2016-10-08 NOTE — Telephone Encounter (Signed)
Faxed refill request received from [facility] for Alprazolam 0.5mg  Last filled by MD on 09/05/16, #30x0 Last AEX - 09/05/16 Please Advise on refills/SLS 03/06

## 2016-10-08 NOTE — Telephone Encounter (Signed)
Rx xanax printed. Please place on my desk to be signed. Notify pt to follow up in one month. If needing the xanax on recurrent basis want him to sign controlled medicine contract and give uds when in for office visit.

## 2016-10-08 NOTE — Telephone Encounter (Signed)
Rx for [employer] with Lurena Joiner and notified her that this was done  Derek Conner further needed

## 2016-10-08 NOTE — Telephone Encounter (Signed)
Rx placed on PCP desk and pt has been scheduled for f/u on 11/08/16 at 9:15am with PCP. Did not discuss UDS / contract as that can be addressed at upcoming appt if medication is still needed on an ongoing basis.

## 2016-10-09 MED FILL — ALPRAZolam 0.5 MG TABS: 0.5 | 30 days supply | Qty: 30 | Fill #0

## 2016-10-09 NOTE — Telephone Encounter (Signed)
Rx faxed to pharmacy/SLS 03/07

## 2016-10-20 ENCOUNTER — Emergency Department (HOSPITAL_COMMUNITY)
Admission: EM | Admit: 2016-10-20 | Discharge: 2016-10-20 | Disposition: A | Discharge status: Home / Self Care | Payer: 59 | Attending: Emergency Medicine | Admitting: Emergency Medicine

## 2016-10-20 ENCOUNTER — Emergency Department (HOSPITAL_COMMUNITY): Payer: 59

## 2016-10-20 ENCOUNTER — Encounter (HOSPITAL_COMMUNITY): Payer: Self-pay | Admitting: Emergency Medicine

## 2016-10-20 DIAGNOSIS — Z7982 Long term (current) use of aspirin: Secondary | ICD-10-CM | POA: Diagnosis not present

## 2016-10-20 DIAGNOSIS — J181 Lobar pneumonia, unspecified organism: Secondary | ICD-10-CM

## 2016-10-20 DIAGNOSIS — I10 Essential (primary) hypertension: Secondary | ICD-10-CM | POA: Diagnosis not present

## 2016-10-20 DIAGNOSIS — J441 Chronic obstructive pulmonary disease with (acute) exacerbation: Secondary | ICD-10-CM | POA: Insufficient documentation

## 2016-10-20 DIAGNOSIS — Z79899 Other long term (current) drug therapy: Secondary | ICD-10-CM | POA: Insufficient documentation

## 2016-10-20 DIAGNOSIS — R9431 Abnormal electrocardiogram [ECG] [EKG]: Secondary | ICD-10-CM | POA: Diagnosis not present

## 2016-10-20 DIAGNOSIS — F17210 Nicotine dependence, cigarettes, uncomplicated: Secondary | ICD-10-CM | POA: Insufficient documentation

## 2016-10-20 DIAGNOSIS — J189 Pneumonia, unspecified organism: Secondary | ICD-10-CM | POA: Diagnosis not present

## 2016-10-20 DIAGNOSIS — R079 Chest pain, unspecified: Secondary | ICD-10-CM | POA: Diagnosis not present

## 2016-10-20 LAB — CBC
HEMATOCRIT: 40 % (ref 39.0–52.0)
HEMOGLOBIN: 13.1 g/dL (ref 13.0–17.0)
MCH: 28.1 pg (ref 26.0–34.0)
MCHC: 32.8 g/dL (ref 30.0–36.0)
MCV: 85.7 fL (ref 78.0–100.0)
Platelets: 248 10*3/uL (ref 150–400)
RBC: 4.67 MIL/uL (ref 4.22–5.81)
RDW: 14.4 % (ref 11.5–15.5)
WBC: 13.8 10*3/uL — AB (ref 4.0–10.5)

## 2016-10-20 LAB — BASIC METABOLIC PANEL
Anion gap: 9 (ref 5–15)
BUN: 15 mg/dL (ref 6–20)
CHLORIDE: 103 mmol/L (ref 101–111)
CO2: 23 mmol/L (ref 22–32)
CREATININE: 1.29 mg/dL — AB (ref 0.61–1.24)
Calcium: 9.5 mg/dL (ref 8.9–10.3)
GFR calc Af Amer: >60 mL/min (ref >60)
GFR calc non Af Amer: >60 mL/min (ref >60)
Glucose, Bld: 117 mg/dL — ABNORMAL HIGH (ref 65–99)
POTASSIUM: 4.1 mmol/L (ref 3.5–5.1)
Sodium: 135 mmol/L (ref 135–145)

## 2016-10-20 LAB — D-DIMER, QUANTITATIVE: D-Dimer, Quant: 0.35 ug/mL-FEU (ref 0.00–0.50)

## 2016-10-20 LAB — I-STAT TROPONIN, ED
TROPONIN I, POC: 0 ng/mL (ref 0.00–0.08)
Troponin i, poc: 0 ng/mL (ref 0.00–0.08)

## 2016-10-20 LAB — I-STAT CG4 LACTIC ACID, ED: LACTIC ACID, VENOUS: 1.51 mmol/L (ref 0.5–1.9)

## 2016-10-20 MED ORDER — DEXTROSE 5 % IV SOLN
1.0000 g | Freq: Once | INTRAVENOUS | Status: AC
  Administered 2016-10-20: 1 g via INTRAVENOUS
  Filled 2016-10-20: qty 10

## 2016-10-20 MED ORDER — ALBUTEROL SULFATE (2.5 MG/3ML) 0.083% IN NEBU
2.5000 mg | INHALATION_SOLUTION | Freq: Once | RESPIRATORY_TRACT | Status: AC
  Administered 2016-10-20: 2.5 mg via RESPIRATORY_TRACT
  Filled 2016-10-20: qty 3

## 2016-10-20 MED ORDER — DEXTROSE 5 % IV SOLN
500.0000 mg | Freq: Once | INTRAVENOUS | Status: AC
  Administered 2016-10-20: 500 mg via INTRAVENOUS
  Filled 2016-10-20: qty 500

## 2016-10-20 MED ORDER — MORPHINE SULFATE (PF) 4 MG/ML IV SOLN
4.0000 mg | Freq: Once | INTRAVENOUS | Status: AC
  Administered 2016-10-20: 4 mg via INTRAVENOUS
  Filled 2016-10-20: qty 1

## 2016-10-20 MED ORDER — AMOXICILLIN-POT CLAVULANATE 875-125 MG PO TABS: 1.0000 | ORAL_TABLET | Freq: Two times a day (BID) | ORAL | 0 refills | Status: DC

## 2016-10-20 MED ORDER — PREDNISONE 10 MG PO TABS: ORAL_TABLET | ORAL | 0 refills | Status: DC

## 2016-10-20 MED ORDER — SODIUM CHLORIDE 0.9 % IV BOLUS (SEPSIS)
1000.0000 mL | Freq: Once | INTRAVENOUS | Status: AC
  Administered 2016-10-20: 1000 mL via INTRAVENOUS

## 2016-10-20 MED ORDER — MORPHINE SULFATE (PF) 4 MG/ML IV SOLN: 6.0000 mg | Freq: Once | INTRAVENOUS | Status: DC

## 2016-10-20 NOTE — Discharge Instructions (Signed)
Please read and follow all provided instructions.  Your diagnoses today include:  1. Community acquired pneumonia of right upper lobe of lung (HCC)   2. COPD exacerbation (HCC)     Tests performed today include: Blood counts and electrolytes Chest x-ray -- shows pneumonia Vital signs. See below for your results today.   Medications prescribed:   Take any prescribed medications only as directed.  Please take Take prednisone 40mg  once daily x 3 days, then 30mg  once daily x 3 days, then 20mg  once daily x 3 days, then prednisone 10mg  once daily  x 3 days and stop  Home care instructions:  Follow any educational materials contained in this packet.  Take the complete course of antibiotics that you were prescribed.   BE VERY CAREFUL not to take multiple medicines containing Tylenol (also called acetaminophen). Doing so can lead to an overdose which can damage your liver and cause liver failure and possibly death.   Follow-up instructions: Please follow-up with your Pulmonologist in the next 3 days for further evaluation of your symptoms and to ensure resolution of your infection.   Return instructions:  Please return to the Emergency Department if you experience worsening symptoms.  Return immediately with worsening breathing, worsening shortness of breath, or if you feel it is taking you more effort to breathe.  Please return if you have any other emergent concerns.  Additional Information:  Your vital signs today were: BP (!) 151/89    Pulse 98    Temp 98.5 F (36.9 C) (Oral)    Resp 18    Ht 6' (1.829 m)    Wt 79.8 kg    SpO2 98%    BMI 23.87 kg/m  If your blood pressure (BP) was elevated above 135/85 this visit, please have this repeated by your doctor within one month. --------------

## 2016-10-20 NOTE — ED Triage Notes (Signed)
Pt reports for past 6 weeks being on 2 rounds of antibiotics for bronchitis. Pt reports left sided chest pain with shortness of breath onset yesterday. Pt has history of COPD.

## 2016-10-20 NOTE — ED Provider Notes (Signed)
[facility]-EM[facility] DEPT Provider Note   CSN: [id number] Arrival date & time: 10/20/16  6578     History   Chief Complaint Chief Complaint  Patient presents with  . Chest Pain    HPI Derek Conner is a 56 y.o. male.  HPI  56 y.o. male with a hx of Chronic Bronchitis, COPD, HTN, presents to the Emergency Department today complaining of chest pain as well as shortness of breath since Thursday. Associated cough without URI symptoms of sinus congestion and rhinorrhea. Notes pain is pleuritic and in right chest. Notes pain at rest as well. No N/V. No diaphoresis. No abdominal pain. Notes subjective Fever last night, but none now. Took Tylenol around 0400. Chart review shows on 09-27-16- Rxed Doxy and Prednisone after failed outpatient ABX initially for Bronchitis/COPD Exacerbation. Pt states that he improved with therapy at that time. No hx DVT/PE. No recent travel. No recent surgeries. No other symptoms noted.   Past Medical History:  Diagnosis Date  . Asthma   . CAP (community acquired pneumonia) 01/19/2016  . Chronic bronchitis (HCC)   . COPD (chronic obstructive pulmonary disease) (HCC)   . Emphysema lung (HCC)   . Emphysema of lung (HCC)   . Hepatitis A infection ~ 1980  . High cholesterol   . Hypertension   . Pneumonia 1960s  . Rheumatoid arthritis (HCC)    "hands mostly"  ( 01/19/2016)  . Situational depression Dec 05, 2009   "only when my mother died"    Patient Active Problem List   Diagnosis Date Noted  . Brain aneurysm 03/27/2016  . Preop respiratory exam 03/26/2016  . Aneurysm (HCC)   . COPD with exacerbation (HCC) 01/19/2016  . CAP (community acquired pneumonia) 01/19/2016  . Cough syncope 01/19/2016  . Wellness examination 08/29/2015  . Hyperglycemia 01/06/2015  . HTN (hypertension) 12/09/2014  . COPD exacerbation (HCC) 07/08/2014  . Atypical chest pain 07/08/2014  . 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 Dry mouth 12/26/2013  . Smoking 12/26/2013  . Nodule of right  lung 12/26/2013  . COPD, severity to be determined (HCC) 12/26/2013  . Pulmonary nodule, right 12/06/2013  . Hemoptysis 12/03/2013  . COPD (chronic obstructive pulmonary disease) (HCC) 12/03/2013  . Acute bronchitis 12/03/2013  . Tobacco use disorder 12/03/2013    Past Surgical History:  Procedure Laterality Date  . APPENDECTOMY  1980's  . CHEST TUBE INSERTION Left 1990's   "lung collapsed"  . CYSTECTOMY Left 1960's   "wrist"  . FOOT FRACTURE SURGERY Left ~ Dec 06, 2003   "it was crushed"  . IR GENERIC HISTORICAL  03/27/2016   IR ANGIOGRAM FOLLOW UP STUDY 03/27/2016 Julieanne Cotton, MD [facility]  . IR GENERIC HISTORICAL  03/27/2016   IR ANGIOGRAM FOLLOW UP STUDY 03/27/2016 Julieanne Cotton, MD [facility]  . IR GENERIC HISTORICAL  03/27/2016   IR ANGIO INTRA EXTRACRAN SEL COM CAROTID INNOMINATE UNI L MOD SED 03/27/2016 Julieanne Cotton, MD [facility]  . IR GENERIC HISTORICAL  03/27/2016   IR ANGIOGRAM FOLLOW UP STUDY 03/27/2016 Julieanne Cotton, MD [facility]  . IR GENERIC HISTORICAL  03/27/2016   IR ANGIO VERTEBRAL SEL SUBCLAVIAN INNOMINATE UNI R MOD SED 03/27/2016 Julieanne Cotton, MD [facility]  . IR GENERIC HISTORICAL  03/27/2016   IR 3D INDEPENDENT WKST 03/27/2016 Julieanne Cotton, MD [facility]  . IR GENERIC HISTORICAL  03/27/2016   IR ANGIOGRAM FOLLOW UP STUDY 03/27/2016 Julieanne Cotton, MD [facility]  . IR GENERIC HISTORICAL  03/27/2016   IR NEURO EACH ADD'L AFTER BASIC  UNI RIGHT (MS) 03/27/2016 Julieanne Cotton, MD [facility]  . IR GENERIC HISTORICAL  03/27/2016   IR ANGIOGRAM FOLLOW UP STUDY 03/27/2016 Julieanne Cotton, MD [facility]  . IR GENERIC HISTORICAL  03/27/2016   IR TRANSCATH/EMBOLIZ 03/27/2016 Julieanne Cotton, MD [facility]  . IR GENERIC HISTORICAL  03/27/2016   IR ANGIO INTRA EXTRACRAN SEL INTERNAL CAROTID UNI R MOD SED 03/27/2016 Julieanne Cotton, MD [facility]  . IR GENERIC HISTORICAL  03/27/2016   IR ANGIOGRAM FOLLOW UP STUDY 03/27/2016  Julieanne Cotton, MD [facility]  . IR GENERIC HISTORICAL  04/12/2016   IR RADIOLOGIST EVAL & MGMT 04/12/2016 [facility]  . RADIOLOGY WITH ANESTHESIA N/A 02/29/2016   Procedure: EMBOLIZATION     (RADIOLOGY WITH ANESTHESIA);  Surgeon: Julieanne Cotton, MD;  Location: [facility] OR;  Service: Radiology;  Laterality: N/A;  . RADIOLOGY WITH ANESTHESIA N/A 03/27/2016   Procedure: Embolization;  Surgeon: Julieanne Cotton, MD;  Location: [facility] OR;  Service: Radiology;  Laterality: N/A;  . VIDEO BRONCHOSCOPY Bilateral 12/06/2013   Procedure: VIDEO BRONCHOSCOPY WITHOUT FLUORO;  Surgeon: Alyson Reedy, MD;  Location: [facility] ENDOSCOPY;  Service: Cardiopulmonary;  Laterality: Bilateral;       Home Medications    Prior to Admission medications   Medication Sig Start Date End Date Taking? Authorizing Provider  albuterol (PROVENTIL HFA;VENTOLIN HFA) 108 (90 Base) MCG/ACT inhaler Inhale 2 puffs into the lungs every 6 (six) hours as needed for wheezing or shortness of breath. 01/25/16   Gwenlyn Found Copland, MD  ALPRAZolam (XANAX) 0.5 MG tablet 1 tab po q day as needed severe anxiety/panic attack. 10/08/16   Ramon Dredge Saguier, PA-C  amLODipine (NORVASC) 10 MG tablet TAKE 1 TABLET (10 MG) BY MOUTH DAILY. 01/25/16   Pearline Cables, MD  aspirin EC 81 MG tablet Take 81 mg by mouth daily.    Historical Provider, MD  atorvastatin (LIPITOR) 20 MG tablet Take 1 tablet (20 mg total) by mouth daily. 06/28/15   Ramon Dredge Saguier, PA-C  cefdinir (OMNICEF) 300 MG capsule Take 1 capsule (300 mg total) by mouth 2 (two) times daily. 09/23/16   Kalman Shan, MD  cloNIDine (CATAPRES) 0.2 MG tablet Take 1 tablet (0.2 mg total) by mouth 2 (two) times daily. 05/19/16   Vilinda Blanks Minor, NP  doxycycline (VIBRA-TABS) 100 MG tablet Take 1 tablet (100 mg total) by mouth 2 (two) times daily. 08/26/16   Leslye Peer, MD  doxycycline (VIBRA-TABS) 100 MG tablet 1 tab twice daily. Take after meals and avoid sunlight. 09/27/16   Kalman Shan, MD    famotidine (PEPCID) 20 MG tablet Take 1 tablet (20 mg total) by mouth at bedtime. 09/05/16   Ramon Dredge Saguier, PA-C  Fluticasone-Salmeterol (ADVAIR DISKUS) 500-50 MCG/DOSE AEPB Inhale 1 puff into the lungs 2 (two) times daily. 05/21/16   Praveen Mannam, MD  Fluticasone-Salmeterol (ADVAIR DISKUS) 500-50 MCG/DOSE AEPB Inhale 1 puff into the lungs 2 (two) times daily. 05/21/16 05/22/16  Praveen Mannam, MD  pantoprazole (PROTONIX) 40 MG tablet Take 1 tablet (40 mg total) by mouth daily at 12 noon. 09/05/16   Ramon Dredge Saguier, PA-C  predniSONE (DELTASONE) 10 MG tablet Take 4 tabs X 3 days, 3 tabs X 3 days, 2 tabs X 3 days, 1 tab X 3 days then stop 08/26/16   Leslye Peer, MD  predniSONE (DELTASONE) 10 MG tablet 40 mg daily x 2 days, then 20mg  daily x 2 days, then 10mg  daily x 2 days, then 5mg  daily x 2 days and stop 09/23/16  Kalman Shan, MD  predniSONE (DELTASONE) 10 MG tablet 4 tabs X 3 days, 3 tabs  X 3 days, 2 tabs X 3 days, 1 tabs X 3 days then stop 09/27/16   Kalman Shan, MD  roflumilast (DALIRESP) 500 MCG TABS tablet Take 1 tablet (500 mcg total) by mouth daily. 10/08/16   Kalman Shan, MD  ticagrelor (BRILINTA) 90 MG TABS tablet Take 45-90 mg by mouth 2 (two) times daily. 1 in the morning and 1/2 in the evening    Historical Provider, MD  tiotropium (SPIRIVA HANDIHALER) 18 MCG inhalation capsule PLACE 1 CAPSULE INTO INHALER AND INHALE INTO LUNGS DAILY. 01/25/16   Pearline Cables, MD    Family History Family History  Problem Relation Age of Onset  . Cancer Mother   . Arthritis Mother   . Stroke Father   . Hypertension Father   . Colon cancer Neg Hx     Social History Social History  Substance Use Topics  . Smoking status: Current Every Day Smoker    Packs/day: 1.00    Years: 40.00    Types: Cigarettes    Last attempt to quit: 01/19/2016  . Smokeless tobacco: Never Used     Comment: 1/2 ppd 12.20.17  . Alcohol use 6.0 oz/week    10 Cans of beer per week     Comment: 12/04/2013  "2beers/day"     Allergies   Symbicort [budesonide-formoterol fumarate] and Dulera [mometasone furo-formoterol fum]   Review of Systems Review of Systems ROS reviewed and all are negative for acute change except as noted in the HPI.  Physical Exam Updated Vital Signs BP (!) 151/89   Pulse 98   Temp 98.5 F (36.9 C) (Oral)   Resp 18   Ht 6' (1.829 m)   Wt 79.8 kg   SpO2 98%   BMI 23.87 kg/m   Physical Exam  Constitutional: He is oriented to person, place, and time. Vital signs are normal. He appears well-developed and well-nourished.  HENT:  Head: Normocephalic and atraumatic.  Right Ear: Hearing normal.  Left Ear: Hearing normal.  Eyes: Conjunctivae and EOM are normal. Pupils are equal, round, and reactive to light.  Neck: Normal range of motion. Neck supple.  Cardiovascular: Normal rate, regular rhythm, normal heart sounds and intact distal pulses.   No murmur heard. Pulmonary/Chest: Effort normal. No respiratory distress. He has wheezes in the right upper field, the right lower field, the left upper field and the left lower field. He has no rales. He exhibits no tenderness.  Musculoskeletal: Normal range of motion.  Neurological: He is alert and oriented to person, place, and time.  Skin: Skin is warm and dry.  Psychiatric: He has a normal mood and affect. His speech is normal and behavior is normal. Thought content normal.  Nursing note and vitals reviewed.  ED Treatments / Results  Labs (all labs ordered are listed, but only abnormal results are displayed) Labs Reviewed  BASIC METABOLIC PANEL - Abnormal; Notable for the following:       Result Value   Glucose, Bld 117 (*)    Creatinine, Ser 1.29 (*)    All other components within normal limits  CBC - Abnormal; Notable for the following:    WBC 13.8 (*)    All other components within normal limits  D-DIMER, QUANTITATIVE (NOT AT [facility])  Rosezena Sensor, ED  I-STAT CG4 LACTIC ACID, ED  Rosezena Sensor, ED     EKG  EKG Interpretation  Date/Time:  Sunday  October 20 2016 10:19:26 EDT Ventricular Rate:  95 PR Interval:  156 QRS Duration: 84 QT Interval:  336 QTC Calculation: 422 R Axis:   83 Text Interpretation:  Normal sinus rhythm Minimal voltage criteria for LVH, may be normal variant ST elevation, consider early repolarization, pericarditis, or injury Abnormal ECG No significant change since last tracing Although rate has increased Confirmed by RAY MD, Duwayne Heck [phone]) on 10/20/2016 11:08:51 AM       Radiology Dg Chest 2 View  Result Date: 10/20/2016 CLINICAL DATA:  56 year old male with history of chest pain. History of COPD. EXAM: CHEST  2 VIEW COMPARISON:  Chest x-ray 05/16/2016. FINDINGS: Lungs are hyperexpanded with flattening of the hemidiaphragms, pruning of the pulmonary vasculature in the periphery, increased retrosternal airspace and emphysematous changes, compatible with the reported clinical history of underlying COPD. Diffuse peribronchial cuffing. Patchy multifocal areas of architectural distortion are noted in the right lung, most evident in the right upper lobe, slightly more nodular in appearance than the prior study. No pleural effusions. No evidence of pulmonary edema. Heart size is normal. Upper mediastinal contours are within normal limits. Aortic atherosclerosis. IMPRESSION: 1. Patchy multifocal areas of architectural distortion in the right upper lobe are slightly more apparent than prior chest radiograph 05/16/2016. Based on comparison with prior chest CTs, this is favored to reflect evolving changes from chronic indolent atypical infection. The possibility of acute infection is not excluded at this time. 2. Given the findings on the prior lung cancer screening examination 03/15/2016, the patient should be followed in the near future with repeat chest CT (please DO NOT order a routine non-contrast chest CT, and instead please use the following order, "CT CHEST LCS NODULE  FOLLOW-UP W/O CM"), as the prior study indicated in need for six-month follow-up which should have been obtained in February 2018. 3. Chronic changes compatible with the reported clinical history of COPD redemonstrated, as above. 4. Aortic atherosclerosis. Electronically Signed   By: Trudie Reed M.D.   On: 10/20/2016 11:18    Procedures Procedures (including critical care time)  Medications Ordered in ED Medications - No data to display   Initial Impression / Assessment and Plan / ED Course  I have reviewed the triage vital signs and the nursing notes.  Pertinent labs & imaging results that were available during my care of the patient were reviewed by me and considered in my medical decision making (see chart for details).  Final Clinical Impressions(s) / ED Diagnoses  {I have reviewed and evaluated the relevant laboratory values. {I have reviewed and evaluated the relevant imaging studies. {I have interpreted the relevant EKG. {I have reviewed the relevant previous healthcare records.  {I obtained HPI from historian. {Patient discussed with supervising physician.  ED Course:  Assessment: Pt is a 56 y.o. male with hx Chronic Bronchitis, COPD, HTN who presents with Cough, Shortness of Breath, as well as pleuritic chest pain since last night. Notes cough x several days. Subjective fevers. On exam, pt in NAD. Nontoxic/nonseptic appearing. VSS. Afebrile. Lungs with diffuse wheeze. Heart RRR. Abdomen nontender soft. CBC with mild leukocytosis. Lactic negative. BMP unremarkable. CXR with possible right upper lobe atypical pneumonia. Due to pleuritic pain, obtained D Dimer, which was negative. Trop negative x 2. Given Rocephin and Azithro for CAP in ED. Discussed with supervising physician. Plan is to DC home with ABX therapy of Augmentin and Steroid Taper. Close follow up to Pulmonologist and strict return precautions given. At time of discharge, Patient is in no  acute distress. Vital Signs are  stable. Patient is able to ambulate. Patient able to tolerate PO.    Disposition/Plan:  Admit Additional Verbal discharge instructions given and discussed with patient.  Pt Instructed to f/u with Pulmonology in the next week for evaluation and treatment of symptoms. Return precautions given Pt acknowledges and agrees with plan  Supervising Physician Mancel Bale, MD  Final diagnoses:  Community acquired pneumonia of right upper lobe of lung (HCC)  COPD exacerbation [facility])    New Prescriptions New Prescriptions   No medications on file     Audry Pili, PA-C 10/20/16 1413    Mancel Bale, MD 10/20/16 1635

## 2016-10-22 ENCOUNTER — Other Ambulatory Visit (HOSPITAL_COMMUNITY): Payer: Self-pay | Admitting: Interventional Radiology

## 2016-10-22 DIAGNOSIS — I729 Aneurysm of unspecified site: Secondary | ICD-10-CM

## 2016-10-25 ENCOUNTER — Ambulatory Visit (INDEPENDENT_AMBULATORY_CARE_PROVIDER_SITE_OTHER): Payer: 59 | Admitting: Internal Medicine

## 2016-10-25 DIAGNOSIS — J449 Chronic obstructive pulmonary disease, unspecified: Secondary | ICD-10-CM

## 2016-10-25 DIAGNOSIS — J441 Chronic obstructive pulmonary disease with (acute) exacerbation: Secondary | ICD-10-CM

## 2016-10-25 DIAGNOSIS — F17210 Nicotine dependence, cigarettes, uncomplicated: Secondary | ICD-10-CM

## 2016-10-25 DIAGNOSIS — F17200 Nicotine dependence, unspecified, uncomplicated: Secondary | ICD-10-CM

## 2016-10-25 LAB — PULMONARY FUNCTION TEST
DL/VA % pred: 65 %
DL/VA: 3.09 ml/min/mmHg/L
DLCO COR: 24.82 ml/min/mmHg
DLCO UNC % PRED: 72 %
DLCO UNC: 24.75 ml/min/mmHg
DLCO cor % pred: 72 %
FEF 25-75 PRE: 0.58 L/s
FEF2575-%PRED-PRE: 17 %
FEV1-%PRED-PRE: 35 %
FEV1-Pre: 1.38 L
FEV1FVC-%PRED-PRE: 56 %
FEV6-%PRED-PRE: 62 %
FEV6-PRE: 3.08 L
FEV6FVC-%PRED-PRE: 98 %
FVC-%Pred-Pre: 63 %
FVC-Pre: 3.24 L
PRE FEV6/FVC RATIO: 95 %
Pre FEV1/FVC ratio: 43 %

## 2016-10-25 NOTE — Progress Notes (Signed)
PFT done today. 

## 2016-10-31 ENCOUNTER — Ambulatory Visit (HOSPITAL_COMMUNITY)
Admission: RE | Admit: 2016-10-31 | Discharge: 2016-10-31 | Disposition: A | Discharge status: Home / Self Care | Payer: 59 | Source: Ambulatory Visit | Attending: Interventional Radiology | Admitting: Interventional Radiology

## 2016-10-31 ENCOUNTER — Ambulatory Visit (HOSPITAL_COMMUNITY): Payer: 59

## 2016-10-31 ENCOUNTER — Encounter (HOSPITAL_COMMUNITY): Payer: Self-pay

## 2016-10-31 DIAGNOSIS — I639 Cerebral infarction, unspecified: Secondary | ICD-10-CM | POA: Diagnosis not present

## 2016-10-31 DIAGNOSIS — Z9889 Other specified postprocedural states: Secondary | ICD-10-CM | POA: Diagnosis not present

## 2016-10-31 DIAGNOSIS — I729 Aneurysm of unspecified site: Secondary | ICD-10-CM | POA: Insufficient documentation

## 2016-10-31 DIAGNOSIS — R9082 White matter disease, unspecified: Secondary | ICD-10-CM | POA: Insufficient documentation

## 2016-10-31 DIAGNOSIS — I671 Cerebral aneurysm, nonruptured: Secondary | ICD-10-CM | POA: Diagnosis not present

## 2016-10-31 MED ORDER — GADOBENATE DIMEGLUMINE 529 MG/ML IV SOLN
18.0000 mL | Freq: Once | INTRAVENOUS | Status: AC | PRN
Start: 2016-10-31 — End: 2016-10-31
  Administered 2016-10-31: 18 mL via INTRAVENOUS

## 2016-11-04 MED FILL — ADVAIR 500/50 DISKUS: 500-50 | 90 days supply | Qty: 180 | Fill #3

## 2016-11-04 MED FILL — VENTOLIN HFA 90 MCG INHALER: 108 (90 BAS | 90 days supply | Qty: 54 | Fill #2

## 2016-11-04 MED FILL — DALIRESP 500 MCG TABLET: 500 | 30 days supply | Qty: 30 | Fill #1

## 2016-11-05 ENCOUNTER — Telehealth (HOSPITAL_COMMUNITY): Payer: Self-pay

## 2016-11-05 NOTE — Telephone Encounter (Signed)
Pt agreed to f/u in 6 month with mri/mra. AW

## 2016-11-08 ENCOUNTER — Encounter: Payer: Self-pay | Admitting: Medical

## 2016-11-08 ENCOUNTER — Encounter: Payer: Self-pay | Admitting: Acute Care

## 2016-11-08 ENCOUNTER — Ambulatory Visit (INDEPENDENT_AMBULATORY_CARE_PROVIDER_SITE_OTHER): Payer: 59 | Admitting: Medical

## 2016-11-08 VITALS — BP 134/85 | HR 72 | Temp 97.8°F | Resp 16 | Ht 74.0 in | Wt 186.8 lb

## 2016-11-08 DIAGNOSIS — E785 Hyperlipidemia, unspecified: Secondary | ICD-10-CM | POA: Diagnosis not present

## 2016-11-08 DIAGNOSIS — F419 Anxiety disorder, unspecified: Secondary | ICD-10-CM | POA: Diagnosis not present

## 2016-11-08 DIAGNOSIS — F17200 Nicotine dependence, unspecified, uncomplicated: Secondary | ICD-10-CM | POA: Diagnosis not present

## 2016-11-08 MED ORDER — NICOTINE 14 MG/24HR TD PT24: 14.0000 mg | MEDICATED_PATCH | Freq: Every day | TRANSDERMAL | 2 refills | Status: DC

## 2016-11-08 MED FILL — NICOTINE 14 MG/24HR PATCH: 14 | 28 days supply | Qty: 28 | Fill #0

## 2016-11-08 NOTE — Progress Notes (Signed)
Subjective:    Patient ID: Derek Conner, male    DOB: 20-Aug-1960, 56 y.o.   MRN: [id number]  HPI   Pt in for follow up.  Pt states he has been anxious recently. Moderate to severe on Sunday. He not sure what made him anxious. He was cooking for a lot guest and all sudden started feeling anxious.  On Sunday lasted 6 hours then subsided. Today feels a little on edge/anxious. Pt not identifying any cause. No chest pain. No sob.  He only took 5 xanax in past month. I had written 30 tabs.  No constant low level anxiety. No depression daily.   Pt recently stated rt arm slight pain. He states he states almost like pinched nerve sensation(no neck pain with). He did some research and thought was from atrovastatin. He stopped med and 2 days later rt arm pain stopped. Pt in past had mild elevation of cholesterol he is smoker. Dad probably had stroke when he was older.   Pt is smoking half pack a day now. Was smoking full pack.(Pt in past uses patch)   Review of Systems  Constitutional: Negative for chills, fatigue and fever.  Respiratory: Negative for cough, shortness of breath and wheezing.   Cardiovascular: Negative for chest pain and palpitations.  Gastrointestinal: Negative for abdominal pain.  Musculoskeletal: Negative for back pain.  Neurological: Negative for dizziness, weakness and headaches.  Hematological: Negative for adenopathy. Does not bruise/bleed easily.  Psychiatric/Behavioral: Negative for agitation, confusion, dysphoric mood, sleep disturbance and suicidal ideas. The patient is nervous/anxious.        Rare moderate to severe anxiety.   Past Medical History:  Diagnosis Date  . Asthma   . CAP (community acquired pneumonia) 01/19/2016  . Chronic bronchitis (HCC)   . COPD (chronic obstructive pulmonary disease) (HCC)   . Emphysema lung (HCC)   . Emphysema of lung (HCC)   . Hepatitis A infection ~ 1980  . High cholesterol   . Hypertension   . Pneumonia 1960s  .  Rheumatoid arthritis (HCC)    "hands mostly"  ( 01/19/2016)  . Situational depression 12-15-2009   "only when my mother died"     Social History   Social History  . Marital status: Married    Spouse name: N/A  . Number of children: N/A  . Years of education: N/A   Occupational History  . Not on file.   Social History Main Topics  . Smoking status: Current Every Day Smoker    Packs/day: 1.00    Years: 40.00    Types: Cigarettes    Last attempt to quit: 01/19/2016  . Smokeless tobacco: Never Used     Comment: 1/2 ppd 12.20.17  . Alcohol use 6.0 oz/week    10 Cans of beer per week     Comment: 12/04/2013 "2beers/day"  . Drug use: No     Comment: 01/19/2016 "nothing in the last 3 months"  . Sexual activity: Yes   Other Topics Concern  . Not on file   Social History Narrative  . No narrative on file    Past Surgical History:  Procedure Laterality Date  . APPENDECTOMY  1980's  . CHEST TUBE INSERTION Left 1990's   "lung collapsed"  . CYSTECTOMY Left 1960's   "wrist"  . FOOT FRACTURE SURGERY Left ~ Dec 16, 2003   "it was crushed"  . IR GENERIC HISTORICAL  03/27/2016   IR ANGIOGRAM FOLLOW UP STUDY 03/27/2016 Julieanne Cotton, MD [facility]  . IR GENERIC  HISTORICAL  03/27/2016   IR ANGIOGRAM FOLLOW UP STUDY 03/27/2016 Julieanne Cotton, MD [facility]  . IR GENERIC HISTORICAL  03/27/2016   IR ANGIO INTRA EXTRACRAN SEL COM CAROTID INNOMINATE UNI L MOD SED 03/27/2016 Julieanne Cotton, MD [facility]  . IR GENERIC HISTORICAL  03/27/2016   IR ANGIOGRAM FOLLOW UP STUDY 03/27/2016 Julieanne Cotton, MD [facility]  . IR GENERIC HISTORICAL  03/27/2016   IR ANGIO VERTEBRAL SEL SUBCLAVIAN INNOMINATE UNI R MOD SED 03/27/2016 Julieanne Cotton, MD [facility]  . IR GENERIC HISTORICAL  03/27/2016   IR 3D INDEPENDENT WKST 03/27/2016 Julieanne Cotton, MD [facility]  . IR GENERIC HISTORICAL  03/27/2016   IR ANGIOGRAM FOLLOW UP STUDY 03/27/2016 Julieanne Cotton, MD [facility]  . IR GENERIC  HISTORICAL  03/27/2016   IR NEURO EACH ADD'L AFTER BASIC UNI RIGHT (MS) 03/27/2016 Julieanne Cotton, MD [facility]  . IR GENERIC HISTORICAL  03/27/2016   IR ANGIOGRAM FOLLOW UP STUDY 03/27/2016 Julieanne Cotton, MD [facility]  . IR GENERIC HISTORICAL  03/27/2016   IR TRANSCATH/EMBOLIZ 03/27/2016 Julieanne Cotton, MD [facility]  . IR GENERIC HISTORICAL  03/27/2016   IR ANGIO INTRA EXTRACRAN SEL INTERNAL CAROTID UNI R MOD SED 03/27/2016 Julieanne Cotton, MD [facility]  . IR GENERIC HISTORICAL  03/27/2016   IR ANGIOGRAM FOLLOW UP STUDY 03/27/2016 Julieanne Cotton, MD [facility]  . IR GENERIC HISTORICAL  04/12/2016   IR RADIOLOGIST EVAL & MGMT 04/12/2016 [facility]  . RADIOLOGY WITH ANESTHESIA N/A 02/29/2016   Procedure: EMBOLIZATION     (RADIOLOGY WITH ANESTHESIA);  Surgeon: Julieanne Cotton, MD;  Location: [facility] OR;  Service: Radiology;  Laterality: N/A;  . RADIOLOGY WITH ANESTHESIA N/A 03/27/2016   Procedure: Embolization;  Surgeon: Julieanne Cotton, MD;  Location: [facility] OR;  Service: Radiology;  Laterality: N/A;  . VIDEO BRONCHOSCOPY Bilateral 12/06/2013   Procedure: VIDEO BRONCHOSCOPY WITHOUT FLUORO;  Surgeon: Alyson Reedy, MD;  Location: [facility] ENDOSCOPY;  Service: Cardiopulmonary;  Laterality: Bilateral;    Family History  Problem Relation Age of Onset  . Cancer Mother   . Arthritis Mother   . Stroke Father   . Hypertension Father   . Colon cancer Neg Hx     Allergies  Allergen Reactions  . Symbicort [Budesonide-Formoterol Fumarate] Shortness Of Breath, Nausea And Vomiting and Other (See Comments)    heachache  . Dulera [Mometasone Furo-Formoterol Fum] Other (See Comments)    Headache     Current Outpatient Prescriptions on File Prior to Visit  Medication Sig Dispense Refill  . albuterol (PROVENTIL HFA;VENTOLIN HFA) 108 (90 Base) MCG/ACT inhaler Inhale 2 puffs into the lungs every 6 (six) hours as needed for wheezing or shortness of breath. 3 Inhaler 3  . ALPRAZolam (XANAX)  0.5 MG tablet 1 tab po q day as needed severe anxiety/panic attack. 30 tablet 0  . amLODipine (NORVASC) 10 MG tablet TAKE 1 TABLET (10 MG) BY MOUTH DAILY. 90 tablet 3  . amoxicillin-clavulanate (AUGMENTIN) 875-125 MG tablet Take 1 tablet by mouth every 12 (twelve) hours. 14 tablet 0  . aspirin EC 81 MG tablet Take 81 mg by mouth daily.    . cefdinir (OMNICEF) 300 MG capsule Take 1 capsule (300 mg total) by mouth 2 (two) times daily. 10 capsule 0  . cloNIDine (CATAPRES) 0.2 MG tablet Take 1 tablet (0.2 mg total) by mouth 2 (two) times daily. 60 tablet 11  . famotidine (PEPCID) 20 MG tablet Take 1 tablet (20 mg total) by mouth at bedtime.  90 tablet 1  . Fluticasone-Salmeterol (ADVAIR DISKUS) 500-50 MCG/DOSE AEPB Inhale 1 puff into the lungs 2 (two) times daily. 60 each 2  . pantoprazole (PROTONIX) 40 MG tablet Take 1 tablet (40 mg total) by mouth daily at 12 noon. 30 tablet 3  . roflumilast (DALIRESP) 500 MCG TABS tablet Take 1 tablet (500 mcg total) by mouth daily. 30 tablet 3  . ticagrelor (BRILINTA) 90 MG TABS tablet Take 45-90 mg by mouth 2 (two) times daily. 1 in the morning and 1/2 in the evening    . tiotropium (SPIRIVA HANDIHALER) 18 MCG inhalation capsule PLACE 1 CAPSULE INTO INHALER AND INHALE INTO LUNGS DAILY. 90 capsule 3  . atorvastatin (LIPITOR) 20 MG tablet Take 1 tablet (20 mg total) by mouth daily. (Patient not taking: Reported on 11/08/2016) 30 tablet 2  . Fluticasone-Salmeterol (ADVAIR DISKUS) 500-50 MCG/DOSE AEPB Inhale 1 puff into the lungs 2 (two) times daily. 2 each 0   Current Facility-Administered Medications on File Prior to Visit  Medication Dose Route Frequency Provider Last Rate Last Dose  . 0.9 %  sodium chloride infusion   Intravenous Continuous Ralene Muskrat, PA-C      . aspirin EC tablet 325 mg  325 mg Oral 60 min Pre-Op Ralene Muskrat, PA-C      . niMODipine (NIMOTOP) capsule 0-60 mg  0-60 mg Oral 60 min Pre-Op Ralene Muskrat, PA-C        BP 134/85 (BP Location: Left  Arm, Patient Position: Sitting, Cuff Size: Normal)   Pulse 72   Temp 97.8 F (36.6 C) (Oral)   Resp 16   Ht 6\' 2"  (1.88 m)   Wt 186 lb 12.8 oz (84.7 kg)   SpO2 99%   BMI 23.98 kg/m       Objective:   Physical Exam  General Mental Status- Alert. General Appearance- Not in acute distress.   Skin General: Color- Normal Color. Moisture- Normal Moisture.  Neck Carotid Arteries- Normal color. Moisture- Normal Moisture. No carotid bruits. No JVD.  Chest and Lung Exam Auscultation: Breath Sounds:-Normal.  Cardiovascular Auscultation:Rythm- Regular. Murmurs & Other Heart Sounds:Auscultation of the heart reveals- No Murmurs.  Abdomen Inspection:-Inspeection Normal. Palpation/Percussion:Note:No mass. Palpation and Percussion of the abdomen reveal- Non Tender, Non Distended + BS, no rebound or guarding.    Neurologic Cranial Nerve exam:- CN III-XII intact(No nystagmus), symmetric smile. Strength:- 5/5 equal and symmetric strength both upper and lower extremities.      Assessment & Plan:  Good job on decreasing your smoking. Try to taper off completely. I am refilling your nicotine patches.  For anxiety continue rare sparing of xanax. If anxiety get more daily then recommmend adding ssri type med such as sertraline.  For your high cholesterol recommend low cholesterol diet. You are currently off atorvastatin due to perceived side effect. Prior number on lipid panel not that high but with hx of smoking atherosclerosis can occur despite not so bad numbers. I want to recheck your lipid panel and cmp in 1 month. If numbers increasing and if still smoking might consider putting you back on statin and watching for recurrent possible atypical side effect.  Follow up to be determined after future lab review.

## 2016-11-08 NOTE — Progress Notes (Signed)
Pre visit review using our clinic review tool, if applicable. No additional management support is needed unless otherwise documented below in the visit note. 

## 2016-11-08 NOTE — Patient Instructions (Signed)
Good job on decreasing your smoking. Try to taper off completely. I am refilling your nicotine patches.  For anxiety continue rare sparing [place] of xanax. If anxiety get more daily then recommmend adding ssri type med such as sertraline.  For your high cholesterol recommend low cholesterol diet. You are currently off atorvastatin due to perceived side effect. Prior number on lipid panel not that high but with hx of smoking atherosclerosis can occur despite not so bad numbers. I want to recheck your lipid panel and cmp in 1 month. If numbers increasing and if still smoking might consider putting you back on statin and watching for recurrent possible atypical side effect.  Follow up to be determined after future lab review.

## 2016-11-14 ENCOUNTER — Ambulatory Visit: Payer: 59 | Admitting: Medical

## 2016-11-15 ENCOUNTER — Encounter: Payer: Self-pay | Admitting: Internal Medicine

## 2016-11-15 ENCOUNTER — Ambulatory Visit (INDEPENDENT_AMBULATORY_CARE_PROVIDER_SITE_OTHER): Payer: 59 | Admitting: Internal Medicine

## 2016-11-15 VITALS — BP 120/78 | HR 85 | Temp 98.1°F | Ht 72.0 in | Wt 184.0 lb

## 2016-11-15 DIAGNOSIS — F17210 Nicotine dependence, cigarettes, uncomplicated: Secondary | ICD-10-CM | POA: Diagnosis not present

## 2016-11-15 DIAGNOSIS — J441 Chronic obstructive pulmonary disease with (acute) exacerbation: Secondary | ICD-10-CM

## 2016-11-15 MED ORDER — PREDNISONE 10 MG PO TABS: ORAL_TABLET | ORAL | 0 refills | Status: DC

## 2016-11-15 MED ORDER — AMOXICILLIN-POT CLAVULANATE 875-125 MG PO TABS: 1.0000 | ORAL_TABLET | Freq: Two times a day (BID) | ORAL | 0 refills | Status: AC

## 2016-11-15 MED ORDER — ALBUTEROL SULFATE HFA 108 (90 BASE) MCG/ACT IN AERS: 2.0000 | INHALATION_SPRAY | RESPIRATORY_TRACT | Status: DC | PRN

## 2016-11-15 MED FILL — predniSONE 10 MG TABS: 10 | 6 days supply | Qty: 14 | Fill #0

## 2016-11-15 MED FILL — AMOX-CLAV 875-125 MG TABLET: 875-125 | 10 days supply | Qty: 20 | Fill #0

## 2016-11-15 NOTE — Patient Instructions (Addendum)
Augmentin 875 mg take one pill twice daily  X 10 days - take at breakfast and supper with large glass of water.  It would help reduce the usual side effects (diarrhea and yeast infections) if you ate cultured yogurt at lunch.   Prednisone 10 mg take  4 each am x 2 days,   2 each am x 2 days,  1 each am x 2 days and stop   The key is to stop smoking completely before smoking completely stops you!    Call before CT of chest to consider CT sinus add on    Keep follow up appt with Dr Monica Becton

## 2016-11-15 NOTE — Progress Notes (Signed)
Subjective:     Patient ID: Derek Conner, male   DOB: 07/24/1961,    MRN: [id number]  HPI  56 yo Native American/ smoker GOLD III copd as of 10/25/16 pfts on a typical day p taking spiriva/daliresp/advair 500    11/15/2016 acute extended ov/Derek Conner re: aecopd / active smoker Chief Complaint  Patient presents with  . Acute Visit    Pt c/o sinus pressure, HA, wheezing, increased SOB and cough for the past 4 days. Cough is prod with moderate amounts of green to yellow sputum. He is using his albuterol inhaler 3-4 x per day.   onset 11/12/16 flared on maint rx =  spiriva/daliresp/advair 500  plus on alb early p afternoon typically needs at least once daily    recently completed taper off pred / using saba now up to 4 x daily and noct but typically comfortable at rest p saba rx  Says can't take dulera or symbicort  Cough is worse at hs and in am and thinks this triggered the sob/ wheeze with lost of nasal congestion/obst symptoms  No obvious patterns in day to day or daytime variability or assoc mucus plugs or hemoptysis or cp or chest tightness, subjective wheeze or overt   hb symptoms. No unusual exp hx or h/o childhood pna/ asthma or knowledge of premature birth.   Also denies any obvious fluctuation of symptoms with weather or environmental changes or other aggravating or alleviating factors except as outlined above   Current Medications, Allergies, Complete Past Medical History, Past Surgical History, Family History, and Social History were reviewed in [employer] record.  ROS  The following are not active complaints unless bolded sore throat, dysphagia, dental problems, itching, sneezing,  nasal congestion or excess/ purulent secretions, ear ache,   fever, chills, sweats, unintended wt loss, classically pleuritic or exertional cp,  orthopnea pnd or leg swelling, presyncope, palpitations, abdominal pain, anorexia, nausea, vomiting, diarrhea  or change in bowel or  bladder habits, change in stools or urine, dysuria,hematuria,  rash, arthralgias, visual complaints, headache, numbness, weakness or ataxia or problems with walking or coordination,  change in mood/affect or memory.              Review of Systems     Objective:   Physical Exam    amb native [place] male nad/ congested cough / mild increased wob at rest with acc muscle use   Wt Readings from Last 3 Encounters:  11/15/16 184 lb (83.5 kg)  11/08/16 186 lb 12.8 oz (84.7 kg)  10/20/16 176 lb (79.8 kg)    Vital signs reviewed - Note on arrival 02 sats  99% on RA    HEENT:  Severe swelling of nasal  turbinates bilaterally, and nl oropharynx. Nl external ear canals without cough reflex - edentulous    NECK :  without JVD/Nodes/TM/ nl carotid upstrokes bilaterally   LUNGS: no acc muscle use,  Barrel  contour chest with distant bs bilaterally  s wheeze   CV:  RRR  no s3 or murmur or increase in P2, and no edema   ABD:  soft and nontender with nl inspiratory excursion in the supine position. No bruits or organomegaly appreciated, bowel sounds nl  MS:  Nl gait/ ext warm without deformities, calf tenderness, cyanosis or clubbing No obvious joint restrictions   SKIN: warm and dry without lesions    NEURO:  alert, approp, nl sensorium with  no motor or cerebellar deficits apparent.     Assessment:

## 2016-11-17 ENCOUNTER — Encounter: Payer: Self-pay | Admitting: Internal Medicine

## 2016-11-17 NOTE — Assessment & Plan Note (Signed)

## 2016-11-17 NOTE — Assessment & Plan Note (Addendum)
PFT's  10/25/2016  FEV1 1.38 (35 % ) ratio 43  p ?  prior to study with DLCO  72 % corrects to 65  % for alv volume   With marked air trapping on lung volumes  - 11/15/2016  After extensive coaching HFA effectiveness =    90%   Very poorly controlled copd historically now with another exac. DDX of  difficult airways management almost all start with A and  include Adherence, Ace Inhibitors, Acid Reflux, Active Sinus Disease, Alpha 1 Antitripsin deficiency, Anxiety masquerading as Airways dz,  ABPA,  Allergy(esp in young), Aspiration (esp in elderly), Adverse effects of meds,  Active smokers, A bunch of PE's (a small clot burden can't cause this syndrome unless there is already severe underlying pulm or vascular dz with poor reserve) plus two Bs  = Bronchiectasis and Beta blocker use..and one C= CHF  Adherence is always the initial "prime suspect" and is a multilayered concern that requires a "trust but verify" approach in every patient - starting with knowing how to use medications, especially inhalers, correctly, keeping up with refills and understanding the fundamental difference between maintenance and prns vs those medications only taken for a very short course and then stopped and not refilled.  - hfa teaching as above  Active smoking greatest issue (see separate a/p)   ? Active sinus dz > augmentin then sinus CT if not better/ advisedd  ? Allergy > doubt > Prednisone 10 mg take  4 each am x 2 days,   2 each am x 2 days,  1 each am x 2 days and stop   ? Adverse effects of dpi > refuses hfa symb/dulera due to "allergy " > ? Should we try advair hfa?   ? Alpha one AT def >  MM documented  03/26/16   ? Anxiety > usually at the bottom of this list of usual suspects but should be much higher on this pt's based on H and P and note already on psychotropics and anxiety / depression may be having a role in his inability to quit smoking.  Follow up per Primary Care planned     I had an extended  discussion with the patient reviewing all relevant studies completed to date and  lasting 25 minutes of a 40  minute acute office visit with pt not previously known to me with  Severe and  very serious refractory respiratory symptoms .  Each maintenance medication was reviewed in detail including most importantly the difference between maintenance and prns and under what circumstances the prns are to be triggered using an action plan format that is not reflected in the computer generated alphabetically organized AVS.    Please see AVS for specific instructions unique to this office visit that I personally wrote and verbalized to the the pt in detail and then reviewed with pt  by my nurse highlighting any changes in therapy/plan of care  recommended at today's visit.

## 2016-11-18 MED FILL — BRILINTA 90 MG TABLET: 90 | 30 days supply | Qty: 60 | Fill #2

## 2016-11-22 ENCOUNTER — Ambulatory Visit (HOSPITAL_BASED_OUTPATIENT_CLINIC_OR_DEPARTMENT_OTHER)
Admission: RE | Admit: 2016-11-22 | Discharge: 2016-11-22 | Disposition: A | Discharge status: Home / Self Care | Payer: 59 | Source: Ambulatory Visit | Attending: Acute Care | Admitting: Acute Care

## 2016-11-22 DIAGNOSIS — I700 Atherosclerosis of aorta: Secondary | ICD-10-CM | POA: Diagnosis not present

## 2016-11-22 DIAGNOSIS — J439 Emphysema, unspecified: Secondary | ICD-10-CM | POA: Insufficient documentation

## 2016-11-22 DIAGNOSIS — R918 Other nonspecific abnormal finding of lung field: Secondary | ICD-10-CM | POA: Diagnosis not present

## 2016-11-22 DIAGNOSIS — F17210 Nicotine dependence, cigarettes, uncomplicated: Secondary | ICD-10-CM | POA: Insufficient documentation

## 2016-11-22 DIAGNOSIS — Z122 Encounter for screening for malignant neoplasm of respiratory organs: Secondary | ICD-10-CM | POA: Diagnosis not present

## 2016-11-26 MED FILL — AMLODIPINE BESYLATE 10 MG T: 10 | 90 days supply | Qty: 90 | Fill #2

## 2016-11-29 ENCOUNTER — Other Ambulatory Visit: Payer: Self-pay | Admitting: Acute Care

## 2016-11-29 DIAGNOSIS — F17210 Nicotine dependence, cigarettes, uncomplicated: Secondary | ICD-10-CM

## 2016-12-03 MED FILL — DALIRESP 500 MCG TABLET: 500 | 30 days supply | Qty: 30 | Fill #2

## 2016-12-05 ENCOUNTER — Telehealth: Payer: Self-pay | Admitting: Medical

## 2016-12-05 NOTE — Telephone Encounter (Signed)
I reviewed pt ct of chest results. Recommended to repeat in 3 months. Just want to make sure pt is aware and the study already ordered by Kandice Robinsons NP.

## 2016-12-10 ENCOUNTER — Telehealth: Payer: Self-pay | Admitting: Medical

## 2016-12-10 ENCOUNTER — Encounter: Payer: Self-pay | Admitting: Medical

## 2016-12-10 ENCOUNTER — Other Ambulatory Visit (INDEPENDENT_AMBULATORY_CARE_PROVIDER_SITE_OTHER): Payer: 59

## 2016-12-10 DIAGNOSIS — E785 Hyperlipidemia, unspecified: Secondary | ICD-10-CM | POA: Diagnosis not present

## 2016-12-10 LAB — COMPREHENSIVE METABOLIC PANEL
ALT: 22 U/L (ref 0–53)
AST: 18 U/L (ref 0–37)
Albumin: 4.2 g/dL (ref 3.5–5.2)
Alkaline Phosphatase: 75 U/L (ref 39–117)
BUN: 15 mg/dL (ref 6–23)
CALCIUM: 9.7 mg/dL (ref 8.4–10.5)
CHLORIDE: 107 meq/L (ref 96–112)
CO2: 26 mEq/L (ref 19–32)
Creatinine, Ser: 1.19 mg/dL (ref 0.40–1.50)
GFR: 67.11 mL/min (ref >60.00)
Glucose, Bld: 101 mg/dL — ABNORMAL HIGH (ref 70–99)
Potassium: 3.9 mEq/L (ref 3.5–5.1)
Sodium: 139 mEq/L (ref 135–145)
Total Bilirubin: 0.3 mg/dL (ref 0.2–1.2)
Total Protein: 6.9 g/dL (ref 6.0–8.3)

## 2016-12-10 LAB — LIPID PANEL
CHOL/HDL RATIO: 4
Cholesterol: 189 mg/dL (ref 0–200)
HDL: 53.5 mg/dL (ref >39.00)
NONHDL: 135.78
TRIGLYCERIDES: 219 mg/dL — AB (ref 0.0–149.0)
VLDL: 43.8 mg/dL — ABNORMAL HIGH (ref 0.0–40.0)

## 2016-12-10 LAB — LDL CHOLESTEROL, DIRECT: Direct LDL: 106 mg/dL

## 2016-12-10 MED ORDER — FENOFIBRATE 48 MG PO TABS: 48.0000 mg | ORAL_TABLET | Freq: Every day | ORAL | 3 refills | Status: DC

## 2016-12-10 NOTE — Telephone Encounter (Signed)
rx fenofibrate sent to pharmacy. 

## 2016-12-11 MED FILL — FAMOTIDINE 20 MG TABLET: 20 | 90 days supply | Qty: 90 | Fill #1

## 2016-12-11 MED FILL — FENOFIBRATE 48 MG TABLET: 48 | 30 days supply | Qty: 30 | Fill #0

## 2016-12-12 ENCOUNTER — Ambulatory Visit (INDEPENDENT_AMBULATORY_CARE_PROVIDER_SITE_OTHER): Payer: 59 | Admitting: Acute Care

## 2016-12-12 ENCOUNTER — Ambulatory Visit (INDEPENDENT_AMBULATORY_CARE_PROVIDER_SITE_OTHER)
Admission: RE | Admit: 2016-12-12 | Discharge: 2016-12-12 | Disposition: A | Discharge status: Home / Self Care | Payer: 59 | Source: Ambulatory Visit | Attending: Acute Care | Admitting: Acute Care

## 2016-12-12 ENCOUNTER — Encounter: Payer: Self-pay | Admitting: Acute Care

## 2016-12-12 VITALS — BP 132/80 | HR 77 | Ht 72.0 in | Wt 182.0 lb

## 2016-12-12 DIAGNOSIS — F17200 Nicotine dependence, unspecified, uncomplicated: Secondary | ICD-10-CM

## 2016-12-12 DIAGNOSIS — R911 Solitary pulmonary nodule: Secondary | ICD-10-CM

## 2016-12-12 DIAGNOSIS — J441 Chronic obstructive pulmonary disease with (acute) exacerbation: Secondary | ICD-10-CM | POA: Diagnosis not present

## 2016-12-12 DIAGNOSIS — R0602 Shortness of breath: Secondary | ICD-10-CM | POA: Diagnosis not present

## 2016-12-12 DIAGNOSIS — B370 Candidal stomatitis: Secondary | ICD-10-CM

## 2016-12-12 MED ORDER — PREDNISONE 10 MG PO TABS: ORAL_TABLET | ORAL | 0 refills | Status: DC

## 2016-12-12 MED ORDER — DOXYCYCLINE HYCLATE 100 MG PO TABS: 100.0000 mg | ORAL_TABLET | Freq: Two times a day (BID) | ORAL | 0 refills | Status: DC

## 2016-12-12 MED ORDER — FIRST-DUKES MOUTHWASH MT SUSP: 5.0000 mL | Freq: Three times a day (TID) | OROMUCOSAL | 0 refills | Status: DC

## 2016-12-12 MED FILL — predniSONE 10 MG TABS: 10 | 8 days supply | Qty: 20 | Fill #0

## 2016-12-12 MED FILL — MAGIC MOUTHWASH BOP FORM: 16 days supply | Qty: 240 | Fill #0

## 2016-12-12 MED FILL — DOXYCYCLINE HYCLATE 100 MG: 100 | 7 days supply | Qty: 14 | Fill #0

## 2016-12-12 NOTE — Assessment & Plan Note (Signed)
Oral thrush Plan: Magic Mouthwash 5 cc TID x 7 days Rinse mouth after each  use inhalers

## 2016-12-12 NOTE — Assessment & Plan Note (Addendum)
Will need follow up CT through screening program 02/2017

## 2016-12-12 NOTE — Assessment & Plan Note (Signed)
COPD Exacerbation within 1 month of previous exacerbation Per patient this is chest symptoms vs. sinus symptoms he had last month Plan:  CXR today Doxycycline 100 mg twice daily x 7 days. Prednisone taper; 10 mg tablets: 4 tabs x 2 days, 3 tabs x 2 days, 2 tabs x 2 days 1 tab x 2 days then stop. Continue using inhalers as you have been doing. Remember to rinse mouth after using all inhalers Mucinex 1200 mg once daily with full glass of water. Magic Mouthwash 1 teaspoon 3 times daily x 7 days. Swish and swallow. Please stop smoking. Follow up with Maralyn Sago  NP in 4 weeks . Please contact office for sooner follow up if symptoms do not improve or worsen or seek emergency care

## 2016-12-12 NOTE — Assessment & Plan Note (Signed)
Continues to smoke 10 cigarettes daily Recurrent COPD exacerbations and Lung RADS 4 A nodule Plan: I have spent 4 minutes counseling patient on smoking cessation this visit. Explained the risks of continued smoking and that treatment for exacerbations are less effective with continued tobacco abuse.

## 2016-12-12 NOTE — Progress Notes (Signed)
History of Present Illness Derek Conner is a 56 y.o. male current every day smoker with COPD. He is followed by Dr. Marchelle Gearing.   12/12/2016 Acute OV: Pt presents today with Shortness of breath, productive cough with yellow mucus, nasal drainage which is  clear.He was last seen in th office 11/15/2016 by Dr. Sherene Sires for a COPD exacerbation. He was treated at that time with prednisone taper and Augmentin. He states he completed this treatment and then within a week had worsening symptoms. These symptoms are more chest related, and he felt the previous bout was due to sinus issues. Pt. States he has been wheezing. He denies fever, chest pain, orthopnea or hemoptysis.He is compliant with his Spiriva, Daliresp and Advair daily. He has been using his rescue inhaler twice daily.  Test Results: 11/22/2016:Low Dose Lung Cancer Screening IMPRESSION: 1. Lung-RADS Category 4A, suspicious. Follow up low-dose chest CT without contrast in 3 months (please use the following order, "CT CHEST LCS NODULE FOLLOW-UP W/O CM") is recommended. Alternatively, PET may be considered when there is a solid component 19mm or larger. 2. Aortic Atherosclerosis (ICD10-I70.0) and Emphysema (ICD10-J43.9).  PFT's  10/25/2016  FEV1 1.38 (35 % ) ratio 43  p ?  prior to study with DLCO  72 % corrects to 65  % for alv volume   With marked air trapping on lung volumes  - 11/15/2016  After extensive coaching HFA effectiveness =    90%   CBC Latest Ref Rng & Units 10/20/2016 09/13/2016 05/18/2016  WBC 4.0 - 10.5 K/uL 13.8(H) 11.6(H) 14.4(H)  Hemoglobin 13.0 - 17.0 g/dL 99.3 71.6 12.4(L)  Hematocrit 39.0 - 52.0 % 40.0 43.0 37.7(L)  Platelets 150 - 400 K/uL 248 354.0 322    BMP Latest Ref Rng & Units 12/10/2016 10/20/2016 09/13/2016  Glucose 70 - 99 mg/dL 967(E) 938(B) 017(P)  BUN 6 - 23 mg/dL 15 15 18   Creatinine 0.40 - 1.50 mg/dL 1.02) 5.85(I  Sodium 135 - 145 mEq/L 139 135 139  Potassium 3.5 - 5.1 mEq/L 3.9 4.1 4.6  Chloride 96  - 112 mEq/L 107 103 105  CO2 19 - 32 mEq/L 26 23 26   Calcium 8.4 - 10.5 mg/dL 9.7 9.5 9.7    BNP    Component Value Date/Time   BNP 17.9 05/16/2016 1523    PFT    Component Value Date/Time   FEV1PRE 1.38 10/25/2016 1028   FEV1POST 1.10 02/15/2014 1202   FVCPRE 3.24 10/25/2016 1028   FVCPOST 3.10 02/15/2014 1202   TLC 9.36 02/15/2014 1202   DLCOUNC 24.75 10/25/2016 1028   PREFEV1FVCRT 43 10/25/2016 1028   PSTFEV1FVCRT 36 02/15/2014 1202    Ct Chest Lcs Nodule F/u W/o Contrast  Result Date: 11/22/2016 CLINICAL DATA:  Lung cancer screening. Asymptomatic smoker. Twenty-three pack-year history. EXAM: CT CHEST WITHOUT CONTRAST FOR LUNG CANCER SCREENING NODULE FOLLOW-UP TECHNIQUE: Multidetector CT imaging of the chest was performed following the standard protocol without IV contrast. COMPARISON:  03/15/2016 FINDINGS: Cardiovascular: The heart size appears normal. Aortic atherosclerosis identified. No pericardial effusion. Mediastinum/Nodes: The trachea appears patent and is midline. Normal appearance of the esophagus. No mediastinal or hilar adenopathy identified. No axillary adenopathy. Lungs/Pleura: No pleural effusion. Moderate changes of centrilobular emphysema noted. Diffuse bronchial wall thickening identified. Chronic scarring and architectural distortion within the posterior right upper lobe is again identified and appears similar to previous exam. Multiple pulmonary nodular densities are again identified in both lungs. Within the left upper lobe there is an enlarging nodule  which has an equivalent diameter of 7.8 mm, image number 92 of series 4. On the previous exam this nodule was present with an equivalent diameter of 2.2 mm. Upper Abdomen: No acute abnormality. Musculoskeletal: No aggressive lytic or sclerotic bone lesions. IMPRESSION: 1. Lung-RADS Category 4A, suspicious. Follow up low-dose chest CT without contrast in 3 months (please use the following order, "CT CHEST LCS NODULE  FOLLOW-UP W/O CM") is recommended. Alternatively, PET may be considered when there is a solid component 20mm or larger. 2. Aortic Atherosclerosis (ICD10-I70.0) and Emphysema (ICD10-J43.9). Electronically Signed   By: Signa Kell M.D.   On: 11/22/2016 12:03     Past medical hx Past Medical History:  Diagnosis Date  . Asthma   . CAP (community acquired pneumonia) 01/19/2016  . Chronic bronchitis (HCC)   . COPD (chronic obstructive pulmonary disease) (HCC)   . Emphysema lung (HCC)   . Emphysema of lung (HCC)   . Hepatitis A infection ~ 1980  . High cholesterol   . Hypertension   . Pneumonia 1960s  . Rheumatoid arthritis (HCC)    "hands mostly"  ( 01/19/2016)  . Situational depression 11-23-2009   "only when my mother died"     Social History  Substance Use Topics  . Smoking status: Current Every Day Smoker    Packs/day: 1.00    Years: 40.00    Types: Cigarettes  . Smokeless tobacco: Never Used     Comment: down to 0.5ppd 12/12/16  . Alcohol use 6.0 oz/week    10 Cans of beer per week     Comment: 12/04/2013 "2beers/day"    Tobacco Cessation: Pt. Is a current every day smoker. He states he smokes 10 cigarettes daily.  I have spent 4 minutes counseling patient on smoking cessation and risks of continued tobacco abuse this visit.  Past surgical hx, Family hx, Social hx all reviewed.  Current Outpatient Prescriptions on File Prior to Visit  Medication Sig  . albuterol (PROVENTIL HFA;VENTOLIN HFA) 108 (90 Base) MCG/ACT inhaler Inhale 2 puffs into the lungs every 4 (four) hours as needed for wheezing or shortness of breath.  . ALPRAZolam (XANAX) 0.5 MG tablet 1 tab po q day as needed severe anxiety/panic attack.  Marland Kitchen amLODipine (NORVASC) 10 MG tablet TAKE 1 TABLET (10 MG) BY MOUTH DAILY.  Marland Kitchen aspirin EC 81 MG tablet Take 81 mg by mouth daily.  . cloNIDine (CATAPRES) 0.2 MG tablet Take 1 tablet (0.2 mg total) by mouth 2 (two) times daily.  . famotidine (PEPCID) 20 MG tablet Take 1 tablet  (20 mg total) by mouth at bedtime.  . fenofibrate (TRICOR) 48 MG tablet Take 1 tablet (48 mg total) by mouth daily.  . Fluticasone-Salmeterol (ADVAIR DISKUS) 500-50 MCG/DOSE AEPB Inhale 1 puff into the lungs 2 (two) times daily.  . nicotine (NICODERM CQ - DOSED IN MG/24 HOURS) 14 mg/24hr patch Place 1 patch (14 mg total) onto the skin daily.  . pantoprazole (PROTONIX) 40 MG tablet Take 1 tablet (40 mg total) by mouth daily at 12 noon.  . roflumilast (DALIRESP) 500 MCG TABS tablet Take 1 tablet (500 mcg total) by mouth daily.  . ticagrelor (BRILINTA) 90 MG TABS tablet Take 45-90 mg by mouth 2 (two) times daily. 1 in the morning and 1/2 in the evening  . tiotropium (SPIRIVA HANDIHALER) 18 MCG inhalation capsule PLACE 1 CAPSULE INTO INHALER AND INHALE INTO LUNGS DAILY.   Current Facility-Administered Medications on File Prior to Visit  Medication  . 0.9 %  sodium chloride infusion  . aspirin EC tablet 325 mg  . niMODipine (NIMOTOP) capsule 0-60 mg     Allergies  Allergen Reactions  . Symbicort [Budesonide-Formoterol Fumarate] Shortness Of Breath, Nausea And Vomiting and Other (See Comments)    heachache  . Dulera [Mometasone Furo-Formoterol Fum] Other (See Comments)    Headache     Review Of Systems:  Constitutional:   No  weight loss, night sweats,  Fevers, chills, fatigue, or  lassitude.  HEENT:   No headaches,  Difficulty swallowing,  Tooth/dental problems, or  Sore throat,                No sneezing, itching, ear ache, nasal congestion, post nasal drip,   CV:  No chest pain,  Orthopnea, PND, swelling in lower extremities, anasarca, dizziness, palpitations, syncope.   GI  No heartburn, indigestion, abdominal pain, nausea, vomiting, diarrhea, change in bowel habits, loss of appetite, bloody stools.   Resp: + shortness of breath with exertion less at rest.  + excess mucus, + productive cough,  No non-productive cough,  No coughing up of blood.  + change in color of mucus.  +  wheezing.  No chest wall deformity  Skin: no rash or lesions.  GU: no dysuria, change in color of urine, no urgency or frequency.  No flank pain, no hematuria   MS:  No joint pain or swelling.  No decreased range of motion.  No back pain.  Psych:  No change in mood or affect. No depression or anxiety.  No memory loss.   Vital Signs BP 132/80 (BP Location: Left Arm, Cuff Size: Normal)   Pulse 77   Ht 6' (1.829 m)   Wt 182 lb (82.6 kg)   SpO2 98%   BMI 24.68 kg/m    Physical Exam:  General- No distress,  A&Ox 3 , pleasant ENT: No sinus tenderness, TM clear, pale nasal mucosa, no oral exudate,no post nasal drip, no LAN Cardiac: S1, S2, regular rate and rhythm, no murmur Chest: + Exp wheeze/ no rales/ dullness; no accessory muscle use, no nasal flaring, no sternal retractions Abd.: Soft Non-tender Ext: No clubbing cyanosis, edema Neuro:  normal strength Skin: No rashes, warm and dry Psych: normal mood and behavior   Assessment/Plan  COPD exacerbation (HCC) COPD Exacerbation within 1 month of previous exacerbation Per patient this is chest symptoms vs. sinus symptoms he had last month Plan:  CXR today Doxycycline 100 mg twice daily x 7 days. Prednisone taper; 10 mg tablets: 4 tabs x 2 days, 3 tabs x 2 days, 2 tabs x 2 days 1 tab x 2 days then stop. Continue using inhalers as you have been doing. Remember to rinse mouth after using all inhalers Mucinex 1200 mg once daily with full glass of water. Magic Mouthwash 1 teaspoon 3 times daily x 7 days. Swish and swallow. Please stop smoking. Follow up with Maralyn Sago  NP in 4 weeks . Please contact office for sooner follow up if symptoms do not improve or worsen or seek emergency care    Nodule of right lung  Will need follow up CT through screening program 02/2017  Tobacco use disorder Continues to smoke 10 cigarettes daily Recurrent COPD exacerbations and Lung RADS 4 A nodule Plan: I have spent 4 minutes counseling patient  on smoking cessation this visit. Explained the risks of continued smoking and that treatment for exacerbations are less effective with continued tobacco abuse.  Oral thrush Oral thrush Plan: Magic Mouthwash 5  cc TID x 7 days Rinse mouth after each  use inhalers    Bevelyn Ngo, NP 12/12/2016  10:08 AM

## 2016-12-12 NOTE — Patient Instructions (Addendum)
It is good to see you today. We will do a CXR today Doxycycline 100 mg twice daily x 7 days. Prednisone taper; 10 mg tablets: 4 tabs x 2 days, 3 tabs x 2 days, 2 tabs x 2 days 1 tab x 2 days then stop. Continue using inhalers as you have been doing. Remember to rinse mouth after using all inhalers Mucinex 1200 mg once daily with full glass of water. Magic Mouthwash 1 teaspoon 3 times daily x 7 days. Swish and swallow. Please stop smoking. Follow up with Maralyn Sago  NP in 4 weeks . Please contact office for sooner follow up if symptoms do not improve or worsen or seek emergency care

## 2016-12-19 MED FILL — BRILINTA 90 MG TABLET: 90 | 30 days supply | Qty: 60 | Fill #3

## 2016-12-19 MED FILL — PANTOPRAZOLE SOD DR 40 MG T: 40 | 30 days supply | Qty: 30 | Fill #1

## 2016-12-19 MED FILL — CloNIDine HCL 0.2 MG TAB: 0.2 | 90 days supply | Qty: 180 | Fill #2

## 2017-01-01 ENCOUNTER — Telehealth: Payer: Self-pay | Admitting: Internal Medicine

## 2017-01-01 NOTE — Telephone Encounter (Signed)
Rec'd FMLA forms via fax from [employer] - fwd to [employer] via interoffice mail-pr  °

## 2017-01-09 ENCOUNTER — Ambulatory Visit (INDEPENDENT_AMBULATORY_CARE_PROVIDER_SITE_OTHER): Payer: 59 | Admitting: Acute Care

## 2017-01-09 ENCOUNTER — Encounter: Payer: Self-pay | Admitting: Acute Care

## 2017-01-09 VITALS — BP 122/80 | HR 76 | Ht 72.0 in | Wt 184.0 lb

## 2017-01-09 DIAGNOSIS — R911 Solitary pulmonary nodule: Secondary | ICD-10-CM | POA: Diagnosis not present

## 2017-01-09 DIAGNOSIS — F17200 Nicotine dependence, unspecified, uncomplicated: Secondary | ICD-10-CM

## 2017-01-09 DIAGNOSIS — G4733 Obstructive sleep apnea (adult) (pediatric): Secondary | ICD-10-CM | POA: Diagnosis not present

## 2017-01-09 DIAGNOSIS — B370 Candidal stomatitis: Secondary | ICD-10-CM | POA: Diagnosis not present

## 2017-01-09 DIAGNOSIS — J441 Chronic obstructive pulmonary disease with (acute) exacerbation: Secondary | ICD-10-CM

## 2017-01-09 DIAGNOSIS — R5383 Other fatigue: Secondary | ICD-10-CM | POA: Insufficient documentation

## 2017-01-09 MED ORDER — NICOTINE 14 MG/24HR TD PT24: 14.0000 mg | MEDICATED_PATCH | Freq: Every day | TRANSDERMAL | 3 refills | Status: DC

## 2017-01-09 MED ORDER — MAGIC MOUTHWASH: 5.0000 mL | Freq: Three times a day (TID) | ORAL | 0 refills | Status: DC

## 2017-01-09 MED ORDER — FLUTICASONE-SALMETEROL 500-50 MCG/DOSE IN AEPB: 1.0000 | INHALATION_SPRAY | Freq: Two times a day (BID) | RESPIRATORY_TRACT | 3 refills | Status: DC

## 2017-01-09 MED ORDER — NICOTINE 10 MG IN INHA: 1.0000 | RESPIRATORY_TRACT | 0 refills | Status: DC | PRN

## 2017-01-09 MED ORDER — TIOTROPIUM BROMIDE MONOHYDRATE 18 MCG IN CAPS
ORAL_CAPSULE | RESPIRATORY_TRACT | 3 refills | Status: DC
Start: 2017-01-09 — End: 2018-01-23

## 2017-01-09 MED ORDER — FLUTICASONE PROPIONATE 50 MCG/ACT NA SUSP: 2.0000 | Freq: Every day | NASAL | 2 refills | Status: DC

## 2017-01-09 MED ORDER — ROFLUMILAST 500 MCG PO TABS: 500.0000 ug | ORAL_TABLET | Freq: Every day | ORAL | 3 refills | Status: DC

## 2017-01-09 MED FILL — DALIRESP 500 MCG TABLET: 500 | 30 days supply | Qty: 30 | Fill #3

## 2017-01-09 MED FILL — NICOTINE 14 MG/24HR PATCH: 14 | 28 days supply | Qty: 28 | Fill #0

## 2017-01-09 MED FILL — FENOFIBRATE 48 MG TABLET: 48 | 30 days supply | Qty: 30 | Fill #1

## 2017-01-09 MED FILL — FLUTICASONE PROP 50 MCG SPR: 50 | 30 days supply | Qty: 16 | Fill #0

## 2017-01-09 MED FILL — MAGIC MOUTHWASH BOP FORM: 7 days supply | Qty: 105 | Fill #0

## 2017-01-09 MED FILL — SPIRIVA 18 MCG CP-HANDIHALE: 18 | 90 days supply | Qty: 90 | Fill #3

## 2017-01-09 MED FILL — NICOTROL CARTRIDGE INHALER: 10 | 30 days supply | Qty: 168 | Fill #0

## 2017-01-09 NOTE — Assessment & Plan Note (Signed)
Continues to smoke Plan We spent a long time discussing the need to quit smoking. We discussed that medication can only go so far in regard to treating his shortness of breath. Plan We will schedule CT Follow up for after July 20th.( Order is already placed) Please quit smoking. Please call the Quit Smart Number for patches, gum or mints. Start the 14 mg patch. Nicotrol Inhaler prescription Nicoderm patches prescription, 14 mg Use Nicatrol for breakthrough cravings.. Follow up with Dr. Marchelle Gearing 7/9/as is scheduled Please contact office for sooner follow up if symptoms do not improve or worsen or seek emergency care

## 2017-01-09 NOTE — Assessment & Plan Note (Signed)
Lung RADS 4 a per lung cancer screening scan 11/22/2016 Plan Three-month follow-up low dose screening CT due July 2018 Patient verbalized understanding and is aware that we will get this scheduled today Order is currently in the system

## 2017-01-09 NOTE — Progress Notes (Signed)
History of Present Illness Derek Conner is a 56 y.o. male current smoker with COPD. He is followed by Dr. Marchelle Gearing.   01/09/2017 2 week Follow Up Appointment Pt. Presents for follow up. He was seen 12/13/2015 for COPD exacerbation. He was treated with Doxycycline and a prednisone taper.He states his cough is better. He does still have secretions that are white to foamy.He no longer has purulent secretions. He is continuing to smoke. He states he is still short of breath.We discussed the shortness of breath and the correlation with smoking. We discussed the use of NicoDerm patches to assist with his attempt at smoking cessation. He states he has used these in the past. He cannot tolerate the 21 mg patch, so wants to use the 14 mg patches. I explained that I will prescribe Nicotrol inhaler for breakthrough if needed. We spent a long time discussing the need to quit smoking, as it is now affecting his ability to breathe. Patient denies fever, chest pain, orthopnea, or hemoptysis. He does complain of continued dyspnea on exertion. He is compliant with his Spiriva, Advair, and Daliresp. Last low-dose lung cancer screening CT was read as a Lung RADS 4 A : suspicious findings, either short term follow up in 3 months or alternatively .3 month Follow up CT is due to July 2018.  Test Results:  Low Dose Lung Cancer Screening Follow Up CT:11/22/2016 No pleural effusion. Moderate changes of centrilobular emphysema noted. Diffuse bronchial wall thickening identified. Chronic scarring and architectural distortion within the posterior right upper lobe is again identified and appears similar to previous exam. Multiple pulmonary nodular densities are again identified in both lungs. Within the left upper lobe there is an enlarging nodule which has an equivalent diameter of 7.8 mm,. On the previous exam this nodule was present with an equivalent diameter of 2.2 mm.  CBC Latest Ref Rng & Units 10/20/2016  09/13/2016 05/18/2016  WBC 4.0 - 10.5 K/uL 13.8(H) 11.6(H) 14.4(H)  Hemoglobin 13.0 - 17.0 g/dL 16.1 09.6 12.4(L)  Hematocrit 39.0 - 52.0 % 40.0 43.0 37.7(L)  Platelets 150 - 400 K/uL 248 354.0 322    BMP Latest Ref Rng & Units 12/10/2016 10/20/2016 09/13/2016  Glucose 70 - 99 mg/dL 045(W) 098(J) 191(Y)  BUN 6 - 23 mg/dL 15 15 18   Creatinine 0.40 - 1.50 mg/dL 7.82) 9.56(O  Sodium 135 - 145 mEq/L 139 135 139  Potassium 3.5 - 5.1 mEq/L 3.9 4.1 4.6  Chloride 96 - 112 mEq/L 107 103 105  CO2 19 - 32 mEq/L 26 23 26   Calcium 8.4 - 10.5 mg/dL 9.7 9.5 9.7    BNP    Component Value Date/Time   BNP 17.9 05/16/2016 1523    ProBNP No results found for: PROBNP  PFT    Component Value Date/Time   FEV1PRE 1.38 10/25/2016 1028   FEV1POST 1.10 02/15/2014 1202   FVCPRE 3.24 10/25/2016 1028   FVCPOST 3.10 02/15/2014 1202   TLC 9.36 02/15/2014 1202   DLCOUNC 24.75 10/25/2016 1028   PREFEV1FVCRT 43 10/25/2016 1028   PSTFEV1FVCRT 36 02/15/2014 1202    Dg Chest 2 View  Result Date: 12/12/2016 CLINICAL DATA:  Shortness of breath and chest discomfort for 2 weeks. COPD with acute exacerbation. EXAM: CHEST  2 VIEW COMPARISON:  10/20/2016 FINDINGS: The heart size and mediastinal contours are within normal limits. Pulmonary emphysema again demonstrated. No evidence of pulmonary infiltrate, pleural effusion, or pneumothorax. IMPRESSION: Emphysema.  No acute findings. Electronically Signed   By: 02/11/2017  Eppie Gibson M.D.   On: 12/12/2016 11:59     Past medical hx Past Medical History:  Diagnosis Date  . Asthma   . CAP (community acquired pneumonia) 01/19/2016  . Chronic bronchitis (HCC)   . COPD (chronic obstructive pulmonary disease) (HCC)   . Emphysema lung (HCC)   . Emphysema of lung (HCC)   . Hepatitis A infection ~ 1980  . High cholesterol   . Hypertension   . Pneumonia 1960s  . Rheumatoid arthritis (HCC)    "hands mostly"  ( 01/19/2016)  . Situational depression 12-17-09   "only when my mother  died"     Social History  Substance Use Topics  . Smoking status: Current Every Day Smoker    Packs/day: 1.00    Years: 40.00    Types: Cigarettes  . Smokeless tobacco: Never Used     Comment: down to 0.5ppd 12/12/16  . Alcohol use 6.0 oz/week    10 Cans of beer per week     Comment: 12/04/2013 "2beers/day"    Tobacco Cessation: Ready to quit: No Counseling given: Yes I have spent 5-10  minutes counseling patient on smoking cessation this visit. He is being given the be stronger than your excuses card with resources on the back, in addition to discussing his plan with a quit day, and use of NicoDerm patches and Nicotrol inhaler to meet stated goal.  Past surgical hx, Family hx, Social hx all reviewed.  Current Outpatient Prescriptions on File Prior to Visit  Medication Sig  . albuterol (PROVENTIL HFA;VENTOLIN HFA) 108 (90 Base) MCG/ACT inhaler Inhale 2 puffs into the lungs every 4 (four) hours as needed for wheezing or shortness of breath.  . ALPRAZolam (XANAX) 0.5 MG tablet 1 tab po q day as needed severe anxiety/panic attack.  Marland Kitchen amLODipine (NORVASC) 10 MG tablet TAKE 1 TABLET (10 MG) BY MOUTH DAILY.  Marland Kitchen aspirin EC 81 MG tablet Take 81 mg by mouth daily.  . cloNIDine (CATAPRES) 0.2 MG tablet Take 1 tablet (0.2 mg total) by mouth 2 (two) times daily.  . famotidine (PEPCID) 20 MG tablet Take 1 tablet (20 mg total) by mouth at bedtime.  . fenofibrate (TRICOR) 48 MG tablet Take 1 tablet (48 mg total) by mouth daily.  . nicotine (NICODERM CQ - DOSED IN MG/24 HOURS) 14 mg/24hr patch Place 1 patch (14 mg total) onto the skin daily.  . pantoprazole (PROTONIX) 40 MG tablet Take 1 tablet (40 mg total) by mouth daily at 12 noon.  . ticagrelor (BRILINTA) 90 MG TABS tablet Take 45-90 mg by mouth 2 (two) times daily. 1 in the morning and 1/2 in the evening   Current Facility-Administered Medications on File Prior to Visit  Medication  . 0.9 %  sodium chloride infusion  . aspirin EC tablet 325  mg  . niMODipine (NIMOTOP) capsule 0-60 mg     Allergies  Allergen Reactions  . Symbicort [Budesonide-Formoterol Fumarate] Shortness Of Breath, Nausea And Vomiting and Other (See Comments)    heachache  . Dulera [Mometasone Furo-Formoterol Fum] Other (See Comments)    Headache     Review Of Systems:  Constitutional:   No  weight loss, night sweats,  Fevers, chills, fatigue, or  lassitude.  HEENT:   No headaches,  Difficulty swallowing,  Tooth/dental problems, or  Sore throat,                No sneezing, itching, ear ache, nasal congestion, post nasal drip,   CV:  No chest  pain,  Orthopnea, PND, swelling in lower extremities, anasarca, dizziness, palpitations, syncope.   GI  No heartburn, indigestion, abdominal pain, nausea, vomiting, diarrhea, change in bowel habits, loss of appetite, bloody stools.   Resp: + shortness of breath with exertion or at rest.  + excess mucus, + productive cough,  No non-productive cough,  No coughing up of blood.  No change in color of mucus.  + wheezing.  No chest wall deformity  Skin: no rash or lesions.  GU: no dysuria, change in color of urine, no urgency or frequency.  No flank pain, no hematuria   MS:  No joint pain or swelling.  No decreased range of motion.  No back pain.  Psych:  No change in mood or affect. No depression or anxiety.  No memory loss.   Vital Signs BP 122/80 (BP Location: Right Arm, Cuff Size: Normal)   Pulse 76   Ht 6' (1.829 m)   Wt 184 lb (83.5 kg)   SpO2 98%   BMI 24.95 kg/m    Physical Exam:  General- No distress,  A&Ox3, pleasant affect ENT: No sinus tenderness, TM clear, pale nasal mucosa, no oral exudate,no post nasal drip, no LAN, oral thrush Cardiac: S1, S2, regular rate and rhythm, no murmur Chest: Faint exp.  Wheeze/ no  rales/ dullness; no accessory muscle use, no nasal flaring, no sternal retractions Abd.: Soft Non-tender, nondistended, bowel sounds positive, Ext: No clubbing cyanosis,  edema Neuro:  normal strength Skin: No rashes, warm and dry Psych: normal mood and behavior   Assessment/Plan  COPD exacerbation (HCC) Resolved after additional treatment with doxycycline and prednisone taper Plan We will schedule CT Follow up for after July 20th.( Order is already placed) Please quit smoking. Please call the Quit Smart Number for patches, gum or mints. Start the 14 mg patch. Nicotrol Inhaler prescription Nicoderm patches prescription, 14 mg Use Nicatrol for breakthrough cravings. Magic Mouthwash 5 cc's three times daily x 7 days Continue your Spiriva, Advair and Daliresp. Rinse mouth after use We will renew all prescriptions. Zyrtec once daily at night. Flonase 2 puffs daily. We will schedule a home sleep study to evaluate for OSA. Follow up with Dr. Marchelle Gearing 7/9/as is scheduled Please contact office for sooner follow up if symptoms do not improve or worsen or seek emergency care    Nodule of right lung Lung RADS 4 a per lung cancer screening scan 11/22/2016 Plan Three-month follow-up low dose screening CT due July 2018 Patient verbalized understanding and is aware that we will get this scheduled today Order is currently in the system  Tobacco use disorder Continues to smoke Plan We spent a long time discussing the need to quit smoking. We discussed that medication can only go so far in regard to treating his shortness of breath. Plan We will schedule CT Follow up for after July 20th.( Order is already placed) Please quit smoking. Please call the Quit Smart Number for patches, gum or mints. Start the 14 mg patch. Nicotrol Inhaler prescription Nicoderm patches prescription, 14 mg Use Nicatrol for breakthrough cravings.. Follow up with Dr. 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

## 2017-01-09 NOTE — Assessment & Plan Note (Signed)
Patient continues to have oral thrush States he is not always good about using the Magic mouthwash, and also about rinsing his mouth after use of his inhale corticosteroids Plan Repeat treatment with Magic mouthwash 5 mL 3 times daily 7 days

## 2017-01-09 NOTE — Patient Instructions (Addendum)
We will schedule CT Follow up for after July 20th.( Order is already placed) Please quit smoking. Please call the Quit Smart Number for patches, gum or mints. Start the 14 mg patch. Nicotrol Inhaler prescription Nicoderm patches prescription, 14 mg Use Nicatrol for breakthrough cravings. Magic Mouthwash 5 cc's three times daily x 7 days Continue your Spiriva, Advair and Daliresp. Rinse mouth after use We will renew all prescriptions. Zyrtec once daily at night. Flonase 2 puffs daily. We will schedule a home sleep study to evaluate for OSA. Follow up with Dr. Marchelle Gearing 7/9/as is scheduled Please contact office for sooner follow up if symptoms do not improve or worsen or seek emergency care

## 2017-01-09 NOTE — Assessment & Plan Note (Signed)
Resolved after additional treatment with doxycycline and prednisone taper Plan We will schedule CT Follow up for after July 20th.( Order is already placed) Please quit smoking. Please call the Quit Smart Number for patches, gum or mints. Start the 14 mg patch. Nicotrol Inhaler prescription Nicoderm patches prescription, 14 mg Use Nicatrol for breakthrough cravings. Magic Mouthwash 5 cc's three times daily x 7 days Continue your Spiriva, Advair and Daliresp. Rinse mouth after use We will renew all prescriptions. Zyrtec once daily at night. Flonase 2 puffs daily. We will schedule a home sleep study to evaluate for OSA. Follow up with Dr. Marchelle Gearing 7/9/as is scheduled Please contact office for sooner follow up if symptoms do not improve or worsen or seek emergency care

## 2017-01-09 NOTE — Assessment & Plan Note (Signed)
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

## 2017-01-14 NOTE — Telephone Encounter (Signed)
Rec'd FMLA paperwork from [employer] - fwd to [place] to have MR complete - pr

## 2017-01-15 MED FILL — ADVAIR 500/50 DISKUS: 500-50 | 90 days supply | Qty: 180 | Fill #0

## 2017-01-17 NOTE — Telephone Encounter (Signed)
Will give to MR to complete once he is back in office.

## 2017-02-03 MED FILL — DALIRESP 500 MCG TABLET: 500 | 30 days supply | Qty: 30 | Fill #0

## 2017-02-07 ENCOUNTER — Telehealth (HOSPITAL_COMMUNITY): Payer: Self-pay | Admitting: *Deleted

## 2017-02-07 ENCOUNTER — Other Ambulatory Visit: Payer: Self-pay | Admitting: Rheumatology

## 2017-02-07 DIAGNOSIS — G4733 Obstructive sleep apnea (adult) (pediatric): Secondary | ICD-10-CM | POA: Diagnosis not present

## 2017-02-07 MED FILL — BRILINTA 90 MG TABLET: 90 | 30 days supply | Qty: 60 | Fill #0

## 2017-02-07 NOTE — Telephone Encounter (Signed)
Called and spoke with pharmacy.  Refilled Brilinta 45 mg BID x 3 refills.  Per Dr. Corliss Skains

## 2017-02-10 ENCOUNTER — Ambulatory Visit (INDEPENDENT_AMBULATORY_CARE_PROVIDER_SITE_OTHER): Payer: 59 | Admitting: Internal Medicine

## 2017-02-10 ENCOUNTER — Encounter: Payer: Self-pay | Admitting: Internal Medicine

## 2017-02-10 VITALS — BP 140/84 | HR 83 | Ht 72.0 in | Wt 185.0 lb

## 2017-02-10 DIAGNOSIS — F17210 Nicotine dependence, cigarettes, uncomplicated: Secondary | ICD-10-CM

## 2017-02-10 DIAGNOSIS — R911 Solitary pulmonary nodule: Secondary | ICD-10-CM | POA: Diagnosis not present

## 2017-02-10 DIAGNOSIS — J441 Chronic obstructive pulmonary disease with (acute) exacerbation: Secondary | ICD-10-CM | POA: Diagnosis not present

## 2017-02-10 MED ORDER — PREDNISONE 10 MG PO TABS: ORAL_TABLET | ORAL | 0 refills | Status: DC

## 2017-02-10 MED ORDER — CEPHALEXIN 500 MG PO CAPS: 500.0000 mg | ORAL_CAPSULE | Freq: Three times a day (TID) | ORAL | 0 refills | Status: DC

## 2017-02-10 MED FILL — CEPHALEXIN 500 MG CAPSULE: 500 | 5 days supply | Qty: 15 | Fill #0

## 2017-02-10 MED FILL — predniSONE 10 MG TABS: 10 | 8 days supply | Qty: 15 | Fill #0

## 2017-02-10 NOTE — Patient Instructions (Addendum)
ICD-10-CM   1. Chronic obstructive pulmonary disease with acute exacerbation (HCC) J44.1   2. Cigarette smoker F17.210   3. Nodule of left lung R91.1     Chronic obstructive pulmonary disease with acute exacerbation (HCC)  - Take prednisone 40 mg daily x 2 days, then 20mg  daily x 2 days, then 10mg  daily x 2 days, then 5mg  daily x 2 days and stop - Cephalexin 500mg  three times daily x  5 days - continue spiriva and advair scheduled with albuterol as needed  - flu shot in fall - ER if worse - any future flare ups - you can call and we can try phone Rx   Cigarette smoker - try to quit  Nodule of left lung - please keep previously scheduled appointment February 21, 2017  Followup 3 months or sooner

## 2017-02-10 NOTE — Telephone Encounter (Signed)
Rec'd completed forms back from [employer] - fwd to [employer] via [employer] - pr

## 2017-02-10 NOTE — Progress Notes (Signed)
Subjective:     Patient ID: Derek Conner, male   DOB: [birth year], 56 y.o.   MRN: [id number]  HPI 56 yo Native American/ smoker GOLD III copd as of 10/25/16 pfts on a typical day p taking spiriva/daliresp/advair 500     OV 07/24/2016  Chief Complaint  Patient presents with  . Follow-up    Pt states overall his breathing is doing well. Pt states he has an occassional prod cough with clear to white mucus. Pt denies CP/tightness and f/c/s.     [name] with smoking, pulmonary nodules in September 2016, frequent COPD exacerbation and severe COPD based on 2000 1550  Admitted 05/16/2016 for COPD exacerbation. After that he followed up with my colleague and was doing well. Since then he's continued to do well. His COPD stable. He presents with his wife/girlfriend who is a Engineer, civil (consulting) and 60s at [employer]. He continues to refuse vaccines. He is taking triple inhaler therapy and also Roflumilast. He has lost 25 pounds due to dietary changes and it is intentional. He feels overall well. He continues to smoke a little bit and he knows the importance of quitting. Last pulmonary nodule was summer 2016 on CT chest. Chest x-rays this you have been clear.  11/15/2016 acute extended ov/Wert re: aecopd / active smoker Chief Complaint  Patient presents with  . Acute Visit    Pt c/o sinus pressure, HA, wheezing, increased SOB and cough for the past 4 days. Cough is prod with moderate amounts of green to yellow sputum. He is using his albuterol inhaler 3-4 x per day.   onset 11/12/16 flared on maint rx =  spiriva/daliresp/advair 500  plus on alb early p afternoon typically needs at least once daily    recently completed taper off pred / using saba now up to 4 x daily and noct but typically comfortable at rest p saba rx  Says can't take dulera or symbicort  Cough is worse at hs and in am and thinks this triggered the sob/ wheeze with lost of nasal congestion/obst symptoms  No obvious patterns in day  to day or daytime variability or assoc mucus plugs or hemoptysis or cp or chest tightness, subjective wheeze or overt   hb symptoms. No unusual exp hx or h/o childhood pna/ asthma or knowledge of premature birth.   Also denies any obvious fluctuation of symptoms with weather or environmental changes or other aggravating or alleviating factors except as outlined above     01/09/2017 2 week Follow Up Appointment Pt. Presents for follow up. He was seen 12/13/2015 for COPD exacerbation. He was treated with Doxycycline and a prednisone taper.He states his cough is better. He does still have secretions that are white to foamy.He no longer has purulent secretions. He is continuing to smoke. He states he is still short of breath.We discussed the shortness of breath and the correlation with smoking. We discussed the use of NicoDerm patches to assist with his attempt at smoking cessation. He states he has used these in the past. He cannot tolerate the 21 mg patch, so wants to use the 14 mg patches. I explained that I will prescribe Nicotrol inhaler for breakthrough if needed. We spent a long time discussing the need to quit smoking, as it is now affecting his ability to breathe. Patient denies fever, chest pain, orthopnea, or hemoptysis. He does complain of continued dyspnea on exertion. He is compliant with his Spiriva, Advair, and Daliresp. Last low-dose lung cancer screening CT was  read as a Lung RADS 4 A : suspicious findings, either short term follow up in 3 months or alternatively .3 month Follow up CT is due to July 2018.    Test Results:  Low Dose Lung Cancer Screening Follow Up CT:11/22/2016 No pleural effusion. Moderate changes of centrilobular emphysema noted. Diffuse bronchial wall thickening identified. Chronic scarring and architectural distortion within the posterior right upper lobe is again identified and appears similar to previous exam. Multiple pulmonary nodular densities are again identified  in both lungs. Within the left upper lobe there is an enlarging nodule which has an equivalent diameter of 7.8 mm,. On the previous exam this nodule was present with an equivalent diameter of 2.2 mm.   OV 02/10/2017  Chief Complaint  Patient presents with  . Follow-up    Pt c/o increase in SOB, prod cough with white mucus, chest tightness x 3 days. Pt denies f/c/s.    Follow-up  #Advanced COPD: He is here with his wife. I did do an FMLA form for his wife in the last few days. He tells me that he is in the midst of an exacerbation. Overall in 2018 he has not had any admissions. He and his wife believe that the strategy of coming to the office or calling during an exacerbation has prevented admissions. 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 He thinks  Working hard and exposure to carpets makes his copd flare up. He is allergic to breo and dulera and does not want to change his spiriva and advair as as a result. He ison daliresp and is working well. He DOES NOT think he is ready for disability. Says he loves work  #lung nodule - left side has growin in June 2018. Has fu CT February 21, 2017  #Smoking - stil does   has a past medical history of Asthma; CAP (community acquired pneumonia) (01/19/2016); Chronic bronchitis (HCC); COPD (chronic obstructive pulmonary disease) (HCC); Emphysema lung (HCC); Emphysema of lung (HCC); Hepatitis A infection (~ 1980); High cholesterol; Hypertension; Pneumonia (1960s); Rheumatoid arthritis (HCC); and Situational depression (2011).   reports that he has been smoking Cigarettes.  He has a 40.00 pack-year smoking history. He has never used smokeless tobacco.  Past Surgical History:  Procedure Laterality Date  . APPENDECTOMY  1980's  . CHEST TUBE INSERTION Left 1990's   "lung collapsed"  . CYSTECTOMY Left 1960's   "wrist"  . FOOT FRACTURE SURGERY Left ~ 2005   "it was crushed"  . IR GENERIC HISTORICAL  03/27/2016   IR  ANGIOGRAM FOLLOW UP STUDY 03/27/2016 Julieanne Cotton, MD [facility]  . IR GENERIC HISTORICAL  03/27/2016   IR ANGIOGRAM FOLLOW UP STUDY 03/27/2016 Julieanne Cotton, MD [facility]  . IR GENERIC HISTORICAL  03/27/2016   IR ANGIO INTRA EXTRACRAN SEL COM CAROTID INNOMINATE UNI L MOD SED 03/27/2016 Julieanne Cotton, MD [facility]  . IR GENERIC HISTORICAL  03/27/2016   IR ANGIOGRAM FOLLOW UP STUDY 03/27/2016 Julieanne Cotton, MD [facility]  . IR GENERIC HISTORICAL  03/27/2016   IR ANGIO VERTEBRAL SEL SUBCLAVIAN INNOMINATE UNI R MOD SED 03/27/2016 Julieanne Cotton, MD [facility]  . IR GENERIC HISTORICAL  03/27/2016   IR 3D INDEPENDENT WKST 03/27/2016 Julieanne Cotton, MD [facility]  . IR GENERIC HISTORICAL  03/27/2016   IR ANGIOGRAM FOLLOW UP STUDY 03/27/2016 Julieanne Cotton, MD [facility]  . IR GENERIC HISTORICAL  03/27/2016   IR NEURO EACH ADD'L AFTER BASIC UNI RIGHT (  MS) 03/27/2016 Julieanne Cotton, MD [facility]  . IR GENERIC HISTORICAL  03/27/2016   IR ANGIOGRAM FOLLOW UP STUDY 03/27/2016 Julieanne Cotton, MD [facility]  . IR GENERIC HISTORICAL  03/27/2016   IR TRANSCATH/EMBOLIZ 03/27/2016 Julieanne Cotton, MD [facility]  . IR GENERIC HISTORICAL  03/27/2016   IR ANGIO INTRA EXTRACRAN SEL INTERNAL CAROTID UNI R MOD SED 03/27/2016 Julieanne Cotton, MD [facility]  . IR GENERIC HISTORICAL  03/27/2016   IR ANGIOGRAM FOLLOW UP STUDY 03/27/2016 Julieanne Cotton, MD [facility]  . IR GENERIC HISTORICAL  04/12/2016   IR RADIOLOGIST EVAL & MGMT 04/12/2016 [facility]  . RADIOLOGY WITH ANESTHESIA N/A 02/29/2016   Procedure: EMBOLIZATION     (RADIOLOGY WITH ANESTHESIA);  Surgeon: Julieanne Cotton, MD;  Location[facility];  Service: Radiology;  Laterality: N/A;  . RADIOLOGY WITH ANESTHESIA N/A 03/27/2016   Procedure: Embolization;  Surgeon: Julieanne Cotton, MD;  Location: [facility] OR;  Service: Radiology;  Laterality: N/A;  . VIDEO BRONCHOSCOPY Bilateral 12/06/2013   Procedure: VIDEO  BRONCHOSCOPY WITHOUT FLUORO;  Surgeon: Alyson Reedy, MD;  Location: [facility] END[facility]COPY;  Service: Cardiopulmonary;  Laterality: Bilateral;    Allergies  Allergen Reactions  . Symbicort [Budesonide-Formoterol Fumarate] Shortness Of Breath, Nausea And Vomiting and Other (See Comments)    heachache  . Dulera [Mometasone Furo-Formoterol Fum] Other (See Comments)    Headache     Immunization History  Administered Date(s) Administered  . Tdap 08/29/2015    Family History  Problem Relation Age of Onset  . Cancer Mother   . Arthritis Mother   . Stroke Father   . Hypertension Father   . Colon cancer Neg Hx      Current Outpatient Prescriptions:  .  albuterol (PROVENTIL HFA;VENTOLIN HFA) 108 (90 Base) MCG/ACT inhaler, Inhale 2 puffs into the lungs every 4 (four) hours as needed for wheezing or shortness of breath., Disp: , Rfl:  .  ALPRAZolam (XANAX) 0.5 MG tablet, 1 tab po q day as needed severe anxiety/panic attack., Disp: 30 tablet, Rfl: 0 .  amLODipine (NORVASC) 10 MG tablet, TAKE 1 TABLET (10 MG) BY MOUTH DAILY., Disp: 90 tablet, Rfl: 3 .  aspirin EC 81 MG tablet, Take 81 mg by mouth daily., Disp: , Rfl:  .  b complex vitamins tablet, Take 1 tablet by mouth daily., Disp: , Rfl:  .  cloNIDine (CATAPRES) 0.2 MG tablet, Take 1 tablet (0.2 mg total) by mouth 2 (two) times daily., Disp: 60 tablet, Rfl: 11 .  famotidine (PEPCID) 20 MG tablet, Take 1 tablet (20 mg total) by mouth at bedtime., Disp: 90 tablet, Rfl: 1 .  fenofibrate (TRICOR) 48 MG tablet, Take 1 tablet (48 mg total) by mouth daily., Disp: 30 tablet, Rfl: 3 .  fluticasone (FLONASE) 50 MCG/ACT nasal spray, Place 2 sprays into both nostrils daily., Disp: 16 g, Rfl: 2 .  Fluticasone-Salmeterol (ADVAIR DISKUS) 500-50 MCG/DOSE AEPB, Inhale 1 puff into the lungs 2 (two) times daily., Disp: 60 each, Rfl: 3 .  nicotine (NICODERM CQ) 14 mg/24hr patch, Place 1 patch (14 mg total) onto the skin daily., Disp: 28 patch, Rfl: 3 .  nicotine  (NICOTROL) 10 MG inhaler, Inhale 1 Cartridge (1 continuous puffing total) into the lungs as needed for smoking cessation., Disp: 42 each, Rfl: 0 .  pantoprazole (PROTONIX) 40 MG tablet, Take 1 tablet (40 mg total) by mouth daily at 12 noon., Disp: 30 tablet, Rfl: 3 .  roflumilast (DALIRESP) 500 MCG TABS tablet, Take 1 tablet (500  mcg total) by mouth daily., Disp: 30 tablet, Rfl: 3 .  ticagrelor (BRILINTA) 90 MG TABS tablet, Take 45-90 mg by mouth 2 (two) times daily. 1 in the morning and 1/2 in the evening, Disp: , Rfl:  .  tiotropium (SPIRIVA HANDIHALER) 18 MCG inhalation capsule, PLACE 1 CAPSULE INTO INHALER AND INHALE INTO LUNGS DAILY., Disp: 90 capsule, Rfl: 3 No current facility-administered medications for this visit.   Facility-Administered Medications Ordered in Other Visits:  .  0.9 %  sodium chloride infusion, , Intravenous, Continuous, Ralene Muskrat, PA-C .  aspirin EC tablet 325 mg, 325 mg, Oral, 60 min Pre-Op, Turpin, Pamela, PA-C .  niMODipine (NIMOTOP) capsule 0-60 mg, 0-60 mg, Oral, 60 min Pre-Op, Ralene Muskrat, PA-C   Review of Systems     Objective:   Physical Exam  Constitutional: He is oriented to person, place, and time. He appears well-developed and well-nourished. No distress.  HENT:  Head: Normocephalic and atraumatic.  Right Ear: External ear normal.  Left Ear: External ear normal.  Mouth/Throat: Oropharynx is clear and moist. No oropharyngeal exudate.  No thrush  Eyes: Conjunctivae and EOM are normal. Pupils are equal, round, and reactive to light. Right eye exhibits no discharge. Left eye exhibits no discharge. No scleral icterus.  Neck: Normal range of motion. Neck supple. No JVD present. No tracheal deviation present. No thyromegaly present.  Cardiovascular: Normal rate, regular rhythm and intact distal pulses.  Exam reveals no gallop and no friction rub.   No murmur heard. Pulmonary/Chest: Effort normal and breath sounds normal. No respiratory distress. He  has no wheezes. He has no rales. He exhibits no tenderness.  barrell chest' Mild purse lip breathing Wheeze faint and scatterd  Abdominal: Soft. Bowel sounds are normal. He exhibits no distension and no mass. There is no tenderness. There is no rebound and no guarding.  Musculoskeletal: Normal range of motion. He exhibits no edema or tenderness.  Lymphadenopathy:    He has no cervical adenopathy.  Neurological: He is alert and oriented to person, place, and time. He has normal reflexes. No cranial nerve deficit. Coordination normal.  Skin: Skin is warm and dry. No rash noted. He is not diaphoretic. No erythema. No pallor.  Psychiatric: He has a normal mood and affect. His behavior is normal. Judgment and thought content normal.  Nursing note and vitals reviewed.   Vitals:   02/10/17 1027  BP: 140/84  Pulse: 83  SpO2: 97%  Weight: 185 lb (83.9 kg)  Height: 6' (1.829 m)    Estimated body mass index is 25.09 kg/m as calculated from the following:   Height as of this encounter: 6' (1.829 m).   Weight as of this encounter: 185 lb (83.9 kg).      Assessment:       ICD-10-CM   1. Chronic obstructive pulmonary disease with acute exacerbation (HCC) J44.1   2. Cigarette smoker F17.210   3. Nodule of left lung R91.1        Plan:       Chronic obstructive pulmonary disease with acute exacerbation (HCC)  - Take prednisone 40 mg daily x 2 days, then 20mg  daily x 2 days, then 10mg  daily x 2 days, then 5mg  daily x 2 days and stop - Cephalexin 500mg  three times daily x  5 days - continue spiriva and advair scheduled with albuterol as needed  - flu shot in fall - ER if worse - any future flare ups - you can call and we can  try phone Rx   Cigarette smoker - try to quit  Nodule of left lung - please keep previously scheduled appointment February 21, 2017  Followup 3 months or sooner   Dr. Kalman Shan, M.D., [facility].C.P Pulmonary and Critical Care Medicine Staff  Physician [facility] Pager: [phone], If no answer or between  15:00h - 7:00h: call [phone]  02/10/2017 11:10 AM

## 2017-02-13 MED FILL — FENOFIBRATE 48 MG TABLET: 48 | 30 days supply | Qty: 30 | Fill #2

## 2017-02-14 ENCOUNTER — Other Ambulatory Visit: Payer: Self-pay | Admitting: *Deleted

## 2017-02-14 DIAGNOSIS — G4733 Obstructive sleep apnea (adult) (pediatric): Secondary | ICD-10-CM | POA: Diagnosis not present

## 2017-02-24 ENCOUNTER — Ambulatory Visit (HOSPITAL_BASED_OUTPATIENT_CLINIC_OR_DEPARTMENT_OTHER)
Admission: RE | Admit: 2017-02-24 | Discharge: 2017-02-24 | Disposition: A | Discharge status: Home / Self Care | Payer: 59 | Source: Ambulatory Visit | Attending: Acute Care | Admitting: Acute Care

## 2017-02-24 DIAGNOSIS — R918 Other nonspecific abnormal finding of lung field: Secondary | ICD-10-CM | POA: Diagnosis not present

## 2017-02-24 DIAGNOSIS — F17210 Nicotine dependence, cigarettes, uncomplicated: Secondary | ICD-10-CM | POA: Insufficient documentation

## 2017-02-24 DIAGNOSIS — J432 Centrilobular emphysema: Secondary | ICD-10-CM | POA: Diagnosis not present

## 2017-02-24 DIAGNOSIS — R911 Solitary pulmonary nodule: Secondary | ICD-10-CM | POA: Diagnosis not present

## 2017-02-24 DIAGNOSIS — J439 Emphysema, unspecified: Secondary | ICD-10-CM | POA: Diagnosis not present

## 2017-02-24 DIAGNOSIS — I700 Atherosclerosis of aorta: Secondary | ICD-10-CM | POA: Diagnosis not present

## 2017-02-26 ENCOUNTER — Telehealth: Payer: Self-pay | Admitting: Internal Medicine

## 2017-02-26 ENCOUNTER — Other Ambulatory Visit: Payer: Self-pay | Admitting: Acute Care

## 2017-02-26 DIAGNOSIS — G4734 Idiopathic sleep related nonobstructive alveolar hypoventilation: Secondary | ICD-10-CM

## 2017-02-26 NOTE — Telephone Encounter (Signed)
Called and spoke to pt. Pt has questions regarding his sleep study. Pt questioning what his spO2 was during his desaturation and for how long.   Sarah please advise. Thanks.

## 2017-02-26 NOTE — Telephone Encounter (Signed)
Spoke with pt's spouse, who is requesting additional information about pt's oxygen levels.  Derek Conner would like to know how low pt's O2 dropped during sleep and for how long. Will close this encounter, as another message as been created.

## 2017-02-26 NOTE — Telephone Encounter (Signed)
Please let patient know he desaturated to 85% 11 minutes of the sleep study. This does qualify him for nocturnal oxygen. Thank you so much

## 2017-02-27 ENCOUNTER — Other Ambulatory Visit: Payer: Self-pay | Admitting: Acute Care

## 2017-02-27 DIAGNOSIS — F17210 Nicotine dependence, cigarettes, uncomplicated: Principal | ICD-10-CM

## 2017-02-27 NOTE — Telephone Encounter (Signed)
Called and spoke to pt. Informed him of the results and recs per SG. Pt verbalized understanding and states he will speak with his wife and call us back with a decision. Will await call.

## 2017-02-28 ENCOUNTER — Telehealth: Payer: Self-pay | Admitting: Acute Care

## 2017-02-28 NOTE — Telephone Encounter (Signed)
It is certainly the patient's option not to wear oxygen even though we have recommended that he does wear it at night. Please have him address this issue with Dr. Marchelle Gearing  when he sees him in October. Thanks so much

## 2017-02-28 NOTE — Telephone Encounter (Signed)
Spoke with patient-he declined O2 as he states his wife is a Engineer, civil (consulting) and when they spoke about his O2 levels and only being 11 minutes in 1 night that his wife and him decided that it was not long enough to take on O2. Pt states he would be willing to retake the test in next 3-6 months to see if things has progressively gotten worse. Will forward to SG as FYI and close message.

## 2017-03-03 ENCOUNTER — Telehealth: Payer: Self-pay | Admitting: Internal Medicine

## 2017-03-03 MED ORDER — PREDNISONE 10 MG PO TABS: ORAL_TABLET | ORAL | 0 refills | Status: DC

## 2017-03-03 MED ORDER — AZITHROMYCIN 250 MG PO TABS: ORAL_TABLET | ORAL | 0 refills | Status: DC

## 2017-03-03 MED FILL — DALIRESP 500 MCG TABLET: 500 | 30 days supply | Qty: 30 | Fill #1

## 2017-03-03 MED FILL — AZITHROMYCIN 250 MG TABLET: 250 | 5 days supply | Qty: 6 | Fill #0

## 2017-03-03 MED FILL — BRILINTA 90 MG TABLET: 90 | 30 days supply | Qty: 60 | Fill #1

## 2017-03-03 MED FILL — predniSONE 10 MG TABS: 10 | 12 days supply | Qty: 30 | Fill #0

## 2017-03-03 NOTE — Telephone Encounter (Signed)
As always it is the patient's option to refuse recommended treatment. Derek Conner decision to defer on oxygen is noted.

## 2017-03-03 NOTE — Telephone Encounter (Signed)
Spoke with pt, c/o increased sob, wheezing, prod cough with white mucus , fatigue, weakness.  S/s present X 3 days.  Denies fever, CP.    Pt has been taking mucinex and tylenol to help with s/s, as well as maintenance and emergency inhaled meds.  Requesting further recs- particularly a pred taper and abx.    Pt uses [facility].      Sending to DOD as MR is unavailable today.  PM please advise on recs.  Thanks!

## 2017-03-03 NOTE — Telephone Encounter (Signed)
Called and spoke with pt and he stated that he will hold off on the oxygen at this time.  Will route back to SG to make her aware.

## 2017-03-03 NOTE — Telephone Encounter (Signed)
Please call in z pack and prednisone starting at 40 mg. Reduce prednisone by 10 mg every 3 days.   Chilton Greathouse MD [facility] 03/03/2017, 9:55 AM

## 2017-03-03 NOTE — Telephone Encounter (Signed)
Pt aware of recs, rx's sent to preferred pharmacy.  Nothing further needed.

## 2017-03-12 ENCOUNTER — Other Ambulatory Visit: Payer: Self-pay | Admitting: Medical

## 2017-03-12 ENCOUNTER — Other Ambulatory Visit: Payer: Self-pay | Admitting: Family Medicine

## 2017-03-12 DIAGNOSIS — I10 Essential (primary) hypertension: Secondary | ICD-10-CM

## 2017-03-12 MED FILL — AMLODIPINE BESYLATE 10 MG T: 10 | 90 days supply | Qty: 90 | Fill #0

## 2017-03-12 MED FILL — FENOFIBRATE 48 MG TABLET: 48 | 30 days supply | Qty: 30 | Fill #3

## 2017-03-13 ENCOUNTER — Other Ambulatory Visit: Payer: 59

## 2017-03-18 MED FILL — FAMOTIDINE 20 MG TABLET: 20 | 90 days supply | Qty: 90 | Fill #0

## 2017-03-26 ENCOUNTER — Encounter: Payer: Self-pay | Admitting: Medical

## 2017-03-26 ENCOUNTER — Telehealth: Payer: Self-pay | Admitting: Medical

## 2017-03-26 ENCOUNTER — Other Ambulatory Visit (INDEPENDENT_AMBULATORY_CARE_PROVIDER_SITE_OTHER): Payer: 59

## 2017-03-26 DIAGNOSIS — E781 Pure hyperglyceridemia: Secondary | ICD-10-CM | POA: Diagnosis not present

## 2017-03-26 DIAGNOSIS — R739 Hyperglycemia, unspecified: Secondary | ICD-10-CM | POA: Diagnosis not present

## 2017-03-26 LAB — LIPID PANEL
Cholesterol: 202 mg/dL — ABNORMAL HIGH (ref 0–200)
HDL: 53.7 mg/dL (ref >39.00)
LDL Cholesterol: 121 mg/dL — ABNORMAL HIGH (ref 0–99)
NONHDL: 148.07
Total CHOL/HDL Ratio: 4
Triglycerides: 134 mg/dL (ref 0.0–149.0)
VLDL: 26.8 mg/dL (ref 0.0–40.0)

## 2017-03-26 LAB — HEMOGLOBIN A1C: Hgb A1c MFr Bld: 6.5 % (ref 4.6–6.5)

## 2017-03-26 MED ORDER — METFORMIN HCL 500 MG PO TABS: ORAL_TABLET | ORAL | 1 refills | Status: DC

## 2017-03-26 NOTE — Telephone Encounter (Signed)
Rx of metformin sent to pharmacy.

## 2017-03-31 MED FILL — DALIRESP 500 MCG TABLET: 500 | 30 days supply | Qty: 30 | Fill #2

## 2017-03-31 MED FILL — PANTOPRAZOLE SOD DR 40 MG T: 40 | 60 days supply | Qty: 60 | Fill #2

## 2017-03-31 MED FILL — BRILINTA 90 MG TABLET: 90 | 30 days supply | Qty: 60 | Fill #2

## 2017-03-31 MED FILL — CloNIDine HCL 0.2 MG TAB: 0.2 | 90 days supply | Qty: 180 | Fill #3

## 2017-04-01 MED FILL — metFORMIN HCL 500 MG TABS: 500 | 90 days supply | Qty: 90 | Fill #0

## 2017-04-03 ENCOUNTER — Ambulatory Visit (INDEPENDENT_AMBULATORY_CARE_PROVIDER_SITE_OTHER)
Admission: RE | Admit: 2017-04-03 | Discharge: 2017-04-03 | Disposition: A | Discharge status: Home / Self Care | Payer: 59 | Source: Ambulatory Visit | Attending: Acute Care | Admitting: Acute Care

## 2017-04-03 ENCOUNTER — Ambulatory Visit (INDEPENDENT_AMBULATORY_CARE_PROVIDER_SITE_OTHER): Payer: 59 | Admitting: Acute Care

## 2017-04-03 ENCOUNTER — Encounter: Payer: Self-pay | Admitting: Acute Care

## 2017-04-03 VITALS — BP 132/88 | HR 75 | Ht 72.0 in | Wt 179.8 lb

## 2017-04-03 DIAGNOSIS — G4734 Idiopathic sleep related nonobstructive alveolar hypoventilation: Secondary | ICD-10-CM

## 2017-04-03 DIAGNOSIS — J189 Pneumonia, unspecified organism: Secondary | ICD-10-CM

## 2017-04-03 DIAGNOSIS — F17210 Nicotine dependence, cigarettes, uncomplicated: Secondary | ICD-10-CM | POA: Diagnosis not present

## 2017-04-03 DIAGNOSIS — J441 Chronic obstructive pulmonary disease with (acute) exacerbation: Secondary | ICD-10-CM

## 2017-04-03 DIAGNOSIS — J181 Lobar pneumonia, unspecified organism: Secondary | ICD-10-CM | POA: Diagnosis not present

## 2017-04-03 DIAGNOSIS — R05 Cough: Secondary | ICD-10-CM | POA: Diagnosis not present

## 2017-04-03 MED ORDER — DOXYCYCLINE HYCLATE 100 MG PO TABS: 100.0000 mg | ORAL_TABLET | Freq: Two times a day (BID) | ORAL | 0 refills | Status: DC

## 2017-04-03 MED ORDER — ALBUTEROL SULFATE (2.5 MG/3ML) 0.083% IN NEBU: 2.5000 mg | INHALATION_SOLUTION | Freq: Four times a day (QID) | RESPIRATORY_TRACT | 12 refills | Status: DC | PRN

## 2017-04-03 MED ORDER — PREDNISONE 10 MG PO TABS: ORAL_TABLET | ORAL | 0 refills | Status: DC

## 2017-04-03 MED FILL — predniSONE 10 MG TABS: 10 | 8 days supply | Qty: 20 | Fill #0

## 2017-04-03 MED FILL — DOXYCYCLINE HYCLATE 100 MG: 100 | 7 days supply | Qty: 14 | Fill #0

## 2017-04-03 MED FILL — ALBUTEROL 0.083% INHAL SOLN: (2.5 MG/3ML | 6 days supply | Qty: 75 | Fill #0

## 2017-04-03 NOTE — Assessment & Plan Note (Signed)
Moderate Exacerbation Plan CXR today We will call you with results We will order a neb machine  We will order albuterol nebulizer medication. Use as needed for shortness of breath or wheezing up to 4 times a day. Doxycycline 100 mg twice daily x 7 days Prednisone taper; 10 mg tablets: 4 tabs x 2 days, 3 tabs x 2 days, 2 tabs x 2 days 1 tab x 2 days then stop. Continue Advair and Spiriva daily as you have been doing. Rinse mouth after use. Follow up in 2-3 weeks with NP or Dr. Marchelle Conner Please contact office for sooner follow up if symptoms do not improve or worsen or seek emergency care

## 2017-04-03 NOTE — Assessment & Plan Note (Signed)
We will order nocturnal oxygen today. Wear at [address] [place]

## 2017-04-03 NOTE — Progress Notes (Addendum)
History of Present Illness Derek Conner is a 56 y.o. male  Current every day smoker with GOLD III copd as of 10/25/16 pPFT's. He is followed by Dr. Marchelle Gearing.   Maintenance regimen: spiriva/daliresp/advair 500    04/03/2017 Follow Up for Home Sleep Study: Pt. Presents for review of Home Sleep Study. When he was called with results of his sleep study and the suggestion that he start nocturnal oxygen.  At the time of the call he stated he did not want to wear nocturnal oxygen until more specifics about sleep test results could be answered. Today in the office we reviewed every aspect of the sleep test. I have supplied him with a copy of the sleep test. He has subsequently agreed to wearing nocturnal oxygen. The patient has complaints today of what he considers to be early stages of a COPD flare. He states he has cough, congestion, and productive cough with sputum that is green. He states he has had fever on and off however has not checked his fever as he and his wife do not own a thermometer. He is compliant with his Advair, Spiriva, and Daliresp. He states his shortness of breath has progressively worsened over the last 1-2 days.  Test Results: Chest x-ray 04/03/2017 IMPRESSION: 1. New infiltrate left upper lobe. Findings suggest pneumonia. Active granulomas disease cannot be excluded . Close follow-up chest x-rays suggested to demonstrate clearing.  Home sleep study 02/07/2017 Did not qualify for OSA ( AHI equals 3.3) Oxygen desaturations with sleep to 85% for 11 minutes Qualifies for nocturnal oxygen   CBC Latest Ref Rng & Units 10/20/2016 09/13/2016 05/18/2016  WBC 4.0 - 10.5 K/uL 13.8(H) 11.6(H) 14.4(H)  Hemoglobin 13.0 - 17.0 g/dL 15.4 00.8 12.4(L)  Hematocrit 39.0 - 52.0 % 40.0 43.0 37.7(L)  Platelets 150 - 400 K/uL 248 354.0 322    BMP Latest Ref Rng & Units 12/10/2016 10/20/2016 09/13/2016  Glucose 70 - 99 mg/dL 676(P) 950(D) 326(Z)  BUN 6 - 23 mg/dL 15 15 18   Creatinine 0.40 -  1.50 mg/dL 1.24) 5.80(D  Sodium 135 - 145 mEq/L 139 135 139  Potassium 3.5 - 5.1 mEq/L 3.9 4.1 4.6  Chloride 96 - 112 mEq/L 107 103 105  CO2 19 - 32 mEq/L 26 23 26   Calcium 8.4 - 10.5 mg/dL 9.7 9.5 9.7    BNP    Component Value Date/Time   BNP 17.9 05/16/2016 1523     PFT    Component Value Date/Time   FEV1PRE 1.38 10/25/2016 1028   FEV1POST 1.10 02/15/2014 1202   FVCPRE 3.24 10/25/2016 1028   FVCPOST 3.10 02/15/2014 1202   TLC 9.36 02/15/2014 1202   DLCOUNC 24.75 10/25/2016 1028   PREFEV1FVCRT 43 10/25/2016 1028   PSTFEV1FVCRT 36 02/15/2014 1202      Past medical hx Past Medical History:  Diagnosis Date  . Asthma   . CAP (community acquired pneumonia) 01/19/2016  . Chronic bronchitis (HCC)   . COPD (chronic obstructive pulmonary disease) (HCC)   . Emphysema lung (HCC)   . Emphysema of lung (HCC)   . Hepatitis A infection ~ 1980  . High cholesterol   . Hypertension   . Pneumonia 1960s  . Rheumatoid arthritis (HCC)    "hands mostly"  ( 01/19/2016)  . Situational depression 11/28/2009   "only when my mother died"     Social History  Substance Use Topics  . Smoking status: Current Every Day Smoker    Packs/day: 1.00    Years:  40.00    Types: Cigarettes  . Smokeless tobacco: Never Used     Comment: 3 cigs per day 02/10/17   . Alcohol use 6.0 oz/week    10 Cans of beer per week     Comment: 12/04/2013 "2beers/day"    Derek Conner reports that he has been smoking Cigarettes.  He has a 40.00 pack-year smoking history. He has never used smokeless tobacco. He reports that he drinks about 6.0 oz of alcohol per week . He reports that he does not use drugs.  Tobacco Cessation: Patient is an everyday smoker I have spent 5 minutes counseling patient on smoking cessation this visit. Discussed in detail the risks of smoking while using oxygen Discussed in detail the risk of open flame around oxygen for combustion Discussed in detail the risk of flash burns to face and  eyes and upper airway while using cigarettes with oxygen.  Past surgical hx, Family hx, Social hx all reviewed.  Current Outpatient Prescriptions on File Prior to Visit  Medication Sig  . albuterol (PROVENTIL HFA;VENTOLIN HFA) 108 (90 Base) MCG/ACT inhaler Inhale 2 puffs into the lungs every 4 (four) hours as needed for wheezing or shortness of breath.  . ALPRAZolam (XANAX) 0.5 MG tablet 1 tab po q day as needed severe anxiety/panic attack.  Marland Kitchen amLODipine (NORVASC) 10 MG tablet TAKE 1 TABLET (10 MG) BY MOUTH DAILY.  Marland Kitchen aspirin EC 81 MG tablet Take 81 mg by mouth daily.  Marland Kitchen b complex vitamins tablet Take 1 tablet by mouth daily.  . cloNIDine (CATAPRES) 0.2 MG tablet Take 1 tablet (0.2 mg total) by mouth 2 (two) times daily.  . famotidine (PEPCID) 20 MG tablet TAKE 1 TABLET (20 MG TOTAL) BY MOUTH AT BEDTIME.  . fenofibrate (TRICOR) 48 MG tablet Take 1 tablet (48 mg total) by mouth daily.  . fluticasone (FLONASE) 50 MCG/ACT nasal spray Place 2 sprays into both nostrils daily.  . Fluticasone-Salmeterol (ADVAIR DISKUS) 500-50 MCG/DOSE AEPB Inhale 1 puff into the lungs 2 (two) times daily.  . metFORMIN (GLUCOPHAGE) 500 MG tablet 1 tablet by mouth daily  . nicotine (NICODERM CQ) 14 mg/24hr patch Place 1 patch (14 mg total) onto the skin daily.  . nicotine (NICOTROL) 10 MG inhaler Inhale 1 Cartridge (1 continuous puffing total) into the lungs as needed for smoking cessation.  . pantoprazole (PROTONIX) 40 MG tablet Take 1 tablet (40 mg total) by mouth daily at 12 noon.  . roflumilast (DALIRESP) 500 MCG TABS tablet Take 1 tablet (500 mcg total) by mouth daily.  . ticagrelor (BRILINTA) 90 MG TABS tablet Take 45-90 mg by mouth 2 (two) times daily. 1 in the morning and 1/2 in the evening  . tiotropium (SPIRIVA HANDIHALER) 18 MCG inhalation capsule PLACE 1 CAPSULE INTO INHALER AND INHALE INTO LUNGS DAILY.   Current Facility-Administered Medications on File Prior to Visit  Medication  . 0.9 %  sodium  chloride infusion  . aspirin EC tablet 325 mg  . niMODipine (NIMOTOP) capsule 0-60 mg     Allergies  Allergen Reactions  . Symbicort [Budesonide-Formoterol Fumarate] Shortness Of Breath, Nausea And Vomiting and Other (See Comments)    heachache  . Dulera [Mometasone Furo-Formoterol Fum] Other (See Comments)    Headache     Review Of Systems:  Constitutional:   No  weight loss, night sweats,  +Fevers, chills, fatigue, or  lassitude.  HEENT:   No headaches,  Difficulty swallowing,  Tooth/dental problems, or  Sore throat,  No sneezing, itching, ear ache, nasal congestion, post nasal drip,   CV:  No chest pain,  Orthopnea, PND, swelling in lower extremities, anasarca, dizziness, palpitations, syncope.   GI  No heartburn, indigestion, abdominal pain, nausea, vomiting, diarrhea, change in bowel habits, loss of appetite, bloody stools.   Resp: + shortness of breath with exertion or at rest. + excess mucus, + productive cough,  No non-productive cough,  No coughing up of blood.  + change in color of mucus.  + wheezing.  No chest wall deformity  Skin: no rash or lesions.  GU: no dysuria, change in color of urine, no urgency or frequency.  No flank pain, no hematuria   MS:  No joint pain or swelling.  No decreased range of motion.  No back pain.  Psych:  No change in mood or affect. No depression or anxiety.  No memory loss.   Vital Signs BP 132/88 (BP Location: Left Arm, Cuff Size: Normal)   Pulse 75   Ht 6' (1.829 m)   Wt 179 lb 12.8 oz (81.6 kg)   SpO2 97%   BMI 24.39 kg/m    Physical Exam:  General- No distress,  A&Ox3, pleasant ENT: No sinus tenderness, TM clear, pale nasal mucosa, no oral exudate,no post nasal drip, no LAN Cardiac: S1, S2, regular rate and rhythm, no murmur Chest: + wheeze/ rales/ dullness; no accessory muscle use, no nasal flaring, no sternal retractions Abd.: Soft Non-tender, nondistended, bowel sounds positive Ext: No clubbing  cyanosis, edema Neuro:  normal strength, cranial nerves intact Skin: No rashes, warm and dry Psych: normal mood and behavior   Assessment/Plan  Cigarette smoker Current every day smoker Plan Counseled extensively to quit smoking Please quit smoking. Please never smoke while wearing oxygen. Risks reviewed with patient regarding smoking while on oxygen   Nocturnal hypoxemia We will order nocturnal oxygen today. Wear at 2 L [place]   COPD exacerbation (HCC) Moderate Exacerbation Plan CXR today We will call you with results We will order a neb machine  We will order albuterol nebulizer medication. Use as needed for shortness of breath or wheezing up to 4 times a day. Doxycycline 100 mg twice daily x 7 days Prednisone taper; 10 mg tablets: 4 tabs x 2 days, 3 tabs x 2 days, 2 tabs x 2 days 1 tab x 2 days then stop. Continue Advair and Spiriva daily as you have been doing. Rinse mouth after use. Follow up in 2-3 weeks with NP or Dr. Marchelle Gearing Please contact office for sooner follow up if symptoms do not improve or worsen or seek emergency care    Community acquired pneumonia IMPRESSION: 1. New infiltrate left upper lobe. Findings suggest pneumonia. Active granulomas disease cannot be excluded . Close follow-up chest x-rays suggested to demonstrate clearing. Plan Doxycycline 100 mg twice a day 7 days Prednisone taper; 10 mg tablets: 4 tabs x 2 days, 3 tabs x 2 days, 2 tabs x 2 days 1 tab x 2 days then stop. Follow-up in 2-3 weeks with NP or Ramaswamy  with chest x-ray prior    Bevelyn Ngo, NP 04/03/2017  4:06 PM

## 2017-04-03 NOTE — Assessment & Plan Note (Signed)
Current every day smoker Plan Counseled extensively to quit smoking Please quit smoking. Please never smoke while wearing oxygen. Risks reviewed with patient regarding smoking while on oxygen

## 2017-04-03 NOTE — Patient Instructions (Addendum)
It is nice to meet you today. Please quit smoking. Please never smoke while wearing oxygen.  We will order nocturnal oxygen today. Wear at 2 L [place]  CXR today We will call you with results We will order a neb machine  We will order albuterol nebulizer medication. Use as needed for shortness of breath or wheezing up to 4 times a day. Doxycycline 100 mg twice daily x 7 days Prednisone taper; 10 mg tablets: 4 tabs x 2 days, 3 tabs x 2 days, 2 tabs x 2 days 1 tab x 2 days then stop. Continue Advair and Spiriva daily as you have been doing. Rinse mouth after use. Follow up in 2-3 weeks with NP or Dr. Marchelle Gearing Please contact office for sooner follow up if symptoms do not improve or worsen or seek emergency care

## 2017-04-03 NOTE — Assessment & Plan Note (Signed)
IMPRESSION: 1. New infiltrate left upper lobe. Findings suggest pneumonia. Active granulomas disease cannot be excluded . Close follow-up chest x-rays suggested to demonstrate clearing. Plan Doxycycline 100 mg twice a day 7 days Prednisone taper; 10 mg tablets: 4 tabs x 2 days, 3 tabs x 2 days, 2 tabs x 2 days 1 tab x 2 days then stop. Follow-up in 2-3 weeks with NP or Ramaswamy  with chest x-ray prior

## 2017-04-04 ENCOUNTER — Telehealth: Payer: Self-pay | Admitting: Acute Care

## 2017-04-04 DIAGNOSIS — J449 Chronic obstructive pulmonary disease, unspecified: Secondary | ICD-10-CM

## 2017-04-04 NOTE — Telephone Encounter (Signed)
Spoke with Marchelle Folks at [facility], she stated that they did not receive any testing in regards to the patient's nocturnal oxygen order that was placed yesterday. Explained to her that he had a HST done in July and his low O2 stats were discovered during that test. She explained that once a test for oxygen has been completed, the order will need to be placed within 30 days. Because it is well over 30 days, he will need a ONO on room air to qualify.   Called Barbara Cower at [facility] since this is where the original order was sent back in July (within the 30 days). He said the same thing, that because the test was done back in July, he will need an ONO on room air. Stated that [facility] will be more than happy to take him on as a patient. Just need an order for ONO.    Sarah, please advise if it is ok for [place] to order an ONO on this patient. Thanks.

## 2017-04-08 NOTE — Telephone Encounter (Signed)
Waiting on SG to advise if the ONO can be ordered.

## 2017-04-08 NOTE — Telephone Encounter (Signed)
Barbara Cower from [facility] called. States that pt wife called him yesterday regarding O2 and nebulizer. Barbara Cower that last he heard this was going to [place]. If we/patient wife wants [facility] to supply the O2 and nebulizer we need to staff message Barbara Cower so that he is aware and then have patient do an ONO to qualify patient. He can be reached at [phone]

## 2017-04-10 ENCOUNTER — Other Ambulatory Visit: Payer: Self-pay

## 2017-04-10 ENCOUNTER — Telehealth: Payer: Self-pay | Admitting: Acute Care

## 2017-04-10 DIAGNOSIS — J449 Chronic obstructive pulmonary disease, unspecified: Secondary | ICD-10-CM

## 2017-04-10 DIAGNOSIS — R918 Other nonspecific abnormal finding of lung field: Secondary | ICD-10-CM

## 2017-04-10 NOTE — Telephone Encounter (Signed)
Please order O&O. Thanks

## 2017-04-10 NOTE — Telephone Encounter (Signed)
ONO has been reordered. Nothing further needed.

## 2017-04-10 NOTE — Telephone Encounter (Signed)
ONO has been ordered.  Nothing further needed. 

## 2017-04-10 NOTE — Telephone Encounter (Signed)
SG please advise. Thanks   

## 2017-04-11 ENCOUNTER — Ambulatory Visit (INDEPENDENT_AMBULATORY_CARE_PROVIDER_SITE_OTHER)
Admission: RE | Admit: 2017-04-11 | Discharge: 2017-04-11 | Disposition: A | Discharge status: Home / Self Care | Payer: 59 | Source: Ambulatory Visit | Attending: Acute Care | Admitting: Acute Care

## 2017-04-11 ENCOUNTER — Telehealth: Payer: Self-pay | Admitting: Internal Medicine

## 2017-04-11 ENCOUNTER — Other Ambulatory Visit (HOSPITAL_COMMUNITY): Payer: Self-pay | Admitting: Interventional Radiology

## 2017-04-11 DIAGNOSIS — R05 Cough: Secondary | ICD-10-CM | POA: Diagnosis not present

## 2017-04-11 DIAGNOSIS — R918 Other nonspecific abnormal finding of lung field: Secondary | ICD-10-CM | POA: Diagnosis not present

## 2017-04-11 DIAGNOSIS — I729 Aneurysm of unspecified site: Secondary | ICD-10-CM

## 2017-04-11 NOTE — Telephone Encounter (Signed)
Spoke with pt's wife Derek Conner regarding her concern stating that pt has been having a hard time trying to get set up on O2. Pt had a sleep study and with the sleep study, Derek Conner stated that pt's O2 dropped dangerously low during the sleep study down to 85%. Pt and wife told [facility] to hold off on O2 and had another study done and O2 dropped to 85% for again and 49% of the time pt was less than 90% and pt's HR got as high as 117. Derek Conner told me that they were told an order needed to be placed before pt could be set up on O2, and I told her that the ONO order was placed yesterday, 04/10/17 for pt to have the ONO on room air and then from there we would be able to tell if pt needed to be placed on O2.  Told Derek Conner that once the ONO was done and we had the results from that, we would contact her know what the recommendations were for her husband.  Derek Conner expressed understanding. Nothing further needed at this time.

## 2017-04-11 NOTE — Telephone Encounter (Signed)
Spoke with pt's wife Rebecca regarding her concern stating that pt has been having a hard time trying to get set up on O2. Pt had a sleep study and with the sleep study, Rebecca stated that pt's O2 dropped dangerously low during the sleep study down to 85%. Pt and wife told [facility] to hold off on O2 and had another study done and O2 dropped to 85% for 11min again and 49% of the time pt was less than 90% and pt's HR got as high as 117. Rebecca told me that they were told an order needed to be placed before pt could be set up on O2, and I told her that the ONO order was placed yesterday, 04/10/17 for pt to have the ONO on room air and then from there we would be able to tell if pt needed to be placed on O2.  Told Rebecca that once the ONO was done and we had the results from that, we would contact her know what the recommendations were for her husband.  Rebecca expressed understanding. Nothing further needed at this time. 

## 2017-04-15 ENCOUNTER — Other Ambulatory Visit: Payer: Self-pay | Admitting: Medical

## 2017-04-15 MED FILL — SPIRIVA 18 MCG CP-HANDIHALE: 18 | 90 days supply | Qty: 90 | Fill #0

## 2017-04-15 MED FILL — ALBUTEROL 0.083% INHAL SOLN: (2.5 MG/3ML | 6 days supply | Qty: 75 | Fill #1

## 2017-04-15 MED FILL — FENOFIBRATE 48 MG TABLET: 48 | 30 days supply | Qty: 30 | Fill #0

## 2017-04-21 DIAGNOSIS — R0902 Hypoxemia: Secondary | ICD-10-CM | POA: Diagnosis not present

## 2017-04-21 DIAGNOSIS — J449 Chronic obstructive pulmonary disease, unspecified: Secondary | ICD-10-CM | POA: Diagnosis not present

## 2017-04-22 ENCOUNTER — Telehealth: Payer: Self-pay | Admitting: Acute Care

## 2017-04-22 DIAGNOSIS — J449 Chronic obstructive pulmonary disease, unspecified: Secondary | ICD-10-CM

## 2017-04-22 NOTE — Telephone Encounter (Signed)
Called and spoke with pt's spouse, Lurena Joiner. Lurena Joiner states per [facility] order was not received for neb machine.  Per SG OV note on 04/03/17-neb machine was to be ordered. It does not appear that order was placed.  I have apologized to Lurena Joiner for the delay. Order has been placed to [facility] for neb machine. Nothing further needed.

## 2017-04-23 DIAGNOSIS — J449 Chronic obstructive pulmonary disease, unspecified: Secondary | ICD-10-CM | POA: Diagnosis not present

## 2017-04-23 DIAGNOSIS — J441 Chronic obstructive pulmonary disease with (acute) exacerbation: Secondary | ICD-10-CM | POA: Diagnosis not present

## 2017-04-24 ENCOUNTER — Ambulatory Visit (INDEPENDENT_AMBULATORY_CARE_PROVIDER_SITE_OTHER)
Admission: RE | Admit: 2017-04-24 | Discharge: 2017-04-24 | Disposition: A | Discharge status: Home / Self Care | Payer: 59 | Source: Ambulatory Visit | Attending: Acute Care | Admitting: Acute Care

## 2017-04-24 ENCOUNTER — Ambulatory Visit (INDEPENDENT_AMBULATORY_CARE_PROVIDER_SITE_OTHER): Payer: 59 | Admitting: Acute Care

## 2017-04-24 ENCOUNTER — Encounter: Payer: Self-pay | Admitting: Acute Care

## 2017-04-24 VITALS — BP 142/88 | HR 93 | Ht 72.0 in | Wt 178.8 lb

## 2017-04-24 DIAGNOSIS — F17210 Nicotine dependence, cigarettes, uncomplicated: Secondary | ICD-10-CM

## 2017-04-24 DIAGNOSIS — J441 Chronic obstructive pulmonary disease with (acute) exacerbation: Secondary | ICD-10-CM

## 2017-04-24 DIAGNOSIS — J449 Chronic obstructive pulmonary disease, unspecified: Secondary | ICD-10-CM

## 2017-04-24 DIAGNOSIS — R05 Cough: Secondary | ICD-10-CM | POA: Diagnosis not present

## 2017-04-24 DIAGNOSIS — G4734 Idiopathic sleep related nonobstructive alveolar hypoventilation: Secondary | ICD-10-CM

## 2017-04-24 MED ORDER — LEVALBUTEROL HCL 0.63 MG/3ML IN NEBU
0.6300 mg | INHALATION_SOLUTION | Freq: Once | RESPIRATORY_TRACT | Status: AC
  Administered 2017-04-24: 0.63 mg via RESPIRATORY_TRACT

## 2017-04-24 NOTE — Assessment & Plan Note (Addendum)
Continues to smoke every day Discussed risks associated with continued tobacco abuse Plan: Counseled to quit smoking Counseled to not smoke while wearing oxygen Lung Cancer Screening CT due 08/2017 for a Lung Rads 3 reading 02/2017.

## 2017-04-24 NOTE — Assessment & Plan Note (Signed)
Awaiting home sleep study results before getting oxygen per [facility] Plan: We will follow up on you home sleep study. When you get your oxygen, wear it at bedtime at 2 L Derek Conner. Do not smoke while wearing oxygen Follow up in 2 weeks with Dr. Marchelle Gearing on 05/24/2017 as is scheduled.

## 2017-04-24 NOTE — Patient Instructions (Addendum)
It is nice to meet you today. Neb treatment today. CXR today. We will call you with results. Mucinex 1200 mg twice daily for chest congestion. Flutter valve 4 puffs 4 times a day. Continue Spiriva, Daliresp and Advair. Albuterol nebs in the am and pm until feeling better. Then go to as needed. Please work on quitting smoking. We will follow up on you home sleep study. When you get your oxygen, wear it at bedtime at 2 L [place]. Follow up in 2 weeks with Dr. Marchelle Gearing on 05/24/2017 as is scheduled. Lung Cancer Screening CT due 08/2017 for a Lung Rads 3 reading 02/2017. Please contact office for sooner follow up if symptoms do not improve or worsen or seek emergency care

## 2017-04-24 NOTE — Progress Notes (Addendum)
History of Present Illness Derek Conner is a 56 y.o. male current every day smoker with GOLD III copd as of 10/25/16 pPFT's , and pulmonary nodules. He is followed by Dr. Marchelle Conner.   Maintenance regimen: spiriva/daliresp/advair 500    04/24/2017 Follow Up OV: Pt. Presents for follow up of COPD Flare/ CAP. He states he completed his doxycycline and prednisone. He feels better.He states he continues to  wheezing and has  shortness of breath. He states he has very thick secretions that are hard to cough up. They are yellow brown and thick. He continues to smoke. We discussed the associated risks. He got his neb machine last night, but has not used it yet. There was a delay in the receipt of the order at his DME.Marland Kitchen He is compliant with his Spiriva, Daliresp, Advair and plans to start his neb treatments twice daily. Pt has had to  repeated his Home Sleep Study per his insurance for nocturnal oxygen. There was longer than a month between the original sleep study and the order for oxygen because the patient did not want to wear the nocturnal oxygen initially.Once this study has been read he should receive his oxygen.  He denies fever, chest pain, orthopnea or hemoptysis. He is leaving tomorrow morning for a cruise. Pt. Qualifies for [employer] CT Lung Nodule of greater than 6 mm. [facility] will attempt to consent him today. Protocol is to blood test and then following standard of care scans.  Test Results: 02/24/2017 Low Dose CT>> Lung  RADS 3, nodules that are probably benign findings, short term follow up suggested: includes nodules with a low likelihood of becoming a clinically active cancer. Radiology recommends a 6 month repeat LDCT follow up. Scan due 08/2017.  Home Sleep Study 04/21/2017>> SpO2<=88%: 02: 13:08                                                       SpO2<= 89% 03:45:12                                                       SpO2 >= 90%: 03:00:44                       SpO2< Average : 00:02:32  This result was faxed to the office 05/05/2017 This note was addended  05/06/2017 Confirms qualification for overnight oxygen.  CBC Latest Ref Rng & Units 10/20/2016 09/13/2016 05/18/2016  WBC 4.0 - 10.5 K/uL 13.8(H) 11.6(H) 14.4(H)  Hemoglobin 13.0 - 17.0 g/dL 10.2 58.5 12.4(L)  Hematocrit 39.0 - 52.0 % 40.0 43.0 37.7(L)  Platelets 150 - 400 K/uL 248 354.0 322    BMP Latest Ref Rng & Units 12/10/2016 10/20/2016 09/13/2016  Glucose 70 - 99 mg/dL 277(O) 242(P) 536(R)  BUN 6 - 23 mg/dL 15 15 18   Creatinine 0.40 - 1.50 mg/dL 4.43 1.54(M) 0.86  Sodium 135 - 145 mEq/L 139 135 139  Potassium 3.5 - 5.1 mEq/L 3.9 4.1 4.6  Chloride 96 - 112 mEq/L 107 103 105  CO2 19 - 32 mEq/L 26 23 26   Calcium 8.4 - 10.5 mg/dL 9.7 9.5 9.7  BNP    Component Value Date/Time   BNP 17.9 05/16/2016 1523    ProBNP No results found for: PROBNP  PFT    Component Value Date/Time   FEV1PRE 1.38 10/25/2016 1028   FEV1POST 1.10 02/15/2014 1202   FVCPRE 3.24 10/25/2016 1028   FVCPOST 3.10 02/15/2014 1202   TLC 9.36 02/15/2014 1202   DLCOUNC 24.75 10/25/2016 1028   PREFEV1FVCRT 43 10/25/2016 1028   PSTFEV1FVCRT 36 02/15/2014 1202    Dg Chest 2 View  Result Date: 04/11/2017 CLINICAL DATA:  F/u pna. Pt c/o SOB and cough. Hx of bronchitis, COPD, asthma, HTN, pneumothorax. Smoker. EXAM: CHEST  2 VIEW COMPARISON:  04/03/2017, CT 02/24/2017 FINDINGS: Lungs are hyperinflated. Normal cardiac silhouette. No effusion, infiltrate pneumothorax. Nodular densities in the Nodular densities in the LEFT upper lobe are unchanged in short interval. IMPRESSION: 1. No change in nodular density in LEFT upper lobe. 2. Hyperinflated lungs consistent with emphysema. 3. Recommend follow-up CT in 4 to 5 months from current month per recommendation of CT 02/24/2017. Electronically Signed   By: Derek Conner M.D.   On: 04/11/2017 09:51   Dg Chest 2 View  Result Date: 04/03/2017 CLINICAL  DATA:  Cough and congestion. EXAM: CHEST  2 VIEW COMPARISON:  CT 02/24/2017 . FINDINGS: Mediastinum and hilar structures are normal. Bilateral interstitial prominence again noted consistent with chronic interstitial lung disease. New infiltrate in the left upper lobe noted. This is consistent pneumonia. Active granulomas disease cannot be excluded . Follow-up chest x-rays suggested demonstrate resolution. Stable nodular densities are noted in the lungs. Reference is made to prior CT report of 02/24/2017. IMPRESSION: 1. New infiltrate left upper lobe. Findings suggest pneumonia. Active granulomas disease cannot be excluded . Close follow-up chest x-rays suggested to demonstrate clearing. 2. Bilateral interstitial changes in nodular densities are again noted unchanged from prior exams. Reference is made to prior CT report 02/24/2017 . Electronically Signed   By: Derek Fus  Conner   On: 04/03/2017 14:42     Past medical hx Past Medical History:  Diagnosis Date  . Asthma   . CAP (community acquired pneumonia) 01/19/2016  . Chronic bronchitis (HCC)   . COPD (chronic obstructive pulmonary disease) (HCC)   . Emphysema lung (HCC)   . Emphysema of lung (HCC)   . Hepatitis A infection ~ 1980  . High cholesterol   . Hypertension   . Pneumonia 1960s  . Rheumatoid arthritis (HCC)    "hands mostly"  ( 01/19/2016)  . Situational depression 2009-12-17   "only when my mother died"     Social History  Substance Use Topics  . Smoking status: Current Every Day Smoker    Packs/day: 1.00    Years: 40.00    Types: Cigarettes  . Smokeless tobacco: Never Used     Comment: 3 cigs per day 02/10/17   . Alcohol use 6.0 oz/week    10 Cans of beer per week     Comment: 12/04/2013 "2beers/day"    Derek Conner reports that he has been smoking Cigarettes.  He has a 40.00 pack-year smoking history. He has never used smokeless tobacco. He reports that he drinks about 6.0 oz of alcohol per week . He reports that he does not use  drugs.  Tobacco Cessation: Current Every day smoker. I have spent 3 minutes counseling patient on smoking cessation this visit. I have counseled patient extensively about not using oxygen while smoking and keeping oxygen away from open flame.  Past surgical hx, Family hx, Social  hx all reviewed.  Current Outpatient Prescriptions on File Prior to Visit  Medication Sig  . albuterol (PROVENTIL HFA;VENTOLIN HFA) 108 (90 Base) MCG/ACT inhaler Inhale 2 puffs into the lungs every 4 (four) hours as needed for wheezing or shortness of breath.  Marland Kitchen albuterol (PROVENTIL) (2.5 MG/3ML) 0.083% nebulizer solution Take 3 mLs (2.5 mg total) by nebulization every 6 (six) hours as needed for wheezing or shortness of breath. DX: J44.9  . ALPRAZolam (XANAX) 0.5 MG tablet 1 tab po q day as needed severe anxiety/panic attack.  Marland Kitchen amLODipine (NORVASC) 10 MG tablet TAKE 1 TABLET (10 MG) BY MOUTH DAILY.  Marland Kitchen aspirin EC 81 MG tablet Take 81 mg by mouth daily.  Marland Kitchen b complex vitamins tablet Take 1 tablet by mouth daily.  . cloNIDine (CATAPRES) 0.2 MG tablet Take 1 tablet (0.2 mg total) by mouth 2 (two) times daily.  . famotidine (PEPCID) 20 MG tablet TAKE 1 TABLET (20 MG TOTAL) BY MOUTH AT BEDTIME.  . fenofibrate (TRICOR) 48 MG tablet TAKE 1 TABLET (48 MG TOTAL) BY MOUTH DAILY.  . fluticasone (FLONASE) 50 MCG/ACT nasal spray Place 2 sprays into both nostrils daily.  . Fluticasone-Salmeterol (ADVAIR DISKUS) 500-50 MCG/DOSE AEPB Inhale 1 puff into the lungs 2 (two) times daily.  . metFORMIN (GLUCOPHAGE) 500 MG tablet 1 tablet by mouth daily  . nicotine (NICODERM CQ) 14 mg/24hr patch Place 1 patch (14 mg total) onto the skin daily.  . nicotine (NICOTROL) 10 MG inhaler Inhale 1 Cartridge (1 continuous puffing total) into the lungs as needed for smoking cessation.  . pantoprazole (PROTONIX) 40 MG tablet Take 1 tablet (40 mg total) by mouth daily at 12 noon.  . roflumilast (DALIRESP) 500 MCG TABS tablet Take 1 tablet (500 mcg  total) by mouth daily.  . ticagrelor (BRILINTA) 90 MG TABS tablet Take 45-90 mg by mouth 2 (two) times daily. 1 in the morning and 1/2 in the evening  . tiotropium (SPIRIVA HANDIHALER) 18 MCG inhalation capsule PLACE 1 CAPSULE INTO INHALER AND INHALE INTO LUNGS DAILY.   Current Facility-Administered Medications on File Prior to Visit  Medication  . 0.9 %  sodium chloride infusion  . aspirin EC tablet 325 mg  . niMODipine (NIMOTOP) capsule 0-60 mg     Allergies  Allergen Reactions  . Symbicort [Budesonide-Formoterol Fumarate] Shortness Of Breath, Nausea And Vomiting and Other (See Comments)    heachache  . Dulera [Mometasone Furo-Formoterol Fum] Other (See Comments)    Headache     Review Of Systems:  Constitutional:   No  weight loss, night sweats,  Fevers, chills, fatigue, or  lassitude.  HEENT:   No headaches,  Difficulty swallowing,  Tooth/dental problems, or  Sore throat,                No sneezing, itching, ear ache, nasal congestion, post nasal drip,   CV:  No chest pain,  Orthopnea, PND, swelling in lower extremities, anasarca, dizziness, palpitations, syncope.   GI  No heartburn, indigestion, abdominal pain, nausea, vomiting, diarrhea, change in bowel habits, loss of appetite, bloody stools.   Resp: + shortness of breath with exertion or at rest.  + excess mucus, + productive cough,  No non-productive cough,  No coughing up of blood.  + change in color of mucus.  + wheezing.  No chest wall deformity  Skin: no rash or lesions.  GU: no dysuria, change in color of urine, no urgency or frequency.  No flank pain, no hematuria  MS:  No joint pain or swelling.  No decreased range of motion.  No back pain.  Psych:  No change in mood or affect. No depression or anxiety.  No memory loss.   Vital Signs BP (!) 142/88 (BP Location: Left Arm, Cuff Size: Normal)   Pulse 93   Ht 6' (1.829 m)   Wt 178 lb 12.8 oz (81.1 kg)   SpO2 98%   BMI 24.25 kg/m    Physical  Exam:  General- No distress,  A&Ox3, pleasant ENT: No sinus tenderness, TM clear, pale nasal mucosa, no oral exudate,no post nasal drip, no LAN Cardiac: S1, S2, regular rate and rhythm, no murmur Chest: + wheeze/no  rales/ dullness; no accessory muscle use, no nasal flaring, no sternal retractions Abd.: Soft Non-tender, non-distended Ext: No clubbing cyanosis, edema Neuro:  normal strength Skin: No rashes, warm and dry Psych: normal mood and behavior   Assessment/Plan  COPD exacerbation (HCC) Resolving Better after Doxy and Pred taper Plan: CXR today. We will call you with results. Mucinex 1200 mg twice daily for chest congestion. Flutter valve 4 puffs 4 times a day. Continue Spiriva, Daliresp and Advair. Albuterol nebs in the am and pm until feeling better. Then go to as needed. Follow up in 05/2017 as is scheduled Please contact office for sooner follow up if symptoms do not improve or worsen or seek emergency care   Cigarette smoker Continues to smoke every day Discussed risks associated with continued tobacco abuse Plan: Counseled to quit smoking Counseled to not smoke while wearing oxygen Lung Cancer Screening CT due 08/2017 for a Lung Rads 3 reading 02/2017.   Nocturnal hypoxemia Awaiting home sleep study results before getting oxygen per [facility] Plan: We will follow up on you home sleep study. When you get your oxygen, wear it at bedtime at 2 L [place]. Do not smoke while wearing oxygen Follow up in 2 weeks with Dr. Marchelle Conner on 05/24/2017 as is scheduled.     Bevelyn Ngo, NP 04/24/2017  10:10 AM

## 2017-04-24 NOTE — Assessment & Plan Note (Signed)
Resolving Better after Doxy and Pred taper Plan: CXR today. We will call you with results. Mucinex 1200 mg twice daily for chest congestion. Flutter valve 4 puffs 4 times a day. Continue Spiriva, Daliresp and Advair. Albuterol nebs in the am and pm until feeling better. Then go to as needed. Follow up in 05/2017 as is scheduled Please contact office for sooner follow up if symptoms do not improve or worsen or seek emergency care

## 2017-05-05 ENCOUNTER — Telehealth: Payer: Self-pay | Admitting: Acute Care

## 2017-05-05 DIAGNOSIS — G4734 Idiopathic sleep related nonobstructive alveolar hypoventilation: Secondary | ICD-10-CM

## 2017-05-05 NOTE — Telephone Encounter (Signed)
Notes recorded by Bevelyn Ngo, NP on 04/25/2017 at 6:00 PM EDT Please call patient and let him know that his chest x-ray was stable. Remind patient that he will need his repeat CT chest in January 2018. Please have him follow plan of care developed in the office the other day. Thank you ---------------------- Spoke with pt, aware of cxr results.  Pt is also requesting ONO results.  I do not see results scanned into chart, and no results in SG's look-at.    Called and spoke with Barbara Cower at [facility], states he will have ONO results faxed to our office at [facility] : [facility] attn.  Will await fax.

## 2017-05-06 NOTE — Telephone Encounter (Signed)
Spoke with pt, aware of results/recs.  Nocturnal O2 ordered.  Nothing further needed.

## 2017-05-06 NOTE — Telephone Encounter (Signed)
Per the O&O patient had sats of < 88% for 2 hours 13 minutes 08 seconds. This qualifies him for home oxygen. Please place order for home oxygen at 2 L [place] to be worn each night at bedtime. Thanks so much.

## 2017-05-06 NOTE — Telephone Encounter (Signed)
ONO report has been received. Report is located in SG's look at.  SG - please advise. Thanks.

## 2017-05-07 ENCOUNTER — Telehealth: Payer: Self-pay | Admitting: Acute Care

## 2017-05-07 DIAGNOSIS — J449 Chronic obstructive pulmonary disease, unspecified: Secondary | ICD-10-CM | POA: Diagnosis not present

## 2017-05-07 NOTE — Telephone Encounter (Signed)
Called and spoke to [place] they have the order and he is finishing up on it and will contact the patient

## 2017-05-07 NOTE — Telephone Encounter (Signed)
Spoke with pt's spouse, made aware of Chan's documentation.  Nothing further needed.

## 2017-05-08 ENCOUNTER — Telehealth: Payer: Self-pay | Admitting: Internal Medicine

## 2017-05-08 MED FILL — DALIRESP 500 MCG TABLET: 500 | 30 days supply | Qty: 30 | Fill #3

## 2017-05-08 NOTE — Progress Notes (Signed)
.[email or web address]  Title: Blood Sample Collection in Subjects with Pulmonary Nodules or CT Suspicion of Lung  Cancer  Study Number: 2016-01; Protocol: Version 4.0, Amendment 3.0 Date: 06Jun2017  Sponsor: [employer] [address] [place] [address]  Principal Investigator: Dr. Chilton Greathouse ; Sub Investigators: Dr. Kalman Shan, Dr. Max Fickle  Synopsis: This is multi-site, sample collection study.The study is to obtain de-identified, clinically characterized, whole blood specimens for use in assessing new biomarkers for the detection of neoplasms off the lung.  The study will sample blood (32mL) from approximately 2250 subject; 1000 will have CT suspicion of lung cancer which is ultimately diagnosed as lung cancer and approximately 1000 subjects will have pulmonary nodules greater than or equal to 4 mm .  Total Number of Subjects in Trial: 2250   CT suspicion of lung Cancer ultimately diagnosed  CT suspicion of lung Cancer ultimately benign Pulmonary  Nodules Greater than/equal to 4 mm  Pulmonary Nodules Greater than/equal to 15 mm Pulmonary Nodules Greater than/equal to 10-15 mm  Greater than/equal to 4-9 mm        Number of Subjects 1000 250 1000 100 100 Remaining      Key Inclusion:  Suspicion of Cancer Subjects:  Subject is male or male, 57-12 years of age, inclusive  Subject has CT suspicion of lung cancer and is scheduled for biopsy or other diagnostic procedure.  Pulmonary Nodule Subjects:  Subject is male or male, 57-74 years of age, inclusive  Subject has a recent (within 90 days of enrollment) CT radiological diagnosis of pulmonary nodule(s) greater than equal to 4 mm without a scheduled biopsy/diagnostic procedure.   All Subjects:  Subject understands the study procedures and is able to provide informed consent to participate in the study and authorization for release of relevant protected health information to the study investigator.  Key  Exclusion:  CT with IV contrast within 1 day (or 24 hours) of blood collection  Prior history of cancer with the exception of non melanoma skin cancer and most in-situ carcinomas.  Prior removal of the lung, excluding percutaneous lung biopsy.  Any cytotoxic therapy including chemotherapy and radiation therapy.           Key Features: Lung cancer is now the most common cause of cancer death among men and women.  Key Endpoints: Using whole blood specimens for use in assessing new biomarkers for detection of neoplasms of the lung and for a biorepository for future cancer-related diagnostic test development.  Safety of [employer]: Routine phlebotomy risks of minimal transient pain and possible hematoma formation  Fleischner Society Guidelines for incidental pulmonary nodule follow up:   Nodule Size(mm) Low- Risk Patient High Risk Patient  ? 4 No Follow-up Needed Follow-up at 12 mo; if unchanged,no further follow up   > 4-6 Follow-up CT at 12 months;   if unchanged, no further follow up  Initial follow up CT at 6-12 mo;then at 18-24 months if no change  > 6-8  Initial follow-up CT at 6-12 months then at 18-24 months if no change       Initial follow-up CT at 3-88mos then at 9-12 and 24 months if no change  > 8 Follow-up CT at around 3, 9, and 24 months, dynamic contrast- enhanced CT, PET, and/or biopsy Same as for low-risk patient      Clinical Research Coordinator / Research RN note : This visit for Subject Derek Conner with DOB: 12-31-1960 on 05/08/2017 for the above protocol for screening and enrollment  and is for purpose of research. The consent for this encounter is under Protocol Version is currently IRB approved.The subject has expressed continued interest and consent in continuing as a study subject. Subject confirmed that there was  no change in contact information (e.g. address, telephone, email). Subject thanked for participation in research and contribution to science.   In  this visit 05/08/2017 the subject will be evaluated by sub investigator named Dr.Ramaswamy. This research coordinator has verified that this sub investigator is  uptodate with his training logs.   Because this visit is a key visit of screening/enrollment this visit is under direct supervision of the Sub PI.   The PI is not available due to schedule issues, the sub-I reported and CRC has confirmed that the PI, Dr.Mannam has discussed the visit a-priori with the sub-investigator.  The CRC has confirmed that the note will be routed to PI by sub-I if possible   Signed by  Tresa Moore Clinical Research Coordinator / Nurse [facility]  [place], [place] 2:31 PM 05/08/2017

## 2017-05-08 NOTE — Telephone Encounter (Signed)
Derek Conner  called into research - since 05/05/17 has increaed cough, congestion and wheze. No thrush.   Plan Please take Take prednisone 40mg  once daily x 3 days, then 30mg  once daily x 3 days, then 20mg  once daily x 3 days, then prednisone 10mg  once daily  x 3 days and stop  Take doxycycline 100mg  po twice daily x 5 days; take after meals and avoid sunlight - check with him though if he is ok with this antibiotic  Allergies  Allergen Reactions  . Symbicort [Budesonide-Formoterol Fumarate] Shortness Of Breath, Nausea And Vomiting and Other (See Comments)    heachache  . Dulera [Mometasone Furo-Formoterol Fum] Other (See Comments)    Headache    Dr. , M.D., [facility].C.P Pulmonary and Critical Care Medicine Staff Physician [facility] Pager: [phone], If no answer or between  15:00h - 7:00h: call [phone]  05/08/2017 9:39 AM

## 2017-05-09 ENCOUNTER — Ambulatory Visit (HOSPITAL_COMMUNITY)
Admission: RE | Admit: 2017-05-09 | Discharge: 2017-05-09 | Disposition: A | Discharge status: Home / Self Care | Payer: 59 | Source: Ambulatory Visit | Attending: Interventional Radiology | Admitting: Interventional Radiology

## 2017-05-09 ENCOUNTER — Encounter (HOSPITAL_COMMUNITY): Payer: Self-pay

## 2017-05-09 ENCOUNTER — Ambulatory Visit (HOSPITAL_COMMUNITY): Admission: RE | Admit: 2017-05-09 | Payer: 59 | Source: Ambulatory Visit

## 2017-05-09 DIAGNOSIS — I729 Aneurysm of unspecified site: Secondary | ICD-10-CM

## 2017-05-09 DIAGNOSIS — I671 Cerebral aneurysm, nonruptured: Secondary | ICD-10-CM | POA: Diagnosis not present

## 2017-05-09 LAB — CREATININE, SERUM: Creatinine, Ser: 1.29 mg/dL — ABNORMAL HIGH (ref 0.61–1.24)

## 2017-05-09 MED ORDER — DOXYCYCLINE HYCLATE 100 MG PO TABS: 100.0000 mg | ORAL_TABLET | Freq: Two times a day (BID) | ORAL | 0 refills | Status: DC

## 2017-05-09 MED ORDER — GADOBENATE DIMEGLUMINE 529 MG/ML IV SOLN
17.0000 mL | Freq: Once | INTRAVENOUS | Status: AC | PRN
  Administered 2017-05-09: 17 mL via INTRAVENOUS

## 2017-05-09 MED ORDER — PREDNISONE 10 MG PO TABS: ORAL_TABLET | ORAL | 0 refills | Status: DC

## 2017-05-09 MED FILL — predniSONE 10 MG TABS: 10 | 12 days supply | Qty: 30 | Fill #0

## 2017-05-09 MED FILL — DOXYCYCLINE HYCLATE 100 MG: 100 | 5 days supply | Qty: 10 | Fill #0

## 2017-05-09 NOTE — Telephone Encounter (Signed)
Spoke with pat and advised medications would be called into the pharmacy. Pt understood and nothing further is needed.

## 2017-05-09 NOTE — Telephone Encounter (Signed)
Pt called, medications (Prednisone and Doxycyline) was not available for pick up from [facility], pt contact # [phone]

## 2017-05-19 ENCOUNTER — Telehealth: Payer: Self-pay | Admitting: Internal Medicine

## 2017-05-19 ENCOUNTER — Other Ambulatory Visit: Payer: Self-pay | Admitting: Family Medicine

## 2017-05-19 DIAGNOSIS — J441 Chronic obstructive pulmonary disease with (acute) exacerbation: Secondary | ICD-10-CM

## 2017-05-19 MED ORDER — ALBUTEROL SULFATE HFA 108 (90 BASE) MCG/ACT IN AERS: INHALATION_SPRAY | RESPIRATORY_TRACT | 1 refills | Status: DC

## 2017-05-19 MED FILL — FENOFIBRATE 48 MG TABLET: 48 | 30 days supply | Qty: 30 | Fill #1

## 2017-05-19 MED FILL — VENTOLIN HFA 90 MCG INHALER: 108 (90 BAS | 25 days supply | Qty: 18 | Fill #0

## 2017-05-19 NOTE — Telephone Encounter (Signed)
Pt's spouse requesting refill on ventolin.  This has been sent to preferred pharmacy.  Nothing further needed.

## 2017-05-20 ENCOUNTER — Telehealth (HOSPITAL_COMMUNITY): Payer: Self-pay

## 2017-05-20 NOTE — Telephone Encounter (Signed)
Pt agreed to f/u in 6 months with angiogram. AW 

## 2017-05-23 ENCOUNTER — Encounter: Payer: Self-pay | Admitting: Internal Medicine

## 2017-05-23 ENCOUNTER — Ambulatory Visit (INDEPENDENT_AMBULATORY_CARE_PROVIDER_SITE_OTHER): Payer: 59 | Admitting: Internal Medicine

## 2017-05-23 VITALS — BP 120/70 | HR 92 | Ht 72.0 in | Wt 179.0 lb

## 2017-05-23 DIAGNOSIS — J449 Chronic obstructive pulmonary disease, unspecified: Secondary | ICD-10-CM | POA: Diagnosis not present

## 2017-05-23 DIAGNOSIS — F17210 Nicotine dependence, cigarettes, uncomplicated: Secondary | ICD-10-CM | POA: Diagnosis not present

## 2017-05-23 DIAGNOSIS — R911 Solitary pulmonary nodule: Secondary | ICD-10-CM | POA: Diagnosis not present

## 2017-05-23 NOTE — Patient Instructions (Signed)
ICD-10-CM   1. COPD, severe (HCC) J44.9   2. Cigarette smoker F17.210   3. Nodule of right lung R91.1     Stable copd Glad you are better after flare up 05/08/17 No wheeze today 05/23/2017  Plan Continue spiriva and advair and daliresp as before Quit smoking when you can Respect flu shot decline Do repeat ct chest wo contrast in 6 months from July 2018 followup nodule  Followup After ct scan chest - around [facility] 2019

## 2017-05-23 NOTE — Progress Notes (Signed)
Subjective:     Patient ID: Derek Conner, male   DOB: [birth year], 56 y.o.   MRN: [id number]  HPI  56 yo Native American/ smoker GOLD III copd as of 10/25/16 pfts on a typical day p taking spiriva/daliresp/advair 500     OV 07/24/2016  Chief Complaint  Patient presents with  . Follow-up    Pt states overall his breathing is doing well. Pt states he has an occassional prod cough with clear to white mucus. Pt denies CP/tightness and f/c/s.     [name] with smoking, pulmonary nodules in September 2016, frequent COPD exacerbation and severe COPD based on 2000 1550  Admitted 05/16/2016 for COPD exacerbation. After that he followed up with my colleague and was doing well. Since then he's continued to do well. His COPD stable. He presents with his wife/girlfriend who is a Engineer, civil (consulting) and 60s at [facility] [employer] [facility] [employer]. He continues to refuse vaccines. He is taking triple inhaler therapy and also Roflumilast. He has lost 25 pounds due to dietary changes and it is intentional. He feels overall well. He continues to smoke a little bit and he knows the importance of quitting. Last pulmonary nodule was summer 2016 on CT chest. Chest x-rays this you have been clear.  11/15/2016 acute extended ov/Wert re: aecopd / active smoker Chief Complaint  Patient presents with  . Acute Visit    Pt c/o sinus pressure, HA, wheezing, increased SOB and cough for the past 4 days. Cough is prod with moderate amounts of green to yellow sputum. He is using his albuterol inhaler 3-4 x per day.   onset 11/12/16 flared on maint rx =  spiriva/daliresp/advair 500  plus on alb early p afternoon typically needs at least once daily    recently completed taper off pred / using saba now up to 4 x daily and noct but typically comfortable at rest p saba rx  Says can't take dulera or symbicort  Cough is worse at hs and in am and thinks this triggered the sob/ wheeze with lost of nasal congestion/obst symptoms  No obvious patterns in  day to day or daytime variability or assoc mucus plugs or hemoptysis or cp or chest tightness, subjective wheeze or overt   hb symptoms. No unusual exp hx or h/o childhood pna/ asthma or knowledge of premature birth.   Also denies any obvious fluctuation of symptoms with weather or environmental changes or other aggravating or alleviating factors except as outlined above     01/09/2017 2 week Follow Up Appointment Pt. Presents for follow up. He was seen 12/13/2015 for COPD exacerbation. He was treated with Doxycycline and a prednisone taper.He states his cough is better. He does still have secretions that are white to foamy.He no longer has purulent secretions. He is continuing to smoke. He states he is still short of breath.We discussed the shortness of breath and the correlation with smoking. We discussed the use of NicoDerm patches to assist with his attempt at smoking cessation. He states he has used these in the past. He cannot tolerate the 21 mg patch, so wants to use the 14 mg patches. I explained that I will prescribe Nicotrol inhaler for breakthrough if needed. We spent a long time discussing the need to quit smoking, as it is now affecting his ability to breathe. Patient denies fever, chest pain, orthopnea, or hemoptysis. He does complain of continued dyspnea on exertion. He is compliant with his Spiriva, Advair, and Daliresp. Last low-dose lung cancer screening CT  was read as a Lung RADS 4 A : suspicious findings, either short term follow up in 3 months or alternatively .3 month Follow up CT is due to July 2018.    Test Results:  Low Dose Lung Cancer Screening Follow Up CT:11/22/2016 No pleural effusion. Moderate changes of centrilobular emphysema noted. Diffuse bronchial wall thickening identified. Chronic scarring and architectural distortion within the posterior right upper lobe is again identified and appears similar to previous exam. Multiple pulmonary nodular densities are again  identified in both lungs. Within the left upper lobe there is an enlarging nodule which has an equivalent diameter of 7.8 mm,. On the previous exam this nodule was present with an equivalent diameter of 2.2 mm.   OV 02/10/2017  Chief Complaint  Patient presents with  . Follow-up    Pt c/o increase in SOB, prod cough with white mucus, chest tightness x 3 days. Pt denies f/c/s.    Follow-up  #Advanced COPD: He is here with his wife. I did do an FMLA form for his wife in the last few days. He tells me that he is in the midst of an exacerbation. Overall in 2018 he has not had any admissions. He and his wife believe that the strategy of coming to the office or calling during an exacerbation has prevented admissions. 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 He thinks  Working hard and exposure to carpets makes his copd flare up. He is allergic to breo and dulera and does not want to change his spiriva and advair as as a result. He ison daliresp and is working well. He DOES NOT think he is ready for disability. Says he loves work  #lung nodule - left side has growin in June 2018. Has fu CT February 21, 2017  #Smoking - stil does  OV 05/23/2017  Chief Complaint  Patient presents with  . Follow-up    Pt states that he has been doing good. Does still have some chest congestion and occ. cough with yellow mucus. Denies any SOB or CP.    Follow-up  Advanced COPD:Last saw him 05/08/2017 at the time of research nodule study. At that time he had COPD exacerbation. We'll give him antibodies and prednisone. Currently he is back to baseline. 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 He takes Advair and Spiriva and this works well for him. He is also on Daliresp. He still smokes and will not quit. He does not want a flu shot. In terms of his  Lung nodule: He is a repeat CT scan of the chest6 months from July 2018    has a past medical history of  Asthma; CAP (community acquired pneumonia) (01/19/2016); Chronic bronchitis (HCC); COPD (chronic obstructive pulmonary disease) (HCC); Emphysema lung (HCC); Emphysema of lung (HCC); Hepatitis A infection (~ 1980); High cholesterol; Hypertension; Pneumonia (1960s); Rheumatoid arthritis (HCC); and Situational depression (2011).   reports that he has been smoking Cigarettes.  He has a 40.00 pack-year smoking history. He has never used smokeless tobacco.  Past Surgical History:  Procedure Laterality Date  . APPENDECTOMY  1980's  . CHEST TUBE INSERTION Left 1990's   "lung collapsed"  . CYSTECTOMY Left 1960's   "wrist"  . FOOT FRACTURE SURGERY Left ~ 2005   "it was crushed"  . IR GENERIC HISTORICAL  03/27/2016   IR ANGIOGRAM FOLLOW UP STUDY 03/27/2016 Julieanne Cotton, MD [facility]  . IR GENERIC HISTORICAL  03/27/2016  IR ANGIOGRAM FOLLOW UP STUDY 03/27/2016 Julieanne Cotton, MD [facility]  . IR GENERIC HISTORICAL  03/27/2016   IR ANGIO INTRA EXTRACRAN SEL COM CAROTID INNOMINATE UNI L MOD SED 03/27/2016 Julieanne Cotton, MD [facility]  . IR GENERIC HISTORICAL  03/27/2016   IR ANGIOGRAM FOLLOW UP STUDY 03/27/2016 Julieanne Cotton, MD [facility]  . IR GENERIC HISTORICAL  03/27/2016   IR ANGIO VERTEBRAL SEL SUBCLAVIAN INNOMINATE UNI R MOD SED 03/27/2016 Julieanne Cotton, MD [facility]  . IR GENERIC HISTORICAL  03/27/2016   IR 3D INDEPENDENT WKST 03/27/2016 Julieanne Cotton, MD [facility]  . IR GENERIC HISTORICAL  03/27/2016   IR ANGIOGRAM FOLLOW UP STUDY 03/27/2016 Julieanne Cotton, MD [facility]  . IR GENERIC HISTORICAL  03/27/2016   IR NEURO EACH ADD'L AFTER BASIC UNI RIGHT (MS) 03/27/2016 Julieanne Cotton, MD [facility]  . IR GENERIC HISTORICAL  03/27/2016   IR ANGIOGRAM FOLLOW UP STUDY 03/27/2016 Julieanne Cotton, MD [facility]  . IR GENERIC HISTORICAL  03/27/2016   IR TRANSCATH/EMBOLIZ 03/27/2016 Julieanne Cotton, MD [facility]  . IR GENERIC HISTORICAL  03/27/2016    IR ANGIO INTRA EXTRACRAN SEL INTERNAL CAROTID UNI R MOD SED 03/27/2016 Julieanne Cotton, MD [facility]  . IR GENERIC HISTORICAL  03/27/2016   IR ANGIOGRAM FOLLOW UP STUDY 03/27/2016 Julieanne Cotton, MD [facility]  . IR GENERIC HISTORICAL  04/12/2016   IR RADIOLOGIST EVAL & MGMT 04/12/2016 [facility]  . RADIOLOGY WITH ANESTHESIA N/A 02/29/2016   Procedure: EMBOLIZATION     (RADIOLOGY WITH ANESTHESIA);  Surgeon: Julieanne Cotton, MD;  Location: [facility] OR;  Service: Radiology;  Laterality: N/A;  . RADIOLOGY WITH ANESTHESIA N/A 03/27/2016   Procedure: Embolization;  Surgeon: Julieanne Cotton, MD;  Location: [facility] OR;  Service: Radiology;  Laterality: N/A;  . VIDEO BRONCHOSCOPY Bilateral 12/06/2013   Procedure: VIDEO BRONCHOSCOPY WITHOUT FLUORO;  Surgeon: Alyson Reedy, MD;  Location: [facility] ENDOSCOPY;  Service: Cardiopulmonary;  Laterality: Bilateral;    Allergies  Allergen Reactions  . Symbicort [Budesonide-Formoterol Fumarate] Shortness Of Breath, Nausea And Vomiting and Other (See Comments)    heachache  . Dulera [Mometasone Furo-Formoterol Fum] Other (See Comments)    Headache     Immunization History  Administered Date(s) Administered  . Tdap 08/29/2015    Family History  Problem Relation Age of Onset  . Cancer Mother   . Arthritis Mother   . Stroke Father   . Hypertension Father   . Colon cancer Neg Hx      Current Outpatient Prescriptions:  .  albuterol (PROVENTIL HFA;VENTOLIN HFA) 108 (90 Base) MCG/ACT inhaler, Inhale 2 puffs into the lungs every 4 (four) hours as needed for wheezing or shortness of breath., Disp: , Rfl:  .  albuterol (PROVENTIL) (2.5 MG/3ML) 0.083% nebulizer solution, Take 3 mLs (2.5 mg total) by nebulization every 6 (six) hours as needed for wheezing or shortness of breath. DX: J44.9, Disp: 75 mL, Rfl: 12 .  ALPRAZolam (XANAX) 0.5 MG tablet, 1 tab po q day as needed severe anxiety/panic attack., Disp: 30 tablet, Rfl: 0 .  amLODipine (NORVASC) 10 MG tablet,  TAKE 1 TABLET (10 MG) BY MOUTH DAILY., Disp: 90 tablet, Rfl: 3 .  aspirin EC 81 MG tablet, Take 81 mg by mouth daily., Disp: , Rfl:  .  b complex vitamins tablet, Take 1 tablet by mouth daily., Disp: , Rfl:  .  cloNIDine (CATAPRES) 0.2 MG tablet, Take 1 tablet (0.2 mg total) by mouth 2 (two) times daily., Disp: 60  tablet, Rfl: 11 .  famotidine (PEPCID) 20 MG tablet, TAKE 1 TABLET (20 MG TOTAL) BY MOUTH AT BEDTIME., Disp: 90 tablet, Rfl: 1 .  fenofibrate (TRICOR) 48 MG tablet, TAKE 1 TABLET (48 MG TOTAL) BY MOUTH DAILY., Disp: 30 tablet, Rfl: 3 .  fluticasone (FLONASE) 50 MCG/ACT nasal spray, Place 2 sprays into both nostrils daily., Disp: 16 g, Rfl: 2 .  Fluticasone-Salmeterol (ADVAIR DISKUS) 500-50 MCG/DOSE AEPB, Inhale 1 puff into the lungs 2 (two) times daily., Disp: 60 each, Rfl: 3 .  pantoprazole (PROTONIX) 40 MG tablet, Take 1 tablet (40 mg total) by mouth daily at 12 noon., Disp: 30 tablet, Rfl: 3 .  roflumilast (DALIRESP) 500 MCG TABS tablet, Take 1 tablet (500 mcg total) by mouth daily., Disp: 30 tablet, Rfl: 3 .  ticagrelor (BRILINTA) 90 MG TABS tablet, Take 45-90 mg by mouth 2 (two) times daily. 1 in the morning and 1/2 in the evening, Disp: , Rfl:  .  tiotropium (SPIRIVA HANDIHALER) 18 MCG inhalation capsule, PLACE 1 CAPSULE INTO INHALER AND INHALE INTO LUNGS DAILY., Disp: 90 capsule, Rfl: 3 No current facility-administered medications for this visit.   Facility-Administered Medications Ordered in Other Visits:  .  0.9 %  sodium chloride infusion, , Intravenous, Continuous, Ralene Muskrat, PA-C .  aspirin EC tablet 325 mg, 325 mg, Oral, 60 min Pre-Op, Turpin, Pamela, PA-C .  niMODipine (NIMOTOP) capsule 0-60 mg, 0-60 mg, Oral, 60 min Pre-Op, Ralene Muskrat, PA-C    Review of Systems     Objective:   Physical Exam  Constitutional: He is oriented to person, place, and time. He appears well-developed and well-nourished. No distress.  HENT:  Head: Normocephalic and atraumatic.   Right Ear: External ear normal.  Left Ear: External ear normal.  Mouth/Throat: Oropharynx is clear and moist. No oropharyngeal exudate.  Eyes: Pupils are equal, round, and reactive to light. Conjunctivae and EOM are normal. Right eye exhibits no discharge. Left eye exhibits no discharge. No scleral icterus.  Neck: Normal range of motion. Neck supple. No JVD present. No tracheal deviation present. No thyromegaly present.  Cardiovascular: Normal rate, regular rhythm and intact distal pulses.  Exam reveals no gallop and no friction rub.   No murmur heard. Pulmonary/Chest: Effort normal and breath sounds normal. No respiratory distress. He has no wheezes. He has no rales. He exhibits no tenderness.  Abdominal: Soft. Bowel sounds are normal. He exhibits no distension and no mass. There is no tenderness. There is no rebound and no guarding.  Musculoskeletal: Normal range of motion. He exhibits no edema or tenderness.  Lymphadenopathy:    He has no cervical adenopathy.  Neurological: He is alert and oriented to person, place, and time. He has normal reflexes. No cranial nerve deficit. Coordination normal.  Skin: Skin is warm and dry. No rash noted. He is not diaphoretic. No erythema. No pallor.  Psychiatric: He has a normal mood and affect. His behavior is normal. Judgment and thought content normal.  Nursing note and vitals reviewed.  Vitals:   05/23/17 0934  BP: 120/70  Pulse: 92  SpO2: 98%  Weight: 179 lb (81.2 kg)  Height: 6' (1.829 m)    Estimated body mass index is 24.28 kg/m as calculated from the following:   Height as of this encounter: 6' (1.829 m).   Weight as of this encounter: 179 lb (81.2 kg).     Assessment:       ICD-10-CM   1. COPD, severe (HCC) J44.9   2.  Cigarette smoker F17.210   3. Nodule of right lung R91.1        Plan:      Stable copd Glad you are better after flare up 05/08/17 No wheeze today 05/23/2017  Plan Continue spiriva and advair and daliresp  as before Quit smoking when you can Respect flu shot decline Do repeat ct chest wo contrast in 6 months from July 2018 followup nodule  Followup After ct scan chest - around jan 2019     Dr. Kalman ShanMurali Robbie Nangle, M.D., [facility]P Pulmonary and Critical Care Medicine Staff Physician [facility] Pager: [phone], If no answer or between  15:00h - 7:00h: call [phone]  05/23/2017 9:57 AM

## 2017-05-26 ENCOUNTER — Ambulatory Visit (INDEPENDENT_AMBULATORY_CARE_PROVIDER_SITE_OTHER): Payer: 59 | Admitting: Medical

## 2017-05-26 ENCOUNTER — Encounter: Payer: Self-pay | Admitting: Medical

## 2017-05-26 ENCOUNTER — Ambulatory Visit (HOSPITAL_BASED_OUTPATIENT_CLINIC_OR_DEPARTMENT_OTHER)
Admission: RE | Admit: 2017-05-26 | Discharge: 2017-05-26 | Disposition: A | Discharge status: Home / Self Care | Payer: 59 | Source: Ambulatory Visit | Attending: Medical | Admitting: Medical

## 2017-05-26 VITALS — BP 135/80 | HR 80 | Temp 97.8°F | Resp 16 | Ht 72.0 in | Wt 185.0 lb

## 2017-05-26 DIAGNOSIS — M898X8 Other specified disorders of bone, other site: Secondary | ICD-10-CM | POA: Insufficient documentation

## 2017-05-26 DIAGNOSIS — M25551 Pain in right hip: Secondary | ICD-10-CM

## 2017-05-26 MED ORDER — DICLOFENAC SODIUM 50 MG PO TBEC: 50.0000 mg | DELAYED_RELEASE_TABLET | Freq: Two times a day (BID) | ORAL | 0 refills | Status: DC

## 2017-05-26 MED ORDER — HYDROCODONE-ACETAMINOPHEN 5-325 MG PO TABS: 1.0000 | ORAL_TABLET | Freq: Four times a day (QID) | ORAL | 0 refills | Status: DC | PRN

## 2017-05-26 MED ORDER — CYCLOBENZAPRINE HCL 10 MG PO TABS: 10.0000 mg | ORAL_TABLET | Freq: Every day | ORAL | 0 refills | Status: DC

## 2017-05-26 MED FILL — CYCLOBENZAPRINE 10 MG TAB: 10 | 10 days supply | Qty: 10 | Fill #0

## 2017-05-26 MED FILL — HYDROCODON-APAP 5-325: 5-325 | 3 days supply | Qty: 12 | Fill #0

## 2017-05-26 MED FILL — DICLOFENAC SOD EC 50 MG TAB: 50 | 10 days supply | Qty: 20 | Fill #0

## 2017-05-26 NOTE — Patient Instructions (Addendum)
For your right hip pain and pelvic bone area pain, we will get x-rays today. Pain may be joint region or muscular. Possibly both are cause of pain.  Will prescribe diclofenac for pain and inflammation. Flexeril muscle relaxant to use at night. Norco pain medication that should number provided for breakthrough pain. 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  Depending on what x-ray show and  how you're doing might refer to orthopedist or sports medicine.  Follow-up date to be determined after x-ray review.

## 2017-05-26 NOTE — Progress Notes (Signed)
Subjective:    Patient ID: Derek Conner, male    DOB: 02-24-1961, 56 y.o.   MRN: [id number]  HPI   Pt in for some rt hip area pain since last 12/07/2022. No fall or injury. Pt still active at work.  Pt taking tylenol for pain. Pt pain at times 8.5-9/10 with movment. At rest low level pain. But at times severe when he changes position. When he puts pressure on rt leg pain increases. When he lifts his rt leg pain increases.   At rest sitting dull 3/10 level pain.    Review of Systems  Constitutional: Negative for chills and fatigue.  Respiratory: Negative for cough, chest tightness, shortness of breath and wheezing.   Cardiovascular: Negative for chest pain and palpitations.  Musculoskeletal: Negative for back pain, joint swelling, myalgias, neck pain and neck stiffness.       Rt side iliac crest area pain. Posterior aspect. Some rt hip pain. Most of pain iliac crest area.  Hematological: Negative for adenopathy. Does not bruise/bleed easily.  Psychiatric/Behavioral: Negative for behavioral problems and confusion.    Past Medical History:  Diagnosis Date  . Asthma   . CAP (community acquired pneumonia) 01/19/2016  . Chronic bronchitis (HCC)   . COPD (chronic obstructive pulmonary disease) (HCC)   . Emphysema lung (HCC)   . Emphysema of lung (HCC)   . Hepatitis A infection ~ 1980  . High cholesterol   . Hypertension   . Pneumonia 1960s  . Rheumatoid arthritis (HCC)    "hands mostly"  ( 01/19/2016)  . Situational depression 2009-12-06   "only when my mother died"     Social History   Social History  . Marital status: Married    Spouse name: N/A  . Number of children: N/A  . Years of education: N/A   Occupational History  . Not on file.   Social History Main Topics  . Smoking status: Current Every Day Smoker    Packs/day: 1.00    Years: 40.00    Types: Cigarettes  . Smokeless tobacco: Never Used     Comment: smoking 1/2ppd as of 05/23/17  ep  . Alcohol use 6.0  oz/week    10 Cans of beer per week     Comment: 12/04/2013 "2beers/day"  . Drug use: No     Comment: 01/19/2016 "nothing in the last 3 months"  . Sexual activity: Yes   Other Topics Concern  . Not on file   Social History Narrative  . No narrative on file    Past Surgical History:  Procedure Laterality Date  . APPENDECTOMY  1980's  . CHEST TUBE INSERTION Left 1990's   "lung collapsed"  . CYSTECTOMY Left 1960's   "wrist"  . FOOT FRACTURE SURGERY Left ~ 12-07-03   "it was crushed"  . IR GENERIC HISTORICAL  03/27/2016   IR ANGIOGRAM FOLLOW UP STUDY 03/27/2016 Julieanne Cotton, MD [facility]  . IR GENERIC HISTORICAL  03/27/2016   IR ANGIOGRAM FOLLOW UP STUDY 03/27/2016 Julieanne Cotton, MD [facility]  . IR GENERIC HISTORICAL  03/27/2016   IR ANGIO INTRA EXTRACRAN SEL COM CAROTID INNOMINATE UNI L MOD SED 03/27/2016 Julieanne Cotton, MD [facility]  . IR GENERIC HISTORICAL  03/27/2016   IR ANGIOGRAM FOLLOW UP STUDY 03/27/2016 Julieanne Cotton, MD [facility]  . IR GENERIC HISTORICAL  03/27/2016   IR ANGIO VERTEBRAL SEL SUBCLAVIAN INNOMINATE UNI R MOD SED 03/27/2016 Julieanne Cotton, MD [facility]  . IR GENERIC HISTORICAL  03/27/2016  IR 3D INDEPENDENT WKST 03/27/2016 Julieanne Cotton, MD [facility]  . IR GENERIC HISTORICAL  03/27/2016   IR ANGIOGRAM FOLLOW UP STUDY 03/27/2016 Julieanne Cotton, MD [facility]  . IR GENERIC HISTORICAL  03/27/2016   IR NEURO EACH ADD'L AFTER BASIC UNI RIGHT (MS) 03/27/2016 Julieanne Cotton, MD [facility]  . IR GENERIC HISTORICAL  03/27/2016   IR ANGIOGRAM FOLLOW UP STUDY 03/27/2016 Julieanne Cotton, MD [facility]  . IR GENERIC HISTORICAL  03/27/2016   IR TRANSCATH/EMBOLIZ 03/27/2016 Julieanne Cotton, MD [facility]  . IR GENERIC HISTORICAL  03/27/2016   IR ANGIO INTRA EXTRACRAN SEL INTERNAL CAROTID UNI R MOD SED 03/27/2016 Julieanne Cotton, MD [facility]  . IR GENERIC HISTORICAL  03/27/2016   IR ANGIOGRAM FOLLOW UP STUDY 03/27/2016  Julieanne Cotton, MD [facility]  . IR GENERIC HISTORICAL  04/12/2016   IR RADIOLOGIST EVAL & MGMT 04/12/2016 [facility]  . RADIOLOGY WITH ANESTHESIA N/A 02/29/2016   Procedure: EMBOLIZATION     (RADIOLOGY WITH ANESTHESIA);  Surgeon: Julieanne Cotton, MD;  Location: [facility] OR;  Service: Radiology;  Laterality: N/A;  . RADIOLOGY WITH ANESTHESIA N/A 03/27/2016   Procedure: Embolization;  Surgeon: Julieanne Cotton, MD;  Location[facility] OR;  Service: Radiology;  Laterality: N/A;  . VIDEO BRONCHOSCOPY Bilateral 12/06/2013   Procedure: VIDEO BRONCHOSCOPY WITHOUT FLUORO;  Surgeon: Alyson Reedy, MD;  Location: [facility] ENDOSCOPY;  Service: Cardiopulmonary;  Laterality: Bilateral;    Family History  Problem Relation Age of Onset  . Cancer Mother   . Arthritis Mother   . Stroke Father   . Hypertension Father   . Colon cancer Neg Hx     Allergies  Allergen Reactions  . Symbicort [Budesonide-Formoterol Fumarate] Shortness Of Breath, Nausea And Vomiting and Other (See Comments)    heachache  . Dulera [Mometasone Furo-Formoterol Fum] Other (See Comments)    Headache     Current Outpatient Prescriptions on File Prior to Visit  Medication Sig Dispense Refill  . albuterol (PROVENTIL HFA;VENTOLIN HFA) 108 (90 Base) MCG/ACT inhaler Inhale 2 puffs into the lungs every 4 (four) hours as needed for wheezing or shortness of breath.    Marland Kitchen albuterol (PROVENTIL) (2.5 MG/3ML) 0.083% nebulizer solution Take 3 mLs (2.5 mg total) by nebulization every 6 (six) hours as needed for wheezing or shortness of breath. DX: J44.9 75 mL 12  . ALPRAZolam (XANAX) 0.5 MG tablet 1 tab po q day as needed severe anxiety/panic attack. 30 tablet 0  . amLODipine (NORVASC) 10 MG tablet TAKE 1 TABLET (10 MG) BY MOUTH DAILY. 90 tablet 3  . aspirin EC 81 MG tablet Take 81 mg by mouth daily.    Marland Kitchen b complex vitamins tablet Take 1 tablet by mouth daily.    . cloNIDine (CATAPRES) 0.2 MG tablet Take 1 tablet (0.2 mg total) by mouth 2 (two) times  daily. 60 tablet 11  . famotidine (PEPCID) 20 MG tablet TAKE 1 TABLET (20 MG TOTAL) BY MOUTH AT BEDTIME. 90 tablet 1  . fenofibrate (TRICOR) 48 MG tablet TAKE 1 TABLET (48 MG TOTAL) BY MOUTH DAILY. 30 tablet 3  . fluticasone (FLONASE) 50 MCG/ACT nasal spray Place 2 sprays into both nostrils daily. 16 g 2  . Fluticasone-Salmeterol (ADVAIR DISKUS) 500-50 MCG/DOSE AEPB Inhale 1 puff into the lungs 2 (two) times daily. 60 each 3  . pantoprazole (PROTONIX) 40 MG tablet Take 1 tablet (40 mg total) by mouth daily at 12 noon. 30 tablet 3  . roflumilast (DALIRESP) 500 MCG TABS tablet  Take 1 tablet (500 mcg total) by mouth daily. 30 tablet 3  . ticagrelor (BRILINTA) 90 MG TABS tablet Take 45-90 mg by mouth 2 (two) times daily. 1 in the morning and 1/2 in the evening    . tiotropium (SPIRIVA HANDIHALER) 18 MCG inhalation capsule PLACE 1 CAPSULE INTO INHALER AND INHALE INTO LUNGS DAILY. 90 capsule 3   Current Facility-Administered Medications on File Prior to Visit  Medication Dose Route Frequency Provider Last Rate Last Dose  . 0.9 %  sodium chloride infusion   Intravenous Continuous Ralene Muskrat, PA-C      . aspirin EC tablet 325 mg  325 mg Oral 60 min Pre-Op Ralene Muskrat, PA-C      . niMODipine (NIMOTOP) capsule 0-60 mg  0-60 mg Oral 60 min Pre-Op Ralene Muskrat, PA-C        BP (!) 148/96   Pulse 80   Temp 97.8 F (36.6 C) (Oral)   Resp 16   Ht 6' (1.829 m)   Wt 185 lb (83.9 kg)   SpO2 100%   BMI 25.09 kg/m       Objective:   Physical Exam   General Appearance- Not in acute distress.    Chest and Lung Exam Auscultation: Breath sounds:-Normal. Clear even and unlabored. Adventitious sounds:- No Adventitious sounds.  Cardiovascular Auscultation:Rythm - Regular, rate and rythm. Heart Sounds -Normal heart sounds.  Abdomen Inspection:-Inspection Normal.  Palpation/Perucssion: Palpation and Percussion of the abdomen reveal- Non Tender, No Rebound tenderness, No rigidity(Guarding)  and No Palpable abdominal masses.  Liver:-Normal.  Spleen:- Normal.   Back Mo miid lumbar spine tenderness to palpation. Pain on straight leg lift. Pain on lateral movements and flexion/extension of the spine.  Lower ext neurologic  L5-S1 sensation intact bilaterally. Normal patellar reflexes bilaterally. No foot drop bilaterally.  Rt hip- faint rt hip area pain on rom. Most of pain in iliac crest posterior aspect.      Assessment & Plan:  For your right hip pain and pelvic bone area pain, we will get x-rays today. Pain may be joint region or muscular. Possibly both are cause of pain.  Will prescribe diclofenac for pain and inflammation. Flexeril muscle relaxant to use at night. Norco pain medication that should number provided for breakthrough pain. 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  Depending on what x-ray show and  how you're doing might refer to orthopedist or sports medicine.  Follow-up date to be determined after x-ray review.  Najwa Spillane, Ramon Dredge, PA-C

## 2017-05-27 ENCOUNTER — Telehealth: Payer: Self-pay | Admitting: Medical

## 2017-05-27 DIAGNOSIS — R102 Pelvic and perineal pain: Secondary | ICD-10-CM

## 2017-05-27 DIAGNOSIS — M25551 Pain in right hip: Secondary | ICD-10-CM

## 2017-05-27 NOTE — Telephone Encounter (Signed)
Called pt and discussed his xray report. My thoughts on xray findings and Dr. Andree Coss opinion that likely had typo. Likely should read no significant arthropathy.

## 2017-05-27 NOTE — Telephone Encounter (Signed)
Pt xrays shows no fracture. Radiologist states significant arthropathy of hip. I reviewed pt xray. I am not sure I would classify as significant. I can refer him to sports medicine. And before he goes to sports medicine ask that Sports med Doc look at xray to see if he agrees. If he does then he might recommend orthopedist referral. Is he ok with referral.

## 2017-05-27 NOTE — Telephone Encounter (Signed)
See referral to sports medicine.

## 2017-05-28 ENCOUNTER — Encounter (HOSPITAL_COMMUNITY): Payer: Self-pay

## 2017-05-28 DIAGNOSIS — F17210 Nicotine dependence, cigarettes, uncomplicated: Secondary | ICD-10-CM | POA: Diagnosis not present

## 2017-05-28 DIAGNOSIS — J45909 Unspecified asthma, uncomplicated: Secondary | ICD-10-CM | POA: Insufficient documentation

## 2017-05-28 DIAGNOSIS — I10 Essential (primary) hypertension: Secondary | ICD-10-CM | POA: Diagnosis not present

## 2017-05-28 DIAGNOSIS — Z79899 Other long term (current) drug therapy: Secondary | ICD-10-CM | POA: Diagnosis not present

## 2017-05-28 DIAGNOSIS — J449 Chronic obstructive pulmonary disease, unspecified: Secondary | ICD-10-CM | POA: Insufficient documentation

## 2017-05-28 DIAGNOSIS — M25451 Effusion, right hip: Secondary | ICD-10-CM | POA: Diagnosis not present

## 2017-05-28 DIAGNOSIS — M25551 Pain in right hip: Secondary | ICD-10-CM | POA: Insufficient documentation

## 2017-05-28 DIAGNOSIS — Z7982 Long term (current) use of aspirin: Secondary | ICD-10-CM | POA: Diagnosis not present

## 2017-05-28 NOTE — Telephone Encounter (Signed)
Pt.notified

## 2017-05-28 NOTE — ED Triage Notes (Signed)
Pt complains of hip pain for one week, no injury He says it was aching then he was having trouble walking,  He received meds from his primary and felt better today but as he was cooking dinner the pain became worse and he can't move his right leg

## 2017-05-29 ENCOUNTER — Emergency Department (HOSPITAL_COMMUNITY)
Admission: EM | Admit: 2017-05-29 | Discharge: 2017-05-29 | Disposition: A | Discharge status: Home / Self Care | Payer: 59 | Attending: Emergency Medicine | Admitting: Emergency Medicine

## 2017-05-29 ENCOUNTER — Emergency Department (HOSPITAL_COMMUNITY): Payer: 59

## 2017-05-29 DIAGNOSIS — I10 Essential (primary) hypertension: Secondary | ICD-10-CM | POA: Diagnosis not present

## 2017-05-29 DIAGNOSIS — M25551 Pain in right hip: Secondary | ICD-10-CM

## 2017-05-29 DIAGNOSIS — M25451 Effusion, right hip: Secondary | ICD-10-CM | POA: Diagnosis not present

## 2017-05-29 DIAGNOSIS — J449 Chronic obstructive pulmonary disease, unspecified: Secondary | ICD-10-CM | POA: Diagnosis not present

## 2017-05-29 DIAGNOSIS — R52 Pain, unspecified: Secondary | ICD-10-CM

## 2017-05-29 DIAGNOSIS — J45909 Unspecified asthma, uncomplicated: Secondary | ICD-10-CM | POA: Diagnosis not present

## 2017-05-29 DIAGNOSIS — F17210 Nicotine dependence, cigarettes, uncomplicated: Secondary | ICD-10-CM | POA: Diagnosis not present

## 2017-05-29 DIAGNOSIS — Z79899 Other long term (current) drug therapy: Secondary | ICD-10-CM | POA: Diagnosis not present

## 2017-05-29 DIAGNOSIS — Z7982 Long term (current) use of aspirin: Secondary | ICD-10-CM | POA: Diagnosis not present

## 2017-05-29 LAB — CBC WITH DIFFERENTIAL/PLATELET
BASOS PCT: 0 %
Basophils Absolute: 0 10*3/uL (ref 0.0–0.1)
EOS ABS: 0 10*3/uL (ref 0.0–0.7)
EOS PCT: 0 %
HCT: 42.3 % (ref 39.0–52.0)
Hemoglobin: 14.2 g/dL (ref 13.0–17.0)
LYMPHS ABS: 1.1 10*3/uL (ref 0.7–4.0)
Lymphocytes Relative: 8 %
MCH: 29.3 pg (ref 26.0–34.0)
MCHC: 33.6 g/dL (ref 30.0–36.0)
MCV: 87.4 fL (ref 78.0–100.0)
MONO ABS: 0.4 10*3/uL (ref 0.1–1.0)
MONOS PCT: 3 %
NEUTROS PCT: 89 %
Neutro Abs: 12.5 10*3/uL — ABNORMAL HIGH (ref 1.7–7.7)
PLATELETS: 364 10*3/uL (ref 150–400)
RBC: 4.84 MIL/uL (ref 4.22–5.81)
RDW: 14 % (ref 11.5–15.5)
WBC: 14 10*3/uL — ABNORMAL HIGH (ref 4.0–10.5)

## 2017-05-29 LAB — I-STAT CHEM 8, ED
BUN: 16 mg/dL (ref 6–20)
CALCIUM ION: 1.24 mmol/L (ref 1.15–1.40)
Chloride: 103 mmol/L (ref 101–111)
Creatinine, Ser: 1.1 mg/dL (ref 0.61–1.24)
Glucose, Bld: 138 mg/dL — ABNORMAL HIGH (ref 65–99)
HEMATOCRIT: 45 % (ref 39.0–52.0)
HEMOGLOBIN: 15.3 g/dL (ref 13.0–17.0)
Potassium: 4.2 mmol/L (ref 3.5–5.1)
SODIUM: 137 mmol/L (ref 135–145)
TCO2: 25 mmol/L (ref 22–32)

## 2017-05-29 LAB — SYNOVIAL CELL COUNT + DIFF, W/ CRYSTALS
CRYSTALS FLUID: NONE SEEN
Lymphocytes-Synovial Fld: 2 % (ref 0–20)
Monocyte-Macrophage-Synovial Fluid: 12 % — ABNORMAL LOW (ref 50–90)
NEUTROPHIL, SYNOVIAL: 86 % — AB (ref 0–25)
WBC, SYNOVIAL: 10900 /mm3 — AB (ref 0–200)

## 2017-05-29 LAB — URINALYSIS, ROUTINE W REFLEX MICROSCOPIC
BILIRUBIN URINE: NEGATIVE
Glucose, UA: NEGATIVE mg/dL
Hgb urine dipstick: NEGATIVE
KETONES UR: NEGATIVE mg/dL
Leukocytes, UA: NEGATIVE
NITRITE: NEGATIVE
PH: 6 (ref 5.0–8.0)
Protein, ur: NEGATIVE mg/dL
SPECIFIC GRAVITY, URINE: 1.013 (ref 1.005–1.030)

## 2017-05-29 LAB — PROTIME-INR
INR: 0.91
Prothrombin Time: 12.1 seconds (ref 11.4–15.2)

## 2017-05-29 MED ORDER — OXYCODONE-ACETAMINOPHEN 5-325 MG PO TABS
1.0000 | ORAL_TABLET | ORAL | Status: AC
  Administered 2017-05-29: 1 via ORAL
  Filled 2017-05-29: qty 1

## 2017-05-29 MED ORDER — KETOROLAC TROMETHAMINE 60 MG/2ML IM SOLN
30.0000 mg | Freq: Once | INTRAMUSCULAR | Status: AC
  Administered 2017-05-29: 30 mg via INTRAMUSCULAR
  Filled 2017-05-29: qty 2

## 2017-05-29 MED ORDER — LIDOCAINE HCL 1 % IJ SOLN
INTRAMUSCULAR | Status: AC
Start: 2017-05-29 — End: 2017-05-29
  Administered 2017-05-29: 11:00:00 via INTRA_ARTICULAR
  Filled 2017-05-29: qty 20

## 2017-05-29 MED ORDER — FENTANYL CITRATE (PF) 100 MCG/2ML IJ SOLN
100.0000 ug | Freq: Once | INTRAMUSCULAR | Status: AC
  Administered 2017-05-29: 100 ug via INTRAVENOUS
  Filled 2017-05-29: qty 2

## 2017-05-29 MED ORDER — LIDOCAINE 5 % EX PTCH
1.0000 | MEDICATED_PATCH | CUTANEOUS | Status: DC
  Administered 2017-05-29: 1 via TRANSDERMAL
  Filled 2017-05-29: qty 1

## 2017-05-29 MED ORDER — KETOROLAC TROMETHAMINE 30 MG/ML IJ SOLN
30.0000 mg | Freq: Once | INTRAMUSCULAR | Status: AC
  Administered 2017-05-29: 30 mg via INTRAVENOUS
  Filled 2017-05-29: qty 1

## 2017-05-29 MED ORDER — METHOCARBAMOL 500 MG PO TABS: 500.0000 mg | ORAL_TABLET | Freq: Three times a day (TID) | ORAL | 0 refills | Status: DC | PRN

## 2017-05-29 MED ORDER — OXYCODONE-ACETAMINOPHEN 5-325 MG PO TABS: 1.0000 | ORAL_TABLET | ORAL | 0 refills | Status: DC | PRN

## 2017-05-29 MED ORDER — DEXAMETHASONE SODIUM PHOSPHATE 10 MG/ML IJ SOLN
10.0000 mg | Freq: Once | INTRAMUSCULAR | Status: AC
  Administered 2017-05-29: 10 mg via INTRAMUSCULAR
  Filled 2017-05-29: qty 1

## 2017-05-29 MED ORDER — KETOROLAC TROMETHAMINE 10 MG PO TABS: 10.0000 mg | ORAL_TABLET | Freq: Three times a day (TID) | ORAL | 0 refills | Status: DC | PRN

## 2017-05-29 MED ORDER — METHOCARBAMOL 500 MG PO TABS
1000.0000 mg | ORAL_TABLET | Freq: Once | ORAL | Status: AC
  Administered 2017-05-29: 1000 mg via ORAL
  Filled 2017-05-29: qty 2

## 2017-05-29 MED FILL — OXYCODONE-ACETAMINOPHEN 5-3: 5-325 | 3 days supply | Qty: 20 | Fill #0

## 2017-05-29 MED FILL — KETOROLAC 10 MG TABLET: 10 | 5 days supply | Qty: 15 | Fill #0

## 2017-05-29 MED FILL — METHOCARBAMOL 500 MG TABLET: 500 | 5 days supply | Qty: 15 | Fill #0

## 2017-05-29 NOTE — ED Notes (Signed)
Ortho tech called for crutches 

## 2017-05-29 NOTE — ED Notes (Signed)
Pt transported to procedure.

## 2017-05-29 NOTE — ED Provider Notes (Signed)
1:58 PM Patient is comfortable at this time.  Vital signs are stable.  IR performed right hip aspiration.  Gram stain demonstrates white blood cells without bacteria.  No crystals noted.  White blood cell count 10,900.  Patient seen in consultation by Dr. Dorene Grebe of orthopedic surgery.  These laboratory studies were reviewed with Dr. August Saucer who does not believe the patient needs to be taken to the operating room at this time for washout of his right hip.  He will see the patient closely and follow-up in the office.  Cultures have been sent.  Patient and spouse understand to return the emergency department for new or worsening symptoms including but not limited to worsening pain or development of fever or confusion.   Azalia Bilis, MD 05/29/17 1359

## 2017-05-29 NOTE — Progress Notes (Signed)
White count in hip aspiration fluid 10,000. Culture pending No crystals -no organism  Plan at this time is to initiate Toradol as an anti-inflammatory discharge with pain medicine and muscle relaxers and follow-up with me on Monday.  We will see how the cultures do over the weekend.  Cannot really justify opening his hip up at this time with clinically what were seeing in the hip aspirate.  That can change if anything grows out of culture.  I did discuss this with Dr. Patria Mane.

## 2017-05-29 NOTE — ED Provider Notes (Signed)
[facility]-EMERGENCY DEPT Provider Note   CSN: [id number] Arrival date & time: 05/28/17  Dec 16, 2138     History   Chief Complaint Chief Complaint  Patient presents with  . Hip Pain    HPI Fonnie Welburn is a 56 y.o. male.  The history is provided by the patient.  Hip Pain  This is a new problem. The current episode started more than 1 week ago. The problem occurs constantly. The problem has not changed since onset.Pertinent negatives include no chest pain, no abdominal pain, no headaches and no shortness of breath. The symptoms are aggravated by twisting, walking and standing. Nothing relieves the symptoms. Treatments tried: flexeril, voltaren and vicodin  The treatment provided no relief.  Works doing trade shows, has had pain for more than a week.  Seen at [facility] and had negative Xrays and was started on pain medication and a muscle relaxant but this has not helped.  No trauma.  No f/c/r.    Past Medical History:  Diagnosis Date  . Asthma   . CAP (community acquired pneumonia) 01/19/2016  . Chronic bronchitis (HCC)   . COPD (chronic obstructive pulmonary disease) (HCC)   . Emphysema lung (HCC)   . Emphysema of lung (HCC)   . Hepatitis A infection ~ 1980  . High cholesterol   . Hypertension   . Pneumonia 1960s  . Rheumatoid arthritis (HCC)    "hands mostly"  ( 01/19/2016)  . Situational depression 2009/12/15   "only when my mother died"    Patient Active Problem List   Diagnosis Date Noted  . Nocturnal hypoxemia 04/03/2017  . Fatigue 01/09/2017  . Oral thrush 12/12/2016  . Brain aneurysm 03/27/2016  . Preop respiratory exam 03/26/2016  . Aneurysm (HCC)   . Chronic obstructive pulmonary disease with acute exacerbation (HCC) 01/19/2016  . Community acquired pneumonia 01/19/2016  . Cough syncope 01/19/2016  . Wellness examination 08/29/2015  . Hyperglycemia 01/06/2015  . HTN (hypertension) 12/09/2014  . COPD exacerbation (HCC) 07/08/2014  . Atypical  chest pain 07/08/2014  . 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 Dry mouth 12/26/2013  . Cigarette smoker 12/26/2013  . Nodule of left lung 12/26/2013  . COPD, severity to be determined (HCC) 12/26/2013  . Pulmonary nodule, right 12/06/2013  . Hemoptysis 12/03/2013  . COPD (chronic obstructive pulmonary disease) (HCC) 12/03/2013  . Acute bronchitis 12/03/2013  . Tobacco use disorder 12/03/2013    Past Surgical History:  Procedure Laterality Date  . APPENDECTOMY  1980's  . CHEST TUBE INSERTION Left 1990's   "lung collapsed"  . CYSTECTOMY Left 1960's   "wrist"  . FOOT FRACTURE SURGERY Left ~ 2003-12-16   "it was crushed"  . IR GENERIC HISTORICAL  03/27/2016   IR ANGIOGRAM FOLLOW UP STUDY 03/27/2016 Julieanne Cotton, MD [facility]  . IR GENERIC HISTORICAL  03/27/2016   IR ANGIOGRAM FOLLOW UP STUDY 03/27/2016 Julieanne Cotton, MD [facility]  . IR GENERIC HISTORICAL  03/27/2016   IR ANGIO INTRA EXTRACRAN SEL COM CAROTID INNOMINATE UNI L MOD SED 03/27/2016 Julieanne Cotton, MD [facility]  . IR GENERIC HISTORICAL  03/27/2016   IR ANGIOGRAM FOLLOW UP STUDY 03/27/2016 Julieanne Cotton, MD [facility]  . IR GENERIC HISTORICAL  03/27/2016   IR ANGIO VERTEBRAL SEL SUBCLAVIAN INNOMINATE UNI R MOD SED 03/27/2016 Julieanne Cotton, MD [facility]  . IR GENERIC HISTORICAL  03/27/2016   IR 3D INDEPENDENT WKST 03/27/2016 Julieanne Cotton, MD [facility]  . IR GENERIC HISTORICAL  03/27/2016  IR ANGIOGRAM FOLLOW UP STUDY 03/27/2016 Julieanne CottonSanjeev Deveshwar, MD [facility]  . IR GENERIC HISTORICAL  03/27/2016   IR NEURO EACH ADD'L AFTER BASIC UNI RIGHT (MS) 03/27/2016 Julieanne CottonSanjeev Deveshwar, MD [facility]  . IR GENERIC HISTORICAL  03/27/2016   IR ANGIOGRAM FOLLOW UP STUDY 03/27/2016 Julieanne CottonSanjeev Deveshwar, MD [facility]  . IR GENERIC HISTORICAL  03/27/2016   IR TRANSCATH/EMBOLIZ 03/27/2016 Julieanne CottonSanjeev Deveshwar, MD [facility]  . IR GENERIC HISTORICAL  03/27/2016   IR ANGIO INTRA EXTRACRAN SEL INTERNAL CAROTID UNI R  MOD SED 03/27/2016 Julieanne CottonSanjeev Deveshwar, MD [facility]  . IR GENERIC HISTORICAL  03/27/2016   IR ANGIOGRAM FOLLOW UP STUDY 03/27/2016 Julieanne CottonSanjeev Deveshwar, MD [facility]  . IR GENERIC HISTORICAL  04/12/2016   IR RADIOLOGIST EVAL & MGMT 04/12/2016 [facility]  . RADIOLOGY WITH ANESTHESIA N/A 02/29/2016   Procedure: EMBOLIZATION     (RADIOLOGY WITH ANESTHESIA);  Surgeon: Julieanne CottonSanjeev Deveshwar, MD;  [facility];  Service: Radiology;  Laterality: N/A;  . RADIOLOGY WITH ANESTHESIA N/A 03/27/2016   Procedure: Embolization;  Surgeon: Julieanne CottonSanjeev Deveshwar, MD;  Location: [facility] OR;  Service: Radiology;  Laterality: N/A;  . VIDEO BRONCHOSCOPY Bilateral 12/06/2013   Procedure: VIDEO BRONCHOSCOPY WITHOUT FLUORO;  Surgeon: Alyson ReedyWesam G Yacoub, MD;  [facility];  Service: Cardiopulmonary;  Laterality: Bilateral;       Home Medications    Prior to Admission medications   Medication Sig Start Date End Date Taking? Authorizing Provider  albuterol (PROVENTIL HFA;VENTOLIN HFA) 108 (90 Base) MCG/ACT inhaler Inhale 2 puffs into the lungs every 4 (four) hours as needed for wheezing or shortness of breath. 11/15/16  Yes Nyoka CowdenWert, Michael B, MD  albuterol (PROVENTIL) (2.5 MG/3ML) 0.083% nebulizer solution Take 3 mLs (2.5 mg total) by nebulization every 6 (six) hours as needed for wheezing or shortness of breath. DX: J44.9 04/03/17  Yes Bevelyn NgoGroce, Sarah F, NP  ALPRAZolam Prudy Feeler(XANAX) 0.5 MG tablet 1 tab po q day as needed severe anxiety/panic attack. Patient taking differently: Take 0.5 mg by mouth daily as needed for anxiety. 1 tab po q day as needed severe anxiety/panic attack. 10/08/16  Yes Saguier, Ramon DredgeEdward, PA-C  amLODipine (NORVASC) 10 MG tablet TAKE 1 TABLET (10 MG) BY MOUTH DAILY. 03/12/17  Yes Saguier, Kateri Mcdward, PA-C  aspirin EC 81 MG tablet Take 81 mg by mouth daily.   Yes [provider]  b complex vitamins tablet Take 1 tablet by mouth daily.   Yes [provider]  cloNIDine (CATAPRES) 0.2 MG tablet Take 1 tablet  (0.2 mg total) by mouth 2 (two) times daily. 05/19/16  Yes Minor, Vilinda BlanksWilliam S, NP  cyclobenzaprine (FLEXERIL) 10 MG tablet Take 1 tablet (10 mg total) by mouth at bedtime. 05/26/17  Yes Saguier, Ramon DredgeEdward, PA-C  diclofenac (VOLTAREN) 50 MG EC tablet Take 1 tablet (50 mg total) by mouth 2 (two) times daily. 05/26/17  Yes Saguier, Ramon DredgeEdward, PA-C  famotidine (PEPCID) 20 MG tablet TAKE 1 TABLET (20 MG TOTAL) BY MOUTH AT BEDTIME. 03/18/17  Yes Saguier, Ramon DredgeEdward, PA-C  fenofibrate (TRICOR) 48 MG tablet TAKE 1 TABLET (48 MG TOTAL) BY MOUTH DAILY. 04/15/17  Yes Saguier, Ramon DredgeEdward, PA-C  fluticasone (FLONASE) 50 MCG/ACT nasal spray Place 2 sprays into both nostrils daily. 01/09/17  Yes Bevelyn NgoGroce, Sarah F, NP  Fluticasone-Salmeterol (ADVAIR DISKUS) 500-50 MCG/DOSE AEPB Inhale 1 puff into the lungs 2 (two) times daily. 01/09/17  Yes Bevelyn NgoGroce, Sarah F, NP  HYDROcodone-acetaminophen (NORCO) 5-325 MG tablet Take 1 tablet by mouth every 6 (six) hours as needed  for moderate pain or severe pain. 05/26/17  Yes Saguier, Ramon Dredge, PA-C  ibuprofen (ADVIL,MOTRIN) 200 MG tablet Take 400 mg by mouth every 6 (six) hours as needed for moderate pain.   Yes [provider]  pantoprazole (PROTONIX) 40 MG tablet Take 1 tablet (40 mg total) by mouth daily at 12 noon. 09/05/16  Yes Saguier, Ramon Dredge, PA-C  roflumilast (DALIRESP) 500 MCG TABS tablet Take 1 tablet (500 mcg total) by mouth daily. 01/09/17  Yes Bevelyn Ngo, NP  ticagrelor (BRILINTA) 90 MG TABS tablet Take 45-90 mg by mouth 2 (two) times daily. 1 in the morning and 1/2 in the evening   Yes [provider]  tiotropium (SPIRIVA HANDIHALER) 18 MCG inhalation capsule PLACE 1 CAPSULE INTO INHALER AND INHALE INTO LUNGS DAILY. 01/09/17  Yes Bevelyn Ngo, NP    Family History Family History  Problem Relation Age of Onset  . Cancer Mother   . Arthritis Mother   . Stroke Father   . Hypertension Father   . Colon cancer Neg Hx     Social History Social History  Substance Use Topics    . Smoking status: Current Every Day Smoker    Packs/day: 1.00    Years: 40.00    Types: Cigarettes  . Smokeless tobacco: Never Used     Comment: smoking 1/2ppd as of 05/23/17  ep  . Alcohol use 6.0 oz/week    10 Cans of beer per week     Comment: 12/04/2013 "2beers/day"     Allergies   Symbicort [budesonide-formoterol fumarate] and Dulera [mometasone furo-formoterol fum]   Review of Systems Review of Systems  Constitutional: Negative for diaphoresis and fever.  Respiratory: Negative for shortness of breath.   Cardiovascular: Negative for chest pain.  Gastrointestinal: Negative for abdominal pain, diarrhea and vomiting.  Genitourinary: Negative for dysuria, flank pain, frequency and testicular pain.  Musculoskeletal: Positive for arthralgias. Negative for back pain.  Neurological: Negative for headaches.  All other systems reviewed and are negative.    Physical Exam Updated Vital Signs BP (!) 176/93 (BP Location: Left Arm)   Pulse 100   Temp 98.6 F (37 C) (Oral)   Resp 16   SpO2 99%   Physical Exam  Constitutional: He is oriented to person, place, and time. He appears well-developed and well-nourished. No distress.  HENT:  Head: Normocephalic and atraumatic.  Mouth/Throat: Oropharynx is clear and moist. No oropharyngeal exudate.  Eyes: Pupils are equal, round, and reactive to light. Conjunctivae are normal.  Neck: Normal range of motion. Neck supple.  Cardiovascular: Normal rate, regular rhythm, normal heart sounds and intact distal pulses.   Pulmonary/Chest: Effort normal and breath sounds normal. No respiratory distress. He has no wheezes. He has no rales.  Abdominal: Soft. Bowel sounds are normal. He exhibits no mass. There is no tenderness. There is no rebound and no guarding. No hernia.  Musculoskeletal: He exhibits no deformity.       Right hip: He exhibits decreased range of motion. He exhibits no swelling, no crepitus, no deformity and no laceration.        Right knee: Normal.       Right ankle: Normal. Achilles tendon normal.       Right upper leg: Normal.       Right foot: Normal.  Neurological: He is alert and oriented to person, place, and time. He displays normal reflexes. No sensory deficit. He exhibits normal muscle tone.  Skin: Skin is warm and dry. Capillary refill takes  less than 2 seconds.  Psychiatric: He has a normal mood and affect.  Nursing note and vitals reviewed.    ED Treatments / Results   Vitals:   05/29/17 0300 05/29/17 0330  BP: (!) 165/99 (!) 163/98  Pulse:  (!) 101  Resp:    Temp:    SpO2:  96%    Labs (all labs ordered are listed, but only abnormal results are displayed)  Results for orders placed or performed during the hospital encounter of 05/29/17  Urinalysis, Routine w reflex microscopic  Result Value Ref Range   Color, Urine YELLOW YELLOW   APPearance CLEAR CLEAR   Specific Gravity, Urine 1.013 1.005 - 1.030   pH 6.0 5.0 - 8.0   Glucose, UA NEGATIVE NEGATIVE mg/dL   Hgb urine dipstick NEGATIVE NEGATIVE   Bilirubin Urine NEGATIVE NEGATIVE   Ketones, ur NEGATIVE NEGATIVE mg/dL   Protein, ur NEGATIVE NEGATIVE mg/dL   Nitrite NEGATIVE NEGATIVE   Leukocytes, UA NEGATIVE NEGATIVE   Mr Shirlee Latch Wo Contrast  Result Date: 05/09/2017 CLINICAL DATA:  Followup coiled aneurysm. EXAM: MRI HEAD WITHOUT AND WITH CONTRAST MRA HEAD WITHOUT CONTRAST TECHNIQUE: Multiplanar, multiecho pulse sequences of the brain and surrounding structures were obtained without and with intravenous contrast. Angiographic images of the head were obtained using MRA technique without contrast. CONTRAST:  23mL MULTIHANCE GADOBENATE DIMEGLUMINE 529 MG/ML IV SOLN COMPARISON:  10/31/2016 FINDINGS: MRI HEAD FINDINGS Brain: No acute or interval infarct. There have been small remote cortical infarcts along the right cerebral convexity, underestimated relative to 10/31/2016 brain MRI. No hemorrhage, hydrocephalus, or masslike finding. Normal  brain volume.No detectable perianeurysmal inflammation/enhancement. Vascular: Arterial findings below. Normal dural venous sinus flow voids. Skull and upper cervical spine: Negative for marrow lesion. Sinuses/Orbits: Negative MRA HEAD FINDINGS Symmetric carotid and vertebral arteries. No unusual branching. Incomplete circle-of-Willis at the posterior communicating arteries. 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 Unchanged channel like flow related signal extending into the face in the auricle mass. This channel like area measures 1.6 mm on 3D reformats which were sent to PACS but have not arrived at time of signing. No second aneurysm is seen. Apparent proximal right M2 branch narrowing on 3D reformats may be related to artifact from the coil mass. No definite stenosis. IMPRESSION: 1. 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 2. No acute or interval infarct. No detectable perianeurysmal inflammation/enhancement. Electronically Signed   By: Marnee Spring M.D.   On: 05/09/2017 09:38   Mr Laqueta Jean QI Contrast  Result Date: 05/09/2017 CLINICAL DATA:  Followup coiled aneurysm. EXAM: MRI HEAD WITHOUT AND WITH CONTRAST MRA HEAD WITHOUT CONTRAST TECHNIQUE: Multiplanar, multiecho pulse sequences of the brain and surrounding structures were obtained without and with intravenous contrast. Angiographic images of the head were obtained using MRA technique without contrast. CONTRAST:  3mL MULTIHANCE GADOBENATE DIMEGLUMINE 529 MG/ML IV SOLN COMPARISON:  10/31/2016 FINDINGS: MRI HEAD FINDINGS Brain: No acute or interval infarct. There have been small remote cortical infarcts along the right cerebral convexity, underestimated relative to 10/31/2016 brain MRI. No hemorrhage, hydrocephalus, or masslike finding. Normal brain volume.No detectable perianeurysmal inflammation/enhancement. Vascular: Arterial findings below. Normal dural venous  sinus flow voids. Skull and upper cervical spine: Negative for marrow lesion. Sinuses/Orbits: Negative MRA HEAD FINDINGS Symmetric carotid and vertebral arteries. No unusual branching. Incomplete circle-of-Willis at the posterior communicating arteries. 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 Unchanged channel like flow related signal extending into  the face in the auricle mass. This channel like area measures 1.6 mm on 3D reformats which were sent to PACS but have not arrived at time of signing. No second aneurysm is seen. Apparent proximal right M2 branch narrowing on 3D reformats may be related to artifact from the coil mass. No definite stenosis. IMPRESSION: 1. 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 2. No acute or interval infarct. No detectable perianeurysmal inflammation/enhancement. Electronically Signed   By: Marnee Spring M.D.   On: 05/09/2017 09:38   Dg Hip Unilat With Pelvis 2-3 Views Right  Result Date: 05/26/2017 CLINICAL DATA:  Pt having right hip pain for a week with no injury,low back on the right as well EXAM: DG HIP (WITH OR WITHOUT PELVIS) 2-3V RIGHT COMPARISON:  None. FINDINGS: Hips are located. No evidence of pelvic fracture or sacral fracture. Dedicated view of the RIGHT hip demonstrates no femoral neck fracture. IMPRESSION: No pelvic fracture or hip fracture.  Significant arthropathy. Electronically Signed   By: Genevive Bi M.D.   On: 05/26/2017 11:37    Radiology No results found.  Procedures Procedures (including critical care time)  Medications Ordered in ED  Medications  lidocaine (LIDODERM) 5 % 1 patch (1 patch Transdermal Patch Applied 05/29/17 0155)  ketorolac (TORADOL) injection 30 mg (30 mg Intramuscular Given 05/29/17 0154)  methocarbamol (ROBAXIN) tablet 1,000 mg (1,000 mg Oral Given 05/29/17 0330)  oxyCODONE-acetaminophen (PERCOCET/ROXICET) 5-325 MG per tablet 1  tablet (1 tablet Oral Given 05/29/17 0330)  dexamethasone (DECADRON) injection 10 mg (10 mg Intramuscular Given 05/29/17 0329)    Case d/w Dr. August Saucer, please have IR do needle aspiration.  He will see the patient this am  Case d/w Dr. Lowella Dandy who will see the patient.  All labs and testing ordered in EPIC Final Clinical Impressions(s) / ED Diagnoses   Greater than 15 minutes spent in consultation with patient and wife regarding CT and ongoing care.  All questions answered to family satisfaction.   Labs sent, all testing ordered in EPIC.  Signed out to Dr. Patria Mane pending disposition of the patient post orthopedics consultation     Phinehas Grounds, MD 05/29/17 [phone]

## 2017-05-29 NOTE — Consult Note (Signed)
Reason for Consult:right hip pain Referring Physician: dr Micheline Rough is an 56 y.o. male.  HPI: Derek Conner is a 56 year old patient with one-week history of right hip pain.  The pain started 1 week ago without history of trauma.  He reports one episode of fevers.  He has had a waxing and waning course.  This is not been constant severe pain but he has had days where it was fine and then the next day was very symptomatic.  He went to the emergency department ultrasound demonstrated effusion in the right hip  He does have a white count of 14,000.  No family history of gout or pseudoout.  he does have a history of steroid use for COPD  Past Medical History:  Diagnosis Date  . Asthma   . CAP (community acquired pneumonia) 01/19/2016  . Chronic bronchitis (HCC)   . COPD (chronic obstructive pulmonary disease) (HCC)   . Emphysema lung (HCC)   . Emphysema of lung (HCC)   . Hepatitis A infection ~ 1980  . High cholesterol   . Hypertension   . Pneumonia 1960s  . Rheumatoid arthritis (HCC)    "hands mostly"  ( 01/19/2016)  . Situational depression November 26, 2009   "only when my mother died"    Past Surgical History:  Procedure Laterality Date  . APPENDECTOMY  1980's  . CHEST TUBE INSERTION Left 1990's   "lung collapsed"  . CYSTECTOMY Left 1960's   "wrist"  . FOOT FRACTURE SURGERY Left ~ 11-27-03   "it was crushed"  . IR GENERIC HISTORICAL  03/27/2016   IR ANGIOGRAM FOLLOW UP STUDY 03/27/2016 Julieanne Cotton, MD [facility]  . IR GENERIC HISTORICAL  03/27/2016   IR ANGIOGRAM FOLLOW UP STUDY 03/27/2016 Julieanne Cotton, MD [facility]  . IR GENERIC HISTORICAL  03/27/2016   IR ANGIO INTRA EXTRACRAN SEL COM CAROTID INNOMINATE UNI L MOD SED 03/27/2016 Julieanne Cotton, MD [facility]  . IR GENERIC HISTORICAL  03/27/2016   IR ANGIOGRAM FOLLOW UP STUDY 03/27/2016 Julieanne Cotton, MD [facility]  . IR GENERIC HISTORICAL  03/27/2016   IR ANGIO VERTEBRAL SEL SUBCLAVIAN INNOMINATE UNI R MOD SED  03/27/2016 Julieanne Cotton, MD [facility]  . IR GENERIC HISTORICAL  03/27/2016   IR 3D INDEPENDENT WKST 03/27/2016 Julieanne Cotton, MD [facility]  . IR GENERIC HISTORICAL  03/27/2016   IR ANGIOGRAM FOLLOW UP STUDY 03/27/2016 Julieanne Cotton, MD [facility]  . IR GENERIC HISTORICAL  03/27/2016   IR NEURO EACH ADD'L AFTER BASIC UNI RIGHT (MS) 03/27/2016 Julieanne Cotton, MD [facility]  . IR GENERIC HISTORICAL  03/27/2016   IR ANGIOGRAM FOLLOW UP STUDY 03/27/2016 Julieanne Cotton, MD [facility]  . IR GENERIC HISTORICAL  03/27/2016   IR TRANSCATH/EMBOLIZ 03/27/2016 Julieanne Cotton, MD [facility]  . IR GENERIC HISTORICAL  03/27/2016   IR ANGIO INTRA EXTRACRAN SEL INTERNAL CAROTID UNI R MOD SED 03/27/2016 Julieanne Cotton, MD [facility]  . IR GENERIC HISTORICAL  03/27/2016   IR ANGIOGRAM FOLLOW UP STUDY 03/27/2016 Julieanne Cotton, MD [facility]  . IR GENERIC HISTORICAL  04/12/2016   IR RADIOLOGIST EVAL & MGMT 04/12/2016 [facility]  . RADIOLOGY WITH ANESTHESIA N/A 02/29/2016   Procedure: EMBOLIZATION     (RADIOLOGY WITH ANESTHESIA);  Surgeon: Julieanne Cotton, MD;  Location: [facility] OR;  Service: Radiology;  Laterality: N/A;  . RADIOLOGY WITH ANESTHESIA N/A 03/27/2016   Procedure: Embolization;  Surgeon: Julieanne Cotton, MD;  Location: [facility] OR;  Service: Radiology;  Laterality: N/A;  .  VIDEO BRONCHOSCOPY Bilateral 12/06/2013   Procedure: VIDEO BRONCHOSCOPY WITHOUT FLUORO;  Surgeon: Alyson Reedy, MD;  Location: [facility] ENDOSCOPY;  Service: Cardiopulmonary;  Laterality: Bilateral;    Family History  Problem Relation Age of Onset  . Cancer Mother   . Arthritis Mother   . Stroke Father   . Hypertension Father   . Colon cancer Neg Hx     Social History:  reports that he has been smoking Cigarettes.  He has a 40.00 pack-year smoking history. He has never used smokeless tobacco. He reports that he drinks about 6.0 oz of alcohol per week . He reports that he does not use  drugs.  Allergies:  Allergies  Allergen Reactions  . Symbicort [Budesonide-Formoterol Fumarate] Shortness Of Breath, Nausea And Vomiting and Other (See Comments)    heachache  . Dulera [Mometasone Furo-Formoterol Fum] Other (See Comments)    Headache     Medications: I have reviewed the patient's current medications.  Results for orders placed or performed during the hospital encounter of 05/29/17 (from the past 48 hour(s))  Urinalysis, Routine w reflex microscopic     Status: None   Collection Time: 05/29/17  2:00 AM  Result Value Ref Range   Color, Urine YELLOW YELLOW   APPearance CLEAR CLEAR   Specific Gravity, Urine 1.013 1.005 - 1.030   pH 6.0 5.0 - 8.0   Glucose, UA NEGATIVE NEGATIVE mg/dL   Hgb urine dipstick NEGATIVE NEGATIVE   Bilirubin Urine NEGATIVE NEGATIVE   Ketones, ur NEGATIVE NEGATIVE mg/dL   Protein, ur NEGATIVE NEGATIVE mg/dL   Nitrite NEGATIVE NEGATIVE   Leukocytes, UA NEGATIVE NEGATIVE  CBC with Differential/Platelet     Status: Abnormal   Collection Time: 05/29/17  6:20 AM  Result Value Ref Range   WBC 14.0 (H) 4.0 - 10.5 K/uL   RBC 4.84 4.22 - 5.81 MIL/uL   Hemoglobin 14.2 13.0 - 17.0 g/dL   HCT 71.0 62.6 - 94.8 %   MCV 87.4 78.0 - 100.0 fL   MCH 29.3 26.0 - 34.0 pg   MCHC 33.6 30.0 - 36.0 g/dL   RDW 54.6 27.0 - 35.0 %   Platelets 364 150 - 400 K/uL   Neutrophils Relative % 89 %   Neutro Abs 12.5 (H) 1.7 - 7.7 K/uL   Lymphocytes Relative 8 %   Lymphs Abs 1.1 0.7 - 4.0 K/uL   Monocytes Relative 3 %   Monocytes Absolute 0.4 0.1 - 1.0 K/uL   Eosinophils Relative 0 %   Eosinophils Absolute 0.0 0.0 - 0.7 K/uL   Basophils Relative 0 %   Basophils Absolute 0.0 0.0 - 0.1 K/uL  Protime-INR     Status: None   Collection Time: 05/29/17  6:20 AM  Result Value Ref Range   Prothrombin Time 12.1 11.4 - 15.2 seconds   INR 0.91   I-Stat Chem 8, ED     Status: Abnormal   Collection Time: 05/29/17  7:04 AM  Result Value Ref Range   Sodium 137 135 - 145  mmol/L   Potassium 4.2 3.5 - 5.1 mmol/L   Chloride 103 101 - 111 mmol/L   BUN 16 6 - 20 mg/dL   Creatinine, Ser 0.93 0.61 - 1.24 mg/dL   Glucose, Bld 818 (H) 65 - 99 mg/dL   Calcium, Ion 2.99 3.71 - 1.40 mmol/L   TCO2 25 22 - 32 mmol/L   Hemoglobin 15.3 13.0 - 17.0 g/dL   HCT 69.6 78.9 - 38.1 %  Ct Hip Right Wo Contrast  Result Date: 05/29/2017 CLINICAL DATA:  Acute onset of right hip pain and difficulty moving right hip. Initial encounter. EXAM: CT OF THE RIGHT HIP WITHOUT CONTRAST TECHNIQUE: Multidetector CT imaging of the right hip was performed according to the standard protocol. Multiplanar CT image reconstructions were also generated. COMPARISON:  Right hip radiographs performed 05/26/2017 FINDINGS: Bones/Joint/Cartilage There is no definite evidence of fracture or dislocation. Slight superior joint space narrowing is noted, with small subcortical cysts noted at the superior aspect of the right acetabulum. The pubic symphysis is unremarkable in appearance. Note is made of a small to moderate right hip joint effusion, with apparent thickening of the joint capsule. Would correlate clinically for any evidence of infection; depending on the patient's symptoms, joint aspiration could be considered. The cartilage is not well assessed on CT. Ligaments Suboptimally assessed by CT. Muscles and Tendons The visualized musculature is grossly unremarkable in appearance. No definite tendon abnormalities are characterized. Soft tissues Visualized small and large bowel loops are grossly unremarkable. The bladder is mildly distended and unremarkable in appearance. No inguinal lymphadenopathy is seen. No definite soft tissue hematoma is identified. IMPRESSION: 1. Small to moderate right hip joint effusion, with apparent thickening of the hip joint capsule. Would correlate clinically for any evidence of infection / septic joint; depending on the patient's symptoms, joint aspiration could be considered. 2. No  evidence of fracture or dislocation. 3. Slight superior joint space narrowing at the right hip, with associated small subcortical cysts at the superior right acetabulum. Electronically Signed   By: Roanna Raider M.D.   On: 05/29/2017 04:35   Dg Fluoro Guided Needle Plc Aspiration/injection Loc  Result Date: 05/29/2017 CLINICAL DATA:  Right hip pain. History of rheumatoid arthritis. Rule out septic joint. EXAM: Right HIP ASPIRATION UNDER FLUOROSCOPY FLUOROSCOPY TIME:  Fluoroscopy Time:  0 minutes 15 seconds Radiation Exposure Index (if provided by the fluoroscopic device): Number of Acquired Spot Images: 0 PROCEDURE: Overlying skin prepped with Betadine, draped in the usual sterile fashion, and infiltrated locally with buffered Lidocaine. Curved 22 gauge spinal needle advanced to the superolateral margin of the right femoral head. 5 mL of clear yellow joint fluid was obtained for laboratory studies. The patient tolerated the procedure well. IMPRESSION: Technically successful right hip injection under fluoroscopy. Electronically Signed   By: Marlan Palau M.D.   On: 05/29/2017 11:06    Review of Systems  Constitutional: Positive for fever.  Musculoskeletal: Positive for joint pain.   Blood pressure (!) 163/96, pulse 88, temperature 98.6 F (37 C), temperature source Oral, resp. rate 18, SpO2 96 %. Physical Exam  Constitutional: He appears well-developed.  HENT:  Head: Normocephalic.  Eyes: Pupils are equal, round, and reactive to light.  Neck: Normal range of motion.  Cardiovascular: Normal rate.   Respiratory: Effort normal.  Neurological: He is alert.  Skin: Skin is warm.  Psychiatric: He has a normal mood and affect.  right hip examination demonstrates pain with range of motion but not severe.  Patient has good ankle dorsiflexion plantar flexion strength and good range of motion of the ankles knees wrist elbows and shoulders without pain effusion in the other joints..  Does have some pain  with circumduction of the right hip but not the left hip.  Assessment/Plan: Impression is right hip effusion with your double hip with waxing and waning course over the past 4 days.  Not much in the way of history of gout or pseudogout.  Plan at  this time is aspiration of the hip.  Send that fluid for Gram stain crystals cell count aerobic and anaerobic culture.  He may need open debridement if positive for infection.  Nothing by mouth for now  [facility] 05/29/2017, 11:23 AM

## 2017-05-30 LAB — PROTEIN, BODY FLUID (OTHER): TOTAL PROTEIN, BODY FLUID OTHER: 4.9 g/dL

## 2017-06-01 LAB — BODY FLUID CULTURE: Culture: NO GROWTH

## 2017-06-02 ENCOUNTER — Ambulatory Visit (INDEPENDENT_AMBULATORY_CARE_PROVIDER_SITE_OTHER): Payer: 59 | Admitting: Orthopedic Surgery

## 2017-06-02 ENCOUNTER — Encounter (INDEPENDENT_AMBULATORY_CARE_PROVIDER_SITE_OTHER): Payer: Self-pay | Admitting: Orthopedic Surgery

## 2017-06-02 DIAGNOSIS — M25551 Pain in right hip: Secondary | ICD-10-CM | POA: Diagnosis not present

## 2017-06-02 MED FILL — ADVAIR 500/50 DISKUS: 500-50 | 30 days supply | Qty: 60 | Fill #1

## 2017-06-03 ENCOUNTER — Ambulatory Visit (INDEPENDENT_AMBULATORY_CARE_PROVIDER_SITE_OTHER): Payer: 59

## 2017-06-03 ENCOUNTER — Ambulatory Visit (INDEPENDENT_AMBULATORY_CARE_PROVIDER_SITE_OTHER): Payer: 59 | Admitting: Physical Medicine and Rehabilitation

## 2017-06-03 ENCOUNTER — Encounter (INDEPENDENT_AMBULATORY_CARE_PROVIDER_SITE_OTHER): Payer: Self-pay | Admitting: Physical Medicine and Rehabilitation

## 2017-06-03 DIAGNOSIS — M25551 Pain in right hip: Secondary | ICD-10-CM | POA: Diagnosis not present

## 2017-06-03 LAB — ANAEROBIC CULTURE

## 2017-06-03 LAB — CULTURE, BLOOD (ROUTINE X 2)
Culture: NO GROWTH
Culture: NO GROWTH
SPECIAL REQUESTS: ADEQUATE
SPECIAL REQUESTS: ADEQUATE

## 2017-06-03 NOTE — Progress Notes (Signed)
Office Visit Note   Patient: Derek Conner           Date of Birth: [birth year]           MRN: [id number] Visit Date: 06/02/2017 Requested by: Esperanza Richters, PA-C [id number] [place], [place] [address] PCP: Marisue Brooklyn  Subjective: Chief Complaint  Patient presents with  . Right Hip - Pain    HPI: Lantz is a 56 year old patient whom I saw in the [facility] urgency room last week.  Reported H medical onset of right hip pain for 2 weeks.  In the emergency room the hip was aspirated and the white count was 10,000.  Cultures remained negative.  He was placed on pain medicine anti-inflammatory and muscle relaxer.  States that it's hard for him to weight-bear on the hip but he does not have constant pain.  Denies any groin pain.  Denies any discrete back pain.  He has not had any fevers or chills.  Notably she does have a history of having rheumatoid arthritis affecting primarily his hands.  This is diagnosed by rheumatologist 10 years ago.  He is on Humira for 2 years but stopped in [place] felt better after that.  He is still working.  He denies any other orthopedic joint complaints at this time              ROS: All systems reviewed are negative as they relate to the chief complaint within the history of present illness.  Patient denies  fevers or chills.   Assessment & Plan: Visit Diagnoses:  1. Pain in right hip     Plan: Impression is right hip pain with effusion with negative culture and no white count above 25,000 in the hip joint aspirate.  This looks like it may be a rheumatoid arthritis flare in the hip.  Plan is aspiration again of the hip with Dr. Kristeen Mans tomorrow with injection of cortisone.  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  I'll see him back in 2 weeks for follow-up.  May need to consider MRI scanning at that time if symptoms persist.  Infection much less likely at this time.  Follow-Up Instructions: Return in about 2  weeks (around 06/16/2017).   Orders:  Orders Placed This Encounter  Procedures  . Ambulatory referral to Physical Medicine Rehab   No orders of the defined types were placed in this encounter.     Procedures: No procedures performed   Clinical Data: No additional findings.  Objective: Vital Signs: There were no vitals taken for this visit.  Physical Exam:   Constitutional: Patient appears well-developed HEENT:  Head: Normocephalic Eyes:EOM are normal Neck: Normal range of motion Cardiovascular: Normal rate Pulmonary/chest: Effort normal Neurologic: Patient is alert Skin: Skin is warm Psychiatric: Patient has normal mood and affect    Ortho Exam: Orthopedic exam demonstrates no real irritability of the right hip with internal/external rotation as well as circumduction.  Motor sensory function to the feet intact.  Slightly antalgic gait to the right.  No trochanteric tenderness is noted and no masses noted in the right hip region  Specialty Comments:  No specialty comments available.  Imaging: Xr C-arm No Report  Result Date: 06/03/2017 Please see Notes or Procedures tab for imaging impression.    PMFS History: Patient Active Problem List   Diagnosis Date Noted  . Nocturnal hypoxemia 04/03/2017  . Fatigue 01/09/2017  . Oral thrush 12/12/2016  . Brain  aneurysm 03/27/2016  . Preop respiratory exam 03/26/2016  . Aneurysm (HCC)   . Chronic obstructive pulmonary disease with acute exacerbation (HCC) 01/19/2016  . Community acquired pneumonia 01/19/2016  . Cough syncope 01/19/2016  . Wellness examination 08/29/2015  . Hyperglycemia 01/06/2015  . HTN (hypertension) 12/09/2014  . COPD exacerbation (HCC) 07/08/2014  . Atypical chest pain 07/08/2014  . 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 Dry mouth 12/26/2013  . Cigarette smoker 12/26/2013  . Nodule of left lung 12/26/2013  . COPD, severity to be determined (HCC) 12/26/2013  . Pulmonary nodule, right 12/06/2013    . Hemoptysis 12/03/2013  . COPD (chronic obstructive pulmonary disease) (HCC) 12/03/2013  . Acute bronchitis 12/03/2013  . Tobacco use disorder 12/03/2013   Past Medical History:  Diagnosis Date  . Asthma   . CAP (community acquired pneumonia) 01/19/2016  . Chronic bronchitis (HCC)   . COPD (chronic obstructive pulmonary disease) (HCC)   . Emphysema lung (HCC)   . Emphysema of lung (HCC)   . Hepatitis A infection ~ 1980  . High cholesterol   . Hypertension   . Pneumonia 1960s  . Rheumatoid arthritis (HCC)    "hands mostly"  ( 01/19/2016)  . Situational depression 2009-12-02   "only when my mother died"    Family History  Problem Relation Age of Onset  . Cancer Mother   . Arthritis Mother   . Stroke Father   . Hypertension Father   . Colon cancer Neg Hx     Past Surgical History:  Procedure Laterality Date  . APPENDECTOMY  1980's  . CHEST TUBE INSERTION Left 1990's   "lung collapsed"  . CYSTECTOMY Left 1960's   "wrist"  . FOOT FRACTURE SURGERY Left ~ 12/03/03   "it was crushed"  . IR GENERIC HISTORICAL  03/27/2016   IR ANGIOGRAM FOLLOW UP STUDY 03/27/2016 Julieanne Cotton, MD [facility]  . IR GENERIC HISTORICAL  03/27/2016   IR ANGIOGRAM FOLLOW UP STUDY 03/27/2016 Julieanne Cotton, MD [facility]  . IR GENERIC HISTORICAL  03/27/2016   IR ANGIO INTRA EXTRACRAN SEL COM CAROTID INNOMINATE UNI L MOD SED 03/27/2016 Julieanne Cotton, MD [facility]  . IR GENERIC HISTORICAL  03/27/2016   IR ANGIOGRAM FOLLOW UP STUDY 03/27/2016 Julieanne Cotton, MD [facility]  . IR GENERIC HISTORICAL  03/27/2016   IR ANGIO VERTEBRAL SEL SUBCLAVIAN INNOMINATE UNI R MOD SED 03/27/2016 Julieanne Cotton, MD [facility]  . IR GENERIC HISTORICAL  03/27/2016   IR 3D INDEPENDENT WKST 03/27/2016 Julieanne Cotton, MD [facility]  . IR GENERIC HISTORICAL  03/27/2016   IR ANGIOGRAM FOLLOW UP STUDY 03/27/2016 Julieanne Cotton, MD [facility]  . IR GENERIC HISTORICAL  03/27/2016   IR NEURO EACH ADD'L  AFTER BASIC UNI RIGHT (MS) 03/27/2016 Julieanne Cotton, MD [facility]  . IR GENERIC HISTORICAL  03/27/2016   IR ANGIOGRAM FOLLOW UP STUDY 03/27/2016 Julieanne Cotton, MD [facility]  . IR GENERIC HISTORICAL  03/27/2016   IR TRANSCATH/EMBOLIZ 03/27/2016 Julieanne Cotton, MD [facility]  . IR GENERIC HISTORICAL  03/27/2016   IR ANGIO INTRA EXTRACRAN SEL INTERNAL CAROTID UNI R MOD SED 03/27/2016 Julieanne Cotton, MD [facility]  . IR GENERIC HISTORICAL  03/27/2016   IR ANGIOGRAM FOLLOW UP STUDY 03/27/2016 Julieanne Cotton, MD [facility]  . IR GENERIC HISTORICAL  04/12/2016   IR RADIOLOGIST EVAL & MGMT 04/12/2016 [facility]  . RADIOLOGY WITH ANESTHESIA N/A 02/29/2016   Procedure: EMBOLIZATION     (RADIOLOGY WITH ANESTHESIA);  Surgeon: Julieanne Cotton,  MD;  Location: [facility];  Service: Radiology;  Laterality: N/A;  . RADIOLOGY WITH ANESTHESIA N/A 03/27/2016   Procedure: Embolization;  Surgeon: Julieanne Cotton, MD;  Location: [facility];  Service: Radiology;  Laterality: N/A;  . VIDEO BRONCHOSCOPY Bilateral 12/06/2013   Procedure: VIDEO BRONCHOSCOPY WITHOUT FLUORO;  Surgeon: Alyson Reedy, MD;  Location: [facility] ENDOSCOPY;  Service: Cardiopulmonary;  Laterality: Bilateral;   Social History   Occupational History  . Not on file.   Social History Main Topics  . Smoking status: Current Every Day Smoker    Packs/day: 1.00    Years: 40.00    Types: Cigarettes  . Smokeless tobacco: Never Used     Comment: smoking 1/2ppd as of 05/23/17  ep  . Alcohol use 6.0 oz/week    10 Cans of beer per week     Comment: 12/04/2013 "2beers/day"  . Drug use: No     Comment: 01/19/2016 "nothing in the last 3 months"  . Sexual activity: Yes

## 2017-06-03 NOTE — Progress Notes (Deleted)
Right hip pain- here for right hip aspiration and possible injection.

## 2017-06-03 NOTE — Patient Instructions (Signed)

## 2017-06-03 NOTE — Progress Notes (Signed)
Derek Conner - 57 y.o. male MRN [id number]  Date of birth: 08-Mar-1961  Office Visit Note: Visit Date: 06/03/2017 PCP: Esperanza Richters, PA-C Referred by: Esperanza Richters, PA-C  Subjective: Chief Complaint  Patient presents with  . Right Hip - Pain   HPI: Mr. Nesci is a 56 year old male with chronic worsening right hip pain.  Recent aspiration at another facility obtained several milliliters of joint fluid.  Dr. August Saucer spoke to me about this patient directly.  He states that joint fluid aspirated had an increased number of cells but no bacteria was seen or grown in culture.  Dr. August Saucer requested repeat hip injection and aspiration with the Marcaine and steroid.    ROS Otherwise per HPI.  Assessment & Plan: Visit Diagnoses:  1. Pain in right hip     Plan: Findings:  Stick #2 therapeutic anesthetic hip arthrogram.  We actually were not able to aspirate any fluid at this time.  We did get good flow of contrast into the joint arthrogram.  There was no extravasation.  The patient did get some relief during the anesthetic phase of the injection.    Meds & Orders: No orders of the defined types were placed in this encounter.  Orders Placed This Encounter  Procedures  . XR C-ARM NO REPORT    Follow-up: No Follow-up on file.   Procedures: Diagnostic and therapeutic anesthetic hip arthrogram Date/Time: 06/03/2017 9:57 AM Performed by: Tyrell Antonio Authorized by: Tyrell Antonio   Consent Given by:  Patient Site marked: the procedure site was marked   Timeout: prior to procedure the correct patient, procedure, and site was verified   Indications:  Pain and diagnostic evaluation Location:  Hip Site:  R hip joint Prep: patient was prepped and draped in usual sterile fashion   Needle Size:  22 G Approach:  Anterior Ultrasound Guidance: No   Fluoroscopic Guidance: No   Arthrogram: Yes   Medications:  3 mL bupivacaine 0.5 %; 80 mg triamcinolone acetonide 40  MG/ML Aspiration Attempted: Yes   Patient tolerance:  Patient tolerated the procedure well with no immediate complications  Arthrogram demonstrated excellent flow of contrast throughout the joint surface without extravasation or obvious defect.  The patient had relief of symptoms during the anesthetic phase of the injection.      No notes on file   Clinical History: No specialty comments available.  He reports that he has been smoking Cigarettes.  He has a 40.00 pack-year smoking history. He has never used smokeless tobacco.  Recent Labs  03/26/17 0731  HGBA1C 6.5    Objective:  VS:  HT:    WT:   BMI:     BP:   HR: bpm  TEMP: ( )  RESP:  Physical Exam  Musculoskeletal:  Patient is ambulating with crutches.  He does have painful range of motion on the right.    Ortho Exam Imaging: No results found.  Past Medical/Family/Surgical/Social History: Medications & Allergies reviewed per EMR Patient Active Problem List   Diagnosis Date Noted  . Nocturnal hypoxemia 04/03/2017  . Fatigue 01/09/2017  . Oral thrush 12/12/2016  . Brain aneurysm 03/27/2016  . Preop respiratory exam 03/26/2016  . Aneurysm (HCC)   . Chronic obstructive pulmonary disease with acute exacerbation (HCC) 01/19/2016  . Community acquired pneumonia 01/19/2016  . Cough syncope 01/19/2016  . Wellness examination 08/29/2015  . Hyperglycemia 01/06/2015  . HTN (hypertension) 12/09/2014  . COPD exacerbation (HCC) 07/08/2014  . Atypical chest pain 07/08/2014  .  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 Dry mouth 12/26/2013  . Cigarette smoker 12/26/2013  . Nodule of left lung 12/26/2013  . COPD, severity to be determined (HCC) 12/26/2013  . Pulmonary nodule, right 12/06/2013  . Hemoptysis 12/03/2013  . COPD (chronic obstructive pulmonary disease) (HCC) 12/03/2013  . Acute bronchitis 12/03/2013  . Tobacco use disorder 12/03/2013   Past Medical History:  Diagnosis Date  . Asthma   . CAP (community acquired  pneumonia) 01/19/2016  . Chronic bronchitis (HCC)   . COPD (chronic obstructive pulmonary disease) (HCC)   . Emphysema lung (HCC)   . Emphysema of lung (HCC)   . Hepatitis A infection ~ 1980  . High cholesterol   . Hypertension   . Pneumonia 1960s  . Rheumatoid arthritis (HCC)    "hands mostly"  ( 01/19/2016)  . Situational depression 11/27/09   "only when my mother died"   Family History  Problem Relation Age of Onset  . Cancer Mother   . Arthritis Mother   . Stroke Father   . Hypertension Father   . Colon cancer Neg Hx    Past Surgical History:  Procedure Laterality Date  . APPENDECTOMY  1980's  . CHEST TUBE INSERTION Left 1990's   "lung collapsed"  . CYSTECTOMY Left 1960's   "wrist"  . FOOT FRACTURE SURGERY Left ~ 11/28/03   "it was crushed"  . IR GENERIC HISTORICAL  03/27/2016   IR ANGIOGRAM FOLLOW UP STUDY 03/27/2016 Julieanne Cotton, MD [facility]  . IR GENERIC HISTORICAL  03/27/2016   IR ANGIOGRAM FOLLOW UP STUDY 03/27/2016 Julieanne Cotton, MD [facility]  . IR GENERIC HISTORICAL  03/27/2016   IR ANGIO INTRA EXTRACRAN SEL COM CAROTID INNOMINATE UNI L MOD SED 03/27/2016 Julieanne Cotton, MD [facility]  . IR GENERIC HISTORICAL  03/27/2016   IR ANGIOGRAM FOLLOW UP STUDY 03/27/2016 Julieanne Cotton, MD [facility]  . IR GENERIC HISTORICAL  03/27/2016   IR ANGIO VERTEBRAL SEL SUBCLAVIAN INNOMINATE UNI R MOD SED 03/27/2016 Julieanne Cotton, MD [facility]  . IR GENERIC HISTORICAL  03/27/2016   IR 3D INDEPENDENT WKST 03/27/2016 Julieanne Cotton, MD [facility]  . IR GENERIC HISTORICAL  03/27/2016   IR ANGIOGRAM FOLLOW UP STUDY 03/27/2016 Julieanne Cotton, MD [facility]  . IR GENERIC HISTORICAL  03/27/2016   IR NEURO EACH ADD'L AFTER BASIC UNI RIGHT (MS) 03/27/2016 Julieanne Cotton, MD [facility]  . IR GENERIC HISTORICAL  03/27/2016   IR ANGIOGRAM FOLLOW UP STUDY 03/27/2016 Julieanne Cotton, MD [facility]  . IR GENERIC HISTORICAL  03/27/2016   IR TRANSCATH/EMBOLIZ  03/27/2016 Julieanne Cotton, MD [facility]  . IR GENERIC HISTORICAL  03/27/2016   IR ANGIO INTRA EXTRACRAN SEL INTERNAL CAROTID UNI R MOD SED 03/27/2016 Julieanne Cotton, MD [facility]  . IR GENERIC HISTORICAL  03/27/2016   IR ANGIOGRAM FOLLOW UP STUDY 03/27/2016 Julieanne Cotton, MD [facility]  . IR GENERIC HISTORICAL  04/12/2016   IR RADIOLOGIST EVAL & MGMT 04/12/2016 [facility]  . RADIOLOGY WITH ANESTHESIA N/A 02/29/2016   Procedure: EMBOLIZATION     (RADIOLOGY WITH ANESTHESIA);  Surgeon: Julieanne Cotton, MD;  [facility];  Service: Radiology;  Laterality: N/A;  . RADIOLOGY WITH ANESTHESIA N/A 03/27/2016   Procedure: Embolization;  Surgeon: Julieanne Cotton, MD;  Location: [facility] OR;  Service: Radiology;  Laterality: N/A;  . VIDEO BRONCHOSCOPY Bilateral 12/06/2013   Procedure: VIDEO BRONCHOSCOPY WITHOUT FLUORO;  Surgeon: Alyson Reedy, MD;  Location: [facility] ENDOSCOPY;  Service: Cardiopulmonary;  Laterality: Bilateral;  Social History   Occupational History  . Not on file.   Social History Main Topics  . Smoking status: Current Every Day Smoker    Packs/day: 1.00    Years: 40.00    Types: Cigarettes  . Smokeless tobacco: Never Used     Comment: smoking 1/2ppd as of 05/23/17  ep  . Alcohol use 6.0 oz/week    10 Cans of beer per week     Comment: 12/04/2013 "2beers/day"  . Drug use: No     Comment: 01/19/2016 "nothing in the last 3 months"  . Sexual activity: Yes

## 2017-06-04 MED ORDER — BUPIVACAINE HCL 0.5 % IJ SOLN
3.0000 mL | INTRAMUSCULAR | Status: AC | PRN
  Administered 2017-06-03: 3 mL via INTRA_ARTICULAR

## 2017-06-04 MED ORDER — TRIAMCINOLONE ACETONIDE 40 MG/ML IJ SUSP
80.0000 mg | INTRAMUSCULAR | Status: AC | PRN
  Administered 2017-06-03: 80 mg via INTRA_ARTICULAR

## 2017-06-05 ENCOUNTER — Other Ambulatory Visit: Payer: Self-pay | Admitting: Acute Care

## 2017-06-05 MED FILL — BRILINTA 90 MG TABLET: 90 | 30 days supply | Qty: 60 | Fill #0

## 2017-06-05 MED FILL — AMLODIPINE BESYLATE 10 MG T: 10 | 90 days supply | Qty: 90 | Fill #1

## 2017-06-05 MED FILL — DALIRESP 500 MCG TABLET: 500 | 30 days supply | Qty: 30 | Fill #0

## 2017-06-05 MED FILL — FAMOTIDINE 20 MG TABLET: 20 | 90 days supply | Qty: 90 | Fill #1

## 2017-06-07 DIAGNOSIS — J449 Chronic obstructive pulmonary disease, unspecified: Secondary | ICD-10-CM | POA: Diagnosis not present

## 2017-06-09 MED FILL — VENTOLIN HFA 90 MCG INHALER: 108 (90 BAS | 25 days supply | Qty: 18 | Fill #0

## 2017-06-12 ENCOUNTER — Encounter (INDEPENDENT_AMBULATORY_CARE_PROVIDER_SITE_OTHER): Payer: Self-pay | Admitting: Orthopedic Surgery

## 2017-06-12 ENCOUNTER — Ambulatory Visit (INDEPENDENT_AMBULATORY_CARE_PROVIDER_SITE_OTHER): Payer: 59 | Admitting: Orthopedic Surgery

## 2017-06-12 DIAGNOSIS — M25551 Pain in right hip: Secondary | ICD-10-CM | POA: Diagnosis not present

## 2017-06-12 MED ORDER — HYDROCODONE-ACETAMINOPHEN 5-325 MG PO TABS
1.0000 | ORAL_TABLET | Freq: Four times a day (QID) | ORAL | 0 refills | Status: DC | PRN
Start: 2017-06-12 — End: 2017-09-24

## 2017-06-13 LAB — CBC WITH DIFFERENTIAL/PLATELET
Basophils Absolute: 47 cells/uL (ref 0–200)
Basophils Relative: 0.4 %
EOS PCT: 1.2 %
Eosinophils Absolute: 140 cells/uL (ref 15–500)
HEMATOCRIT: 40.2 % (ref 38.5–50.0)
Hemoglobin: 13.6 g/dL (ref 13.2–17.1)
LYMPHS ABS: 2328 {cells}/uL (ref 850–3900)
MCH: 29.4 pg (ref 27.0–33.0)
MCHC: 33.8 g/dL (ref 32.0–36.0)
MCV: 86.8 fL (ref 80.0–100.0)
MPV: 9.6 fL (ref 7.5–12.5)
Monocytes Relative: 8.6 %
NEUTROS PCT: 69.9 %
Neutro Abs: 8178 cells/uL — ABNORMAL HIGH (ref 1500–7800)
Platelets: 472 10*3/uL — ABNORMAL HIGH (ref 140–400)
RBC: 4.63 10*6/uL (ref 4.20–5.80)
RDW: 12.8 % (ref 11.0–15.0)
TOTAL LYMPHOCYTE: 19.9 %
WBC mixed population: 1006 cells/uL — ABNORMAL HIGH (ref 200–950)
WBC: 11.7 10*3/uL — AB (ref 3.8–10.8)

## 2017-06-13 LAB — C-REACTIVE PROTEIN: CRP: 8.3 mg/L — AB (ref <8.0)

## 2017-06-13 LAB — URIC ACID: URIC ACID, SERUM: 6.7 mg/dL (ref 4.0–8.0)

## 2017-06-13 LAB — RHEUMATOID FACTOR: RHEUMATOID FACTOR: 96 [IU]/mL — AB (ref <14)

## 2017-06-13 LAB — SEDIMENTATION RATE: SED RATE: 14 mm/h (ref 0–20)

## 2017-06-13 LAB — ANA: Anti Nuclear Antibody(ANA): NEGATIVE

## 2017-06-13 LAB — CYCLIC CITRUL PEPTIDE ANTIBODY, IGG: Cyclic Citrullin Peptide Ab: >250 UNITS — ABNORMAL HIGH

## 2017-06-13 NOTE — Progress Notes (Addendum)
Office Visit Note   Patient: Derek Conner           Date of Birth: 19-Dec-1960           MRN: [id number] Visit Date: 06/12/2017 Requested by: Derek Pai, PA-C [place],  [id number] PCP: Elise Benne  Subjective: Chief Complaint  Patient presents with  . Right Hip - Follow-up    HPI: [name] is a patient with right hip pain.  Has remote history of rheumatoid arthritis but is not currently taking any Biologics for that.  Since I have seen him he has had an injection into the hip joint with Dr. Ernestina Patches which has made him about 75% better.  He still has to use crutches at times.  He is unable to lay on the right side or his back.  No aspiration was possible because there was no hip joint effusion.  He ran out of pain medicine.  He is not taking anti-inflammatories.  He denies any fevers or chills.  He does have a history of taking a lot of steroids for COPD.  He still is requiring crutches but does not report any back pain.              ROS: All systems reviewed are negative as they relate to the chief complaint within the history of present illness.  Patient denies  fevers or chills.   Assessment & Plan: Visit Diagnoses:  1. Pain in right hip     Plan: Impression is right hip pain which does not appear to be infectious at this time.  This is likely representing rheumatoid problem.  Need to confirm absence of non-radiographically apparent avascular necrosis with MRI scan of pelvis.  Also would like to draw sedimentation rate C-reactive protein CBC differential rheumatoid fracture and ANA and anti-CCP antibody and uric acid with likely follow-up to a rheumatologist  Follow-Up Instructions: Return for after MRI.   Orders:  Orders Placed This Encounter  Procedures  . MR Pelvis w/o contrast  . Sed Rate (ESR)  . C-reactive protein  . CBC with Differential  . Rheumatoid factor  . Antinuclear Antib (ANA)  . Cyclic citrul peptide antibody,  IgG  . Uric acid   Meds ordered this encounter  Medications  . HYDROcodone-acetaminophen (NORCO) 5-325 MG tablet    Sig: Take 1 tablet every 6 (six) hours as needed by mouth for moderate pain or severe pain.    Dispense:  12 tablet    Refill:  0      Procedures: No procedures performed   Clinical Data: No additional findings.  Objective: Vital Signs: There were no vitals taken for this visit.  Physical Exam:   Constitutional: Patient appears well-developed HEENT:  Head: Normocephalic Eyes:EOM are normal Neck: Normal range of motion Cardiovascular: Normal rate Pulmonary/chest: Effort normal Neurologic: Patient is alert Skin: Skin is warm Psychiatric: Patient has normal mood and affect    Ortho Exam: Orthopedic exam demonstrates good hip flexion abduction and adduction strength with some groin pain on the right with internal/external rotation.  No other masses lymph adenopathy or skin changes noted in the right hip region.  Patient has good ankle dorsi flexion plantar flexion quite hamstring strength without paresthesias.  No left-sided hip symptoms present  Specialty Comments:  No specialty comments available.  Imaging: No results found.   PMFS History: Patient Active Problem List   Diagnosis Date Noted  . Nocturnal hypoxemia 04/03/2017  . Fatigue 01/09/2017  .  Oral thrush 12/12/2016  . Brain aneurysm 03/27/2016  . Preop respiratory exam 03/26/2016  . Aneurysm ([place])   . Chronic obstructive pulmonary disease with acute exacerbation ([place]) 01/19/2016  . Community acquired pneumonia 01/19/2016  . Cough syncope 01/19/2016  . Wellness examination 08/29/2015  . Hyperglycemia 01/06/2015  . HTN (hypertension) 12/09/2014  . COPD exacerbation ([place]) 07/08/2014  . Atypical chest pain 07/08/2014  . COPD, severe ([place]) 02/15/2014  . Dry mouth 12/26/2013  . Cigarette smoker 12/26/2013  . Nodule of left lung 12/26/2013  . COPD, severity to be determined ([place])  12/26/2013  . Pulmonary nodule, right 12/06/2013  . Hemoptysis 12/03/2013  . COPD (chronic obstructive pulmonary disease) ([place]) 12/03/2013  . Acute bronchitis 12/03/2013  . Tobacco use disorder 12/03/2013   Past Medical History:  Diagnosis Date  . Asthma   . CAP (community acquired pneumonia) 01/19/2016  . Chronic bronchitis ([place])   . COPD (chronic obstructive pulmonary disease) ([place])   . Emphysema lung ([place])   . Emphysema of lung ([place])   . Hepatitis A infection ~ 1980  . High cholesterol   . Hypertension   . Pneumonia 1960s  . Rheumatoid arthritis ([place])    "hands mostly"  ( 01/19/2016)  . Situational depression 10/24/2009   "only when my mother died"    Family History  Problem Relation Age of Onset  . Cancer Mother   . Arthritis Mother   . Stroke Father   . Hypertension Father   . Colon cancer Neg Hx     Past Surgical History:  Procedure Laterality Date  . APPENDECTOMY  1980's  . CHEST TUBE INSERTION Left 1990's   "lung collapsed"  . CYSTECTOMY Left 1960's   "wrist"  . FOOT FRACTURE SURGERY Left ~ 10-25-2003   "it was crushed"  . IR GENERIC HISTORICAL  03/27/2016   IR ANGIOGRAM FOLLOW UP STUDY 03/27/2016 Luanne Bras, MD [facility]  . IR GENERIC HISTORICAL  03/27/2016   IR ANGIOGRAM FOLLOW UP STUDY 03/27/2016 Luanne Bras, MD [facility]  . IR GENERIC HISTORICAL  03/27/2016   IR ANGIO INTRA EXTRACRAN SEL COM CAROTID INNOMINATE UNI L MOD SED 03/27/2016 Luanne Bras, MD [facility]  . IR GENERIC HISTORICAL  03/27/2016   IR ANGIOGRAM FOLLOW UP STUDY 03/27/2016 Luanne Bras, MD [facility]  . IR GENERIC HISTORICAL  03/27/2016   IR ANGIO VERTEBRAL SEL SUBCLAVIAN INNOMINATE UNI R MOD SED 03/27/2016 Luanne Bras, MD [facility]  . IR GENERIC HISTORICAL  03/27/2016   IR 3D INDEPENDENT WKST 03/27/2016 Luanne Bras, MD [facility]  . IR GENERIC HISTORICAL  03/27/2016   IR ANGIOGRAM FOLLOW UP STUDY 03/27/2016 Luanne Bras, MD [facility]  . IR  GENERIC HISTORICAL  03/27/2016   IR NEURO EACH ADD'L AFTER BASIC UNI RIGHT (MS) 03/27/2016 Luanne Bras, MD [facility]  . IR GENERIC HISTORICAL  03/27/2016   IR ANGIOGRAM FOLLOW UP STUDY 03/27/2016 Luanne Bras, MD [facility]  . IR GENERIC HISTORICAL  03/27/2016   IR TRANSCATH/EMBOLIZ 03/27/2016 Luanne Bras, MD [facility]  . IR GENERIC HISTORICAL  03/27/2016   IR ANGIO INTRA EXTRACRAN SEL INTERNAL CAROTID UNI R MOD SED 03/27/2016 Luanne Bras, MD [facility]  . IR GENERIC HISTORICAL  03/27/2016   IR ANGIOGRAM FOLLOW UP STUDY 03/27/2016 Luanne Bras, MD [facility]  . IR GENERIC HISTORICAL  04/12/2016   IR RADIOLOGIST EVAL & MGMT 04/12/2016 [facility]   Social History   Occupational History  . Not on file  Tobacco Use  .  Smoking status: Current Every Day Smoker    Packs/day: 1.00    Years: 40.00    Pack years: 40.00    Types: Cigarettes  . Smokeless tobacco: Never Used  . Tobacco comment: smoking 1/2ppd as of 05/23/17  ep  Substance and Sexual Activity  . Alcohol use: Yes    Alcohol/week: 6.0 oz    Types: 10 Cans of beer per week    Comment: 12/04/2013 "2beers/day"  . Drug use: No    Comment: 01/19/2016 "nothing in the last 3 months"  . Sexual activity: Yes

## 2017-06-16 MED FILL — FENOFIBRATE 48 MG TABLET: 48 | 30 days supply | Qty: 30 | Fill #2

## 2017-06-16 MED FILL — HYDROCODON-APAP 5-325: 5-325 | 3 days supply | Qty: 12 | Fill #0

## 2017-06-21 ENCOUNTER — Ambulatory Visit
Admission: RE | Admit: 2017-06-21 | Discharge: 2017-06-21 | Disposition: A | Discharge status: Home / Self Care | Payer: 59 | Source: Ambulatory Visit | Attending: Orthopedic Surgery | Admitting: Orthopedic Surgery

## 2017-06-21 DIAGNOSIS — M25551 Pain in right hip: Secondary | ICD-10-CM

## 2017-06-21 DIAGNOSIS — M25451 Effusion, right hip: Secondary | ICD-10-CM | POA: Diagnosis not present

## 2017-06-22 ENCOUNTER — Emergency Department (HOSPITAL_BASED_OUTPATIENT_CLINIC_OR_DEPARTMENT_OTHER)
Admission: EM | Admit: 2017-06-22 | Discharge: 2017-06-22 | Disposition: A | Discharge status: Home / Self Care | Payer: 59 | Attending: Emergency Medicine | Admitting: Emergency Medicine

## 2017-06-22 ENCOUNTER — Encounter (HOSPITAL_BASED_OUTPATIENT_CLINIC_OR_DEPARTMENT_OTHER): Payer: Self-pay | Admitting: Emergency Medicine

## 2017-06-22 ENCOUNTER — Other Ambulatory Visit: Payer: Self-pay

## 2017-06-22 ENCOUNTER — Emergency Department (HOSPITAL_BASED_OUTPATIENT_CLINIC_OR_DEPARTMENT_OTHER): Payer: 59

## 2017-06-22 DIAGNOSIS — R05 Cough: Secondary | ICD-10-CM | POA: Diagnosis not present

## 2017-06-22 DIAGNOSIS — I10 Essential (primary) hypertension: Secondary | ICD-10-CM | POA: Insufficient documentation

## 2017-06-22 DIAGNOSIS — Z7982 Long term (current) use of aspirin: Secondary | ICD-10-CM | POA: Diagnosis not present

## 2017-06-22 DIAGNOSIS — R0602 Shortness of breath: Secondary | ICD-10-CM | POA: Diagnosis not present

## 2017-06-22 DIAGNOSIS — J45909 Unspecified asthma, uncomplicated: Secondary | ICD-10-CM | POA: Diagnosis not present

## 2017-06-22 DIAGNOSIS — J441 Chronic obstructive pulmonary disease with (acute) exacerbation: Secondary | ICD-10-CM

## 2017-06-22 DIAGNOSIS — Z79899 Other long term (current) drug therapy: Secondary | ICD-10-CM | POA: Diagnosis not present

## 2017-06-22 DIAGNOSIS — F17210 Nicotine dependence, cigarettes, uncomplicated: Secondary | ICD-10-CM | POA: Insufficient documentation

## 2017-06-22 LAB — CBC WITH DIFFERENTIAL/PLATELET
Basophils Absolute: 0 10*3/uL (ref 0.0–0.1)
Basophils Relative: 0 %
Eosinophils Absolute: 0.2 10*3/uL (ref 0.0–0.7)
Eosinophils Relative: 2 %
HCT: 41.6 % (ref 39.0–52.0)
Hemoglobin: 13.5 g/dL (ref 13.0–17.0)
Lymphocytes Relative: 33 %
Lymphs Abs: 3.1 10*3/uL (ref 0.7–4.0)
MCH: 29 pg (ref 26.0–34.0)
MCHC: 32.5 g/dL (ref 30.0–36.0)
MCV: 89.3 fL (ref 78.0–100.0)
Monocytes Absolute: 1 10*3/uL (ref 0.1–1.0)
Monocytes Relative: 11 %
Neutro Abs: 5 10*3/uL (ref 1.7–7.7)
Neutrophils Relative %: 54 %
Platelets: 355 10*3/uL (ref 150–400)
RBC: 4.66 MIL/uL (ref 4.22–5.81)
RDW: 13.6 % (ref 11.5–15.5)
WBC: 9.3 10*3/uL (ref 4.0–10.5)

## 2017-06-22 LAB — BASIC METABOLIC PANEL
Anion gap: 9 (ref 5–15)
BUN: 13 mg/dL (ref 6–20)
CO2: 23 mmol/L (ref 22–32)
Calcium: 9.3 mg/dL (ref 8.9–10.3)
Chloride: 104 mmol/L (ref 101–111)
Creatinine, Ser: 1.04 mg/dL (ref 0.61–1.24)
GFR calc Af Amer: >60 mL/min (ref >60)
GFR calc non Af Amer: >60 mL/min (ref >60)
Glucose, Bld: 127 mg/dL — ABNORMAL HIGH (ref 65–99)
Potassium: 3.3 mmol/L — ABNORMAL LOW (ref 3.5–5.1)
Sodium: 136 mmol/L (ref 135–145)

## 2017-06-22 MED ORDER — PROMETHAZINE-DM 6.25-15 MG/5ML PO SYRP: 5.0000 mL | ORAL_SOLUTION | Freq: Four times a day (QID) | ORAL | 0 refills | Status: DC | PRN

## 2017-06-22 MED ORDER — PREDNISONE 50 MG PO TABS: 50.0000 mg | ORAL_TABLET | Freq: Every day | ORAL | 0 refills | Status: DC

## 2017-06-22 MED ORDER — PREDNISONE 50 MG PO TABS
60.0000 mg | ORAL_TABLET | ORAL | Status: AC
  Administered 2017-06-22: 60 mg via ORAL
  Filled 2017-06-22: qty 1

## 2017-06-22 MED ORDER — GUAIFENESIN ER 1200 MG PO TB12: 1.0000 | ORAL_TABLET | Freq: Two times a day (BID) | ORAL | 0 refills | Status: DC

## 2017-06-22 MED ORDER — ALBUTEROL (5 MG/ML) CONTINUOUS INHALATION SOLN
15.0000 mg/h | INHALATION_SOLUTION | RESPIRATORY_TRACT | Status: AC
  Administered 2017-06-22: 15 mg/h via RESPIRATORY_TRACT
  Filled 2017-06-22: qty 20

## 2017-06-22 MED ORDER — ALBUTEROL SULFATE (2.5 MG/3ML) 0.083% IN NEBU
5.0000 mg | INHALATION_SOLUTION | Freq: Once | RESPIRATORY_TRACT | Status: AC
  Administered 2017-06-22: 5 mg via RESPIRATORY_TRACT
  Filled 2017-06-22: qty 6

## 2017-06-22 MED ORDER — DOXYCYCLINE HYCLATE 100 MG PO CAPS: 100.0000 mg | ORAL_CAPSULE | Freq: Two times a day (BID) | ORAL | 0 refills | Status: DC

## 2017-06-22 NOTE — ED Notes (Signed)
Patient ambulated in hall, SpO2 95-97% on room air, HR 115-125, RR 18-22.  Endorses mild +DOE but not like prior to ER visit.

## 2017-06-22 NOTE — ED Provider Notes (Signed)
[facility] [facility] EMERGENCY DEPARTMENT Provider Note   CSN: [id number] Arrival date & time: 06/22/17  1314     History   Chief Complaint Chief Complaint  Patient presents with  . Shortness of Breath    HPI Derek Conner is a 56 y.o. male.  HPI Patient presents to the emergency department with shortness of breath that got worse over the last 2 days the patient states that he has a history of COPD and asthma and has been using his nebulizer treatments more frequently.  The patient states that he woke up each morning with worsening symptoms.  Patient states that nothing seems to make the condition better and activity seems to make it worse.  The patient denies chest pain,  headache,blurred vision, neck pain, fever, cough, weakness, numbness, dizziness, anorexia, edema, abdominal pain, nausea, vomiting, diarrhea, rash, back pain, dysuria, hematemesis, bloody stool, near syncope, or syncope. Past Medical History:  Diagnosis Date  . Asthma   . CAP (community acquired pneumonia) 01/19/2016  . Chronic bronchitis (HCC)   . COPD (chronic obstructive pulmonary disease) (HCC)   . Emphysema lung (HCC)   . Emphysema of lung (HCC)   . Hepatitis A infection ~ 1980  . High cholesterol   . Hypertension   . Pneumonia 1960s  . Rheumatoid arthritis (HCC)    "hands mostly"  ( 01/19/2016)  . Situational depression 21-Nov-2009   "only when my mother died"    Patient Active Problem List   Diagnosis Date Noted  . Nocturnal hypoxemia 04/03/2017  . Fatigue 01/09/2017  . Oral thrush 12/12/2016  . Brain aneurysm 03/27/2016  . Preop respiratory exam 03/26/2016  . Aneurysm (HCC)   . Chronic obstructive pulmonary disease with acute exacerbation (HCC) 01/19/2016  . Community acquired pneumonia 01/19/2016  . Cough syncope 01/19/2016  . Wellness examination 08/29/2015  . Hyperglycemia 01/06/2015  . HTN (hypertension) 12/09/2014  . COPD exacerbation (HCC) 07/08/2014  . Atypical chest pain  07/08/2014  . 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 Dry mouth 12/26/2013  . Cigarette smoker 12/26/2013  . Nodule of left lung 12/26/2013  . COPD, severity to be determined (HCC) 12/26/2013  . Pulmonary nodule, right 12/06/2013  . Hemoptysis 12/03/2013  . COPD (chronic obstructive pulmonary disease) (HCC) 12/03/2013  . Acute bronchitis 12/03/2013  . Tobacco use disorder 12/03/2013    Past Surgical History:  Procedure Laterality Date  . APPENDECTOMY  1980's  . CHEST TUBE INSERTION Left 1990's   "lung collapsed"  . CYSTECTOMY Left 1960's   "wrist"  . Embolization N/A 03/27/2016   Performed by Julieanne Cotton, MD at [facility] OR  . EMBOLIZATION     (RADIOLOGY WITH ANESTHESIA) N/A 02/29/2016   Performed by Julieanne Cotton, MD at [facility] OR  . FOOT FRACTURE SURGERY Left ~ 11-22-2003   "it was crushed"  . IR GENERIC HISTORICAL  03/27/2016   IR ANGIOGRAM FOLLOW UP STUDY 03/27/2016 Julieanne Cotton, MD [facility]  . IR GENERIC HISTORICAL  03/27/2016   IR ANGIOGRAM FOLLOW UP STUDY 03/27/2016 Julieanne Cotton, MD [facility]  . IR GENERIC HISTORICAL  03/27/2016   IR ANGIO INTRA EXTRACRAN SEL COM CAROTID INNOMINATE UNI L MOD SED 03/27/2016 Julieanne Cotton, MD [facility]  . IR GENERIC HISTORICAL  03/27/2016   IR ANGIOGRAM FOLLOW UP STUDY 03/27/2016 Julieanne Cotton, MD [facility]  . IR GENERIC HISTORICAL  03/27/2016   IR ANGIO VERTEBRAL SEL SUBCLAVIAN INNOMINATE UNI R MOD SED 03/27/2016 Julieanne Cotton, MD [facility]  . IR GENERIC HISTORICAL  03/27/2016   IR 3D INDEPENDENT WKST 03/27/2016 Julieanne Cotton, MD [facility]  . IR GENERIC HISTORICAL  03/27/2016   IR ANGIOGRAM FOLLOW UP STUDY 03/27/2016 Julieanne Cotton, MD [facility]  . IR GENERIC HISTORICAL  03/27/2016   IR NEURO EACH ADD'L AFTER BASIC UNI RIGHT (MS) 03/27/2016 Julieanne Cotton, MD [facility]  . IR GENERIC HISTORICAL  03/27/2016   IR ANGIOGRAM FOLLOW UP STUDY 03/27/2016 Julieanne Cotton, MD [facility]  . IR GENERIC  HISTORICAL  03/27/2016   IR TRANSCATH/EMBOLIZ 03/27/2016 Julieanne Cotton, MD [facility]  . IR GENERIC HISTORICAL  03/27/2016   IR ANGIO INTRA EXTRACRAN SEL INTERNAL CAROTID UNI R MOD SED 03/27/2016 Julieanne Cotton, MD [facility]  . IR GENERIC HISTORICAL  03/27/2016   IR ANGIOGRAM FOLLOW UP STUDY 03/27/2016 Julieanne Cotton, MD [facility]  . IR GENERIC HISTORICAL  04/12/2016   IR RADIOLOGIST EVAL & MGMT 04/12/2016 [facility]  . VIDEO BRONCHOSCOPY WITHOUT FLUORO Bilateral 12/06/2013   Performed by Alyson Reedy, MD at [facility] ENDOSCOPY       Home Medications    Prior to Admission medications   Medication Sig Start Date End Date Taking? Authorizing Provider  albuterol (PROVENTIL HFA;VENTOLIN HFA) 108 (90 Base) MCG/ACT inhaler Inhale 2 puffs into the lungs every 4 (four) hours as needed for wheezing or shortness of breath. 11/15/16   Nyoka Cowden, MD  albuterol (PROVENTIL) (2.5 MG/3ML) 0.083% nebulizer solution Take 3 mLs (2.5 mg total) by nebulization every 6 (six) hours as needed for wheezing or shortness of breath. DX: J44.9 04/03/17   Bevelyn Ngo, NP  ALPRAZolam Prudy Feeler) 0.5 MG tablet 1 tab po q day as needed severe anxiety/panic attack. Patient taking differently: Take 0.5 mg by mouth daily as needed for anxiety. 1 tab po q day as needed severe anxiety/panic attack. 10/08/16   Saguier, Ramon Dredge, PA-C  amLODipine (NORVASC) 10 MG tablet TAKE 1 TABLET (10 MG) BY MOUTH DAILY. 03/12/17   Saguier, Ramon Dredge, PA-C  aspirin EC 81 MG tablet Take 81 mg by mouth daily.    [provider]  b complex vitamins tablet Take 1 tablet by mouth daily.    [provider]  cloNIDine (CATAPRES) 0.2 MG tablet Take 1 tablet (0.2 mg total) by mouth 2 (two) times daily. 05/19/16   Minor, Vilinda Blanks, NP  cyclobenzaprine (FLEXERIL) 10 MG tablet Take 1 tablet (10 mg total) by mouth at bedtime. 05/26/17   Saguier, Ramon Dredge, PA-C  DALIRESP 500 MCG TABS tablet TAKE 1 TABLET (500 MCG TOTAL) BY MOUTH DAILY.  06/05/17   Kalman Shan, MD  diclofenac (VOLTAREN) 50 MG EC tablet Take 1 tablet (50 mg total) by mouth 2 (two) times daily. 05/26/17   Saguier, Ramon Dredge, PA-C  famotidine (PEPCID) 20 MG tablet TAKE 1 TABLET (20 MG TOTAL) BY MOUTH AT BEDTIME. 03/18/17   Saguier, Ramon Dredge, PA-C  fenofibrate (TRICOR) 48 MG tablet TAKE 1 TABLET (48 MG TOTAL) BY MOUTH DAILY. 04/15/17   Saguier, Ramon Dredge, PA-C  fluticasone (FLONASE) 50 MCG/ACT nasal spray Place 2 sprays into both nostrils daily. 01/09/17   Bevelyn Ngo, NP  Fluticasone-Salmeterol (ADVAIR DISKUS) 500-50 MCG/DOSE AEPB Inhale 1 puff into the lungs 2 (two) times daily. 01/09/17   Bevelyn Ngo, NP  HYDROcodone-acetaminophen (NORCO) 5-325 MG tablet Take 1 tablet every 6 (six) hours as needed by mouth for moderate pain or severe pain. 06/12/17   Cammy Copa, MD  ibuprofen (ADVIL,MOTRIN) 200 MG tablet Take 400 mg by mouth every 6 (  six) hours as needed for moderate pain.    [provider]  ketorolac (TORADOL) 10 MG tablet Take 1 tablet (10 mg total) by mouth every 8 (eight) hours as needed. 05/29/17   Azalia Bilis, MD  methocarbamol (ROBAXIN) 500 MG tablet Take 1 tablet (500 mg total) by mouth every 8 (eight) hours as needed for muscle spasms. 05/29/17   Azalia Bilis, MD  oxyCODONE-acetaminophen (PERCOCET/ROXICET) 5-325 MG tablet Take 1 tablet by mouth every 4 (four) hours as needed for severe pain. 05/29/17   Azalia Bilis, MD  pantoprazole (PROTONIX) 40 MG tablet Take 1 tablet (40 mg total) by mouth daily at 12 noon. 09/05/16   Saguier, Ramon Dredge, PA-C  ticagrelor (BRILINTA) 90 MG TABS tablet Take 45-90 mg by mouth 2 (two) times daily. 1 in the morning and 1/2 in the evening    [provider]  tiotropium (SPIRIVA HANDIHALER) 18 MCG inhalation capsule PLACE 1 CAPSULE INTO INHALER AND INHALE INTO LUNGS DAILY. 01/09/17   Bevelyn Ngo, NP    Family History Family History  Problem Relation Age of Onset  . Cancer Mother   . Arthritis Mother     . Stroke Father   . Hypertension Father   . Colon cancer Neg Hx     Social History Social History   Tobacco Use  . Smoking status: Current Every Day Smoker    Packs/day: 1.00    Years: 40.00    Pack years: 40.00    Types: Cigarettes  . Smokeless tobacco: Never Used  . Tobacco comment: smoking 1/2ppd as of 05/23/17  ep  Substance Use Topics  . Alcohol use: Yes    Alcohol/week: 6.0 oz    Types: 10 Cans of beer per week    Comment: 12/04/2013 "2beers/day"  . Drug use: No    Comment: 01/19/2016 "nothing in the last 3 months"     Allergies   Symbicort [budesonide-formoterol fumarate]; Dulera [mometasone furo-formoterol fum]; and Humira [adalimumab]   Review of Systems Review of Systems  All other systems negative except as documented in the HPI. All pertinent positives and negatives as reviewed in the HPI. Physical Exam Updated Vital Signs BP (!) 167/102 Comment: after ambulating  Pulse (!) 111 Comment: after ambulating  Temp 98 F (36.7 C)   Resp 18 Comment: after ambulating  Ht 6' (1.829 m)   Wt 77.1 kg (170 lb)   SpO2 95% Comment: after ambulating  BMI 23.06 kg/m   Physical Exam  Constitutional: He is oriented to person, place, and time. He appears well-developed and well-nourished. No distress.  HENT:  Head: Normocephalic and atraumatic.  Mouth/Throat: Oropharynx is clear and moist.  Eyes: Pupils are equal, round, and reactive to light.  Neck: Normal range of motion. Neck supple.  Cardiovascular: Normal rate, regular rhythm and normal heart sounds. Exam reveals no gallop and no friction rub.  No murmur heard. Pulmonary/Chest: Effort normal. No accessory muscle usage or stridor. No apnea and no tachypnea. No respiratory distress. He has no decreased breath sounds. He has wheezes in the right upper field, the right middle field, the right lower field, the left upper field, the left middle field and the left lower field. He has no rhonchi.  Abdominal: Soft. Bowel  sounds are normal. He exhibits no distension. There is no tenderness.  Neurological: He is alert and oriented to person, place, and time. He exhibits normal muscle tone. Coordination normal.  Skin: Skin is warm and dry. Capillary refill takes less than 2 seconds.  No rash noted. No erythema.  Psychiatric: He has a normal mood and affect. His behavior is normal.  Nursing note and vitals reviewed.    ED Treatments / Results  Labs (all labs ordered are listed, but only abnormal results are displayed) Labs Reviewed  BASIC METABOLIC PANEL - Abnormal; Notable for the following components:      Result Value   Potassium 3.3 (*)    Glucose, Bld 127 (*)    All other components within normal limits  CBC WITH DIFFERENTIAL/PLATELET    EKG  EKG Interpretation None       Radiology Dg Chest 2 View  Result Date: 06/22/2017 CLINICAL DATA:  Cough, congestion, shortness of Breath EXAM: CHEST  2 VIEW COMPARISON:  04/24/2017 FINDINGS: There is hyperinflation of the lungs compatible with COPD. Biapical nodular densities are stable. Heart is normal size. No effusions. No acute bony abnormality. IMPRESSION: COPD. Biapical nodular densities, likely scarring. No active disease. Electronically Signed   By: Charlett NoseKevin  Dover M.D.   On: 06/22/2017 15:48   Mr Pelvis W/o Contrast  Result Date: 06/22/2017 CLINICAL DATA:  Right hip pain for the past month. History of rheumatoid arthritis. Evaluate for avascular necrosis. EXAM: MRI PELVIS WITHOUT CONTRAST TECHNIQUE: Multiplanar multisequence MR imaging of the pelvis was performed. No intravenous contrast was administered. COMPARISON:  Right hip CT dated May 29, 2017. FINDINGS: Bones: There is serpiginous T2 hyperintense, T1 hypointense signal in both femoral heads, consistent with avascular necrosis. There is extensive marrow edema in the right femoral head extending into the intertrochanteric region. There is slight subchondral collapse of the superior right femoral  head. No fracture. Mild degenerative marrow edema in the iliac aspect of the right greater than left sacroiliac joints. The pubic symphysis is normal. Articular cartilage and labrum Articular cartilage: There is a full-thickness cartilage loss over the right posterior acetabulum and femoral head with underlying degenerative marrow edema in the acetabulum. Full-thickness cartilage loss over the superior acetabulum with underlying subchondral cystic change. Labrum: Degeneration and irregularity of the right anterior superior labrum. Probable tear of the left superior labrum with adjacent small paralabral cyst. Joint or bursal effusion Joint effusion: Small right hip joint effusion. Bursae: No focal periarticular fluid collection. Muscles and tendons Muscles and tendons: Mild tendinosis and partial tearing of the right gluteus minimus tendon. The left gluteal tendons are unremarkable. The visualized bilateral hamstring and iliopsoas tendons appear normal. The piriformis muscles appear symmetric. Other findings Miscellaneous: The visualized internal pelvic contents appear unremarkable. IMPRESSION: 1. Bilateral femoral head avascular necrosis, worst on the right where there is slight subchondral collapse and extensive marrow edema in the femoral head and neck. 2. Mild to moderate right hip degenerative changes. 3. Small right hip joint effusion, likely reactive. Infection is thought to be less likely, but correlation for any signs or symptoms of septic arthritis is recommended. 4. Degenerative tearing of the right anterior superior and left superior labrum. 5. Right gluteus minimus tendinosis and partial tearing. Electronically Signed   By: Obie DredgeWilliam T Derry M.D.   On: 06/22/2017 16:10    Procedures Procedures (including critical care time)  Medications Ordered in ED Medications  albuterol (PROVENTIL,VENTOLIN) solution continuous neb (0 mg/hr Nebulization Stopped 06/22/17 1600)  albuterol (PROVENTIL) (2.5 MG/3ML)  0.083% nebulizer solution 5 mg (5 mg Nebulization Given 06/22/17 1345)  predniSONE (DELTASONE) tablet 60 mg (60 mg Oral Given 06/22/17 1545)     Initial Impression / Assessment and Plan / ED Course  I have reviewed the triage vital  signs and the nursing notes.  Pertinent labs & imaging results that were available during my care of the patient were reviewed by me and considered in my medical decision making (see chart for details).     Patient has been ambulated here in the emergency department and ambulated without difficulty and maintain oxygenation above 95%.  The patient will be treated with doxycycline along with steroids told to follow-up with his doctor told return here for any worsening in his condition.  She is feeling dramatically better following treatment here in the emergency department do feel that a trial at home is possible at this point.  Final Clinical Impressions(s) / ED Diagnoses   Final diagnoses:  None    ED Discharge Orders    None       Charlestine Night, PA-C 06/22/17 1654    Shaune Pollack, MD 06/23/17 [id number]

## 2017-06-22 NOTE — Discharge Instructions (Signed)
Return here as needed. follow-up with your primary care doctor.

## 2017-06-22 NOTE — ED Notes (Signed)
Hour long neb in progress  

## 2017-06-22 NOTE — ED Triage Notes (Signed)
Pt c/o SHOB since [place], but worse today; used neb at home w/ some relief

## 2017-06-25 ENCOUNTER — Encounter (INDEPENDENT_AMBULATORY_CARE_PROVIDER_SITE_OTHER): Payer: Self-pay | Admitting: Orthopedic Surgery

## 2017-06-25 ENCOUNTER — Ambulatory Visit (INDEPENDENT_AMBULATORY_CARE_PROVIDER_SITE_OTHER): Payer: 59 | Admitting: Orthopedic Surgery

## 2017-06-25 DIAGNOSIS — M87051 Idiopathic aseptic necrosis of right femur: Secondary | ICD-10-CM

## 2017-06-25 DIAGNOSIS — Z7689 Persons encountering health services in other specified circumstances: Secondary | ICD-10-CM

## 2017-06-25 DIAGNOSIS — M87052 Idiopathic aseptic necrosis of left femur: Secondary | ICD-10-CM | POA: Diagnosis not present

## 2017-06-25 NOTE — Progress Notes (Signed)
Office Visit Note   Patient: Derek Conner           Date of Birth: 08-21-1960           MRN: [id number] Visit Date: 06/25/2017 Requested by: Esperanza Richters, PA-C [id number] [place], [place] [address] PCP: Marisue Brooklyn  Subjective: Chief Complaint  Patient presents with  . Follow-up    MRI review    HPI: Derek Conner is a 56 year old patient here for follow-up MRI pelvis.  He states that the pain in general is not quite as bad or intense as it has been.  Had a intra-articular hip injection on the right on November 7.  MRI scan is reviewed with the patient.  He does have avascular necrosis both hips worse on the right.  He's taking anti-inflammatories.  Has history of some alcohol use in the past but most of his risk factors surrounding the steroid use for COPD              ROS: All systems reviewed are negative as they relate to the chief complaint within the history of present illness.  Patient denies  fevers or chills.   Assessment & Plan: Visit Diagnoses:  1. Referral of patient   2. Avascular necrosis of bones of both hips (HCC)     Plan: Impression is bilateral hip avascular necrosis most symptomatic on the right-hand side.  Left hip is not symptomatic.  I like to send him to rheumatologist for evaluation of abnormal labs and his history of rheumatoid arthritis but based on the MRI scan his hip problem is not rheumatoid arthritis related.  His hip problem is avascular necrosis and will need total hip replacement sometime in the future when his symptoms are more severe.  I plan to see him back in 8 weeks for clinical recheck.  He does use his crutches occasionally.  He will know in its time for hip replacement  Follow-Up Instructions: Return in about 8 weeks (around 08/20/2017).   Orders:  Orders Placed This Encounter  Procedures  . Ambulatory referral to Rheumatology   No orders of the defined types were placed in this encounter.      Procedures: No procedures performed   Clinical Data: No additional findings.  Objective: Vital Signs: There were no vitals taken for this visit.  Physical Exam:   Constitutional: Patient appears well-developed HEENT:  Head: Normocephalic Eyes:EOM are normal Neck: Normal range of motion Cardiovascular: Normal rate Pulmonary/chest: Effort normal Neurologic: Patient is alert Skin: Skin is warm Psychiatric: Patient has normal mood and affect    Ortho Exam: Orthopedic exam demonstrates slightly antalgic gait to the right.  Has full symmetric hip range of motion and no pain on the left with only mild pain on the right.  Has good hip flexion abduction and adduction strength and palpable pedal pulses.  Leg lengths also equal.  Specialty Comments:  No specialty comments available.  Imaging: No results found.   PMFS History: Patient Active Problem List   Diagnosis Date Noted  . Nocturnal hypoxemia 04/03/2017  . Fatigue 01/09/2017  . Oral thrush 12/12/2016  . Brain aneurysm 03/27/2016  . Preop respiratory exam 03/26/2016  . Aneurysm (HCC)   . Chronic obstructive pulmonary disease with acute exacerbation (HCC) 01/19/2016  . Community acquired pneumonia 01/19/2016  . Cough syncope 01/19/2016  . Wellness examination 08/29/2015  . Hyperglycemia 01/06/2015  . HTN (hypertension) 12/09/2014  . COPD exacerbation (HCC) 07/08/2014  . Atypical chest pain  07/08/2014  . 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 Dry mouth 12/26/2013  . Cigarette smoker 12/26/2013  . Nodule of left lung 12/26/2013  . COPD, severity to be determined (HCC) 12/26/2013  . Pulmonary nodule, right 12/06/2013  . Hemoptysis 12/03/2013  . COPD (chronic obstructive pulmonary disease) (HCC) 12/03/2013  . Acute bronchitis 12/03/2013  . Tobacco use disorder 12/03/2013   Past Medical History:  Diagnosis Date  . Asthma   . CAP (community acquired pneumonia) 01/19/2016  . Chronic bronchitis (HCC)   . COPD (chronic  obstructive pulmonary disease) (HCC)   . Emphysema lung (HCC)   . Emphysema of lung (HCC)   . Hepatitis A infection ~ 1980  . High cholesterol   . Hypertension   . Pneumonia 1960s  . Rheumatoid arthritis (HCC)    "hands mostly"  ( 01/19/2016)  . Situational depression 11-30-09   "only when my mother died"    Family History  Problem Relation Age of Onset  . Cancer Mother   . Arthritis Mother   . Stroke Father   . Hypertension Father   . Colon cancer Neg Hx     Past Surgical History:  Procedure Laterality Date  . APPENDECTOMY  1980's  . CHEST TUBE INSERTION Left 1990's   "lung collapsed"  . CYSTECTOMY Left 1960's   "wrist"  . FOOT FRACTURE SURGERY Left ~ December 01, 2003   "it was crushed"  . IR GENERIC HISTORICAL  03/27/2016   IR ANGIOGRAM FOLLOW UP STUDY 03/27/2016 Julieanne Cotton, MD [facility]  . IR GENERIC HISTORICAL  03/27/2016   IR ANGIOGRAM FOLLOW UP STUDY 03/27/2016 Julieanne Cotton, MD [facility]  . IR GENERIC HISTORICAL  03/27/2016   IR ANGIO INTRA EXTRACRAN SEL COM CAROTID INNOMINATE UNI L MOD SED 03/27/2016 Julieanne Cotton, MD [facility]  . IR GENERIC HISTORICAL  03/27/2016   IR ANGIOGRAM FOLLOW UP STUDY 03/27/2016 Julieanne Cotton, MD [facility]  . IR GENERIC HISTORICAL  03/27/2016   IR ANGIO VERTEBRAL SEL SUBCLAVIAN INNOMINATE UNI R MOD SED 03/27/2016 Julieanne Cotton, MD [facility]  . IR GENERIC HISTORICAL  03/27/2016   IR 3D INDEPENDENT WKST 03/27/2016 Julieanne Cotton, MD [facility]  . IR GENERIC HISTORICAL  03/27/2016   IR ANGIOGRAM FOLLOW UP STUDY 03/27/2016 Julieanne Cotton, MD [facility]  . IR GENERIC HISTORICAL  03/27/2016   IR NEURO EACH ADD'L AFTER BASIC UNI RIGHT (MS) 03/27/2016 Julieanne Cotton, MD [facility]  . IR GENERIC HISTORICAL  03/27/2016   IR ANGIOGRAM FOLLOW UP STUDY 03/27/2016 Julieanne Cotton, MD [facility]  . IR GENERIC HISTORICAL  03/27/2016   IR TRANSCATH/EMBOLIZ 03/27/2016 Julieanne Cotton, MD [facility]  . IR GENERIC  HISTORICAL  03/27/2016   IR ANGIO INTRA EXTRACRAN SEL INTERNAL CAROTID UNI R MOD SED 03/27/2016 Julieanne Cotton, MD [facility]  . IR GENERIC HISTORICAL  03/27/2016   IR ANGIOGRAM FOLLOW UP STUDY 03/27/2016 Julieanne Cotton, MD [facility]  . IR GENERIC HISTORICAL  04/12/2016   IR RADIOLOGIST EVAL & MGMT 04/12/2016 [facility]  . RADIOLOGY WITH ANESTHESIA N/A 02/29/2016   Procedure: EMBOLIZATION     (RADIOLOGY WITH ANESTHESIA);  Surgeon: Julieanne Cotton, MD;  Location: [facility] OR;  Service: Radiology;  Laterality: N/A;  . RADIOLOGY WITH ANESTHESIA N/A 03/27/2016   Procedure: Embolization;  Surgeon: Julieanne Cotton, MD;  Location: [facility] OR;  Service: Radiology;  Laterality: N/A;  . VIDEO BRONCHOSCOPY Bilateral 12/06/2013   Procedure: VIDEO BRONCHOSCOPY WITHOUT FLUORO;  Surgeon: Alyson Reedy, MD;  Location: [facility] ENDOSCOPY;  Service: Cardiopulmonary;  Laterality: Bilateral;   Social History   Occupational History  . Not on file  Tobacco Use  . Smoking status: Current Every Day Smoker    Packs/day: 1.00    Years: 40.00    Pack years: 40.00    Types: Cigarettes  . Smokeless tobacco: Never Used  . Tobacco comment: smoking 1/2ppd as of 05/23/17  ep  Substance and Sexual Activity  . Alcohol use: Yes    Alcohol/week: 6.0 oz    Types: 10 Cans of beer per week    Comment: 12/04/2013 "2beers/day"  . Drug use: No    Comment: 01/19/2016 "nothing in the last 3 months"  . Sexual activity: Yes

## 2017-06-30 ENCOUNTER — Ambulatory Visit (INDEPENDENT_AMBULATORY_CARE_PROVIDER_SITE_OTHER): Payer: 59 | Admitting: Acute Care

## 2017-06-30 ENCOUNTER — Encounter: Payer: Self-pay | Admitting: Acute Care

## 2017-06-30 DIAGNOSIS — G4734 Idiopathic sleep related nonobstructive alveolar hypoventilation: Secondary | ICD-10-CM | POA: Diagnosis not present

## 2017-06-30 DIAGNOSIS — B370 Candidal stomatitis: Secondary | ICD-10-CM | POA: Diagnosis not present

## 2017-06-30 DIAGNOSIS — J441 Chronic obstructive pulmonary disease with (acute) exacerbation: Secondary | ICD-10-CM | POA: Diagnosis not present

## 2017-06-30 DIAGNOSIS — F17200 Nicotine dependence, unspecified, uncomplicated: Secondary | ICD-10-CM | POA: Diagnosis not present

## 2017-06-30 MED ORDER — NYSTATIN 100000 UNIT/ML MT SUSP: 5.0000 mL | Freq: Four times a day (QID) | OROMUCOSAL | 0 refills | Status: DC

## 2017-06-30 MED ORDER — PREDNISONE 10 MG PO TABS: ORAL_TABLET | ORAL | 0 refills | Status: DC

## 2017-06-30 MED ORDER — DOXYCYCLINE HYCLATE 100 MG PO TABS: 100.0000 mg | ORAL_TABLET | Freq: Two times a day (BID) | ORAL | 0 refills | Status: DC

## 2017-06-30 MED FILL — predniSONE 10 MG TABS: 10 | 12 days supply | Qty: 30 | Fill #0

## 2017-06-30 MED FILL — DOXYCYCLINE HYCLATE 100 MG: 100 | 7 days supply | Qty: 14 | Fill #0

## 2017-06-30 MED FILL — NYSTATIN 100,000 UNITS/ML S: 100000 | 3 days supply | Qty: 60 | Fill #0

## 2017-06-30 NOTE — Progress Notes (Signed)
History of Present Illness Derek Conner is a 56 y.o. male current every day smoker with GOLD III copd as of 10/25/16 PFT's , and stable  pulmonary nodules. He is followed by Dr. Marchelle Gearingamaswamy.  Maintenance regimen:spiriva/daliresp/advair 500    06/30/2017 Acute OV : Last Thursday, Friday, Saturday patient  awoke with worsening shortness of breath. He used his neulizers, and then went to the ED 11/18. He had 1 hour of continuous nebs in the ED and was discharged with , mucinex, Doxycycline and prednisone. CXR was clear.  He was also given cough syrup.He was diagnosed with COPD exacerbation. He states he was compliant with completing all the above medications. He is compliant with his Spiriva, Daliresp and Advair.He has states she feels somewhat better, but continues to have shortness of breath and productive cough. Not as bad as the shortness of breath he had prior to going to the ED. He is using his nebulizer every 3rd day as needed.No fever, chest pain, orthopnea or hemoptysis.He is continuing to smoke.Secretions are frothy white to yellow.We discussed the risk of osteo with frequent prednisone use and he was in agreement with another prednisone taper.He verbalized  Understanding of  the risk.  Test Results:  11/18/ 2018: CXR>> ED COPD. Biapical nodular densities, likely scarring. No active disease.  CBC Latest Ref Rng & Units 06/22/2017 06/12/2017 05/29/2017  WBC 4.0 - 10.5 K/uL 9.3 11.7(H) -  Hemoglobin 13.0 - 17.0 g/dL 16.113.5 09.613.6 04.515.3  Hematocrit 39.0 - 52.0 % 41.6 40.2 45.0  Platelets 150 - 400 K/uL 355 472(H) -    BMP Latest Ref Rng & Units 06/22/2017 05/29/2017 05/09/2017  Glucose 65 - 99 mg/dL 409(W127(H) 119(J138(H) -  BUN 6 - 20 mg/dL 13 16 -  Creatinine 4.780.61 - 1.24 mg/dL 2.951.04 6.211.10 3.08(M1.29(H)  Sodium 135 - 145 mmol/L 136 137 -  Potassium 3.5 - 5.1 mmol/L 3.3(L) 4.2 -  Chloride 101 - 111 mmol/L 104 103 -  CO2 22 - 32 mmol/L 23 - -  Calcium 8.9 - 10.3 mg/dL 9.3 - -    BNP      Component Value Date/Time   BNP 17.9 05/16/2016 1523     PFT    Component Value Date/Time   FEV1PRE 1.38 10/25/2016 1028   FEV1POST 1.10 02/15/2014 1202   FVCPRE 3.24 10/25/2016 1028   FVCPOST 3.10 02/15/2014 1202   TLC 9.36 02/15/2014 1202   DLCOUNC 24.75 10/25/2016 1028   PREFEV1FVCRT 43 10/25/2016 1028   PSTFEV1FVCRT 36 02/15/2014 1202    Dg Chest 2 View  Result Date: 06/22/2017 CLINICAL DATA:  Cough, congestion, shortness of Breath EXAM: CHEST  2 VIEW COMPARISON:  04/24/2017 FINDINGS: There is hyperinflation of the lungs compatible with COPD. Biapical nodular densities are stable. Heart is normal size. No effusions. No acute bony abnormality. IMPRESSION: COPD. Biapical nodular densities, likely scarring. No active disease. Electronically Signed   By: Charlett NoseKevin  Dover M.D.   On: 06/22/2017 15:48   Mr Pelvis W/o Contrast  Result Date: 06/22/2017 CLINICAL DATA:  Right hip pain for the past month. History of rheumatoid arthritis. Evaluate for avascular necrosis. EXAM: MRI PELVIS WITHOUT CONTRAST TECHNIQUE: Multiplanar multisequence MR imaging of the pelvis was performed. No intravenous contrast was administered. COMPARISON:  Right hip CT dated May 29, 2017. FINDINGS: Bones: There is serpiginous T2 hyperintense, T1 hypointense signal in both femoral heads, consistent with avascular necrosis. There is extensive marrow edema in the right femoral head extending into the intertrochanteric region. There is slight subchondral  collapse of the superior right femoral head. No fracture. Mild degenerative marrow edema in the iliac aspect of the right greater than left sacroiliac joints. The pubic symphysis is normal. Articular cartilage and labrum Articular cartilage: There is a full-thickness cartilage loss over the right posterior acetabulum and femoral head with underlying degenerative marrow edema in the acetabulum. Full-thickness cartilage loss over the superior acetabulum with underlying  subchondral cystic change. Labrum: Degeneration and irregularity of the right anterior superior labrum. Probable tear of the left superior labrum with adjacent small paralabral cyst. Joint or bursal effusion Joint effusion: Small right hip joint effusion. Bursae: No focal periarticular fluid collection. Muscles and tendons Muscles and tendons: Mild tendinosis and partial tearing of the right gluteus minimus tendon. The left gluteal tendons are unremarkable. The visualized bilateral hamstring and iliopsoas tendons appear normal. The piriformis muscles appear symmetric. Other findings Miscellaneous: The visualized internal pelvic contents appear unremarkable. IMPRESSION: 1. Bilateral femoral head avascular necrosis, worst on the right where there is slight subchondral collapse and extensive marrow edema in the femoral head and neck. 2. Mild to moderate right hip degenerative changes. 3. Small right hip joint effusion, likely reactive. Infection is thought to be less likely, but correlation for any signs or symptoms of septic arthritis is recommended. 4. Degenerative tearing of the right anterior superior and left superior labrum. 5. Right gluteus minimus tendinosis and partial tearing. Electronically Signed   By: Obie Dredge M.D.   On: 06/22/2017 16:10   Xr C-arm No Report  Result Date: 06/03/2017 Please see Notes or Procedures tab for imaging impression.    Past medical hx Past Medical History:  Diagnosis Date  . Asthma   . CAP (community acquired pneumonia) 01/19/2016  . Chronic bronchitis (HCC)   . COPD (chronic obstructive pulmonary disease) (HCC)   . Emphysema lung (HCC)   . Emphysema of lung (HCC)   . Hepatitis A infection ~ 1980  . High cholesterol   . Hypertension   . Pneumonia 1960s  . Rheumatoid arthritis (HCC)    "hands mostly"  ( 01/19/2016)  . Situational depression 12-18-09   "only when my mother died"     Social History   Tobacco Use  . Smoking status: Current Every Day  Smoker    Packs/day: 1.00    Years: 40.00    Pack years: 40.00    Types: Cigarettes  . Smokeless tobacco: Never Used  . Tobacco comment: smoking 1/2ppd as of 05/23/17  ep  Substance Use Topics  . Alcohol use: Yes    Alcohol/week: 6.0 oz    Types: 10 Cans of beer per week    Comment: 12/04/2013 "2beers/day"  . Drug use: No    Comment: 01/19/2016 "nothing in the last 3 months"    Mr.Habeck reports that he has been smoking cigarettes.  He has a 40.00 pack-year smoking history. he has never used smokeless tobacco. He reports that he drinks about 6.0 oz of alcohol per week. He reports that he does not use drugs.  Tobacco Cessation: Current every day smoker despite frequent counseling I have spent *3 minutes counseling patient on smoking cessation this visit.  Past surgical hx, Family hx, Social hx all reviewed.  Current Outpatient Medications on File Prior to Visit  Medication Sig  . albuterol (PROVENTIL HFA;VENTOLIN HFA) 108 (90 Base) MCG/ACT inhaler Inhale 2 puffs into the lungs every 4 (four) hours as needed for wheezing or shortness of breath.  Marland Kitchen albuterol (PROVENTIL) (2.5 MG/3ML) 0.083% nebulizer solution Take  3 mLs (2.5 mg total) by nebulization every 6 (six) hours as needed for wheezing or shortness of breath. DX: J44.9  . ALPRAZolam (XANAX) 0.5 MG tablet 1 tab po q day as needed severe anxiety/panic attack. (Patient taking differently: Take 0.5 mg by mouth daily as needed for anxiety. 1 tab po q day as needed severe anxiety/panic attack.)  . amLODipine (NORVASC) 10 MG tablet TAKE 1 TABLET (10 MG) BY MOUTH DAILY.  Marland Kitchen aspirin EC 81 MG tablet Take 81 mg by mouth daily.  Marland Kitchen b complex vitamins tablet Take 1 tablet by mouth daily.  . cloNIDine (CATAPRES) 0.2 MG tablet Take 1 tablet (0.2 mg total) by mouth 2 (two) times daily.  . cyclobenzaprine (FLEXERIL) 10 MG tablet Take 1 tablet (10 mg total) by mouth at bedtime.  Marland Kitchen DALIRESP 500 MCG TABS tablet TAKE 1 TABLET (500 MCG TOTAL) BY MOUTH  DAILY.  Marland Kitchen diclofenac (VOLTAREN) 50 MG EC tablet Take 1 tablet (50 mg total) by mouth 2 (two) times daily.  . famotidine (PEPCID) 20 MG tablet TAKE 1 TABLET (20 MG TOTAL) BY MOUTH AT BEDTIME.  . fenofibrate (TRICOR) 48 MG tablet TAKE 1 TABLET (48 MG TOTAL) BY MOUTH DAILY.  . fluticasone (FLONASE) 50 MCG/ACT nasal spray Place 2 sprays into both nostrils daily.  . Fluticasone-Salmeterol (ADVAIR DISKUS) 500-50 MCG/DOSE AEPB Inhale 1 puff into the lungs 2 (two) times daily.  . Guaifenesin 1200 MG TB12 Take 1 tablet (1,200 mg total) 2 (two) times daily by mouth.  Marland Kitchen HYDROcodone-acetaminophen (NORCO) 5-325 MG tablet Take 1 tablet every 6 (six) hours as needed by mouth for moderate pain or severe pain.  Marland Kitchen ibuprofen (ADVIL,MOTRIN) 200 MG tablet Take 400 mg by mouth every 6 (six) hours as needed for moderate pain.  Marland Kitchen ketorolac (TORADOL) 10 MG tablet Take 1 tablet (10 mg total) by mouth every 8 (eight) hours as needed.  . methocarbamol (ROBAXIN) 500 MG tablet Take 1 tablet (500 mg total) by mouth every 8 (eight) hours as needed for muscle spasms.  Marland Kitchen oxyCODONE-acetaminophen (PERCOCET/ROXICET) 5-325 MG tablet Take 1 tablet by mouth every 4 (four) hours as needed for severe pain.  . pantoprazole (PROTONIX) 40 MG tablet Take 1 tablet (40 mg total) by mouth daily at 12 noon.  . promethazine-dextromethorphan (PROMETHAZINE-DM) 6.25-15 MG/5ML syrup Take 5 mLs 4 (four) times daily as needed by mouth for cough.  . ticagrelor (BRILINTA) 90 MG TABS tablet Take 45-90 mg by mouth 2 (two) times daily. 1 in the morning and 1/2 in the evening  . tiotropium (SPIRIVA HANDIHALER) 18 MCG inhalation capsule PLACE 1 CAPSULE INTO INHALER AND INHALE INTO LUNGS DAILY.   Current Facility-Administered Medications on File Prior to Visit  Medication  . 0.9 %  sodium chloride infusion  . aspirin EC tablet 325 mg  . niMODipine (NIMOTOP) capsule 0-60 mg     Allergies  Allergen Reactions  . Symbicort [Budesonide-Formoterol Fumarate]  Shortness Of Breath, Nausea And Vomiting and Other (See Comments)    heachache  . Dulera [Mometasone Furo-Formoterol Fum] Other (See Comments)    Headache   . Humira [Adalimumab]     Review Of Systems:  Constitutional:   No  weight loss, night sweats,  Fevers, chills, +fatigue, or  lassitude.  HEENT:   No headaches,  Difficulty swallowing,  Tooth/dental problems, or  Sore throat,                No sneezing, itching, ear ache, nasal congestion, post nasal drip,  CV:  No chest pain,  Orthopnea, PND, swelling in lower extremities, anasarca, dizziness, palpitations, syncope.   GI  No heartburn, indigestion, abdominal pain, nausea, vomiting, diarrhea, change in bowel habits, loss of appetite, bloody stools.   Resp: + shortness of breath with exertion less at rest.  + excess mucus, + productive cough,  + non-productive cough,  No coughing up of blood.  + change in color of mucus.  + wheezing.  No chest wall deformity  Skin: no rash or lesions.  GU: no dysuria, change in color of urine, no urgency or frequency.  No flank pain, no hematuria   MS:  No joint pain or swelling.  No decreased range of motion.  No back pain.  Psych:  No change in mood or affect. No depression or anxiety.  No memory loss.   Vital Signs BP 130/70 (BP Location: Left Arm, Cuff Size: Normal)   Pulse 84   Ht 6' (1.829 m)   Wt 177 lb (80.3 kg)   SpO2 96%   BMI 24.01 kg/m    Physical Exam:  General- No distress,  A&Ox3, pleasant ENT: No sinus tenderness, TM clear, pale nasal mucosa, no oral exudate,no post nasal drip, no LAN Cardiac: S1, S2, regular rate and rhythm, no murmur Chest: + wheeze/ no rales/ dullness; + rhonchi ,no accessory muscle use, no nasal flaring, no sternal retractions Abd.: Soft Non-tender, non-distended Ext: No clubbing cyanosis, edema Neuro:  normal strength, alert and oriented x 3 Skin: No rashes, warm and dry Psych: normal mood and behavior   Assessment/Plan  COPD  exacerbation (HCC) Slow to resolve exacerbation Plan: Continue your Spiriva, Daliresp and Advair as you have been doing. Rinse mouth after use. Doxycycline 100 mg BID x 7 days Probiotic with antibiotic Prednisone taper; 10 mg tablets: 4 tabs x 3 days, 3 tabs x 3 days, 2 tabs x 3 days 1 tab x 3 days then stop. Continue wearing oxygen at night at 2 L [place]. Neb treatments 2 times a day until over flare. Please try to quit smoking. Note your daily symptoms > remember "red flags" for COPD:  Increase in cough, increase in sputum production, increase in shortness of breath or activity tolerance. If you notice these symptoms, please call to be seen.   Follow up in 2 weeks with Dr. Marchelle Gearing or Kandice Robinsons, NP. Please contact office for sooner follow up if symptoms do not improve or worsen or seek emergency care    Nocturnal hypoxemia Continue wearing oxygen at 2 L [place] every night without fail.  Tobacco use disorder Continues to smoke despite counseling Plan: Quit smoking I have spent 3 minutes counseling patient on smoking cessation this visit.  Thrush Oral Thrush with prednisone use Plan: Nystatin mouthwash 5 cc's 3-4 times daily x 10 days Rinse and spit        Bevelyn Ngo, NP 06/30/2017  11:15 AM

## 2017-06-30 NOTE — Assessment & Plan Note (Signed)
Oral Thrush with prednisone use Plan: Nystatin mouthwash 5 cc's 3-4 times daily x 10 days Rinse and spit

## 2017-06-30 NOTE — Assessment & Plan Note (Signed)
Continues to smoke despite counseling Plan: Quit smoking I have spent 3 minutes counseling patient on smoking cessation this visit.

## 2017-06-30 NOTE — Assessment & Plan Note (Signed)
Slow to resolve exacerbation Plan: Continue your Spiriva, Daliresp and Advair as you have been doing. Rinse mouth after use. Doxycycline 100 mg BID x 7 days Probiotic with antibiotic Prednisone taper; 10 mg tablets: 4 tabs x 3 days, 3 tabs x 3 days, 2 tabs x 3 days 1 tab x 3 days then stop. Continue wearing oxygen at night at 2 L [place]. Neb treatments 2 times a day until over flare. Please try to quit smoking. Note your daily symptoms > remember "red flags" for COPD:  Increase in cough, increase in sputum production, increase in shortness of breath or activity tolerance. If you notice these symptoms, please call to be seen.   Follow up in 2 weeks with Dr. Marchelle Gearing or Kandice Robinsons, NP. Please contact office for sooner follow up if symptoms do not improve or worsen or seek emergency care

## 2017-06-30 NOTE — Assessment & Plan Note (Signed)
Continue wearing oxygen at 2 L [place] every night without fail.

## 2017-06-30 NOTE — Patient Instructions (Addendum)
It is good to see you today. I am sorry you are not feeling well. Continue your Spiriva, Daliresp and Advair as you have been doing. Rinse mouth after use. Doxycycline 100 mg BID x 7 days Probiotic with antibiotic Prednisone taper; 10 mg tablets: 4 tabs x 3 days, 3 tabs x 3 days, 2 tabs x 3 days 1 tab x 3 days then stop. Continue wearing oxygen at night at 2 L [place]. Nystatin  mouthwash 5 cc's 3-4 times daily for thrush x 10 days.. Rinse and spit. Neb treatments 2 times a day until over flare. Please try to quit smoking. Note your daily symptoms > remember "red flags" for COPD:  Increase in cough, increase in sputum production, increase in shortness of breath or activity tolerance. If you notice these symptoms, please call to be seen.   Follow up in 2 weeks with Dr. Marchelle Gearing or Kandice Robinsons, NP. Please contact office for sooner follow up if symptoms do not improve or worsen or seek emergency care

## 2017-07-01 ENCOUNTER — Telehealth: Payer: Self-pay | Admitting: Internal Medicine

## 2017-07-01 NOTE — Telephone Encounter (Signed)
I gave the paperwork to MR for him to fill out. Will leave this encounter open until I have received the paperwork back.

## 2017-07-03 NOTE — Telephone Encounter (Signed)
As of yesterday, the paperwork was still in MR's office.  MR, please advise if you have the disability paperwork signed.  Thanks!

## 2017-07-03 NOTE — Telephone Encounter (Signed)
Any updates Irving Burton?

## 2017-07-04 ENCOUNTER — Telehealth: Payer: Self-pay | Admitting: Internal Medicine

## 2017-07-04 ENCOUNTER — Other Ambulatory Visit: Payer: Self-pay | Admitting: Acute Care

## 2017-07-04 MED ORDER — FLUTICASONE-SALMETEROL 500-50 MCG/DOSE IN AEPB: 1.0000 | INHALATION_SPRAY | Freq: Two times a day (BID) | RESPIRATORY_TRACT | 3 refills | Status: DC

## 2017-07-04 MED FILL — DALIRESP 500 MCG TABLET: 500 | 30 days supply | Qty: 30 | Fill #1

## 2017-07-04 MED FILL — VENTOLIN HFA 90 MCG INHALER: 108 (90 BAS | 25 days supply | Qty: 18 | Fill #1

## 2017-07-04 MED FILL — ADVAIR 500/50 DISKUS: 500-50 | 30 days supply | Qty: 60 | Fill #0

## 2017-07-04 MED FILL — BRILINTA 90 MG TABLET: 90 | 30 days supply | Qty: 60 | Fill #1

## 2017-07-04 NOTE — Telephone Encounter (Signed)
Rx sent to the pharmacy. Pt notified.

## 2017-07-07 DIAGNOSIS — J449 Chronic obstructive pulmonary disease, unspecified: Secondary | ICD-10-CM | POA: Diagnosis not present

## 2017-07-07 NOTE — Telephone Encounter (Signed)
Routing to MR and Irving Burton for follow up on pt's FMLA paperwork Please advise, thank you.

## 2017-07-08 NOTE — Telephone Encounter (Signed)
Handed FMLA paperwork to [facility] for her to handle from that point. Nothing further needed.

## 2017-07-08 NOTE — Telephone Encounter (Signed)
Done and given to [facility] 07/08/2017   Dr. Kalman Shan, M.D., [facility].C.P Pulmonary and Critical Care Medicine Staff Physician, [facility] Director - Interstitial Lung Disease  Program  Pulmonary Fibrosis [facility] [place], [place], [address]  Pager: [phone], If no answer or between  15:00h - 7:00h: call [phone] Telephone: [phone]

## 2017-07-08 NOTE — Telephone Encounter (Signed)
Rec'd completed paperwork from [employer] - fwd to [employer] via [employer] -pr

## 2017-07-10 MED FILL — CloNIDine HCL 0.2 MG TAB: 0.2 | 90 days supply | Qty: 180 | Fill #0

## 2017-07-14 ENCOUNTER — Ambulatory Visit: Payer: 59 | Admitting: Acute Care

## 2017-07-17 ENCOUNTER — Ambulatory Visit (INDEPENDENT_AMBULATORY_CARE_PROVIDER_SITE_OTHER): Payer: 59 | Admitting: Acute Care

## 2017-07-17 ENCOUNTER — Telehealth: Payer: Self-pay | Admitting: Acute Care

## 2017-07-17 ENCOUNTER — Encounter: Payer: Self-pay | Admitting: Acute Care

## 2017-07-17 DIAGNOSIS — J441 Chronic obstructive pulmonary disease with (acute) exacerbation: Secondary | ICD-10-CM | POA: Diagnosis not present

## 2017-07-17 MED ORDER — NYSTATIN 100000 UNIT/ML MT SUSP: 5.0000 mL | Freq: Three times a day (TID) | OROMUCOSAL | 0 refills | Status: AC

## 2017-07-17 MED FILL — NYSTATIN 100,000 UNITS/ML S: 100000 | 10 days supply | Qty: 200 | Fill #0

## 2017-07-17 NOTE — Progress Notes (Signed)
History of Present Illness Derek Conner is a 56 y.o. male with current every day smoker with GOLD III copd as of 10/25/16 PFT's, and stable  pulmonary nodules. He is followed by Dr. Marchelle Gearing.  Maintenance regimen:spiriva/daliresp/advair 500   07/17/2017 Follow up after COPD exacerbation:Pt was seen 11/26 with a slow to resolve COPD exacerbation. He is continuing to smoke despite frequent exacerbations. Plan of care  after that visit was as follows:  COPD exacerbation (HCC) Slow to resolve exacerbation Plan: Continue your Spiriva, Daliresp and Advair as you have been doing. Rinse mouth after use. Doxycycline 100 mg BID x 7 days Probiotic with antibiotic Prednisone taper; 10 mg tablets: 4 tabs x 3 days, 3 tabs x 3 days, 2 tabs x 3 days 1 tab x 3 days then stop. Continue wearing oxygen at night at 2 L [place]. Neb treatments 2 times a day until over flare. Please try to quit smoking. Note your daily symptoms >remember "red flags" for COPD: Increase in cough, increase in sputum production, increase in shortness of breath or activity tolerance. If you notice these symptoms, please call to be seen.  Follow up in 2 weeks with Dr. Marchelle Gearing or Kandice Robinsons, NP. Please contact office for sooner follow up if symptoms do not improve or worsen or seek emergency care    Nocturnal hypoxemia Continue wearing oxygen at 2 L [place] every night without fail.  Tobacco use disorder Continues to smoke despite counseling Plan: Quit smoking I have spent 3 minutes counseling patient on smoking cessation this visit.  Thrush Oral Thrush with prednisone use Plan: Nystatin mouthwash 5 cc's 3-4 times daily x 10 days Rinse and spit  Patient returns today for follow up. He states he was complaint with the antibiotic and the prednisone taper.He states he was compliant with his Nystatim mouthwash.He states he has been compliant with his Spiriva, Daliresp and Advair 500. He states he is better. No  longer coughing up discolored secretions. He states he still has a cough, but he is still smoking. He stated secretions are watery to foamy. White in color. He continues to have oral thrush. He states he is rinsing his mouth after every dose of inhalers. He denies fever, chest pain, orthopnea or hemoptysis. He is planning on hip replacement surgery in January 2019.   Test Results: CXR 06/22/17>> IMPRESSION: COPD. Biapical nodular densities, likely scarring. No active disease.  CBC Latest Ref Rng & Units 06/22/2017 06/12/2017 05/29/2017  WBC 4.0 - 10.5 K/uL 9.3 11.7(H) -  Hemoglobin 13.0 - 17.0 g/dL 35.3 61.4 43.1  Hematocrit 39.0 - 52.0 % 41.6 40.2 45.0  Platelets 150 - 400 K/uL 355 472(H) -    BMP Latest Ref Rng & Units 06/22/2017 05/29/2017 05/09/2017  Glucose 65 - 99 mg/dL 540(G) 867(Y) -  BUN 6 - 20 mg/dL 13 16 -  Creatinine 1.95 - 1.24 mg/dL 0.93 2.67 1.24(P)  Sodium 135 - 145 mmol/L 136 137 -  Potassium 3.5 - 5.1 mmol/L 3.3(L) 4.2 -  Chloride 101 - 111 mmol/L 104 103 -  CO2 22 - 32 mmol/L 23 - -  Calcium 8.9 - 10.3 mg/dL 9.3 - -    BNP    Component Value Date/Time   BNP 17.9 05/16/2016 1523     PFT    Component Value Date/Time   FEV1PRE 1.38 10/25/2016 1028   FEV1POST 1.10 02/15/2014 1202   FVCPRE 3.24 10/25/2016 1028   FVCPOST 3.10 02/15/2014 1202   TLC 9.36 02/15/2014 1202  DLCOUNC 24.75 10/25/2016 1028   PREFEV1FVCRT 43 10/25/2016 1028   PSTFEV1FVCRT 36 02/15/2014 1202    Dg Chest 2 View  Result Date: 06/22/2017 CLINICAL DATA:  Cough, congestion, shortness of Breath EXAM: CHEST  2 VIEW COMPARISON:  04/24/2017 FINDINGS: There is hyperinflation of the lungs compatible with COPD. Biapical nodular densities are stable. Heart is normal size. No effusions. No acute bony abnormality. IMPRESSION: COPD. Biapical nodular densities, likely scarring. No active disease. Electronically Signed   By: Charlett Nose M.D.   On: 06/22/2017 15:48   Mr Pelvis W/o  Contrast  Result Date: 06/22/2017 CLINICAL DATA:  Right hip pain for the past month. History of rheumatoid arthritis. Evaluate for avascular necrosis. EXAM: MRI PELVIS WITHOUT CONTRAST TECHNIQUE: Multiplanar multisequence MR imaging of the pelvis was performed. No intravenous contrast was administered. COMPARISON:  Right hip CT dated May 29, 2017. FINDINGS: Bones: There is serpiginous T2 hyperintense, T1 hypointense signal in both femoral heads, consistent with avascular necrosis. There is extensive marrow edema in the right femoral head extending into the intertrochanteric region. There is slight subchondral collapse of the superior right femoral head. No fracture. Mild degenerative marrow edema in the iliac aspect of the right greater than left sacroiliac joints. The pubic symphysis is normal. Articular cartilage and labrum Articular cartilage: There is a full-thickness cartilage loss over the right posterior acetabulum and femoral head with underlying degenerative marrow edema in the acetabulum. Full-thickness cartilage loss over the superior acetabulum with underlying subchondral cystic change. Labrum: Degeneration and irregularity of the right anterior superior labrum. Probable tear of the left superior labrum with adjacent small paralabral cyst. Joint or bursal effusion Joint effusion: Small right hip joint effusion. Bursae: No focal periarticular fluid collection. Muscles and tendons Muscles and tendons: Mild tendinosis and partial tearing of the right gluteus minimus tendon. The left gluteal tendons are unremarkable. The visualized bilateral hamstring and iliopsoas tendons appear normal. The piriformis muscles appear symmetric. Other findings Miscellaneous: The visualized internal pelvic contents appear unremarkable. IMPRESSION: 1. Bilateral femoral head avascular necrosis, worst on the right where there is slight subchondral collapse and extensive marrow edema in the femoral head and neck. 2. Mild to  moderate right hip degenerative changes. 3. Small right hip joint effusion, likely reactive. Infection is thought to be less likely, but correlation for any signs or symptoms of septic arthritis is recommended. 4. Degenerative tearing of the right anterior superior and left superior labrum. 5. Right gluteus minimus tendinosis and partial tearing. Electronically Signed   By: Obie Dredge M.D.   On: 06/22/2017 16:10     Past medical hx Past Medical History:  Diagnosis Date  . Asthma   . CAP (community acquired pneumonia) 01/19/2016  . Chronic bronchitis (HCC)   . COPD (chronic obstructive pulmonary disease) (HCC)   . Emphysema lung (HCC)   . Emphysema of lung (HCC)   . Hepatitis A infection ~ 1980  . High cholesterol   . Hypertension   . Pneumonia 1960s  . Rheumatoid arthritis (HCC)    "hands mostly"  ( 01/19/2016)  . Situational depression 11-22-09   "only when my mother died"     Social History   Tobacco Use  . Smoking status: Current Every Day Smoker    Packs/day: 0.50    Years: 40.00    Pack years: 20.00    Types: Cigarettes  . Smokeless tobacco: Never Used  . Tobacco comment: smoking 1/2ppd as of 05/23/17  ep  Substance Use Topics  . Alcohol  use: Yes    Alcohol/week: 6.0 oz    Types: 10 Cans of beer per week    Comment: 12/04/2013 "2beers/day"  . Drug use: No    Comment: 01/19/2016 "nothing in the last 3 months"    Mr.Oyervides reports that he has been smoking cigarettes.  He has a 20.00 pack-year smoking history. he has never used smokeless tobacco. He reports that he drinks about 6.0 oz of alcohol per week. He reports that he does not use drugs.  Tobacco Cessation: I have spent 4 minutes counseling patient on smoking cessation this visit. Patient is continuing to smoke despite repeated counseling and advise. He understands that his continued tobacco abuse is putting his health at risk and is contributing to his frequent exacerbations.  Past surgical hx, Family hx,  Social hx all reviewed.  Current Outpatient Medications on File Prior to Visit  Medication Sig  . albuterol (PROVENTIL HFA;VENTOLIN HFA) 108 (90 Base) MCG/ACT inhaler Inhale 2 puffs into the lungs every 4 (four) hours as needed for wheezing or shortness of breath.  Marland Kitchen albuterol (PROVENTIL) (2.5 MG/3ML) 0.083% nebulizer solution Take 3 mLs (2.5 mg total) by nebulization every 6 (six) hours as needed for wheezing or shortness of breath. DX: J44.9  . ALPRAZolam (XANAX) 0.5 MG tablet 1 tab po q day as needed severe anxiety/panic attack. (Patient taking differently: Take 0.5 mg by mouth daily as needed for anxiety. 1 tab po q day as needed severe anxiety/panic attack.)  . amLODipine (NORVASC) 10 MG tablet TAKE 1 TABLET (10 MG) BY MOUTH DAILY.  Marland Kitchen aspirin EC 81 MG tablet Take 81 mg by mouth daily.  Marland Kitchen b complex vitamins tablet Take 1 tablet by mouth daily.  . cloNIDine (CATAPRES) 0.2 MG tablet Take 1 tablet (0.2 mg total) by mouth 2 (two) times daily.  . cyclobenzaprine (FLEXERIL) 10 MG tablet Take 1 tablet (10 mg total) by mouth at bedtime.  Marland Kitchen DALIRESP 500 MCG TABS tablet TAKE 1 TABLET (500 MCG TOTAL) BY MOUTH DAILY.  . famotidine (PEPCID) 20 MG tablet TAKE 1 TABLET (20 MG TOTAL) BY MOUTH AT BEDTIME.  . fenofibrate (TRICOR) 48 MG tablet TAKE 1 TABLET (48 MG TOTAL) BY MOUTH DAILY.  Marland Kitchen Fluticasone-Salmeterol (ADVAIR DISKUS) 500-50 MCG/DOSE AEPB Inhale 1 puff into the lungs 2 (two) times daily.  Marland Kitchen HYDROcodone-acetaminophen (NORCO) 5-325 MG tablet Take 1 tablet every 6 (six) hours as needed by mouth for moderate pain or severe pain.  . pantoprazole (PROTONIX) 40 MG tablet Take 1 tablet (40 mg total) by mouth daily at 12 noon.  . ticagrelor (BRILINTA) 90 MG TABS tablet Take 45-90 mg by mouth 2 (two) times daily. 1 in the morning and 1/2 in the evening  . tiotropium (SPIRIVA HANDIHALER) 18 MCG inhalation capsule PLACE 1 CAPSULE INTO INHALER AND INHALE INTO LUNGS DAILY.   Current Facility-Administered  Medications on File Prior to Visit  Medication  . 0.9 %  sodium chloride infusion  . aspirin EC tablet 325 mg  . niMODipine (NIMOTOP) capsule 0-60 mg     Allergies  Allergen Reactions  . Symbicort [Budesonide-Formoterol Fumarate] Shortness Of Breath, Nausea And Vomiting and Other (See Comments)    heachache  . Dulera [Mometasone Furo-Formoterol Fum] Other (See Comments)    Headache   . Humira [Adalimumab]     Review Of Systems:  Constitutional:   No  weight loss, night sweats,  Fevers, chills, fatigue, or  lassitude.  HEENT:   No headaches,  Difficulty swallowing,  Tooth/dental problems,  or  Sore throat,                No sneezing, itching, ear ache, nasal congestion, post nasal drip,   CV:  No chest pain,  Orthopnea, PND, swelling in lower extremities, anasarca, dizziness, palpitations, syncope.   GI  No heartburn, indigestion, abdominal pain, nausea, vomiting, diarrhea, change in bowel habits, loss of appetite, bloody stools.   Resp: + baseline  shortness of breath with exertion not  at rest.  No excess mucus, + baseline  productive cough,  No non-productive cough,  No coughing up of blood.  No change in color of mucus.  + occasional  wheezing.  No chest wall deformity  Skin: no rash or lesions.  GU: no dysuria, change in color of urine, no urgency or frequency.  No flank pain, no hematuria   MS:  No joint pain or swelling.  No decreased range of motion.  No back pain.  Psych:  No change in mood or affect. No depression or anxiety.  No memory loss.   Vital Signs BP 118/70 (BP Location: Right Arm, Cuff Size: Normal)   Pulse 94   Ht 6' (1.829 m)   Wt 183 lb 12.8 oz (83.4 kg)   SpO2 98%   BMI 24.93 kg/m    Physical Exam:  General- No distress,  A&Ox3, pleasant ENT: No sinus tenderness, TM clear, pale nasal mucosa, no oral exudate,no post nasal drip, no LAN Cardiac: S1, S2, regular rate and rhythm, no murmur Chest: No wheeze/ rales/+ rhonchi, ; no accessory muscle  use, no nasal flaring, no sternal retractions, diminished per bases Abd.: Soft Non-tender, non-distended, BS + Ext: No clubbing cyanosis, edema Neuro:  normal strength, walking with crutched due to hip injury Skin: No rashes, warm and dry Psych: normal mood and behavior   Assessment/Plan  COPD Exacerbation Resolved after recent treatment Plan: Continue spiriva/daliresp/advair 500 Rinse after use Please try to quit smoking. Note your daily symptoms >remember "red flags" for COPD: Increase in cough, increase in sputum production, increase in shortness of breath or activity tolerance. If you notice these symptoms, please call to be seen.  Continue wearing oxygen at night at 2 L Get Ayr nasal gel We will place an order to humidify oxygen with your DME. Follow up with Dr. Marchelle Gearing or Maralyn Sago NP for operative clearance for hip surgery near the end of January. Please contact office for sooner follow up if symptoms do not improve or worsen or seek emergency care   Thrush Slow to resolve Plan: We will repeat Nystatin mouth wash  Tobacco Abuse Continues to smoke despite knowledge it is causing exacerbations oc COPD Plan: I have spent 4 minutes counseling patient on smoking cessation this visit.  Nocturnal Hypoxemia Continue wearing oxygen at 2 L at bedtime Use Ayr saline gel for nasal dryness We will have humidification added to your oxygen tank   Bevelyn Ngo, NP 07/17/2017  11:36 AM

## 2017-07-17 NOTE — Telephone Encounter (Signed)
Thrush Oral Thrush with prednisone use Plan: Nystatin mouthwash 5 cc's 3-4 times daily x 10 days Rinse and spit ----------- Amount of Nystatin prescribed was 68mL- this is only enough for 3 days, when pt is to use this for 10 days per SG's instructions.  Spoke with Phoebe Sharps at pharmacy and changed dispense quantity to #235mL to ensure that pt has amount of med needed for full prescription.  Nothing further needed.

## 2017-07-17 NOTE — Patient Instructions (Addendum)
It is good to see you today. We will send in a prescription for another round of Nystatin Mouth Rinse. Rinse and spit 3 times daily x 10 days. Continue spiriva/daliresp/advair 500 Rinse after use Please try to quit smoking. Note your daily symptoms >remember "red flags" for COPD: Increase in cough, increase in sputum production, increase in shortness of breath or activity tolerance. If you notice these symptoms, please call to be seen.  Continue wearing oxygen at night at 2 L Get Ayr nasal gel We will place an order to humidify oxygen with your DME. Follow up with Dr. Marchelle Gearing or Maralyn Sago NP for operative clearance for hip surgery near the end of January. Please contact office for sooner follow up if symptoms do not improve or worsen or seek emergency care  Have a Happy Holiday

## 2017-07-21 ENCOUNTER — Telehealth (INDEPENDENT_AMBULATORY_CARE_PROVIDER_SITE_OTHER): Payer: Self-pay

## 2017-07-21 MED ORDER — CYCLOBENZAPRINE HCL 10 MG PO TABS: 10.0000 mg | ORAL_TABLET | Freq: Two times a day (BID) | ORAL | 0 refills | Status: DC | PRN

## 2017-07-21 MED FILL — CYCLOBENZAPRINE 10 MG TAB: 10 | 15 days supply | Qty: 30 | Fill #0

## 2017-07-21 MED FILL — FENOFIBRATE 48 MG TABLET: 48 | 30 days supply | Qty: 30 | Fill #3

## 2017-07-21 NOTE — Telephone Encounter (Signed)
Y 1 po q 12 # 30 pls cla lthx

## 2017-07-21 NOTE — Telephone Encounter (Signed)
[facility] request refill on flexeril 10mg  . Ok to refill? Please advise on quantity and sig. Thanks.

## 2017-07-21 NOTE — Addendum Note (Signed)
Addended byPrescott Parma on: 07/21/2017 03:03 PM   Modules accepted: Orders

## 2017-07-21 NOTE — Telephone Encounter (Signed)
Submitted to pharmacy 

## 2017-07-23 MED FILL — SPIRIVA 18 MCG CP-HANDIHALE: 18 | 90 days supply | Qty: 90 | Fill #1

## 2017-07-30 ENCOUNTER — Other Ambulatory Visit: Payer: Self-pay | Admitting: Medical

## 2017-07-30 MED FILL — ALBUTEROL 0.083% INHAL SOLN: (2.5 MG/3ML | 6 days supply | Qty: 75 | Fill #2

## 2017-07-30 MED FILL — ADVAIR 500/50 DISKUS: 500-50 | 30 days supply | Qty: 60 | Fill #1

## 2017-07-30 MED FILL — BRILINTA 90 MG TABLET: 90 | 30 days supply | Qty: 60 | Fill #2

## 2017-07-30 MED FILL — DALIRESP 500 MCG TABLET: 500 | 30 days supply | Qty: 30 | Fill #2

## 2017-07-30 MED FILL — VENTOLIN HFA 90 MCG INHALER: 108 (90 BAS | 75 days supply | Qty: 54 | Fill #2

## 2017-07-31 ENCOUNTER — Telehealth (HOSPITAL_COMMUNITY): Payer: Self-pay | Admitting: *Deleted

## 2017-07-31 NOTE — Telephone Encounter (Signed)
Per Dr. Corliss Skains called an refilled Brilinita 90mg  tabs, qty 60 for 2 refills

## 2017-08-06 ENCOUNTER — Telehealth (INDEPENDENT_AMBULATORY_CARE_PROVIDER_SITE_OTHER): Payer: Self-pay

## 2017-08-06 ENCOUNTER — Ambulatory Visit (INDEPENDENT_AMBULATORY_CARE_PROVIDER_SITE_OTHER): Payer: 59 | Admitting: Surgery

## 2017-08-06 ENCOUNTER — Encounter (INDEPENDENT_AMBULATORY_CARE_PROVIDER_SITE_OTHER): Payer: Self-pay

## 2017-08-06 NOTE — Telephone Encounter (Signed)
Patient came by office today and stated that he is ready to proceed with hip surgery. His pain is getting worse and it is getting more difficult for daily activity.  He does see his pulmonologist later in the month but could move appt of if you thought was necessary. He wants to get it done ASAP but I made sure that he was aware that process of getting clearance could take some time. Can you please complete blue sheet so I can give to [facility] for scheduling.

## 2017-08-07 DIAGNOSIS — J449 Chronic obstructive pulmonary disease, unspecified: Secondary | ICD-10-CM | POA: Diagnosis not present

## 2017-08-11 NOTE — Telephone Encounter (Signed)
Done pls schedule thx

## 2017-08-20 ENCOUNTER — Ambulatory Visit (INDEPENDENT_AMBULATORY_CARE_PROVIDER_SITE_OTHER): Payer: 59 | Admitting: Orthopedic Surgery

## 2017-08-20 ENCOUNTER — Encounter (INDEPENDENT_AMBULATORY_CARE_PROVIDER_SITE_OTHER): Payer: Self-pay | Admitting: Orthopedic Surgery

## 2017-08-20 DIAGNOSIS — M87052 Idiopathic aseptic necrosis of left femur: Secondary | ICD-10-CM | POA: Diagnosis not present

## 2017-08-20 DIAGNOSIS — M87051 Idiopathic aseptic necrosis of right femur: Secondary | ICD-10-CM

## 2017-08-20 MED FILL — FENOFIBRATE 48 MG TABLET: 48 | 90 days supply | Qty: 90 | Fill #0

## 2017-08-20 MED FILL — ALBUTEROL 0.083% INHAL SOLN: (2.5 MG/3ML | 6 days supply | Qty: 75 | Fill #3

## 2017-08-20 NOTE — Telephone Encounter (Signed)
Spoke with patient while in office today.

## 2017-08-21 ENCOUNTER — Encounter (INDEPENDENT_AMBULATORY_CARE_PROVIDER_SITE_OTHER): Payer: Self-pay | Admitting: Orthopedic Surgery

## 2017-08-21 NOTE — Progress Notes (Signed)
Office Visit Note   Patient: Derek Conner           Date of Birth: 1961/04/05           MRN: [id number] Visit Date: 08/20/2017 Requested by: Esperanza Richters, PA-C [address] [place], [place] [address] PCP: Marisue Brooklyn  Subjective: Chief Complaint  Patient presents with  . Hip Pain    HPI: Derek Conner is a 57 year old patient with right hip pain.  He has known avascular necrosis in both hips.  He reports debilitating pain in the right hip along with popping and mechanical symptoms.  He has been on crutches.  He has been trying to work.  He had an injection which helped but it was only temporary relief.  He starting to have some mild mechanical symptoms but no pain in the left hip.  He is on Eliquis because 2 years ago he had aneurysm surgery and had a that time.  He does not have any teeth.  No family history of DVT or pulmonary embolism.              ROS: All systems reviewed are negative as they relate to the chief complaint within the history of present illness.  Patient denies  fevers or chills.   Assessment & Plan: Visit Diagnoses:  1. Avascular necrosis of bones of both hips (HCC)     Plan: Impression is bilateral hip pain right worse than left with avascular necrosis.  Left hip is relatively asymptomatic except for some mild mechanical symptoms.  Plan is right total hip replacement.  Risk and benefits discussed including but not limited to infection nerve vessel damage incomplete pain relief as well as potential for revision.  Dislocation risk also discussed.  Currently the patient's leg lengths are approximately equal.  All questions answered.  Plan for surgery in the near future.  Time out of work discussed.  Follow-Up Instructions: No Follow-up on file.   Orders:  No orders of the defined types were placed in this encounter.  No orders of the defined types were placed in this encounter.     Procedures: No procedures performed   Clinical  Data: No additional findings.  Objective: Vital Signs: There were no vitals taken for this visit.  Physical Exam:   Constitutional: Patient appears well-developed HEENT:  Head: Normocephalic Eyes:EOM are normal Neck: Normal range of motion Cardiovascular: Normal rate Pulmonary/chest: Effort normal Neurologic: Patient is alert Skin: Skin is warm Psychiatric: Patient has normal mood and affect    Ortho Exam: Orthopedic exam demonstrates antalgic gait to the right.  He does have pain with internal/external rotation of that right hip.  Not much in the way of restricted range of motion yet but it is painful.  Hip flexion abduction strength intact.  Ankle dorsiflexion plantar flexion intact.  No other masses lymphadenopathy or skin changes noted in the right hip region.  Specialty Comments:  No specialty comments available.  Imaging: No results found.   PMFS History: Patient Active Problem List   Diagnosis Date Noted  . Nocturnal hypoxemia 04/03/2017  . Fatigue 01/09/2017  . Thrush 12/12/2016  . Brain aneurysm 03/27/2016  . Preop respiratory exam 03/26/2016  . Aneurysm (HCC)   . Chronic obstructive pulmonary disease with acute exacerbation (HCC) 01/19/2016  . Community acquired pneumonia 01/19/2016  . Cough syncope 01/19/2016  . Wellness examination 08/29/2015  . Hyperglycemia 01/06/2015  . HTN (hypertension) 12/09/2014  . COPD exacerbation (HCC) 07/08/2014  . Atypical  chest pain 07/08/2014  . 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 Dry mouth 12/26/2013  . Cigarette smoker 12/26/2013  . Nodule of left lung 12/26/2013  . COPD, severity to be determined (HCC) 12/26/2013  . Pulmonary nodule, right 12/06/2013  . Hemoptysis 12/03/2013  . COPD (chronic obstructive pulmonary disease) (HCC) 12/03/2013  . Acute bronchitis 12/03/2013  . Tobacco use disorder 12/03/2013   Past Medical History:  Diagnosis Date  . Asthma   . CAP (community acquired pneumonia) 01/19/2016  . Chronic  bronchitis (HCC)   . COPD (chronic obstructive pulmonary disease) (HCC)   . Emphysema lung (HCC)   . Emphysema of lung (HCC)   . Hepatitis A infection ~ 1980  . High cholesterol   . Hypertension   . Pneumonia 1960s  . Rheumatoid arthritis (HCC)    "hands mostly"  ( 01/19/2016)  . Situational depression 2009/12/23   "only when my mother died"    Family History  Problem Relation Age of Onset  . Cancer Mother   . Arthritis Mother   . Stroke Father   . Hypertension Father   . Colon cancer Neg Hx     Past Surgical History:  Procedure Laterality Date  . APPENDECTOMY  1980's  . CHEST TUBE INSERTION Left 1990's   "lung collapsed"  . CYSTECTOMY Left 1960's   "wrist"  . FOOT FRACTURE SURGERY Left ~ 12/24/2003   "it was crushed"  . IR GENERIC HISTORICAL  03/27/2016   IR ANGIOGRAM FOLLOW UP STUDY 03/27/2016 Julieanne Cotton, MD [facility]  . IR GENERIC HISTORICAL  03/27/2016   IR ANGIOGRAM FOLLOW UP STUDY 03/27/2016 Julieanne Cotton, MD [facility]  . IR GENERIC HISTORICAL  03/27/2016   IR ANGIO INTRA EXTRACRAN SEL COM CAROTID INNOMINATE UNI L MOD SED 03/27/2016 Julieanne Cotton, MD [facility]  . IR GENERIC HISTORICAL  03/27/2016   IR ANGIOGRAM FOLLOW UP STUDY 03/27/2016 Julieanne Cotton, MD [facility]  . IR GENERIC HISTORICAL  03/27/2016   IR ANGIO VERTEBRAL SEL SUBCLAVIAN INNOMINATE UNI R MOD SED 03/27/2016 Julieanne Cotton, MD [facility]  . IR GENERIC HISTORICAL  03/27/2016   IR 3D INDEPENDENT WKST 03/27/2016 Julieanne Cotton, MD [facility]  . IR GENERIC HISTORICAL  03/27/2016   IR ANGIOGRAM FOLLOW UP STUDY 03/27/2016 Julieanne Cotton, MD [facility]  . IR GENERIC HISTORICAL  03/27/2016   IR NEURO EACH ADD'L AFTER BASIC UNI RIGHT (MS) 03/27/2016 Julieanne Cotton, MD [facility]  . IR GENERIC HISTORICAL  03/27/2016   IR ANGIOGRAM FOLLOW UP STUDY 03/27/2016 Julieanne Cotton, MD [facility]  . IR GENERIC HISTORICAL  03/27/2016   IR TRANSCATH/EMBOLIZ 03/27/2016 Julieanne Cotton,  MD [facility]  . IR GENERIC HISTORICAL  03/27/2016   IR ANGIO INTRA EXTRACRAN SEL INTERNAL CAROTID UNI R MOD SED 03/27/2016 Julieanne Cotton, MD [facility]  . IR GENERIC HISTORICAL  03/27/2016   IR ANGIOGRAM FOLLOW UP STUDY 03/27/2016 Julieanne Cotton, MD [facility]  . IR GENERIC HISTORICAL  04/12/2016   IR RADIOLOGIST EVAL & MGMT 04/12/2016 [facility]  . RADIOLOGY WITH ANESTHESIA N/A 02/29/2016   Procedure: EMBOLIZATION     (RADIOLOGY WITH ANESTHESIA);  Surgeon: Julieanne Cotton, MD;  Location: [facility] OR;  Service: Radiology;  Laterality: N/A;  . RADIOLOGY WITH ANESTHESIA N/A 03/27/2016   Procedure: Embolization;  Surgeon: Julieanne Cotton, MD;  Location: [facility] OR;  Service: Radiology;  Laterality: N/A;  . VIDEO BRONCHOSCOPY Bilateral 12/06/2013   Procedure: VIDEO BRONCHOSCOPY WITHOUT FLUORO;  Surgeon: Alyson Reedy, MD;  Location: [facility]  ENDOSCOPY;  Service: Cardiopulmonary;  Laterality: Bilateral;   Social History   Occupational History  . Not on file  Tobacco Use  . Smoking status: Current Every Day Smoker    Packs/day: 0.50    Years: 40.00    Pack years: 20.00    Types: Cigarettes  . Smokeless tobacco: Never Used  . Tobacco comment: smoking 1/2ppd as of 05/23/17  ep  Substance and Sexual Activity  . Alcohol use: Yes    Alcohol/week: 6.0 oz    Types: 10 Cans of beer per week    Comment: 12/04/2013 "2beers/day"  . Drug use: No    Comment: 01/19/2016 "nothing in the last 3 months"  . Sexual activity: Yes

## 2017-08-25 ENCOUNTER — Ambulatory Visit (INDEPENDENT_AMBULATORY_CARE_PROVIDER_SITE_OTHER): Payer: 59 | Admitting: Internal Medicine

## 2017-08-25 ENCOUNTER — Encounter: Payer: Self-pay | Admitting: Internal Medicine

## 2017-08-25 VITALS — BP 130/70 | HR 77 | Ht 72.0 in | Wt 189.8 lb

## 2017-08-25 DIAGNOSIS — G4734 Idiopathic sleep related nonobstructive alveolar hypoventilation: Secondary | ICD-10-CM

## 2017-08-25 DIAGNOSIS — F17210 Nicotine dependence, cigarettes, uncomplicated: Secondary | ICD-10-CM

## 2017-08-25 DIAGNOSIS — Z01811 Encounter for preprocedural respiratory examination: Secondary | ICD-10-CM

## 2017-08-25 DIAGNOSIS — J441 Chronic obstructive pulmonary disease with (acute) exacerbation: Secondary | ICD-10-CM

## 2017-08-25 DIAGNOSIS — J449 Chronic obstructive pulmonary disease, unspecified: Secondary | ICD-10-CM | POA: Diagnosis not present

## 2017-08-25 MED ORDER — DOXYCYCLINE HYCLATE 100 MG PO TABS: 100.0000 mg | ORAL_TABLET | Freq: Two times a day (BID) | ORAL | 0 refills | Status: AC

## 2017-08-25 MED ORDER — PREDNISONE 10 MG PO TABS: ORAL_TABLET | ORAL | 0 refills | Status: DC

## 2017-08-25 MED FILL — predniSONE 10 MG TABS: 10 | 5 days supply | Qty: 11 | Fill #0

## 2017-08-25 MED FILL — DOXYCYCLINE HYCLATE 100 MG: 100 | 5 days supply | Qty: 10 | Fill #0

## 2017-08-25 NOTE — Patient Instructions (Addendum)
COPD exacerbation (HCC)   - Please take prednisone 40 mg x1 day, then 30 mg x1 day, then 20 mg x1 day, then 10 mg x1 day, and then 5 mg x1 day and stop - Take doxycycline 100mg  po twice daily x 5 days; take after meals and avoid sunlight   Cigarette smoker  - please work on quitting asap  COPD, severe (HCC) Nocturnal hypoxemia  - continue advair, spiriva scheduled and daliresp -= continue night o2 - CMA to add this to med list  - continue alb as needed  Preop respiratory exam - low moderate risk for respiratory comp0lications followup Right HIp surgery = will send note to Dr  Followup 3 months or sooner if needed

## 2017-08-25 NOTE — Progress Notes (Signed)
Subjective:     Patient ID: Derek Conner, male   DOB: [birth year], 57 y.o.   MRN: [id number]  HPI   57 yo Native American/ smoker GOLD III copd as of 10/25/16 pfts on a typical day p taking spiriva/daliresp/advair 500     OV 07/24/2016  Chief Complaint  Patient presents with  . Follow-up    Pt states overall his breathing is doing well. Pt states he has an occassional prod cough with clear to white mucus. Pt denies CP/tightness and f/c/s.     [name] with smoking, pulmonary nodules in September 2016, frequent COPD exacerbation and severe COPD based on 2000 1550  Admitted 05/16/2016 for COPD exacerbation. After that he followed up with my colleague and was doing well. Since then he's continued to do well. His COPD stable. He presents with his wife/girlfriend who is a Engineer, civil (consulting) and 60s at [facility]. He continues to refuse vaccines. He is taking triple inhaler therapy and also Roflumilast. He has lost 25 pounds due to dietary changes and it is intentional. He feels overall well. He continues to smoke a little bit and he knows the importance of quitting. Last pulmonary nodule was summer 2016 on CT chest. Chest x-rays this you have been clear.  11/15/2016 acute extended ov/Wert re: aecopd / active smoker Chief Complaint  Patient presents with  . Acute Visit    Pt c/o sinus pressure, HA, wheezing, increased SOB and cough for the past 4 days. Cough is prod with moderate amounts of green to yellow sputum. He is using his albuterol inhaler 3-4 x per day.   onset 11/12/16 flared on maint rx =  spiriva/daliresp/advair 500  plus on alb early p afternoon typically needs at least once daily    recently completed taper off pred / using saba now up to 4 x daily and noct but typically comfortable at rest p saba rx  Says can't take dulera or symbicort  Cough is worse at hs and in am and thinks this triggered the sob/ wheeze with lost of nasal congestion/obst symptoms  No obvious patterns in  day to day or daytime variability or assoc mucus plugs or hemoptysis or cp or chest tightness, subjective wheeze or overt   hb symptoms. No unusual exp hx or h/o childhood pna/ asthma or knowledge of premature birth.   Also denies any obvious fluctuation of symptoms with weather or environmental changes or other aggravating or alleviating factors except as outlined above     01/09/2017 2 week Follow Up Appointment Pt. Presents for follow up. He was seen 12/13/2015 for COPD exacerbation. He was treated with Doxycycline and a prednisone taper.He states his cough is better. He does still have secretions that are white to foamy.He no longer has purulent secretions. He is continuing to smoke. He states he is still short of breath.We discussed the shortness of breath and the correlation with smoking. We discussed the use of NicoDerm patches to assist with his attempt at smoking cessation. He states he has used these in the past. He cannot tolerate the 21 mg patch, so wants to use the 14 mg patches. I explained that I will prescribe Nicotrol inhaler for breakthrough if needed. We spent a long time discussing the need to quit smoking, as it is now affecting his ability to breathe. Patient denies fever, chest pain, orthopnea, or hemoptysis. He does complain of continued dyspnea on exertion. He is compliant with his Spiriva, Advair, and Daliresp. Last low-dose lung cancer screening  CT was read as a Lung RADS 4 A : suspicious findings, either short term follow up in 3 months or alternatively .3 month Follow up CT is due to July 2018.    Test Results:  Low Dose Lung Cancer Screening Follow Up CT:11/22/2016 No pleural effusion. Moderate changes of centrilobular emphysema noted. Diffuse bronchial wall thickening identified. Chronic scarring and architectural distortion within the posterior right upper lobe is again identified and appears similar to previous exam. Multiple pulmonary nodular densities are again  identified in both lungs. Within the left upper lobe there is an enlarging nodule which has an equivalent diameter of 7.8 mm,. On the previous exam this nodule was present with an equivalent diameter of 2.2 mm.   OV 02/10/2017  Chief Complaint  Patient presents with  . Follow-up    Pt c/o increase in SOB, prod cough with white mucus, chest tightness x 3 days. Pt denies f/c/s.    Follow-up  #Advanced COPD: He is here with his wife. I did do an FMLA form for his wife in the last few days. He tells me that he is in the midst of an exacerbation. Overall in 2018 he has not had any admissions. He and his wife believe that the strategy of coming to the office or calling during an exacerbation has prevented admissions. 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 He thinks  Working hard and exposure to carpets makes his copd flare up. He is allergic to breo and dulera and does not want to change his spiriva and advair as as a result. He ison daliresp and is working well. He DOES NOT think he is ready for disability. Says he loves work  #lung nodule - left side has growin in June 2018. Has fu CT February 21, 2017  #Smoking - stil does  OV 05/23/2017  Chief Complaint  Patient presents with  . Follow-up    Pt states that he has been doing good. Does still have some chest congestion and occ. cough with yellow mucus. Denies any SOB or CP.    Follow-up  Advanced COPD:Last saw him 05/08/2017 at the time of research nodule study. At that time he had COPD exacerbation. We'll give him antibodies and prednisone. Currently he is back to baseline. 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 He takes Advair and Spiriva and this works well for him. He is also on Daliresp. He still smokes and will not quit. He does not want a flu shot. In terms of his  Lung nodule: He is a repeat CT scan of the chest6 months from July 2018   OV 08/25/2017  Chief Complaint   Patient presents with  . Follow-up    feels like cough and SOB are better,has some chest congestion,surgical clearance for r hip 09/23/17 then replace the L hip later on   Follow-up advanced stage 3 COPD: He feels he is in the beginning stages of a COPD exacerbation.  For the last few days his chest is congested and is wheezing a little bit more.  He is interested in antibiotics and prednisone.  Otherwise he is compliant with Spiriva, Advair, Daliresp to prevent exacerbations.  Recently saw my nurse practitioner Sarah and was started on nocturnal oxygen.  He wants his listed on his medication list.  COPD CAT score is 21  Follow-up smoking: He continues to smoke but is trying to quit  New issue of preoperative lung clearance.  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  He is going to have right hip surgery first.  This can be done by Dr. August Saucer.  The surgery will take place at [facility] and not an outpatient surgical center.  He tells me that over 8 years ago he had rheumatoid arthritis and was on Humira.  Since then the rheumatoid arthritis has not bothered him.  He is off Humira because of side effects.  We did a preoperative clearance assessment as documented below.  Currently is using crutches.  He is on FMLA/disability.  He is in severe pain.     [place] Postperative Pulmonary Risk Score -for prolonged mech vent dependence > 5 days Comment Score  Type of surgery - abd ao aneurysm (27), thoracic (21), neurosurgery / upper abdominal / vascular (21), neck (11) Hip surger 0  Emergency Surgery - (11) no 0  ALbumin < 3 or poor nutritional state - (9) 4.2 in may 2018 0  BUN > 30 -  (8) 17 Jun 2017 0  Partial or completely dependent functional status - (7) partiall depen  7  COPD -  (6) copd 6  Age - 71 to 74 (4), > 70  (6) Age 55 0  TOTAL  13  Risk Stratifcation scores  - < 10, 11-19, 20-27, 28-40, >40  Low mod risk     CANET Postperative Pulmonary Risk Score - any  resp complication Comment Score  Age - <50 (0), 50-80 (3), >80 (16) 57 3  Preoperative pulse ox - >96 (0), 91-95 (8), <90 (24) 100% 0  Respiratory infection in last month - Yes (17) no 17 - rx on 08/25/2017   Preoperative anemia - < 10gm% - Yes (11) 13.5 in nov 2018 0  Surgical incision - Upper abdominal (15), Thoracic (24) hip 0  Duration of surgery - <2h (0), 2-3h (16), >3h (23) 3 16  Emergency Surgery - Yes (8) no 0  TOTAL  36  Risk Stratification - Low (<26), Intermediate (26-44), High (>45)  intermediate riskj      has a past medical history of Asthma, CAP (community acquired pneumonia) (01/19/2016), Chronic bronchitis (HCC), COPD (chronic obstructive pulmonary disease) (HCC), Emphysema lung (HCC), Emphysema of lung (HCC), Hepatitis A infection (~ 1980), High cholesterol, Hypertension, Pneumonia (1960s), Rheumatoid arthritis (HCC), and Situational depression (2011).   reports that he has been smoking cigarettes.  He has a 20.00 pack-year smoking history. he has never used smokeless tobacco.  Past Surgical History:  Procedure Laterality Date  . APPENDECTOMY  1980's  . CHEST TUBE INSERTION Left 1990's   "lung collapsed"  . CYSTECTOMY Left 1960's   "wrist"  . FOOT FRACTURE SURGERY Left ~ 2005   "it was crushed"  . IR GENERIC HISTORICAL  03/27/2016   IR ANGIOGRAM FOLLOW UP STUDY 03/27/2016 Julieanne Cotton, MD [facility]  . IR GENERIC HISTORICAL  03/27/2016   IR ANGIOGRAM FOLLOW UP STUDY 03/27/2016 Julieanne Cotton, MD [facility]  . IR GENERIC HISTORICAL  03/27/2016   IR ANGIO INTRA EXTRACRAN SEL COM CAROTID INNOMINATE UNI L MOD SED 03/27/2016 Julieanne Cotton, MD [facility]  . IR GENERIC HISTORICAL  03/27/2016   IR ANGIOGRAM FOLLOW UP STUDY 03/27/2016 Julieanne Cotton, MD [facility]  . IR GENERIC HISTORICAL  03/27/2016   IR ANGIO VERTEBRAL SEL SUBCLAVIAN INNOMINATE UNI R MOD SED 03/27/2016 Julieanne Cotton, MD [facility]  . IR GENERIC HISTORICAL  03/27/2016   IR 3D  INDEPENDENT WKST 03/27/2016 Julieanne Cotton,  MD [facility]  . IR GENERIC HISTORICAL  03/27/2016   IR ANGIOGRAM FOLLOW UP STUDY 03/27/2016 Julieanne Cotton, MD [facility]  . IR GENERIC HISTORICAL  03/27/2016   IR NEURO EACH ADD'L AFTER BASIC UNI RIGHT (MS) 03/27/2016 Julieanne Cotton, MD [facility]  . IR GENERIC HISTORICAL  03/27/2016   IR ANGIOGRAM FOLLOW UP STUDY 03/27/2016 Julieanne Cotton, MD [facility]  . IR GENERIC HISTORICAL  03/27/2016   IR TRANSCATH/EMBOLIZ 03/27/2016 Julieanne Cotton, MD [facility]  . IR GENERIC HISTORICAL  03/27/2016   IR ANGIO INTRA EXTRACRAN SEL INTERNAL CAROTID UNI R MOD SED 03/27/2016 Julieanne Cotton, MD [facility]  . IR GENERIC HISTORICAL  03/27/2016   IR ANGIOGRAM FOLLOW UP STUDY 03/27/2016 Julieanne Cotton, MD [facility]  . IR GENERIC HISTORICAL  04/12/2016   IR RADIOLOGIST EVAL & MGMT 04/12/2016 [facility]  . RADIOLOGY WITH ANESTHESIA N/A 02/29/2016   Procedure: EMBOLIZATION     (RADIOLOGY WITH ANESTHESIA);  Surgeon: Julieanne Cotton, MD;  Location: [facility] OR;  Service: Radiology;  Laterality: N/A;  . RADIOLOGY WITH ANESTHESIA N/A 03/27/2016   Procedure: Embolization;  Surgeon: Julieanne Cotton, MD;  Location: [facility] OR;  Service: Radiology;  Laterality: N/A;  . VIDEO BRONCHOSCOPY Bilateral 12/06/2013   Procedure: VIDEO BRONCHOSCOPY WITHOUT FLUORO;  Surgeon: Alyson Reedy, MD;  Location: [facility] ENDOSCOPY;  Service: Cardiopulmonary;  Laterality: Bilateral;    Allergies  Allergen Reactions  . Symbicort [Budesonide-Formoterol Fumarate] Shortness Of Breath, Nausea And Vomiting and Other (See Comments)    heachache  . Dulera [Mometasone Furo-Formoterol Fum] Other (See Comments)    Headache   . Humira [Adalimumab]     Immunization History  Administered Date(s) Administered  . Tdap 08/29/2015    Family History  Problem Relation Age of Onset  . Cancer Mother   . Arthritis Mother   . Stroke Father   . Hypertension Father   . Colon cancer Neg Hx       Current Outpatient Medications:  .  albuterol (PROVENTIL HFA;VENTOLIN HFA) 108 (90 Base) MCG/ACT inhaler, Inhale 2 puffs into the lungs every 4 (four) hours as needed for wheezing or shortness of breath., Disp: , Rfl:  .  albuterol (PROVENTIL) (2.5 MG/3ML) 0.083% nebulizer solution, Take 3 mLs (2.5 mg total) by nebulization every 6 (six) hours as needed for wheezing or shortness of breath. DX: J44.9, Disp: 75 mL, Rfl: 12 .  ALPRAZolam (XANAX) 0.5 MG tablet, 1 tab po q day as needed severe anxiety/panic attack. (Patient taking differently: Take 0.5 mg by mouth daily as needed for anxiety. 1 tab po q day as needed severe anxiety/panic attack.), Disp: 30 tablet, Rfl: 0 .  amLODipine (NORVASC) 10 MG tablet, TAKE 1 TABLET (10 MG) BY MOUTH DAILY., Disp: 90 tablet, Rfl: 3 .  aspirin EC 81 MG tablet, Take 81 mg by mouth daily., Disp: , Rfl:  .  cloNIDine (CATAPRES) 0.2 MG tablet, Take 1 tablet (0.2 mg total) by mouth 2 (two) times daily., Disp: 60 tablet, Rfl: 11 .  cyclobenzaprine (FLEXERIL) 10 MG tablet, Take 1 tablet (10 mg total) by mouth 2 (two) times daily as needed for muscle spasms., Disp: 30 tablet, Rfl: 0 .  DALIRESP 500 MCG TABS tablet, TAKE 1 TABLET (500 MCG TOTAL) BY MOUTH DAILY., Disp: 30 tablet, Rfl: 3 .  famotidine (PEPCID) 20 MG tablet, TAKE 1 TABLET (20 MG TOTAL) BY MOUTH AT BEDTIME., Disp: 90 tablet, Rfl: 1 .  fenofibrate (TRICOR) 48 MG tablet, TAKE 1 TABLET (48 MG TOTAL)  BY MOUTH DAILY., Disp: 30 tablet, Rfl: 3 .  Fluticasone-Salmeterol (ADVAIR DISKUS) 500-50 MCG/DOSE AEPB, Inhale 1 puff into the lungs 2 (two) times daily., Disp: 60 each, Rfl: 3 .  HYDROcodone-acetaminophen (NORCO) 5-325 MG tablet, Take 1 tablet every 6 (six) hours as needed by mouth for moderate pain or severe pain., Disp: 12 tablet, Rfl: 0 .  pantoprazole (PROTONIX) 40 MG tablet, Take 1 tablet (40 mg total) by mouth daily at 12 noon., Disp: 30 tablet, Rfl: 3 .  ticagrelor (BRILINTA) 90 MG TABS tablet, Take 45-90  mg by mouth 2 (two) times daily. 1 in the morning and 1/2 in the evening, Disp: , Rfl:  .  tiotropium (SPIRIVA HANDIHALER) 18 MCG inhalation capsule, PLACE 1 CAPSULE INTO INHALER AND INHALE INTO LUNGS DAILY., Disp: 90 capsule, Rfl: 3 .  b complex vitamins tablet, Take 1 tablet by mouth daily., Disp: , Rfl:  No current facility-administered medications for this visit.   Facility-Administered Medications Ordered in Other Visits:  .  0.9 %  sodium chloride infusion, , Intravenous, Continuous, Ralene Muskrat, PA-C .  aspirin EC tablet 325 mg, 325 mg, Oral, 60 min Pre-Op, Turpin, Pamela, PA-C .  niMODipine (NIMOTOP) capsule 0-60 mg, 0-60 mg, Oral, 60 min Pre-Op, Ralene Muskrat, PA-C   Review of Systems     Objective:   Physical Exam  Constitutional: He is oriented to person, place, and time. He appears well-developed and well-nourished. No distress.  HENT:  Head: Normocephalic and atraumatic.  Right Ear: External ear normal.  Left Ear: External ear normal.  Mouth/Throat: Oropharynx is clear and moist. No oropharyngeal exudate.  Eyes: Conjunctivae and EOM are normal. Pupils are equal, round, and reactive to light. Right eye exhibits no discharge. Left eye exhibits no discharge. No scleral icterus.  Neck: Normal range of motion. Neck supple. No JVD present. No tracheal deviation present. No thyromegaly present.  Cardiovascular: Normal rate, regular rhythm and intact distal pulses. Exam reveals no gallop and no friction rub.  No murmur heard. Pulmonary/Chest: Effort normal and breath sounds normal. No respiratory distress. He has no wheezes. He has no rales. He exhibits no tenderness.  Mild faint wheeze left base - otherwise clear  Abdominal: Soft. Bowel sounds are normal. He exhibits no distension and no mass. There is no tenderness. There is no rebound and no guarding.  Musculoskeletal: Normal range of motion. He exhibits no edema or tenderness.  Sitting with crutches  Lymphadenopathy:     He has no cervical adenopathy.  Neurological: He is alert and oriented to person, place, and time. He has normal reflexes. No cranial nerve deficit. Coordination normal.  Skin: Skin is warm and dry. No rash noted. He is not diaphoretic. No erythema. No pallor.  Psychiatric: He has a normal mood and affect. His behavior is normal. Judgment and thought content normal.  Nursing note and vitals reviewed. \  Vitals:   08/25/17 1052  BP: 130/70  Pulse: 77  SpO2: 100%  Weight: 189 lb 12.8 oz (86.1 kg)  Height: 6' (1.829 m)    Estimated body mass index is 25.74 kg/m as calculated from the following:   Height as of this encounter: 6' (1.829 m).   Weight as of this encounter: 189 lb 12.8 oz (86.1 kg).      Assessment:       ICD-10-CM   1. COPD exacerbation (HCC) J44.1   2. Nocturnal hypoxemia G47.34   3. Cigarette smoker F17.210   4. COPD, severe (HCC) J44.9  5. Preop respiratory exam Z01.811        Plan:     COPD exacerbation (HCC)   - Please take prednisone 40 mg x1 day, then 30 mg x1 day, then 20 mg x1 day, then 10 mg x1 day, and then 5 mg x1 day and stop - Take doxycycline 100mg  po twice daily x 5 days; take after meals and avoid sunlight   Cigarette smoker  - please work on quitting asap  COPD, severe (HCC) Nocturnal hypoxemia  - continue advair, spiriva scheduled -= continue night o2 - CMA to add this to med list  - continue alb as needed  Preop respiratory exam - low moderate risk for respiratory comp0lications followup Right HIp surgery  Risk ameliorating factors are normal hemoglobin, normal renal function , age and nutrition status   Major Pulmonary risks identified in the multifactorial risk analysis are but not limited to a) pneumonia; b) recurrent intubation risk; c) prolonged or recurrent acute respiratory failure needing mechanical ventilation; d) prolonged hospitalization; e) DVT/Pulmonary embolism; f) Acute Pulmonary edema  Recommend 1. Short  duration of surgery as much as possible and avoid paralytic if possible 2. Recovery in step down or ICU  3. DVT prophylaxis 4. Aggressive pulmonary toilet with o2, bronchodilatation, and incentive spirometry and early ambulation     Followup 3 months or sooner if needed   Dr. Kalman Shan, M.D., [facility].C.P Pulmonary and Critical Care Medicine Staff Physician, [facility] Director - Interstitial Lung Disease  Program  Pulmonary Fibrosis [facility] at [facility] [place], [place], [address]  Pager: [phone], If no answer or between  15:00h - 7:00h: call [phone] Telephone: [phone]

## 2017-08-28 ENCOUNTER — Ambulatory Visit (HOSPITAL_BASED_OUTPATIENT_CLINIC_OR_DEPARTMENT_OTHER)
Admission: RE | Admit: 2017-08-28 | Discharge: 2017-08-28 | Disposition: A | Discharge status: Home / Self Care | Payer: 59 | Source: Ambulatory Visit | Attending: Acute Care | Admitting: Acute Care

## 2017-08-28 DIAGNOSIS — R911 Solitary pulmonary nodule: Secondary | ICD-10-CM | POA: Diagnosis not present

## 2017-08-28 DIAGNOSIS — F17210 Nicotine dependence, cigarettes, uncomplicated: Secondary | ICD-10-CM | POA: Diagnosis not present

## 2017-08-28 DIAGNOSIS — J432 Centrilobular emphysema: Secondary | ICD-10-CM | POA: Insufficient documentation

## 2017-08-28 DIAGNOSIS — K760 Fatty (change of) liver, not elsewhere classified: Secondary | ICD-10-CM | POA: Insufficient documentation

## 2017-08-28 DIAGNOSIS — J438 Other emphysema: Secondary | ICD-10-CM | POA: Diagnosis not present

## 2017-08-28 DIAGNOSIS — I700 Atherosclerosis of aorta: Secondary | ICD-10-CM | POA: Insufficient documentation

## 2017-09-01 MED FILL — DALIRESP 500 MCG TABLET: 500 | 30 days supply | Qty: 30 | Fill #3

## 2017-09-01 MED FILL — ADVAIR 500/50 DISKUS: 500-50 | 30 days supply | Qty: 60 | Fill #2

## 2017-09-01 MED FILL — BRILINTA 90 MG TABLET: 90 | 30 days supply | Qty: 60 | Fill #0

## 2017-09-07 DIAGNOSIS — J449 Chronic obstructive pulmonary disease, unspecified: Secondary | ICD-10-CM | POA: Diagnosis not present

## 2017-09-09 ENCOUNTER — Telehealth: Payer: Self-pay | Admitting: Acute Care

## 2017-09-09 NOTE — Telephone Encounter (Signed)
I have called the patient with the results of his low dose CT. I explained that his scan was read as a lung RADS 0, which indicates an incomplete examination for an examination that is considered nondiagnostic.  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  I explained that this is proven to be repeatedly problematic for screening, and that this patient is no longer recommended to receive annual screening scans due to the high risk of false positivity.  Outpatient evaluation by pulmonary and treatment for presumed chronic atypical infection is recommended.  I explained this to the patient who has had repeated COPD exacerbations which have required treatment with doxycycline.  The patient actually had been seen in the office on August 25, 2017 with a COPD exacerbation requiring antibiotic and prednisone treatment and then went to be scanned on August 28, 2017.  I have explained to Mr. Frigon several times that we do not want him going for screening if he is actively sick.  Additionally he is continuing to smoke.  He is currently planning on having hip surgery on September 23, 2017.  I have spoken with Dr. Marchelle Gearing regarding this patient.  He recommended waiting until the patient recovered from his hip surgery and then addressing how we will continue to follow his nodules.  I explained that we need to get a good  sputum culture to identify any bacteria that might be growing in his lungs.  Plan will be to do that after his surgery as the patient is now currently taking doxycycline and on antibiotic therapy.  The patient currently has a follow-up appointment scheduled with Dr. Marchelle Gearing December 01, 2017.  Dr. Marchelle Gearing will address sputum culture and nodule follow up with  this with the patient at that time.  There was  additional notation of an abnormality in the upper pole of the left kidney.  Recommendation was for nonemergent renal ultrasound in the near future.  I explained to the patient that we will send this information to his primary care provider who can determine the timing of a follow-up ultrasound, and order as he feels is clinically appropriate and indicated  Patient verbalized understanding and had no further questions at completion of the call.

## 2017-09-09 NOTE — Telephone Encounter (Signed)
Thanks for the update on your treatment plan.

## 2017-09-12 ENCOUNTER — Other Ambulatory Visit (INDEPENDENT_AMBULATORY_CARE_PROVIDER_SITE_OTHER): Payer: Self-pay

## 2017-09-12 NOTE — Pre-Procedure Instructions (Addendum)
Vamsi Apfel  09/12/2017      [facility] - [place], [place] - [address] [place] [address] Phone: [phone] Fax: [phone]    Your procedure is scheduled on Tuesday February 19.  Report to [facility] Admitting at 5:30 A.M.  Call this number if you have problems the morning of surgery:  [phone]   Remember:  Do not eat food or drink liquids after midnight.  Take these medicines the morning of surgery with A SIP OF WATER:   Amlodipine (norvasc) Clonidine (catapres) Hydrocodone-acetaminophen (Norco) if needed OR oxycodone-acetaminophen (Percocet) if needed Cyclobenzaprine (flexeril) if needed Albuterol if needed (please bring inhaler to hospital with you) Alprazolam (Xanax) if needed [facility] ADVAIR  7 days prior to surgery STOP taking any Aleve, Naproxen, Ibuprofen, Motrin, Advil, Goody's, BC's, all herbal medications, fish oil, and all vitamins  **Follow your surgeon's instructions on stopping Aspirin and Brilinta (ticagrelor). If no instructions were given, please call surgeon's office**    Do not wear jewelry, make-up or nail polish.  Do not wear lotions, powders, or perfumes, or deodorant.  Do not shave 48 hours prior to surgery.  Men may shave face and neck.  Do not bring valuables to the hospital.  [facility] is not responsible for any belongings or valuables.  Contacts, dentures or bridgework may not be worn into surgery.  Leave your suitcase in the car.  After surgery it may be brought to your room.  For patients admitted to the hospital, discharge time will be determined by your treatment team.  Patients discharged the day of surgery will not be allowed to drive home.   Special instructions:    [place]- Preparing For Surgery  Before surgery, you can play an important role. Because skin is not sterile, your skin needs to be as free of germs as possible. You  can reduce the number of germs on your skin by washing with CHG (chlorahexidine gluconate) Soap before surgery.  CHG is an antiseptic cleaner which kills germs and bonds with the skin to continue killing germs even after washing.  Please do not use if you have an allergy to CHG or antibacterial soaps. If your skin becomes reddened/irritated stop using the CHG.  Do not shave (including legs and underarms) for at least 48 hours prior to first CHG shower. It is OK to shave your face.  Please follow these instructions carefully.   1. Shower the NIGHT BEFORE SURGERY and the MORNING OF SURGERY with CHG.   2. If you chose to wash your hair, wash your hair first as usual with your normal shampoo.  3. After you shampoo, rinse your hair and body thoroughly to remove the shampoo.  4. Use CHG as you would any other liquid soap. You can apply CHG directly to the skin and wash gently with a scrungie or a clean washcloth.   5. Apply the CHG Soap to your body ONLY FROM THE NECK DOWN.  Do not use on open wounds or open sores. Avoid contact with your eyes, ears, mouth and genitals (private parts). Wash Face and genitals (private parts)  with your normal soap.  6. Wash thoroughly, paying special attention to the area where your surgery will be performed.  7. Thoroughly rinse your body with warm water from the neck down.  8. DO NOT shower/wash with your normal soap after using and rinsing off the CHG Soap.  9. Pat yourself dry with a CLEAN TOWEL.  10. Wear CLEAN PAJAMAS to bed the night before surgery, wear comfortable clothes the morning of surgery  11. Place CLEAN SHEETS on your bed the night of your first shower and DO NOT SLEEP WITH PETS.    Day of Surgery: Do not apply any deodorants/lotions. Please wear clean clothes to the hospital/surgery center.      Please read over the following fact sheets that you were given. Coughing and Deep Breathing, Total Joint Packet, MRSA Information and Surgical  Site Infection Prevention

## 2017-09-15 ENCOUNTER — Encounter (HOSPITAL_COMMUNITY): Payer: Self-pay

## 2017-09-15 ENCOUNTER — Other Ambulatory Visit: Payer: Self-pay

## 2017-09-15 ENCOUNTER — Other Ambulatory Visit (INDEPENDENT_AMBULATORY_CARE_PROVIDER_SITE_OTHER): Payer: Self-pay | Admitting: Orthopedic Surgery

## 2017-09-15 ENCOUNTER — Telehealth: Payer: Self-pay | Admitting: Radiology

## 2017-09-15 ENCOUNTER — Encounter (HOSPITAL_COMMUNITY)
Admission: RE | Admit: 2017-09-15 | Discharge: 2017-09-15 | Disposition: A | Discharge status: Home / Self Care | Payer: 59 | Source: Ambulatory Visit | Attending: Orthopedic Surgery | Admitting: Orthopedic Surgery

## 2017-09-15 DIAGNOSIS — J449 Chronic obstructive pulmonary disease, unspecified: Secondary | ICD-10-CM | POA: Diagnosis not present

## 2017-09-15 DIAGNOSIS — Z01812 Encounter for preprocedural laboratory examination: Secondary | ICD-10-CM | POA: Insufficient documentation

## 2017-09-15 DIAGNOSIS — Z7982 Long term (current) use of aspirin: Secondary | ICD-10-CM | POA: Diagnosis not present

## 2017-09-15 DIAGNOSIS — F17200 Nicotine dependence, unspecified, uncomplicated: Secondary | ICD-10-CM | POA: Diagnosis not present

## 2017-09-15 DIAGNOSIS — M069 Rheumatoid arthritis, unspecified: Secondary | ICD-10-CM | POA: Insufficient documentation

## 2017-09-15 DIAGNOSIS — M87051 Idiopathic aseptic necrosis of right femur: Secondary | ICD-10-CM

## 2017-09-15 DIAGNOSIS — I10 Essential (primary) hypertension: Secondary | ICD-10-CM | POA: Insufficient documentation

## 2017-09-15 DIAGNOSIS — K219 Gastro-esophageal reflux disease without esophagitis: Secondary | ICD-10-CM | POA: Diagnosis not present

## 2017-09-15 DIAGNOSIS — E785 Hyperlipidemia, unspecified: Secondary | ICD-10-CM | POA: Insufficient documentation

## 2017-09-15 DIAGNOSIS — Z79899 Other long term (current) drug therapy: Secondary | ICD-10-CM | POA: Diagnosis not present

## 2017-09-15 HISTORY — DX: Cerebral infarction, unspecified: I63.9

## 2017-09-15 HISTORY — DX: Gastro-esophageal reflux disease without esophagitis: K21.9

## 2017-09-15 LAB — COMPREHENSIVE METABOLIC PANEL
ALT: 19 U/L (ref 17–63)
ANION GAP: 10 (ref 5–15)
AST: 18 U/L (ref 15–41)
Albumin: 3.6 g/dL (ref 3.5–5.0)
Alkaline Phosphatase: 81 U/L (ref 38–126)
BUN: 19 mg/dL (ref 6–20)
CHLORIDE: 104 mmol/L (ref 101–111)
CO2: 23 mmol/L (ref 22–32)
Calcium: 10.1 mg/dL (ref 8.9–10.3)
Creatinine, Ser: 1.3 mg/dL — ABNORMAL HIGH (ref 0.61–1.24)
GFR, EST NON AFRICAN AMERICAN: 59 mL/min — AB (ref >60)
Glucose, Bld: 111 mg/dL — ABNORMAL HIGH (ref 65–99)
Potassium: 4.8 mmol/L (ref 3.5–5.1)
Sodium: 137 mmol/L (ref 135–145)
TOTAL PROTEIN: 6.5 g/dL (ref 6.5–8.1)
Total Bilirubin: 0.6 mg/dL (ref 0.3–1.2)

## 2017-09-15 LAB — CBC
HEMATOCRIT: 41.8 % (ref 39.0–52.0)
HEMOGLOBIN: 13.7 g/dL (ref 13.0–17.0)
MCH: 29 pg (ref 26.0–34.0)
MCHC: 32.8 g/dL (ref 30.0–36.0)
MCV: 88.4 fL (ref 78.0–100.0)
Platelets: 447 10*3/uL — ABNORMAL HIGH (ref 150–400)
RBC: 4.73 MIL/uL (ref 4.22–5.81)
RDW: 13 % (ref 11.5–15.5)
WBC: 10.4 10*3/uL (ref 4.0–10.5)

## 2017-09-15 LAB — SURGICAL PCR SCREEN
MRSA, PCR: NEGATIVE
Staphylococcus aureus: NEGATIVE

## 2017-09-15 NOTE — Progress Notes (Signed)
  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 Add Lovenox? Questions Per wife......Marland Kitchen  Discussed with Dr Corliss Skains Come off Brilinta now No need Lovenox Increase ASA 81 to ASA 325 mg daily.  Informed wife-- she has good understanding

## 2017-09-15 NOTE — Progress Notes (Signed)
PCP - Dr. Alvira Monday Cardiologist - denies Pulmonary- Dr. Marchelle Gearing  Chest x-ray - 06/23/17 EKG - 06/23/17 ECHO - 01/23/16   Sleep Study - 02/07/17  Blood Thinner Instructions:  Pt was not given instructions on when to stop taking Aspirin or Brilinta. Left message for Sherie at Dr. Diamantina Providence office about this. Pt to call office today or tomorrow for instructions as well.   Anesthesia review: Pulmonary Hx, CXR. Pt wears 2Lnc O2 at night.   Patient denies shortness of breath, fever, cough and chest pain at PAT appointment. Pt states he had pneumonia recently but this is better now.    Patient verbalized understanding of instructions that were given to them at the PAT appointment. Patient was also instructed that they will need to review over the PAT instructions again at home before surgery.

## 2017-09-16 MED FILL — AMLODIPINE BESYLATE 10 MG T: 10 | 90 days supply | Qty: 90 | Fill #2

## 2017-09-16 NOTE — Progress Notes (Signed)
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  Current smoker.  BMI 25.  Medications include: Albuterol, amlodipine, ASA 81 mg, clonidine, Daliresp, fenofibrate, Advair, Brilinta, Spiriva. Brilinta is prescribed by Dr. Corliss Skains (interventional radiology); note by Beckey Downing, PA 09/15/17 documents pt to stop Brilinta as of 09/16/17, ASA increased from 81mg  to 325mg .   BP 128/74   Pulse 88   Temp 36.8 C   Resp 20   Ht 6' (1.829 m)   Wt 184 lb 8 oz (83.7 kg)   SpO2 96%   BMI 25.02 kg/m   Preoperative labs reviewed.   - T&S, UA will obtained day of surgery  CT chest 08/28/17:  1. There is again a waxing and waning pattern of micro and macro nodularity in this patient. 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 This is proved repeatedly problematic from a screening perspective, and this patient is no longer recommended to receive annual lung cancer screening chest CT due to the high risk of false positivity. Outpatient evaluation by pulmonology and treatment for presumed chronic atypical infection is recommended. Lung-RADS 0, incomplete (examination considered nondiagnostic for above reasons). 2. Diffuse bronchial wall thickening with moderate centrilobular and paraseptal emphysema; imaging findings suggestive of underlying COPD. 3. Unusual appearance of the upper pole  of the left kidney where there is an area of intermediate attenuation. Further evaluation with nonemergent renal ultrasound is recommended in the near future to exclude the presence of a lesion. 4. Aortic atherosclerosis. 5. Hepatic steatosis.  CXR 06/22/17:  - COPD. Biapical nodular densities, likely scarring. No active disease.  EKG 06/22/17: NSR. LVH. ST elevation, consider early repolarization  Echo 01/23/16:  - Left ventricle: The cavity size was normal. There was mild concentric hypertrophy. Systolic function was normal. The estimated ejection fraction was in the range of 55% to 60%. Wall motion was normal; there were no regional wall motion abnormalities. Left ventricular diastolic function parameters were normal. - Aortic valve: Transvalvular velocity was within the normal range. There was no stenosis. There was no regurgitation. - Mitral valve: Transvalvular velocity was within the normal range. There was no evidence for stenosis. There was no regurgitation. - Left atrium: The atrium was moderately dilated. - Right ventricle: The cavity size was normal. Wall thickness was normal. Systolic function was normal. - Atrial septum: No defect or patent foramen ovale was identified by color flow Doppler. - Tricuspid valve: There was no regurgitation.  If no changes, I anticipate pt can proceed with surgery as scheduled.   06/24/17, FNP-BC [facility] Stay Surgical Center/Anesthesiology Phone: [phone] 09/16/2017 4:50 PM

## 2017-09-19 ENCOUNTER — Other Ambulatory Visit: Payer: Self-pay | Admitting: *Deleted

## 2017-09-19 NOTE — Patient Outreach (Addendum)
[facility]) Care Management  09/19/2017  Derek Conner 1961/01/24 [id number]   Subjective: Telephone call to patient's home / mobile number, spoke with patient, and HIPAA verified.  Discussed [facility] Care Management [facility]MR Transition of care follow up, preoperative call follow up, patient voiced understanding, and is in agreement to both types of follow up.   Patient states he is ready for his surgery on 09/23/17 at [facility], estimated length of stay 1 -2 days, and wife [name]) will assist with activities of daily living / home management as needed. Patient voices understanding of medical diagnosis, pending surgery,  and treatment plan. States he is accessing the following [employer] benefits: outpatient pharmacy, hospital indemnity (not sure if benefit chosen, will ask his wife who is [employer] employee to follow up, file claim if appropriate) and wife has family medical leave act (FMLA) in place to assist with patient's care.  Patient states he does not have any preoperative questions, care coordination, disease management, disease monitoring, transportation, community resource, or pharmacy needs at this time.   States he is very appreciative of the follow up and is in agreement to receive [facility] Care Management information post transition of care follow up.     Objective: Per KPN (Knowledge Performance Now, point of care tool) and chart review, patient to be admitted on 09/23/17 for  RIGHT TOTAL HIP ARTHROPLASTY ANTERIOR APPROACH at [facility].  Patient hospitalized  03/27/16 - 03/29/16 for Brain aneurysm.  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  Patient also has a history of hypertension, COPD, and Rheumatoid arthritis.   [facility] Care Management completed transition of care follow up on 04/01/16.    Assessment: Received [facility]MR Preoperative / Transition of Care referral on 09/18/17.   Preoperative call completed, and transition of  care follow up pending notification of patient discharge.     Plan:  RNCM will call patient for  telephone outreach attempt, transition of care follow up, within 3 business days of hospital discharge notification.    Arlett Goold H. Gardiner Barefoot, BSN, CCM [facility] Care Management [facility] Telephonic CM Phone: [phone] Fax: [phone]

## 2017-09-23 ENCOUNTER — Inpatient Hospital Stay (HOSPITAL_COMMUNITY): Payer: 59

## 2017-09-23 ENCOUNTER — Encounter (HOSPITAL_COMMUNITY): Payer: Self-pay | Admitting: *Deleted

## 2017-09-23 ENCOUNTER — Inpatient Hospital Stay (HOSPITAL_COMMUNITY): Payer: 59 | Admitting: Anesthesiology

## 2017-09-23 ENCOUNTER — Inpatient Hospital Stay (HOSPITAL_COMMUNITY)
Admission: RE | Admit: 2017-09-23 | Discharge: 2017-09-24 | DRG: 470 | Disposition: A | Discharge status: Home Health | Payer: 59 | Source: Ambulatory Visit | Attending: Orthopedic Surgery | Admitting: Orthopedic Surgery

## 2017-09-23 ENCOUNTER — Encounter (HOSPITAL_COMMUNITY)
Admission: RE | Disposition: A | Discharge status: Home Health | Payer: Self-pay | Source: Ambulatory Visit | Attending: Orthopedic Surgery

## 2017-09-23 ENCOUNTER — Inpatient Hospital Stay (HOSPITAL_COMMUNITY): Payer: 59 | Admitting: Emergency Medicine

## 2017-09-23 DIAGNOSIS — Z888 Allergy status to other drugs, medicaments and biological substances status: Secondary | ICD-10-CM | POA: Diagnosis not present

## 2017-09-23 DIAGNOSIS — M879 Osteonecrosis, unspecified: Principal | ICD-10-CM | POA: Diagnosis present

## 2017-09-23 DIAGNOSIS — M1610 Unilateral primary osteoarthritis, unspecified hip: Secondary | ICD-10-CM | POA: Diagnosis present

## 2017-09-23 DIAGNOSIS — M069 Rheumatoid arthritis, unspecified: Secondary | ICD-10-CM | POA: Diagnosis present

## 2017-09-23 DIAGNOSIS — Z8249 Family history of ischemic heart disease and other diseases of the circulatory system: Secondary | ICD-10-CM | POA: Diagnosis not present

## 2017-09-23 DIAGNOSIS — Z7982 Long term (current) use of aspirin: Secondary | ICD-10-CM | POA: Diagnosis not present

## 2017-09-23 DIAGNOSIS — Z7902 Long term (current) use of antithrombotics/antiplatelets: Secondary | ICD-10-CM | POA: Diagnosis not present

## 2017-09-23 DIAGNOSIS — Z8261 Family history of arthritis: Secondary | ICD-10-CM

## 2017-09-23 DIAGNOSIS — Z7952 Long term (current) use of systemic steroids: Secondary | ICD-10-CM

## 2017-09-23 DIAGNOSIS — R739 Hyperglycemia, unspecified: Secondary | ICD-10-CM | POA: Diagnosis not present

## 2017-09-23 DIAGNOSIS — Z8673 Personal history of transient ischemic attack (TIA), and cerebral infarction without residual deficits: Secondary | ICD-10-CM

## 2017-09-23 DIAGNOSIS — F329 Major depressive disorder, single episode, unspecified: Secondary | ICD-10-CM | POA: Diagnosis present

## 2017-09-23 DIAGNOSIS — E7800 Pure hypercholesterolemia, unspecified: Secondary | ICD-10-CM | POA: Diagnosis present

## 2017-09-23 DIAGNOSIS — K219 Gastro-esophageal reflux disease without esophagitis: Secondary | ICD-10-CM | POA: Diagnosis present

## 2017-09-23 DIAGNOSIS — I10 Essential (primary) hypertension: Secondary | ICD-10-CM | POA: Diagnosis present

## 2017-09-23 DIAGNOSIS — Z96641 Presence of right artificial hip joint: Secondary | ICD-10-CM | POA: Diagnosis not present

## 2017-09-23 DIAGNOSIS — Z809 Family history of malignant neoplasm, unspecified: Secondary | ICD-10-CM | POA: Diagnosis not present

## 2017-09-23 DIAGNOSIS — Z823 Family history of stroke: Secondary | ICD-10-CM | POA: Diagnosis not present

## 2017-09-23 DIAGNOSIS — F17210 Nicotine dependence, cigarettes, uncomplicated: Secondary | ICD-10-CM | POA: Diagnosis present

## 2017-09-23 DIAGNOSIS — M25551 Pain in right hip: Secondary | ICD-10-CM | POA: Diagnosis present

## 2017-09-23 DIAGNOSIS — M1611 Unilateral primary osteoarthritis, right hip: Secondary | ICD-10-CM | POA: Diagnosis not present

## 2017-09-23 DIAGNOSIS — J449 Chronic obstructive pulmonary disease, unspecified: Secondary | ICD-10-CM | POA: Diagnosis present

## 2017-09-23 DIAGNOSIS — I671 Cerebral aneurysm, nonruptured: Secondary | ICD-10-CM | POA: Diagnosis not present

## 2017-09-23 DIAGNOSIS — Z471 Aftercare following joint replacement surgery: Secondary | ICD-10-CM | POA: Diagnosis not present

## 2017-09-23 DIAGNOSIS — M87051 Idiopathic aseptic necrosis of right femur: Secondary | ICD-10-CM

## 2017-09-23 DIAGNOSIS — Z419 Encounter for procedure for purposes other than remedying health state, unspecified: Secondary | ICD-10-CM

## 2017-09-23 DIAGNOSIS — I739 Peripheral vascular disease, unspecified: Secondary | ICD-10-CM | POA: Diagnosis present

## 2017-09-23 DIAGNOSIS — M87851 Other osteonecrosis, right femur: Secondary | ICD-10-CM | POA: Diagnosis not present

## 2017-09-23 HISTORY — PX: TOTAL HIP ARTHROPLASTY: SHX124

## 2017-09-23 LAB — ABO/RH: ABO/RH(D): O POS

## 2017-09-23 LAB — URINALYSIS, ROUTINE W REFLEX MICROSCOPIC
BILIRUBIN URINE: NEGATIVE
Glucose, UA: NEGATIVE mg/dL
HGB URINE DIPSTICK: NEGATIVE
Ketones, ur: NEGATIVE mg/dL
Leukocytes, UA: NEGATIVE
Nitrite: NEGATIVE
PROTEIN: NEGATIVE mg/dL
Specific Gravity, Urine: 1.025 (ref 1.005–1.030)
pH: 6 (ref 5.0–8.0)

## 2017-09-23 LAB — TYPE AND SCREEN
ABO/RH(D): O POS
Antibody Screen: NEGATIVE

## 2017-09-23 SURGERY — ARTHROPLASTY, HIP, TOTAL, ANTERIOR APPROACH
Anesthesia: General | Site: Hip | Laterality: Right

## 2017-09-23 MED ORDER — BUPIVACAINE HCL (PF) 0.25 % IJ SOLN
INTRAMUSCULAR | Status: AC
  Filled 2017-09-23: qty 20

## 2017-09-23 MED ORDER — CYCLOBENZAPRINE HCL 10 MG PO TABS
10.0000 mg | ORAL_TABLET | Freq: Two times a day (BID) | ORAL | Status: DC | PRN
  Administered 2017-09-23: 10 mg via ORAL

## 2017-09-23 MED ORDER — ROCURONIUM BROMIDE 10 MG/ML (PF) SYRINGE
PREFILLED_SYRINGE | INTRAVENOUS | Status: AC
  Filled 2017-09-23: qty 5

## 2017-09-23 MED ORDER — CLONIDINE HCL 0.2 MG PO TABS
0.2000 mg | ORAL_TABLET | Freq: Two times a day (BID) | ORAL | Status: DC
  Administered 2017-09-23 – 2017-09-24 (×2): 0.2 mg via ORAL
  Filled 2017-09-23 (×2): qty 1

## 2017-09-23 MED ORDER — TICAGRELOR 90 MG PO TABS: 45.0000 mg | ORAL_TABLET | ORAL | Status: DC

## 2017-09-23 MED ORDER — OXYCODONE HCL 5 MG PO TABS
ORAL_TABLET | ORAL | Status: AC
  Filled 2017-09-23: qty 1

## 2017-09-23 MED ORDER — OXYCODONE HCL 5 MG/5ML PO SOLN: 5.0000 mg | Freq: Once | ORAL | Status: AC | PRN

## 2017-09-23 MED ORDER — ALBUTEROL SULFATE (2.5 MG/3ML) 0.083% IN NEBU: 2.5000 mg | INHALATION_SOLUTION | Freq: Four times a day (QID) | RESPIRATORY_TRACT | Status: DC | PRN

## 2017-09-23 MED ORDER — ROFLUMILAST 500 MCG PO TABS
500.0000 ug | ORAL_TABLET | Freq: Every day | ORAL | Status: DC
  Administered 2017-09-24: 500 ug via ORAL
  Filled 2017-09-23 (×2): qty 1

## 2017-09-23 MED ORDER — MENTHOL 3 MG MT LOZG: 1.0000 | LOZENGE | OROMUCOSAL | Status: DC | PRN

## 2017-09-23 MED ORDER — ROFLUMILAST 500 MCG PO TABS: 500.0000 ug | ORAL_TABLET | Freq: Every day | ORAL | Status: DC

## 2017-09-23 MED ORDER — AMLODIPINE BESYLATE 10 MG PO TABS
10.0000 mg | ORAL_TABLET | Freq: Every day | ORAL | Status: DC
Start: 2017-09-23 — End: 2017-09-23

## 2017-09-23 MED ORDER — ONDANSETRON HCL 4 MG/2ML IJ SOLN
INTRAMUSCULAR | Status: AC
  Filled 2017-09-23: qty 2

## 2017-09-23 MED ORDER — ALPRAZOLAM 0.25 MG PO TABS: 0.2500 mg | ORAL_TABLET | Freq: Every day | ORAL | Status: DC | PRN

## 2017-09-23 MED ORDER — LIDOCAINE 2% (20 MG/ML) 5 ML SYRINGE
INTRAMUSCULAR | Status: AC
  Filled 2017-09-23: qty 5

## 2017-09-23 MED ORDER — MORPHINE SULFATE (PF) 4 MG/ML IV SOLN
4.0000 mg | INTRAVENOUS | Status: DC | PRN
  Administered 2017-09-23 – 2017-09-24 (×3): 4 mg via INTRAVENOUS
  Filled 2017-09-23 (×3): qty 1

## 2017-09-23 MED ORDER — LIDOCAINE 2% (20 MG/ML) 5 ML SYRINGE
INTRAMUSCULAR | Status: DC | PRN
  Administered 2017-09-23: 60 mg via INTRAVENOUS

## 2017-09-23 MED ORDER — FAMOTIDINE 20 MG PO TABS: 20.0000 mg | ORAL_TABLET | Freq: Every day | ORAL | Status: DC

## 2017-09-23 MED ORDER — FAMOTIDINE 20 MG PO TABS
20.0000 mg | ORAL_TABLET | Freq: Every day | ORAL | Status: DC
  Administered 2017-09-23: 20 mg via ORAL
  Filled 2017-09-23: qty 1

## 2017-09-23 MED ORDER — FENTANYL CITRATE (PF) 100 MCG/2ML IJ SOLN
25.0000 ug | INTRAMUSCULAR | Status: DC | PRN
  Administered 2017-09-23 (×2): 25 ug via INTRAVENOUS

## 2017-09-23 MED ORDER — SUGAMMADEX SODIUM 200 MG/2ML IV SOLN
INTRAVENOUS | Status: DC | PRN
  Administered 2017-09-23: 100 mg via INTRAVENOUS

## 2017-09-23 MED ORDER — ONDANSETRON HCL 4 MG/2ML IJ SOLN
INTRAMUSCULAR | Status: DC | PRN
  Administered 2017-09-23: 4 mg via INTRAVENOUS

## 2017-09-23 MED ORDER — FENOFIBRATE 54 MG PO TABS
54.0000 mg | ORAL_TABLET | Freq: Every day | ORAL | Status: DC
  Administered 2017-09-23 – 2017-09-24 (×2): 54 mg via ORAL
  Filled 2017-09-23 (×3): qty 1

## 2017-09-23 MED ORDER — CYCLOBENZAPRINE HCL 10 MG PO TABS
10.0000 mg | ORAL_TABLET | Freq: Two times a day (BID) | ORAL | Status: DC | PRN
  Administered 2017-09-24 (×2): 10 mg via ORAL
  Filled 2017-09-23 (×2): qty 1

## 2017-09-23 MED ORDER — ASPIRIN EC 325 MG PO TBEC
325.0000 mg | DELAYED_RELEASE_TABLET | Freq: Every day | ORAL | Status: DC
  Administered 2017-09-23 – 2017-09-24 (×2): 325 mg via ORAL
  Filled 2017-09-23 (×2): qty 1

## 2017-09-23 MED ORDER — THROMBIN 20000 UNITS EX KIT
PACK | CUTANEOUS | Status: AC
  Filled 2017-09-23: qty 1

## 2017-09-23 MED ORDER — AMLODIPINE BESYLATE 10 MG PO TABS
10.0000 mg | ORAL_TABLET | Freq: Every day | ORAL | Status: DC
  Administered 2017-09-23 – 2017-09-24 (×2): 10 mg via ORAL
  Filled 2017-09-23 (×2): qty 1

## 2017-09-23 MED ORDER — OXYCODONE HCL 5 MG PO TABS
10.0000 mg | ORAL_TABLET | ORAL | Status: DC | PRN
  Administered 2017-09-23 – 2017-09-24 (×6): 10 mg via ORAL
  Filled 2017-09-23 (×5): qty 2

## 2017-09-23 MED ORDER — THROMBIN 20000 UNITS EX KIT
PACK | CUTANEOUS | Status: DC | PRN
  Administered 2017-09-23: 20000 [IU] via TOPICAL

## 2017-09-23 MED ORDER — METOCLOPRAMIDE HCL 5 MG PO TABS: 5.0000 mg | ORAL_TABLET | Freq: Three times a day (TID) | ORAL | Status: DC | PRN

## 2017-09-23 MED ORDER — KETOROLAC TROMETHAMINE 30 MG/ML IJ SOLN
INTRAMUSCULAR | Status: AC
  Filled 2017-09-23: qty 1

## 2017-09-23 MED ORDER — ALBUTEROL SULFATE (2.5 MG/3ML) 0.083% IN NEBU: 2.5000 mg | INHALATION_SOLUTION | RESPIRATORY_TRACT | Status: DC | PRN

## 2017-09-23 MED ORDER — MORPHINE SULFATE (PF) 4 MG/ML IV SOLN
INTRAVENOUS | Status: AC
  Filled 2017-09-23: qty 2

## 2017-09-23 MED ORDER — PROPOFOL 10 MG/ML IV BOLUS
INTRAVENOUS | Status: AC
  Filled 2017-09-23: qty 20

## 2017-09-23 MED ORDER — ALBUTEROL SULFATE (2.5 MG/3ML) 0.083% IN NEBU
2.5000 mg | INHALATION_SOLUTION | Freq: Four times a day (QID) | RESPIRATORY_TRACT | Status: DC
  Administered 2017-09-23: 2.5 mg via RESPIRATORY_TRACT
  Filled 2017-09-23 (×2): qty 3

## 2017-09-23 MED ORDER — ACETAMINOPHEN 325 MG PO TABS
650.0000 mg | ORAL_TABLET | ORAL | Status: DC | PRN
Start: 2017-09-23 — End: 2017-09-24

## 2017-09-23 MED ORDER — ONDANSETRON HCL 4 MG/2ML IJ SOLN: 4.0000 mg | Freq: Four times a day (QID) | INTRAMUSCULAR | Status: DC | PRN

## 2017-09-23 MED ORDER — DEXAMETHASONE SODIUM PHOSPHATE 10 MG/ML IJ SOLN
INTRAMUSCULAR | Status: DC | PRN
  Administered 2017-09-23: 5 mg via INTRAVENOUS

## 2017-09-23 MED ORDER — ASPIRIN EC 325 MG PO TBEC: 325.0000 mg | DELAYED_RELEASE_TABLET | Freq: Every day | ORAL | Status: DC

## 2017-09-23 MED ORDER — CLONIDINE HCL 0.2 MG PO TABS: 0.2000 mg | ORAL_TABLET | Freq: Two times a day (BID) | ORAL | Status: DC

## 2017-09-23 MED ORDER — BUPIVACAINE LIPOSOME 1.3 % IJ SUSP
20.0000 mL | Freq: Once | INTRAMUSCULAR | Status: DC
  Filled 2017-09-23: qty 20

## 2017-09-23 MED ORDER — CLONIDINE HCL (ANALGESIA) 100 MCG/ML EP SOLN: 150.0000 ug | Freq: Once | EPIDURAL | Status: DC

## 2017-09-23 MED ORDER — FENOFIBRATE 54 MG PO TABS: 54.0000 mg | ORAL_TABLET | Freq: Every day | ORAL | Status: DC

## 2017-09-23 MED ORDER — OXYCODONE HCL 5 MG PO TABS
ORAL_TABLET | ORAL | Status: AC
  Administered 2017-09-23: 10 mg via ORAL
  Filled 2017-09-23: qty 2

## 2017-09-23 MED ORDER — LACTATED RINGERS IV SOLN
INTRAVENOUS | Status: DC
  Administered 2017-09-23: 11:00:00 via INTRAVENOUS

## 2017-09-23 MED ORDER — TICAGRELOR 90 MG PO TABS
45.0000 mg | ORAL_TABLET | Freq: Every day | ORAL | Status: DC
  Filled 2017-09-23: qty 1

## 2017-09-23 MED ORDER — CEFAZOLIN SODIUM-DEXTROSE 2-4 GM/100ML-% IV SOLN
2.0000 g | INTRAVENOUS | Status: AC
  Administered 2017-09-23: 2 g via INTRAVENOUS
  Filled 2017-09-23: qty 100

## 2017-09-23 MED ORDER — SUGAMMADEX SODIUM 200 MG/2ML IV SOLN
INTRAVENOUS | Status: AC
  Filled 2017-09-23: qty 4

## 2017-09-23 MED ORDER — CLONIDINE HCL (ANALGESIA) 100 MCG/ML EP SOLN
EPIDURAL | Status: DC | PRN
  Administered 2017-09-23: 1 mL

## 2017-09-23 MED ORDER — PHENOL 1.4 % MT LIQD: 1.0000 | OROMUCOSAL | Status: DC | PRN

## 2017-09-23 MED ORDER — ALBUTEROL SULFATE HFA 108 (90 BASE) MCG/ACT IN AERS: 2.0000 | INHALATION_SPRAY | RESPIRATORY_TRACT | Status: DC | PRN

## 2017-09-23 MED ORDER — PHENYLEPHRINE HCL 10 MG/ML IJ SOLN
INTRAVENOUS | Status: DC | PRN
  Administered 2017-09-23: 10 ug/min via INTRAVENOUS

## 2017-09-23 MED ORDER — FENTANYL CITRATE (PF) 250 MCG/5ML IJ SOLN
INTRAMUSCULAR | Status: AC
  Filled 2017-09-23: qty 5

## 2017-09-23 MED ORDER — ACETAMINOPHEN 650 MG RE SUPP: 650.0000 mg | RECTAL | Status: DC | PRN

## 2017-09-23 MED ORDER — METOCLOPRAMIDE HCL 5 MG/ML IJ SOLN: 5.0000 mg | Freq: Three times a day (TID) | INTRAMUSCULAR | Status: DC | PRN

## 2017-09-23 MED ORDER — CEFAZOLIN SODIUM-DEXTROSE 2-4 GM/100ML-% IV SOLN
2.0000 g | Freq: Four times a day (QID) | INTRAVENOUS | Status: AC
  Administered 2017-09-23 – 2017-09-24 (×2): 2 g via INTRAVENOUS
  Filled 2017-09-23 (×2): qty 100

## 2017-09-23 MED ORDER — ONDANSETRON HCL 4 MG PO TABS: 4.0000 mg | ORAL_TABLET | Freq: Four times a day (QID) | ORAL | Status: DC | PRN

## 2017-09-23 MED ORDER — ACETAMINOPHEN 500 MG PO TABS: 1000.0000 mg | ORAL_TABLET | Freq: Every day | ORAL | Status: DC | PRN

## 2017-09-23 MED ORDER — BUDESONIDE 0.25 MG/2ML IN SUSP
0.2500 mg | Freq: Two times a day (BID) | RESPIRATORY_TRACT | Status: DC
  Administered 2017-09-23 – 2017-09-24 (×2): 0.25 mg via RESPIRATORY_TRACT
  Filled 2017-09-23 (×3): qty 2

## 2017-09-23 MED ORDER — PROPOFOL 10 MG/ML IV BOLUS
INTRAVENOUS | Status: DC | PRN
  Administered 2017-09-23: 150 mg via INTRAVENOUS

## 2017-09-23 MED ORDER — CYCLOBENZAPRINE HCL 10 MG PO TABS
ORAL_TABLET | ORAL | Status: AC
  Administered 2017-09-23: 10 mg via ORAL
  Filled 2017-09-23: qty 1

## 2017-09-23 MED ORDER — MIDAZOLAM HCL 2 MG/2ML IJ SOLN
INTRAMUSCULAR | Status: AC
  Filled 2017-09-23: qty 2

## 2017-09-23 MED ORDER — FENTANYL CITRATE (PF) 100 MCG/2ML IJ SOLN
INTRAMUSCULAR | Status: AC
  Filled 2017-09-23: qty 2

## 2017-09-23 MED ORDER — TICAGRELOR 90 MG PO TABS
90.0000 mg | ORAL_TABLET | Freq: Every morning | ORAL | Status: DC
  Administered 2017-09-24: 90 mg via ORAL
  Filled 2017-09-23: qty 1

## 2017-09-23 MED ORDER — CHLORHEXIDINE GLUCONATE 4 % EX LIQD: 60.0000 mL | Freq: Once | CUTANEOUS | Status: DC

## 2017-09-23 MED ORDER — TRANEXAMIC ACID 1000 MG/10ML IV SOLN
2000.0000 mg | Freq: Once | INTRAVENOUS | Status: DC
  Filled 2017-09-23: qty 20

## 2017-09-23 MED ORDER — OXYCODONE HCL 5 MG PO TABS
5.0000 mg | ORAL_TABLET | Freq: Once | ORAL | Status: AC | PRN
  Administered 2017-09-23: 5 mg via ORAL

## 2017-09-23 MED ORDER — TIOTROPIUM BROMIDE MONOHYDRATE 18 MCG IN CAPS: 18.0000 ug | ORAL_CAPSULE | Freq: Every day | RESPIRATORY_TRACT | Status: DC

## 2017-09-23 MED ORDER — ROCURONIUM BROMIDE 10 MG/ML (PF) SYRINGE
PREFILLED_SYRINGE | INTRAVENOUS | Status: DC | PRN
  Administered 2017-09-23: 60 mg via INTRAVENOUS
  Administered 2017-09-23 (×3): 20 mg via INTRAVENOUS

## 2017-09-23 MED ORDER — MIDAZOLAM HCL 2 MG/2ML IJ SOLN
INTRAMUSCULAR | Status: DC | PRN
  Administered 2017-09-23 (×2): 1 mg via INTRAVENOUS

## 2017-09-23 MED ORDER — BUPIVACAINE-EPINEPHRINE 0.25% -1:200000 IJ SOLN
INTRAMUSCULAR | Status: AC
  Filled 2017-09-23: qty 1

## 2017-09-23 MED ORDER — FENTANYL CITRATE (PF) 250 MCG/5ML IJ SOLN
INTRAMUSCULAR | Status: DC | PRN
  Administered 2017-09-23 (×2): 50 ug via INTRAVENOUS
  Administered 2017-09-23: 100 ug via INTRAVENOUS
  Administered 2017-09-23: 50 ug via INTRAVENOUS
  Administered 2017-09-23: 100 ug via INTRAVENOUS

## 2017-09-23 MED ORDER — TIOTROPIUM BROMIDE MONOHYDRATE 18 MCG IN CAPS
18.0000 ug | ORAL_CAPSULE | Freq: Every day | RESPIRATORY_TRACT | Status: DC
  Administered 2017-09-24: 18 ug via RESPIRATORY_TRACT
  Filled 2017-09-23: qty 5

## 2017-09-23 MED ORDER — DEXAMETHASONE SODIUM PHOSPHATE 10 MG/ML IJ SOLN
INTRAMUSCULAR | Status: AC
  Filled 2017-09-23: qty 1

## 2017-09-23 MED ORDER — ARTIFICIAL TEARS OPHTHALMIC OINT
TOPICAL_OINTMENT | OPHTHALMIC | Status: AC
  Filled 2017-09-23: qty 3.5

## 2017-09-23 MED ORDER — SODIUM CHLORIDE 0.9 % IR SOLN
Status: DC | PRN
  Administered 2017-09-23: 1

## 2017-09-23 SURGICAL SUPPLY — 49 items
BAG BILE T-TUBES STRL (MISCELLANEOUS) ×3 IMPLANT
BAG DECANTER FOR FLEXI CONT (MISCELLANEOUS) ×3 IMPLANT
BLADE CLIPPER SURG (BLADE) ×3 IMPLANT
BLADE SAW SGTL 18X1.27X75 (BLADE) ×2 IMPLANT
BLADE SAW SGTL 18X1.27X75MM (BLADE) ×1
CAPT HIP TOTAL 2 ×3 IMPLANT
CELLS DAT CNTRL 66122 CELL SVR (MISCELLANEOUS) ×1 IMPLANT
CLOSURE WOUND 1/2 X4 (GAUZE/BANDAGES/DRESSINGS) ×1
COVER PERINEAL POST (MISCELLANEOUS) ×3 IMPLANT
COVER SURGICAL LIGHT HANDLE (MISCELLANEOUS) ×3 IMPLANT
DRAPE C-ARM 42X72 X-RAY (DRAPES) ×3 IMPLANT
DRAPE STERI IOBAN 125X83 (DRAPES) ×3 IMPLANT
DRAPE U-SHAPE 47X51 STRL (DRAPES) ×9 IMPLANT
DRSG AQUACEL AG ADV 3.5X10 (GAUZE/BANDAGES/DRESSINGS) ×3 IMPLANT
DURAPREP 26ML APPLICATOR (WOUND CARE) ×3 IMPLANT
ELECT BLADE 4.0 EZ CLEAN MEGAD (MISCELLANEOUS) ×3
ELECT REM PT RETURN 9FT ADLT (ELECTROSURGICAL) ×3
ELECTRODE BLDE 4.0 EZ CLN MEGD (MISCELLANEOUS) ×1 IMPLANT
ELECTRODE REM PT RTRN 9FT ADLT (ELECTROSURGICAL) ×1 IMPLANT
GLOVE BIOGEL PI IND STRL 8 (GLOVE) ×1 IMPLANT
GLOVE BIOGEL PI INDICATOR 8 (GLOVE) ×2
GLOVE BIOGEL PI ORTHO PRO SZ8 (GLOVE) ×4
GLOVE PI ORTHO PRO STRL SZ8 (GLOVE) ×2 IMPLANT
GOWN STRL REUS W/ TWL LRG LVL3 (GOWN DISPOSABLE) ×3 IMPLANT
GOWN STRL REUS W/TWL LRG LVL3 (GOWN DISPOSABLE) ×6
HANDPIECE INTERPULSE COAX TIP (DISPOSABLE) ×2
HOOD PEEL AWAY FLYTE STAYCOOL (MISCELLANEOUS) ×9 IMPLANT
KIT BASIN OR (CUSTOM PROCEDURE TRAY) ×3 IMPLANT
KIT ROOM TURNOVER OR (KITS) ×3 IMPLANT
NEEDLE HYPO 22GX1.5 SAFETY (NEEDLE) ×3 IMPLANT
NS IRRIG 1000ML POUR BTL (IV SOLUTION) ×3 IMPLANT
PACK TOTAL JOINT (CUSTOM PROCEDURE TRAY) ×3 IMPLANT
PAD ARMBOARD 7.5X6 YLW CONV (MISCELLANEOUS) ×3 IMPLANT
RTRCTR WOUND ALEXIS 18CM MED (MISCELLANEOUS) ×3
SET HNDPC FAN SPRY TIP SCT (DISPOSABLE) ×1 IMPLANT
STRIP CLOSURE SKIN 1/2X4 (GAUZE/BANDAGES/DRESSINGS) ×2 IMPLANT
SUT ETHIBOND NAB CT1 #1 30IN (SUTURE) ×6 IMPLANT
SUT MNCRL AB 3-0 PS2 18 (SUTURE) ×3 IMPLANT
SUT VIC AB 0 CT1 27 (SUTURE) ×8
SUT VIC AB 0 CT1 27XBRD ANBCTR (SUTURE) ×4 IMPLANT
SUT VIC AB 1 CT1 27 (SUTURE) ×8
SUT VIC AB 1 CT1 27XBRD ANBCTR (SUTURE) ×4 IMPLANT
SUT VIC AB 2-0 CT1 27 (SUTURE) ×4
SUT VIC AB 2-0 CT1 TAPERPNT 27 (SUTURE) ×2 IMPLANT
SYR 30ML LL (SYRINGE) ×3 IMPLANT
TOWEL OR 17X24 6PK STRL BLUE (TOWEL DISPOSABLE) ×3 IMPLANT
TOWEL OR 17X26 10 PK STRL BLUE (TOWEL DISPOSABLE) ×3 IMPLANT
TRAY FOLEY W/METER SILVER 16FR (SET/KITS/TRAYS/PACK) ×3 IMPLANT
WATER STERILE IRR 1000ML POUR (IV SOLUTION) ×3 IMPLANT

## 2017-09-23 NOTE — Anesthesia Preprocedure Evaluation (Addendum)
Anesthesia Evaluation  Patient identified by MRN, date of birth, ID band Patient awake    Reviewed: Allergy & Precautions, H&P , NPO status , Patient's Chart, lab work & pertinent test results  Airway Mallampati: II   Neck ROM: full    Dental   Pulmonary asthma , COPD,  COPD inhaler, Current Smoker,    breath sounds clear to auscultation       Cardiovascular hypertension, + Peripheral Vascular Disease   Rhythm:regular Rate:Normal     Neuro/Psych PSYCHIATRIC DISORDERS Depression CVA, No Residual Symptoms    GI/Hepatic GERD  ,  Endo/Other    Renal/GU      Musculoskeletal  (+) Arthritis , Rheumatoid disorders,    Abdominal   Peds  Hematology   Anesthesia Other Findings   Reproductive/Obstetrics                            Anesthesia Physical Anesthesia Plan  ASA: III  Anesthesia Plan: General   Post-op Pain Management:    Induction: Intravenous  PONV Risk Score and Plan: 2 and Treatment may vary due to age or medical condition, Ondansetron, Dexamethasone and Midazolam  Airway Management Planned: Oral ETT  Additional Equipment:   Intra-op Plan:   Post-operative Plan: Extubation in OR  Informed Consent: I have reviewed the patients History and Physical, chart, labs and discussed the procedure including the risks, benefits and alternatives for the proposed anesthesia with the patient or authorized representative who has indicated his/her understanding and acceptance.   Dental advisory given  Plan Discussed with: CRNA, Anesthesiologist and Surgeon  Anesthesia Plan Comments:        Anesthesia Quick Evaluation

## 2017-09-23 NOTE — Transfer of Care (Signed)
Immediate Anesthesia Transfer of Care Note  Patient: Derek Conner  Procedure(s) Performed: RIGHT TOTAL HIP ARTHROPLASTY ANTERIOR APPROACH (Right Hip)  Patient Location: PACU  Anesthesia Type:General  Level of Consciousness: awake, alert , oriented and patient cooperative  Airway & Oxygen Therapy: Patient Spontanous Breathing and Patient connected to nasal cannula oxygen  Post-op Assessment: Report given to RN, Post -op Vital signs reviewed and stable and Patient moving all extremities X 4  Post vital signs: Reviewed and stable  Last Vitals:  Vitals:   09/23/17 0951  BP: 137/83  Pulse: 97  Resp: 20  Temp: 36.6 C  SpO2: 97%    Last Pain:  Vitals:   09/23/17 1020  TempSrc:   PainSc: 4       Patients Stated Pain Goal: 5 (09/23/17 1020)  Complications: No apparent anesthesia complications

## 2017-09-23 NOTE — Anesthesia Procedure Notes (Signed)
Procedure Name: Intubation Date/Time: 09/23/2017 12:36 PM Performed by: Julieta Bellini, CRNA Pre-anesthesia Checklist: Patient identified, Emergency Drugs available, Suction available and Patient being monitored Patient Re-evaluated:Patient Re-evaluated prior to induction Oxygen Delivery Method: Circle system utilized Preoxygenation: Pre-oxygenation with 100% oxygen Induction Type: IV induction Ventilation: Mask ventilation without difficulty Laryngoscope Size: Mac and 4 Grade View: Grade I Tube type: Oral Tube size: 7.5 mm Number of attempts: 1 Airway Equipment and Method: Stylet Placement Confirmation: ETT inserted through vocal cords under direct vision,  positive ETCO2 and breath sounds checked- equal and bilateral Secured at: 24 cm Tube secured with: Tape Dental Injury: Teeth and Oropharynx as per pre-operative assessment

## 2017-09-23 NOTE — Brief Op Note (Signed)
09/23/2017  3:29 PM  PATIENT:  [name]  57 y.o. male  PRE-OPERATIVE DIAGNOSIS:  right hip avascular necrosis  POST-OPERATIVE DIAGNOSIS:  right hip avascular necrosis  PROCEDURE:  Procedure(s): RIGHT TOTAL HIP ARTHROPLASTY ANTERIOR APPROACH  SURGEON:  Surgeon(s): August Saucer, Corrie Mckusick, MD  ASSISTANT: Patrick Jupiter rnfa  ANESTHESIA:   general  EBL: 250 ml    Total I/O In: 1000 [I.V.:1000] Out: 250 [Blood:250]  BLOOD ADMINISTERED: none  DRAINS: none   LOCAL MEDICATIONS USED: marcaine mso4 clonidine exparel  SPECIMEN:  No Specimen  COUNTS:  YES  TOURNIQUET:  * No tourniquets in log *  DICTATION: .Other Dictation: Dictation Number [id number]  PLAN OF CARE: Admit to inpatient   PATIENT DISPOSITION:  PACU - hemodynamically stable

## 2017-09-23 NOTE — H&P (Signed)
TOTAL HIP ADMISSION H&P  Patient is admitted for right total hip arthroplasty.  Subjective:  Chief Complaint: right hip pain  HPI: Derek Conner, 57 y.o. male, has a history of pain and functional disability in the right hip(s) due to arthritis and patient has failed non-surgical conservative treatments for greater than 12 weeks to include use of assistive devices and activity modification.  Onset of symptoms was abrupt starting 1 years ago with rapidlly worsening course since that time.The patient noted no past surgery on the right hip(s).  Patient currently rates pain in the right hip at 9 out of 10 with activity. Patient has night pain, worsening of pain with activity and weight bearing, trendelenberg gait, pain that interfers with activities of daily living and pain with passive range of motion. Patient has evidence of joint space narrowing and Avascular necrosis by imaging studies. This condition presents safety issues increasing the risk of falls. 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  L.  There is no current active infection.  Patient Active Problem List   Diagnosis Date Noted  . Nocturnal hypoxemia 04/03/2017  . Fatigue 01/09/2017  . Thrush 12/12/2016  . Brain aneurysm 03/27/2016  . Preop respiratory exam 03/26/2016  . Aneurysm (HCC)   . Chronic obstructive pulmonary disease with acute exacerbation (HCC) 01/19/2016  . Community acquired pneumonia 01/19/2016  . Cough syncope 01/19/2016  . Wellness examination 08/29/2015  . Hyperglycemia 01/06/2015  . HTN (hypertension) 12/09/2014  . COPD exacerbation (HCC) 07/08/2014  . Atypical chest pain 07/08/2014  . 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 Dry mouth 12/26/2013  . Cigarette smoker 12/26/2013  . Nodule of left lung 12/26/2013  . COPD, severity to be determined (HCC) 12/26/2013  . Pulmonary nodule, right  12/06/2013  . Hemoptysis 12/03/2013  . COPD (chronic obstructive pulmonary disease) (HCC) 12/03/2013  . Acute bronchitis 12/03/2013  . Tobacco use disorder 12/03/2013   Past Medical History:  Diagnosis Date  . Asthma   . CAP (community acquired pneumonia) 01/19/2016  . Chronic bronchitis (HCC)   . COPD (chronic obstructive pulmonary disease) (HCC)   . Emphysema lung (HCC)   . Emphysema of lung (HCC)   . GERD (gastroesophageal reflux disease)   . Hepatitis A infection ~ 1980  . High cholesterol   . Hypertension   . Pneumonia 1960s  . Rheumatoid arthritis (HCC)    "hands mostly"  ( 01/19/2016)  . Situational depression Dec 14, 2009   "only when my mother died"  . Stroke [facility])    mini stroke when doing embolization, 15-Dec-2015, no deficits    Past Surgical History:  Procedure Laterality Date  . APPENDECTOMY  1980's  . CHEST TUBE INSERTION Left 1990's   "lung collapsed"  . CYSTECTOMY Left 1960's   "wrist"  . FOOT FRACTURE SURGERY Left ~ 2003-12-15   "it was crushed"  . IR GENERIC HISTORICAL  03/27/2016   IR ANGIOGRAM FOLLOW UP STUDY 03/27/2016 Julieanne Cotton, MD [facility]  . IR GENERIC HISTORICAL  03/27/2016   IR ANGIOGRAM FOLLOW UP STUDY 03/27/2016 Julieanne Cotton, MD [facility]  . IR GENERIC HISTORICAL  03/27/2016   IR ANGIO INTRA EXTRACRAN SEL COM CAROTID INNOMINATE UNI L MOD SED 03/27/2016 Julieanne Cotton, MD [facility]  . IR GENERIC HISTORICAL  03/27/2016   IR ANGIOGRAM FOLLOW UP STUDY 03/27/2016 Julieanne Cotton, MD [facility]  . IR GENERIC HISTORICAL  03/27/2016  IR ANGIO VERTEBRAL SEL SUBCLAVIAN INNOMINATE UNI R MOD SED 03/27/2016 Julieanne Cotton, MD [facility]  . IR GENERIC HISTORICAL  03/27/2016   IR 3D INDEPENDENT WKST 03/27/2016 Julieanne Cotton, MD [facility]  . IR GENERIC HISTORICAL  03/27/2016   IR ANGIOGRAM FOLLOW UP STUDY 03/27/2016 Julieanne Cotton, MD [facility]  . IR GENERIC HISTORICAL  03/27/2016   IR NEURO EACH ADD'L AFTER BASIC UNI RIGHT (MS)  03/27/2016 Julieanne Cotton, MD [facility]  . IR GENERIC HISTORICAL  03/27/2016   IR ANGIOGRAM FOLLOW UP STUDY 03/27/2016 Julieanne Cotton, MD [facility]  . IR GENERIC HISTORICAL  03/27/2016   IR TRANSCATH/EMBOLIZ 03/27/2016 Julieanne Cotton, MD [facility]  . IR GENERIC HISTORICAL  03/27/2016   IR ANGIO INTRA EXTRACRAN SEL INTERNAL CAROTID UNI R MOD SED 03/27/2016 Julieanne Cotton, MD [facility]  . IR GENERIC HISTORICAL  03/27/2016   IR ANGIOGRAM FOLLOW UP STUDY 03/27/2016 Julieanne Cotton, MD [facility]  . IR GENERIC HISTORICAL  04/12/2016   IR RADIOLOGIST EVAL & MGMT 04/12/2016 [facility]  . RADIOLOGY WITH ANESTHESIA N/A 02/29/2016   Procedure: EMBOLIZATION     (RADIOLOGY WITH ANESTHESIA);  Surgeon: Julieanne Cotton, MD;  Location: [facility] OR;  Service: Radiology;  Laterality: N/A;  . RADIOLOGY WITH ANESTHESIA N/A 03/27/2016   Procedure: Embolization;  Surgeon: Julieanne Cotton, MD;  Location: [facility] OR;  Service: Radiology;  Laterality: N/A;  . VIDEO BRONCHOSCOPY Bilateral 12/06/2013   Procedure: VIDEO BRONCHOSCOPY WITHOUT FLUORO;  Surgeon: Alyson Reedy, MD;  Location: [facility] ENDOSCOPY;  Service: Cardiopulmonary;  Laterality: Bilateral;    Current Facility-Administered Medications  Medication Dose Route Frequency Provider Last Rate Last Dose  . ceFAZolin (ANCEF) IVPB 2g/100 mL premix  2 g Intravenous On Call to OR Cammy Copa, MD      . chlorhexidine (HIBICLENS) 4 % liquid 4 application  60 mL Topical Once Cammy Copa, MD      . chlorhexidine (HIBICLENS) 4 % liquid 4 application  60 mL Topical Once Cammy Copa, MD      . lactated ringers infusion   Intravenous Continuous Achille Rich, MD 50 mL/hr at 09/23/17 1038     Facility-Administered Medications Ordered in Other Encounters  Medication Dose Route Frequency Provider Last Rate Last Dose  . 0.9 %  sodium chloride infusion   Intravenous Continuous Ralene Muskrat, PA-C      . aspirin EC tablet 325 mg  325 mg Oral  60 min Pre-Op Ralene Muskrat, PA-C      . niMODipine (NIMOTOP) capsule 0-60 mg  0-60 mg Oral 60 min Pre-Op Ralene Muskrat, PA-C       Allergies  Allergen Reactions  . Symbicort [Budesonide-Formoterol Fumarate] Shortness Of Breath, Nausea And Vomiting and Other (See Comments)    heachache  . Dulera [Mometasone Furo-Formoterol Fum] Other (See Comments)    Headache   . Humira [Adalimumab] Nausea And Vomiting    Headaches     Social History   Tobacco Use  . Smoking status: Current Every Day Smoker    Packs/day: 0.50    Years: 40.00    Pack years: 20.00    Types: Cigarettes  . Smokeless tobacco: Never Used  . Tobacco comment: smoking 1/2ppd as of 05/23/17  ep  Substance Use Topics  . Alcohol use: Yes    Comment: 3 drinks liquor per day    Family History  Problem Relation Age of Onset  . Cancer Mother   . Arthritis Mother   . Stroke Father   .  Hypertension Father   . Colon cancer Neg Hx      Review of Systems  Musculoskeletal: Positive for joint pain.  All other systems reviewed and are negative.   Objective:  Physical Exam  Constitutional: He appears well-developed.  HENT:  Head: Normocephalic.  Eyes: Pupils are equal, round, and reactive to light.  Neck: Normal range of motion.  Cardiovascular: Normal rate.  Respiratory: Effort normal.  Neurological: He is alert.  Skin: Skin is warm.  Psychiatric: He has a normal mood and affect.  Examination of the right hip demonstrates pain with range of motion.  Leg lengths approximately equal.  Ankle dorsiflex and plantar flexion is intact.  Hip flexion strength is good.  There is pain with internal/external rotation of the right side versus left.  No trochanteric tenderness is noted.  Vital signs in last 24 hours: Temp:  [97.9 F (36.6 C)] 97.9 F (36.6 C) (02/19 0951) Pulse Rate:  [97] 97 (02/19 0951) Resp:  [20] 20 (02/19 0951) BP: (137)/(83) 137/83 (02/19 0951) SpO2:  [97 %] 97 % (02/19 0951) Weight:  [184 lb (83.5  kg)] 184 lb (83.5 kg) (02/19 0951)  Labs:   Estimated body mass index is 24.95 kg/m as calculated from the following:   Height as of 09/15/17: 6' (1.829 m).   Weight as of this encounter: 184 lb (83.5 kg).   Imaging Review Plain radiographs demonstrate moderate degenerative joint disease of the right hip(s). The bone quality appears to be good for age and reported activity level.  Assessment/Plan:  End stage arthritis, right hip(s)  The patient history, physical examination, clinical judgement of the provider and imaging studies are consistent with end stage degenerative joint disease of the right hip(s) and total hip arthroplasty is deemed medically necessary. The treatment options including medical management, injection therapy, arthroscopy and arthroplasty were discussed at length. The risks and benefits of total hip arthroplasty were presented and reviewed. The risks due to aseptic loosening, infection, stiffness, dislocation/subluxation,  thromboembolic complications and other imponderables were discussed.  The patient acknowledged the explanation, agreed to proceed with the plan and consent was signed. Patient is being admitted for inpatient treatment for surgery, pain control, PT, OT, prophylactic antibiotics, VTE prophylaxis, progressive ambulation and ADL's and discharge planning.The patient is planning to be discharged home with home health services the patient has had an aneurysm coiled 2 years ago.  For that he has been placed on aspirin 325 mg/day.  This was recommended to continue throughout the perioperative period by his interventional radiologist.  He did have some TIAs during the coiling procedure.  He has no residual effects from this intervention.

## 2017-09-24 ENCOUNTER — Encounter (HOSPITAL_COMMUNITY): Payer: Self-pay | Admitting: General Practice

## 2017-09-24 ENCOUNTER — Other Ambulatory Visit: Payer: Self-pay

## 2017-09-24 LAB — URINE CULTURE: CULTURE: NO GROWTH

## 2017-09-24 NOTE — Anesthesia Postprocedure Evaluation (Signed)
Anesthesia Post Note  Patient: Briton Sellman  Procedure(s) Performed: RIGHT TOTAL HIP ARTHROPLASTY ANTERIOR APPROACH (Right Hip)     Patient location during evaluation: PACU Anesthesia Type: General Level of consciousness: awake and alert Pain management: pain level controlled Vital Signs Assessment: post-procedure vital signs reviewed and stable Respiratory status: spontaneous breathing, nonlabored ventilation, respiratory function stable and patient connected to nasal cannula oxygen Cardiovascular status: blood pressure returned to baseline and stable Postop Assessment: no apparent nausea or vomiting Anesthetic complications: no    Last Vitals:  Vitals:   09/24/17 0558 09/24/17 0905  BP: 136/75   Pulse: 88   Resp: 16   Temp: 37.1 C   SpO2: 93% 97%    Last Pain:  Vitals:   09/24/17 0903  TempSrc:   PainSc: 5                  Mechele Kittleson S

## 2017-09-24 NOTE — Care Management Note (Signed)
Case Management Note  Patient Details  Name: Derek Conner MRN: [id number] Date of Birth: 1961/06/29  Subjective/Objective:  57 yr old gentleman s/p right total hip arthroplasty.                Action/Plan: Case manager spoke with patient and his wife concerning Home Health and DME. Choice for Home Health agency was offered. Referral was called to Shon Millet, [facility] liaison. Patient will have family support at discharge.    Expected Discharge Date:  09/24/17               Expected Discharge Plan:  Home w Home Health Services  In-House Referral:     Discharge planning Services  CM Consult  Post Acute Care Choice:  Home Health, Durable Medical Equipment Choice offered to:  Patient, Spouse  DME Arranged:  3-N-1, Walker rolling DME Agency:  [employer]  HH Arranged:  PT HH Agency:  [facility]  Status of Service:  Completed, signed off  If discussed at Long Length of Stay Meetings, dates discussed:    Additional Comments:  Durenda Guthrie, RN 09/24/2017, 11:41 AM

## 2017-09-24 NOTE — Evaluation (Signed)
Physical Therapy Evaluation Patient Details Name: Derek Conner MRN: [id number] DOB: July 03, 1961 Today's Date: 09/24/2017   History of Present Illness  57 y.o. male s/p R THA anterior approach 2/19. PMH Includes: Stroke, RA, PNA, GERD, COPD, Asthma, HTN, Foot fx surgery.     Clinical Impression  Patient is s/p above surgery resulting in functional limitations due to the deficits listed below (see PT Problem List). PTA, patient was living in 1 story home with stairs to enter, mod I with mobility and ADLs utilizing crutches. Upon eval, pt presetns with post op pain and weakness that limit his mobility. Currently min guard level for OOB mobility and able to tolerate short distance ambulation in hallway. Will focus on stair training next visit and discuss safe return home today vs tomorrow with patient and family, who are agreeable to see how he is doing this afternoon as well.  Patient will benefit from skilled PT to increase their independence and safety with mobility to allow discharge to the venue listed below.       Follow Up Recommendations Home health PT;Supervision/Assistance - 24 hour    Equipment Recommendations  None recommended by PT    Recommendations for Other Services       Precautions / Restrictions Precautions Precautions: Fall Restrictions Weight Bearing Restrictions: Yes RLE Weight Bearing: Weight bearing as tolerated      Mobility  Bed Mobility Overal bed mobility: Needs Assistance Bed Mobility: Supine to Sit;Sit to Supine     Supine to sit: Min assist Sit to supine: Min assist   General bed mobility comments: min A with  surgical limb over EOB  Transfers Overall transfer level: Needs assistance Equipment used: Rolling walker (2 wheeled) Transfers: Sit to/from Stand Sit to Stand: Min guard         General transfer comment: min guard cues for hand palcement and safety with RW  Ambulation/Gait Ambulation/Gait assistance: Min  guard;Supervision Ambulation Distance (Feet): 75 Feet Assistive device: Rolling walker (2 wheeled) Gait Pattern/deviations: Step-through pattern;Step-to pattern;Antalgic Gait velocity: decreased   General Gait Details: intermittent step through gaits with cues, cues for heel strke and use of RW. pt became dizzy x1 which resolved after rest. SpO2=93% after activity.   Stairs            Wheelchair Mobility    Modified Rankin (Stroke Patients Only)       Balance Overall balance assessment: Needs assistance   Sitting balance-Leahy Scale: Good       Standing balance-Leahy Scale: Fair                               Pertinent Vitals/Pain      Home Living Family/patient expects to be discharged to:: Private residence Living Arrangements: Spouse/significant other Available Help at Discharge: Family;Available 24 hours/day   Home Access: Stairs to enter   Entrance Stairs-Number of Steps: 2  Home Layout: One level Home Equipment: Crutches      Prior Function Level of Independence: Independent with assistive device(s)               Hand Dominance        Extremity/Trunk Assessment   Upper Extremity Assessment Upper Extremity Assessment: Overall WFL for tasks assessed    Lower Extremity Assessment Lower Extremity Assessment: (RLE strength 3/5 post op, LLE strength 4-/5 gross)       Communication   Communication: No difficulties  Cognition Arousal/Alertness: Awake/alert Behavior During Therapy: [facility]  for tasks assessed/performed Overall Cognitive Status: Within Functional Limits for tasks assessed                                        General Comments      Exercises Total Joint Exercises Ankle Circles/Pumps: 20 reps Quad Sets: 10 reps Hip ABduction/ADduction: 10 reps Knee Flexion: 10 reps Marching in Standing: 10 reps Standing Hip Extension: 10 reps   Assessment/Plan    PT Assessment Patient needs continued PT  services  PT Problem List Decreased strength;Decreased range of motion;Decreased activity tolerance;Decreased balance;Decreased mobility;Pain;Decreased knowledge of use of DME       PT Treatment Interventions DME instruction;Gait training;Stair training;Functional mobility training;Therapeutic activities;Therapeutic exercise    PT Goals (Current goals can be found in the Care Plan section)  Acute Rehab PT Goals Patient Stated Goal: return home when ready PT Goal Formulation: With patient/family Time For Goal Achievement: 10/01/17 Potential to Achieve Goals: Good    Frequency 7X/week   Barriers to discharge        Co-evaluation               AM-PAC PT "6 Clicks" Daily Activity  Outcome Measure Difficulty turning over in bed (including adjusting bedclothes, sheets and blankets)?: Unable Difficulty moving from lying on back to sitting on the side of the bed? : Unable Difficulty sitting down on and standing up from a chair with arms (e.g., wheelchair, bedside commode, etc,.)?: Unable Help needed moving to and from a bed to chair (including a wheelchair)?: A Little Help needed walking in hospital room?: A Little Help needed climbing 3-5 steps with a railing? : A Little 6 Click Score: 12    End of Session Equipment Utilized During Treatment: Gait belt Activity Tolerance: Patient tolerated treatment well Patient left: in bed;with call bell/phone within reach;with family/visitor present Nurse Communication: Mobility status PT Visit Diagnosis: Unsteadiness on feet (R26.81);Other abnormalities of gait and mobility (R26.89);Muscle weakness (generalized) (M62.81);Pain Pain - Right/Left: Right Pain - part of body: Hip    Time: 1012-1058 PT Time Calculation (min) (ACUTE ONLY): 46 min   Charges:   PT Evaluation $PT Eval Low Complexity: 1 Low PT Treatments $Gait Training: 8-22 mins $Therapeutic Exercise: 8-22 mins   PT G Codes:       Etta Grandchild, PT, DPT Acute Rehab  Services Pager: [phone]    Etta Grandchild 09/24/2017, 11:21 AM

## 2017-09-24 NOTE — Op Note (Signed)
NAME:  Derek Conner, Derek Conner NO.:  MEDICAL RECORD NO.:  [id number]  LOCATION:                                 FACILITY:  PHYSICIAN:  Burnard Bunting, M.D.    DATE OF BIRTH:  Nov 11, 1960  DATE OF PROCEDURE: DATE OF DISCHARGE:                              OPERATIVE REPORT   PREOPERATIVE DIAGNOSIS:  Right hip avascular necrosis.  POSTOPERATIVE DIAGNOSIS:  Right hip avascular necrosis.  PROCEDURE:  Right total hip replacement, anterior approach, DePuy component, 15 Corail stem, 1.5 ceramic ball, +4 liner, 58 mm pinnacle press-fit cup with 1 screw.  SURGEON ATTENDING:  Burnard Bunting, MD.  ASSISTANT:  Patrick Jupiter, RNFA.  INDICATIONS:  Derek Conner is a 57 year old patient with severe right hip pain, who presents for operative management after explanation of risks and benefits.  The patient has avascular necrosis with collapse.  DESCRIPTION OF PROCEDURE:  The patient was brought to the operating room where general endotracheal anesthesia was induced, preop antibiotics were administered, time-out was called, the patient was placed on the Hana bed.  Right hip was prescrubbed with alcohol and Betadine, allowed to air dry, prepped with DuraPrep solution and draped in a sterile manner.  Derek Conner was used to cover the operative field.  An incision was made 2 cm posterior and distal to the anterior superior iliac crest. Skin and subcutaneous tissue were sharply divided.  The tensor fascia lata muscle was identified.  Fascia was incised.  The plane was then developed between the tensor fascia lata and the rectus.  Crossing circumflex vessels were coagulated.  Retractors placed on the superior and inferior aspect of the femoral neck.  The capsule was incised. Tagged.  Femoral neck cut was made under fluoroscopic guidance.  Head was removed.  It was sized appropriately.  The labrum was then excised along with the pulvinar.  Transverse ligament was also incised for exposure.  At  this time, the acetabulum was reamed in approximately 45 degrees of abduction and 15-20 degrees of anteversion.  It was reamed up to 57 mm and a 58-mm press-fit Pinnacle cup was placed with 1 screw placed.  Excellent fit was obtained and confirmed position was good under fluoroscopy.  At this time, attention was directed towards the femur.  Leg was externally rotated.  Femoral hook was placed.  [name] retractor and trochanteric retractors were placed.  The conjoined tendon was released.  This allowed for more external rotation.  At this time, broaching was performed up to a size 15.  With a 15 trial in place, the patient was placed with a 1.5-mm head.  Good position in the canal was confirmed.  Good fit of the femoral prosthesis was confirmed.  Very good stability was achieved along with equal leg lengths.  At this time, the trial component was removed and true components were placed with same stability and parameters maintained.  At this time, thorough irrigation was performed and the capsule was anesthetized with Exparel and Marcaine solution.  Tranexamic acid sponge placed and allowed to stay in the incision for 3 minutes.  A pouring irrigation was then utilized along with thrombin.  The patient was on aspirin preoperatively and will  continue postoperatively.  At this time, the capsule was closed using #1 Vicryl suture followed by closure of the tensor fascia lata fascia using #1 Vicryl suture 0 Vicryl suture on the skin and subcu was closed using 0 Vicryl suture, 2-0 Vicryl suture, and a 3-0 Monocryl.  Leg lengths approximately equal at the conclusion of the case.  The patient tolerated the procedure well without immediate complications, transferred to the recovery room in stable condition.     Burnard Bunting, M.D.     GSD/MEDQ  D:  09/23/2017  T:  09/23/2017  Job:  [id number]

## 2017-09-24 NOTE — Progress Notes (Signed)
Physical Therapy Treatment Patient Details Name: Derek Conner MRN: [id number] DOB: 20-Aug-1960 Today's Date: 09/24/2017    History of Present Illness 57 y.o. male s/p R THA anterior approach 2/19. PMH Includes: Stroke, RA, PNA, GERD, COPD, Asthma, HTN, Foot fx surgery.     PT Comments    Patient progressed well today. Session focused on stair training for safe return home today. Pt with min guard, educated wife who is RN how to how to position and guard to ensure safety. Pt and family agree that they are confident in return home tonight and will begin HHPT tomorrow. Feel this is appropriate at this time.      Follow Up Recommendations  Home health PT;Supervision/Assistance - 24 hour     Equipment Recommendations  Tub Bench.    Recommendations for Other Services       Precautions / Restrictions Precautions Precautions: Fall Restrictions Weight Bearing Restrictions: Yes RLE Weight Bearing: Weight bearing as tolerated    Mobility  Bed Mobility Overal bed mobility: Needs Assistance Bed Mobility: Supine to Sit;Sit to Supine     Supine to sit: Min assist Sit to supine: Min assist   General bed mobility comments: min A with  surgical limb over EOB  Transfers Overall transfer level: Needs assistance Equipment used: Rolling walker (2 wheeled) Transfers: Sit to/from Stand Sit to Stand: Min guard         General transfer comment: min guard cues for hand palcement and safety with RW  Ambulation/Gait Ambulation/Gait assistance: Min guard;Supervision Ambulation Distance (Feet): 75 Feet Assistive device: Rolling walker (2 wheeled) Gait Pattern/deviations: Step-through pattern;Step-to pattern;Antalgic Gait velocity: decreased   General Gait Details: pt more painful this evening, ambulating with supervision now and improved step length   Stairs Stairs: Yes   Stair Management: No rails;With walker Number of Stairs: 4 General stair comments: 4x1 platforms, cues  for sequencing with RW, education with family for guarding. Pt progressed to min guard.   Wheelchair Mobility    Modified Rankin (Stroke Patients Only)       Balance Overall balance assessment: Needs assistance   Sitting balance-Leahy Scale: Good       Standing balance-Leahy Scale: Fair                              Cognition Arousal/Alertness: Awake/alert Behavior During Therapy: WFL for tasks assessed/performed Overall Cognitive Status: Within Functional Limits for tasks assessed                                        Exercises      General Comments        Pertinent Vitals/Pain      Home Living                      Prior Function            PT Goals (current goals can now be found in the care plan section) Acute Rehab PT Goals Patient Stated Goal: return home when ready PT Goal Formulation: With patient/family Time For Goal Achievement: 10/01/17 Potential to Achieve Goals: Good Progress towards PT goals: Goals met/education completed, patient discharged from PT    Frequency    7X/week      PT Plan Equipment recommendations need to be updated    Co-evaluation  AM-PAC PT "6 Clicks" Daily Activity  Outcome Measure  Difficulty turning over in bed (including adjusting bedclothes, sheets and blankets)?: Unable Difficulty moving from lying on back to sitting on the side of the bed? : Unable Difficulty sitting down on and standing up from a chair with arms (e.g., wheelchair, bedside commode, etc,.)?: Unable Help needed moving to and from a bed to chair (including a wheelchair)?: A Little Help needed walking in hospital room?: A Little Help needed climbing 3-5 steps with a railing? : A Little 6 Click Score: 12    End of Session Equipment Utilized During Treatment: Gait belt Activity Tolerance: Patient tolerated treatment well Patient left: in bed;with call bell/phone within reach;with  family/visitor present Nurse Communication: Mobility status PT Visit Diagnosis: Unsteadiness on feet (R26.81);Other abnormalities of gait and mobility (R26.89);Muscle weakness (generalized) (M62.81);Pain Pain - Right/Left: Right Pain - part of body: Hip     Time: 8346-2194 PT Time Calculation (min) (ACUTE ONLY): 20 min  Charges:  $Gait Training: 8-22 mins                    G Codes:      Reinaldo Berber, PT, DPT Acute Rehab Services Pager: [phone]     Reinaldo Berber 09/24/2017, 5:10 PM

## 2017-09-24 NOTE — Progress Notes (Signed)
Subjective: Pt stable - has been ambulating - reports some burning   Objective: Vital signs in last 24 hours: Temp:  [97.3 F (36.3 C)-98.7 F (37.1 C)] 98.7 F (37.1 C) (02/20 0558) Pulse Rate:  [70-97] 88 (02/20 0558) Resp:  [10-20] 16 (02/20 0558) BP: (128-142)/(74-87) 136/75 (02/20 0558) SpO2:  [89 %-97 %] 93 % (02/20 0558) Weight:  [184 lb (83.5 kg)] 184 lb (83.5 kg) (02/19 0951)  Intake/Output from previous day: 02/19 0701 - 02/20 0700 In: 2780 [P.O.:480; I.V.:1800; IV Piggyback:100] Out: 1260 [Urine:1010; Blood:250] Intake/Output this shift: No intake/output data recorded.  Exam:  Intact pulses distally Dorsiflexion/Plantar flexion intact  Labs: No results for input(s): HGB in the last 72 hours. No results for input(s): WBC, RBC, HCT, PLT in the last 72 hours. No results for input(s): NA, K, CL, CO2, BUN, CREATININE, GLUCOSE, CALCIUM in the last 72 hours. No results for input(s): LABPT, INR in the last 72 hours.  Assessment/Plan: Plan pt today - dc am   G Dorene Grebe 09/24/2017, 8:21 AM

## 2017-09-24 NOTE — Progress Notes (Signed)
Pt. from PACU via bed; alert and oriented x4; wife at bedside; oriented to call button and room; reviewed orders with pt./wife.

## 2017-09-25 ENCOUNTER — Other Ambulatory Visit: Payer: Self-pay | Admitting: Internal Medicine

## 2017-09-25 ENCOUNTER — Encounter: Payer: Self-pay | Admitting: *Deleted

## 2017-09-25 ENCOUNTER — Telehealth (INDEPENDENT_AMBULATORY_CARE_PROVIDER_SITE_OTHER): Payer: Self-pay | Admitting: Orthopedic Surgery

## 2017-09-25 ENCOUNTER — Other Ambulatory Visit: Payer: Self-pay | Admitting: *Deleted

## 2017-09-25 ENCOUNTER — Other Ambulatory Visit: Payer: Self-pay | Admitting: Medical

## 2017-09-25 DIAGNOSIS — R0902 Hypoxemia: Secondary | ICD-10-CM | POA: Diagnosis not present

## 2017-09-25 DIAGNOSIS — Z7982 Long term (current) use of aspirin: Secondary | ICD-10-CM | POA: Diagnosis not present

## 2017-09-25 DIAGNOSIS — Z471 Aftercare following joint replacement surgery: Secondary | ICD-10-CM | POA: Diagnosis not present

## 2017-09-25 DIAGNOSIS — Z87891 Personal history of nicotine dependence: Secondary | ICD-10-CM | POA: Diagnosis not present

## 2017-09-25 DIAGNOSIS — Z96641 Presence of right artificial hip joint: Secondary | ICD-10-CM | POA: Diagnosis not present

## 2017-09-25 DIAGNOSIS — R911 Solitary pulmonary nodule: Secondary | ICD-10-CM | POA: Diagnosis not present

## 2017-09-25 DIAGNOSIS — Z8679 Personal history of other diseases of the circulatory system: Secondary | ICD-10-CM | POA: Diagnosis not present

## 2017-09-25 DIAGNOSIS — I10 Essential (primary) hypertension: Secondary | ICD-10-CM | POA: Diagnosis not present

## 2017-09-25 DIAGNOSIS — J449 Chronic obstructive pulmonary disease, unspecified: Secondary | ICD-10-CM | POA: Diagnosis not present

## 2017-09-25 MED ORDER — METHOCARBAMOL 500 MG PO TABS: 500.0000 mg | ORAL_TABLET | Freq: Three times a day (TID) | ORAL | 0 refills | Status: DC | PRN

## 2017-09-25 MED FILL — ADVAIR 500/50 DISKUS: 500-50 | 30 days supply | Qty: 60 | Fill #3

## 2017-09-25 MED FILL — ALBUTEROL 0.083% INHAL SOLN: (2.5 MG/3ML | 6 days supply | Qty: 75 | Fill #4

## 2017-09-25 MED FILL — oxyCODONE HCL 5 MG TABS: 5 | 3 days supply | Qty: 45 | Fill #0

## 2017-09-25 MED FILL — DALIRESP 500 MCG TABLET: 500 | 30 days supply | Qty: 30 | Fill #0

## 2017-09-25 MED FILL — METHOCARBAMOL 500 MG TABLET: 500 | 10 days supply | Qty: 30 | Fill #0

## 2017-09-25 NOTE — Telephone Encounter (Signed)
Please advise with any restrictions. Thanks.

## 2017-09-25 NOTE — Patient Outreach (Signed)
[facility]) Care Management  09/25/2017  Derek Conner [birth year] [id number]   Subjective: Telephone call to patient's home / mobile number, spoke with patient, and HIPAA verified.  Discussed [facility] Care Management [facility] Transition of care follow up, patient voiced understanding, and is in agreement to follow up.    Patient states he is doing pretty good considering everything, remembers speaking with this RNCM in the past, home health services started this morning, and visit went well.   Patient states he has a follow up appointment with surgeon on 10/06/17.  Patient states he is able to manage self care and has assistance as needed with activities of daily living / home management.   Patient voices understanding of medical diagnosis, surgery, and treatment plan.  [facility] benefits discussed on 09/19/17 preoperative call and patient states no additional questions at this time.  Patient states he does not have any education material, transition of care, care coordination, disease management, disease monitoring, transportation, community resource, or pharmacy needs at this time.  States he is very appreciative of the follow up and is in agreement to receive [facility] Care Management information.    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   Status post RIGHT TOTAL HIP ARTHROPLASTY ANTERIOR APPROACH at [facility] on 09/23/17.  Patient hospitalized 03/27/16 - 03/29/16 for Brain aneurysm. 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 Patient also has a history of hypertension, COPD, and Rheumatoid arthritis.   [facility] Care Management completed transition of care follow up on 04/01/16.    Assessment:Received [facility] Preoperative / Transition of Care referral on 09/18/17. Preoperative call completed, and Transition of care follow up completed, no  care management needs, and will proceed with case closure.      Plan:RNCM will send patient successful outreach letter, [facility] pamphlet, and magnet. RNCM will send case closure due to follow up completed / no care management needs request to Iverson Alamin at [facility] Care Management.     Artavius Stearns H. Gardiner Barefoot, BSN, CCM [facility] Care Management [facility] Telephonic CM Phone: [phone] Fax: [phone]

## 2017-09-25 NOTE — Telephone Encounter (Signed)
Faxed. Also called HHPT back and gave verbal orders for HHPT.

## 2017-09-25 NOTE — Telephone Encounter (Signed)
Pls call in robaxin to [facility] out pt phar [phone] po q 8 prn # [phone]

## 2017-09-25 NOTE — Telephone Encounter (Signed)
Derek Conner/PT from [facility] needs Home Care orders for patient.  Please contact her as soon as you can w/orders

## 2017-09-26 MED FILL — FAMOTIDINE 20 MG TABLET: 20 | 90 days supply | Qty: 90 | Fill #0

## 2017-09-29 DIAGNOSIS — R0902 Hypoxemia: Secondary | ICD-10-CM | POA: Diagnosis not present

## 2017-09-29 DIAGNOSIS — J449 Chronic obstructive pulmonary disease, unspecified: Secondary | ICD-10-CM | POA: Diagnosis not present

## 2017-09-29 DIAGNOSIS — I10 Essential (primary) hypertension: Secondary | ICD-10-CM | POA: Diagnosis not present

## 2017-09-29 DIAGNOSIS — Z8679 Personal history of other diseases of the circulatory system: Secondary | ICD-10-CM | POA: Diagnosis not present

## 2017-09-29 DIAGNOSIS — Z7982 Long term (current) use of aspirin: Secondary | ICD-10-CM | POA: Diagnosis not present

## 2017-09-29 DIAGNOSIS — Z96641 Presence of right artificial hip joint: Secondary | ICD-10-CM | POA: Diagnosis not present

## 2017-09-29 DIAGNOSIS — Z471 Aftercare following joint replacement surgery: Secondary | ICD-10-CM | POA: Diagnosis not present

## 2017-09-29 DIAGNOSIS — R911 Solitary pulmonary nodule: Secondary | ICD-10-CM | POA: Diagnosis not present

## 2017-09-29 DIAGNOSIS — Z87891 Personal history of nicotine dependence: Secondary | ICD-10-CM | POA: Diagnosis not present

## 2017-10-01 NOTE — Discharge Summary (Signed)
Physician Discharge Summary  Patient ID: Derek Conner MRN: [id number] DOB/AGE: 1960-12-14 57 y.o.  Admit date: 09/23/2017 Discharge date: 09/26/2017  Admission Diagnoses:  Active Problems:   Hip arthritis   Discharge Diagnoses:  Same  Surgeries: Procedure(s): RIGHT TOTAL HIP ARTHROPLASTY ANTERIOR APPROACH on 09/23/2017   Consultants:   Discharged Condition: Stable  Hospital Course: Mourad Cwikla is an 57 y.o. male who was admitted 09/23/2017 with a chief complaint of right hip pain, and found to have a diagnosis of right hip avascular necrosis and arthritis.  They were brought to the operating room on 09/23/2017 and underwent the above named procedures.  Patient tolerated the procedure well.  Was mobilized with physical therapy on postop day #1.  Pain controlled by postop day #2.  Patient was discharged to home weightbearing as tolerated on postop day #3.  He will follow-up with me in 2 weeks.  He will resume his preoperative medications including the aspirin and Brilinta.  Antibiotics given:  Anti-infectives (From admission, onward)   Start     Dose/Rate Route Frequency Ordered Stop   09/23/17 2030  ceFAZolin (ANCEF) IVPB 2g/100 mL premix     2 g 200 mL/hr over 30 Minutes Intravenous Every 6 hours 09/23/17 1949 09/24/17 0623   09/23/17 0942  ceFAZolin (ANCEF) IVPB 2g/100 mL premix     2 g 200 mL/hr over 30 Minutes Intravenous On call to O.R. 09/23/17 3086 09/23/17 1247    .  Recent vital signs:  Vitals:   09/24/17 0905 09/24/17 1451  BP:  (!) 150/84  Pulse:  87  Resp:    Temp:  98.3 F (36.8 C)  SpO2: 97% 99%    Recent laboratory studies:  Results for orders placed or performed during the hospital encounter of 09/23/17  Urine culture  Result Value Ref Range   Specimen Description URINE, CLEAN CATCH    Special Requests NONE    Culture      NO GROWTH Performed at [facility], [address], [place], [place] [address]    Report Status 09/24/2017  FINAL   Urinalysis, Routine w reflex microscopic  Result Value Ref Range   Color, Urine YELLOW YELLOW   APPearance CLEAR CLEAR   Specific Gravity, Urine 1.025 1.005 - 1.030   pH 6.0 5.0 - 8.0   Glucose, UA NEGATIVE NEGATIVE mg/dL   Hgb urine dipstick NEGATIVE NEGATIVE   Bilirubin Urine NEGATIVE NEGATIVE   Ketones, ur NEGATIVE NEGATIVE mg/dL   Protein, ur NEGATIVE NEGATIVE mg/dL   Nitrite NEGATIVE NEGATIVE   Leukocytes, UA NEGATIVE NEGATIVE  Type and screen Order type and screen if day of surgery is less than 15 days from draw of preadmission visit or order morning of surgery if day of surgery is greater than 6 days from preadmission visit.  Result Value Ref Range   ABO/RH(D) O POS    Antibody Screen NEG    Sample Expiration      09/26/2017 Performed at [facility], [address] [place], [place] [address]   ABO/Rh  Result Value Ref Range   ABO/RH(D)      O POS Performed at [facility], [address] [place], [place] [address]     Discharge Medications:   Allergies as of 09/24/2017      Reactions   Symbicort [budesonide-formoterol Fumarate] Shortness Of Breath, Nausea And Vomiting, Other (See Comments)   heachache   Dulera [mometasone Furo-formoterol Fum] Other (See Comments)   Headache  Humira [adalimumab] Nausea And Vomiting   Headaches       Medication List    STOP taking these medications   HYDROcodone-acetaminophen 5-325 MG tablet Commonly known as:  NORCO   oxyCODONE-acetaminophen 5-325 MG tablet Commonly known as:  PERCOCET/ROXICET   predniSONE 10 MG tablet Commonly known as:  DELTASONE     TAKE these medications   acetaminophen 500 MG tablet Commonly known as:  TYLENOL Take 1,000 mg by mouth daily as needed for moderate pain or headache.   ACIDOPHILUS PO Take by mouth. OTC, unsure of dose   albuterol 108 (90 Base) MCG/ACT inhaler Commonly known as:  PROVENTIL HFA;VENTOLIN HFA Inhale 2 puffs into the lungs every 4 (four) hours as  needed for wheezing or shortness of breath.   albuterol (2.5 MG/3ML) 0.083% nebulizer solution Commonly known as:  PROVENTIL Take 3 mLs (2.5 mg total) by nebulization every 6 (six) hours as needed for wheezing or shortness of breath. DX: J44.9   ALPRAZolam 0.5 MG tablet Commonly known as:  XANAX 1 tab po q day as needed severe anxiety/panic attack. What changed:    how much to take  how to take this  when to take this  reasons to take this  additional instructions   amLODipine 10 MG tablet Commonly known as:  NORVASC TAKE 1 TABLET (10 MG) BY MOUTH DAILY. What changed:  See the new instructions.   aspirin EC 81 MG tablet Take 325 mg by mouth daily.   cloNIDine 0.2 MG tablet Commonly known as:  CATAPRES Take 1 tablet (0.2 mg total) by mouth 2 (two) times daily.   cyclobenzaprine 10 MG tablet Commonly known as:  FLEXERIL Take 1 tablet (10 mg total) by mouth 2 (two) times daily as needed for muscle spasms.   fenofibrate 48 MG tablet Commonly known as:  TRICOR TAKE 1 TABLET (48 MG TOTAL) BY MOUTH DAILY. What changed:  when to take this   Fluticasone-Salmeterol 500-50 MCG/DOSE Aepb Commonly known as:  ADVAIR DISKUS Inhale 1 puff into the lungs 2 (two) times daily.   pantoprazole 40 MG tablet Commonly known as:  PROTONIX Take 1 tablet (40 mg total) by mouth daily at 12 noon.   ticagrelor 90 MG Tabs tablet Commonly known as:  BRILINTA Take 45-90 mg by mouth See admin instructions. Take 90 mg in the morning and 45 mg at night   tiotropium 18 MCG inhalation capsule Commonly known as:  SPIRIVA HANDIHALER PLACE 1 CAPSULE INTO INHALER AND INHALE INTO LUNGS DAILY.       Diagnostic Studies: Dg C-arm 1-60 Min  Result Date: 09/23/2017 CLINICAL DATA:  Status post right total hip arthroplasty EXAM: OPERATIVE RIGHT HIP: 1 VIEW TECHNIQUE: Fluoroscopic spot image(s) were submitted for interpretation post-operatively. FLUOROSCOPY TIME:  0 MINUTES 46 SECONDS; 4 ACQUIRED IMAGES  COMPARISON:  None. FINDINGS: There is a total hip replacement on the right with prosthetic components appearing well-seated on frontal view. No acute fracture or dislocation. No erosive change. IMPRESSION: Total hip replacement right with prosthetic components appearing well-seated on frontal view. No evident fracture or dislocation. Electronically Signed   By: Bretta Bang III M.D.   On: 09/23/2017 15:10   Dg C-arm 1-60 Min  Result Date: 09/23/2017 CLINICAL DATA:  Status post right total hip arthroplasty EXAM: OPERATIVE RIGHT HIP: 1 VIEW TECHNIQUE: Fluoroscopic spot image(s) were submitted for interpretation post-operatively. FLUOROSCOPY TIME:  0 MINUTES 46 SECONDS; 4 ACQUIRED IMAGES COMPARISON:  None. FINDINGS: There is a total hip replacement on the  right with prosthetic components appearing well-seated on frontal view. No acute fracture or dislocation. No erosive change. IMPRESSION: Total hip replacement right with prosthetic components appearing well-seated on frontal view. No evident fracture or dislocation. Electronically Signed   By: Bretta Bang III M.D.   On: 09/23/2017 15:10   Dg Hip Port Unilat With Pelvis 1v Right  Result Date: 09/23/2017 CLINICAL DATA:  Total hip replacement. EXAM: DG HIP (WITH OR WITHOUT PELVIS) 1V PORT RIGHT COMPARISON:  09/23/2017. FINDINGS: Patient rotated. Total right hip replacement. Hardware intact. Anatomic alignment. IMPRESSION: Total right hip replacement with anatomic alignment. Electronically Signed   By: Maisie Fus  Register   On: 09/23/2017 16:26   Dg Hip Operative Unilat W Or W/o Pelvis Right  Result Date: 09/23/2017 CLINICAL DATA:  Status post right total hip arthroplasty EXAM: OPERATIVE RIGHT HIP: 1 VIEW TECHNIQUE: Fluoroscopic spot image(s) were submitted for interpretation post-operatively. FLUOROSCOPY TIME:  0 MINUTES 46 SECONDS; 4 ACQUIRED IMAGES COMPARISON:  None. FINDINGS: There is a total hip replacement on the right with prosthetic components  appearing well-seated on frontal view. No acute fracture or dislocation. No erosive change. IMPRESSION: Total hip replacement right with prosthetic components appearing well-seated on frontal view. No evident fracture or dislocation. Electronically Signed   By: Bretta Bang III M.D.   On: 09/23/2017 15:10    Disposition: 01-Home or Self Care  Discharge Instructions    Call MD / Call 911   Complete by:  As directed    If you experience chest pain or shortness of breath, CALL 911 and be transported to the hospital emergency room.  If you develope a fever above 101 F, pus (white drainage) or increased drainage or redness at the wound, or calf pain, call your surgeon's office.   Call MD / Call 911   Complete by:  As directed    If you experience chest pain or shortness of breath, CALL 911 and be transported to the hospital emergency room.  If you develope a fever above 101 F, pus (white drainage) or increased drainage or redness at the wound, or calf pain, call your surgeon's office.   Constipation Prevention   Complete by:  As directed    Drink plenty of fluids.  Prune juice may be helpful.  You may use a stool softener, such as Colace (over the counter) 100 mg twice a day.  Use MiraLax (over the counter) for constipation as needed.   Constipation Prevention   Complete by:  As directed    Drink plenty of fluids.  Prune juice may be helpful.  You may use a stool softener, such as Colace (over the counter) 100 mg twice a day.  Use MiraLax (over the counter) for constipation as needed.   Diet - low sodium heart healthy   Complete by:  As directed    Diet - low sodium heart healthy   Complete by:  As directed    Discharge instructions   Complete by:  As directed    Weight bearing as tolerated with walker Ok to shower - dressing waterproof Remove dressing 10/04/2017 - no need to re cover then   Increase activity slowly as tolerated   Complete by:  As directed    Increase activity slowly as  tolerated   Complete by:  As directed       Follow-up Information    [facility] - Dme Follow up.   Why:  A representative from [facility] will contact you to  arrange start date and time for your therapy. Contact information: [address] [place] [address] [phone]            Signed: Burnard Bunting 10/01/2017, 9:52 AM

## 2017-10-02 DIAGNOSIS — Z96641 Presence of right artificial hip joint: Secondary | ICD-10-CM | POA: Diagnosis not present

## 2017-10-02 DIAGNOSIS — Z471 Aftercare following joint replacement surgery: Secondary | ICD-10-CM | POA: Diagnosis not present

## 2017-10-02 DIAGNOSIS — R911 Solitary pulmonary nodule: Secondary | ICD-10-CM | POA: Diagnosis not present

## 2017-10-02 DIAGNOSIS — Z8679 Personal history of other diseases of the circulatory system: Secondary | ICD-10-CM | POA: Diagnosis not present

## 2017-10-02 DIAGNOSIS — R0902 Hypoxemia: Secondary | ICD-10-CM | POA: Diagnosis not present

## 2017-10-02 DIAGNOSIS — I10 Essential (primary) hypertension: Secondary | ICD-10-CM | POA: Diagnosis not present

## 2017-10-02 DIAGNOSIS — J449 Chronic obstructive pulmonary disease, unspecified: Secondary | ICD-10-CM | POA: Diagnosis not present

## 2017-10-02 DIAGNOSIS — Z7982 Long term (current) use of aspirin: Secondary | ICD-10-CM | POA: Diagnosis not present

## 2017-10-02 DIAGNOSIS — Z87891 Personal history of nicotine dependence: Secondary | ICD-10-CM | POA: Diagnosis not present

## 2017-10-05 DIAGNOSIS — J449 Chronic obstructive pulmonary disease, unspecified: Secondary | ICD-10-CM | POA: Diagnosis not present

## 2017-10-06 ENCOUNTER — Ambulatory Visit (INDEPENDENT_AMBULATORY_CARE_PROVIDER_SITE_OTHER): Payer: 59 | Admitting: Orthopedic Surgery

## 2017-10-06 ENCOUNTER — Ambulatory Visit (INDEPENDENT_AMBULATORY_CARE_PROVIDER_SITE_OTHER): Payer: 59

## 2017-10-06 ENCOUNTER — Encounter (INDEPENDENT_AMBULATORY_CARE_PROVIDER_SITE_OTHER): Payer: Self-pay | Admitting: Orthopedic Surgery

## 2017-10-06 DIAGNOSIS — Z96641 Presence of right artificial hip joint: Secondary | ICD-10-CM

## 2017-10-06 NOTE — Progress Notes (Signed)
Post-Op Visit Note   Patient: Derek Conner           Date of Birth: 10/13/1960           MRN: [id number] Visit Date: 10/06/2017 PCP: Esperanza Richters, PA-C   Assessment & Plan:  Chief Complaint:  Chief Complaint  Patient presents with  . Right Hip - Routine Post Op   Visit Diagnoses:  1. Status post total replacement of right hip     Plan: [name] is a patient who is now 2 weeks out right total hip replacement.  He can walk 200 feet.  He is taking Tylenol and Robaxin for pain.  Reports pain is 3 out of 10.  On examination he has improving hip strength and approximately equal leg lengths.  He is walking well with a walker.  Plan is to let him return to work next week for 1/2 days for 2 weeks starting next Monday.  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  I will see him back in 6 weeks for clinical recheck only.  Follow-Up Instructions: Return in about 6 weeks (around 11/17/2017).   Orders:  Orders Placed This Encounter  Procedures  . XR HIP UNILAT W OR W/O PELVIS 2-3 VIEWS RIGHT   No orders of the defined types were placed in this encounter.   Imaging: Xr Hip Unilat W Or W/o Pelvis 2-3 Views Right  Result Date: 10/06/2017 AP pelvis lateral right hip demonstrates total hip prosthesis in good position alignment.  No evidence of hardware complication or failure.  No evidence of loosening.  No periprosthetic fracture.   PMFS History: Patient Active Problem List   Diagnosis Date Noted  . 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 Nocturnal hypoxemia 04/03/2017  . Fatigue 01/09/2017  . Thrush 12/12/2016  . Brain aneurysm 03/27/2016  . Preop respiratory exam 03/26/2016  . Aneurysm (HCC)   . Chronic obstructive pulmonary disease with acute exacerbation (HCC) 01/19/2016  . Community acquired pneumonia 01/19/2016  . Cough syncope 01/19/2016  . Wellness examination 08/29/2015  . Hyperglycemia 01/06/2015  . HTN (hypertension)  12/09/2014  . COPD exacerbation (HCC) 07/08/2014  . Atypical chest pain 07/08/2014  . 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 Dry mouth 12/26/2013  . Cigarette smoker 12/26/2013  . Nodule of left lung 12/26/2013  . COPD, severity to be determined (HCC) 12/26/2013  . Pulmonary nodule, right 12/06/2013  . Hemoptysis 12/03/2013  . COPD (chronic obstructive pulmonary disease) (HCC) 12/03/2013  . Acute bronchitis 12/03/2013  . Tobacco use disorder 12/03/2013   Past Medical History:  Diagnosis Date  . Asthma   . CAP (community acquired pneumonia) 01/19/2016  . Chronic bronchitis (HCC)   . COPD (chronic obstructive pulmonary disease) (HCC)   . Emphysema lung (HCC)   . Emphysema of lung (HCC)   . GERD (gastroesophageal reflux disease)   . Hepatitis A infection ~ 1980  . High cholesterol   . Hypertension   . Pneumonia 1960s  . Rheumatoid arthritis (HCC)    "hands mostly"  ( 01/19/2016)  . Situational depression 2009/12/18   "only when my mother died"  . Stroke [facility])    mini stroke when doing embolization, 2015/12/19, no deficits    Family History  Problem Relation Age of Onset  . Cancer Mother   . Arthritis Mother   . Stroke Father   . Hypertension Father   . Colon cancer Neg Hx  Past Surgical History:  Procedure Laterality Date  . APPENDECTOMY  1980's  . CHEST TUBE INSERTION Left 1990's   "lung collapsed"  . CYSTECTOMY Left 1960's   "wrist"  . FOOT FRACTURE SURGERY Left ~ 2005   "it was crushed"  . IR GENERIC HISTORICAL  03/27/2016   IR ANGIOGRAM FOLLOW UP STUDY 03/27/2016 Julieanne Cotton, MD [facility]  . IR GENERIC HISTORICAL  03/27/2016   IR ANGIOGRAM FOLLOW UP STUDY 03/27/2016 Julieanne Cotton, MD [facility]  . IR GENERIC HISTORICAL  03/27/2016   IR ANGIO INTRA EXTRACRAN SEL COM CAROTID INNOMINATE UNI L MOD SED 03/27/2016 Julieanne Cotton, MD [facility]  . IR GENERIC HISTORICAL  03/27/2016   IR ANGIOGRAM FOLLOW UP STUDY 03/27/2016 Julieanne Cotton, MD [facility]  . IR  GENERIC HISTORICAL  03/27/2016   IR ANGIO VERTEBRAL SEL SUBCLAVIAN INNOMINATE UNI R MOD SED 03/27/2016 Julieanne Cotton, MD [facility]  . IR GENERIC HISTORICAL  03/27/2016   IR 3D INDEPENDENT WKST 03/27/2016 Julieanne Cotton, MD [facility]  . IR GENERIC HISTORICAL  03/27/2016   IR ANGIOGRAM FOLLOW UP STUDY 03/27/2016 Julieanne Cotton, MD [facility]  . IR GENERIC HISTORICAL  03/27/2016   IR NEURO EACH ADD'L AFTER BASIC UNI RIGHT (MS) 03/27/2016 Julieanne Cotton, MD [facility]  . IR GENERIC HISTORICAL  03/27/2016   IR ANGIOGRAM FOLLOW UP STUDY 03/27/2016 Julieanne Cotton, MD [facility]  . IR GENERIC HISTORICAL  03/27/2016   IR TRANSCATH/EMBOLIZ 03/27/2016 Julieanne Cotton, MD [facility]  . IR GENERIC HISTORICAL  03/27/2016   IR ANGIO INTRA EXTRACRAN SEL INTERNAL CAROTID UNI R MOD SED 03/27/2016 Julieanne Cotton, MD [facility]  . IR GENERIC HISTORICAL  03/27/2016   IR ANGIOGRAM FOLLOW UP STUDY 03/27/2016 Julieanne Cotton, MD [facility]  . IR GENERIC HISTORICAL  04/12/2016   IR RADIOLOGIST EVAL & MGMT 04/12/2016 [facility]  . RADIOLOGY WITH ANESTHESIA N/A 02/29/2016   Procedure: EMBOLIZATION     (RADIOLOGY WITH ANESTHESIA);  Surgeon: Julieanne Cotton, MD;  Location: [facility] OR;  Service: Radiology;  Laterality: N/A;  . RADIOLOGY WITH ANESTHESIA N/A 03/27/2016   Procedure: Embolization;  Surgeon: Julieanne Cotton, MD;  Location: [facility];  Service: Radiology;  Laterality: N/A;  . TOTAL HIP ARTHROPLASTY Right 09/23/2017  . TOTAL HIP ARTHROPLASTY Right 09/23/2017   Procedure: RIGHT TOTAL HIP ARTHROPLASTY ANTERIOR APPROACH;  Surgeon: Cammy Copa, MD;  Location: [facility] OR;  Service: Orthopedics;  Laterality: Right;  Marland Kitchen VIDEO BRONCHOSCOPY Bilateral 12/06/2013   Procedure: VIDEO BRONCHOSCOPY WITHOUT FLUORO;  Surgeon: Alyson Reedy, MD;  Location: [facility] ENDOSCOPY;  Service: Cardiopulmonary;  Laterality: Bilateral;   Social History   Occupational History  . Not on file  Tobacco Use  .  Smoking status: Current Every Day Smoker    Packs/day: 0.50    Years: 40.00    Pack years: 20.00    Types: Cigarettes  . Smokeless tobacco: Never Used  . Tobacco comment: smoking 1/2ppd as of 05/23/17  ep  Substance and Sexual Activity  . Alcohol use: Yes    Comment: 3 drinks liquor per day  . Drug use: No    Comment: 1-2 times per week  . Sexual activity: Yes

## 2017-10-07 DIAGNOSIS — Z8679 Personal history of other diseases of the circulatory system: Secondary | ICD-10-CM | POA: Diagnosis not present

## 2017-10-07 DIAGNOSIS — J449 Chronic obstructive pulmonary disease, unspecified: Secondary | ICD-10-CM | POA: Diagnosis not present

## 2017-10-07 DIAGNOSIS — R911 Solitary pulmonary nodule: Secondary | ICD-10-CM | POA: Diagnosis not present

## 2017-10-07 DIAGNOSIS — Z7982 Long term (current) use of aspirin: Secondary | ICD-10-CM | POA: Diagnosis not present

## 2017-10-07 DIAGNOSIS — Z96641 Presence of right artificial hip joint: Secondary | ICD-10-CM | POA: Diagnosis not present

## 2017-10-07 DIAGNOSIS — Z471 Aftercare following joint replacement surgery: Secondary | ICD-10-CM | POA: Diagnosis not present

## 2017-10-07 DIAGNOSIS — I10 Essential (primary) hypertension: Secondary | ICD-10-CM | POA: Diagnosis not present

## 2017-10-07 DIAGNOSIS — R0902 Hypoxemia: Secondary | ICD-10-CM | POA: Diagnosis not present

## 2017-10-07 DIAGNOSIS — J441 Chronic obstructive pulmonary disease with (acute) exacerbation: Secondary | ICD-10-CM | POA: Diagnosis not present

## 2017-10-07 DIAGNOSIS — Z87891 Personal history of nicotine dependence: Secondary | ICD-10-CM | POA: Diagnosis not present

## 2017-10-09 DIAGNOSIS — Z471 Aftercare following joint replacement surgery: Secondary | ICD-10-CM | POA: Diagnosis not present

## 2017-10-09 DIAGNOSIS — R911 Solitary pulmonary nodule: Secondary | ICD-10-CM | POA: Diagnosis not present

## 2017-10-09 DIAGNOSIS — J449 Chronic obstructive pulmonary disease, unspecified: Secondary | ICD-10-CM | POA: Diagnosis not present

## 2017-10-09 DIAGNOSIS — Z7982 Long term (current) use of aspirin: Secondary | ICD-10-CM | POA: Diagnosis not present

## 2017-10-09 DIAGNOSIS — R0902 Hypoxemia: Secondary | ICD-10-CM | POA: Diagnosis not present

## 2017-10-09 DIAGNOSIS — Z8679 Personal history of other diseases of the circulatory system: Secondary | ICD-10-CM | POA: Diagnosis not present

## 2017-10-09 DIAGNOSIS — Z96641 Presence of right artificial hip joint: Secondary | ICD-10-CM | POA: Diagnosis not present

## 2017-10-09 DIAGNOSIS — Z87891 Personal history of nicotine dependence: Secondary | ICD-10-CM | POA: Diagnosis not present

## 2017-10-09 DIAGNOSIS — I10 Essential (primary) hypertension: Secondary | ICD-10-CM | POA: Diagnosis not present

## 2017-10-10 NOTE — Progress Notes (Signed)
Office Visit Note  Patient: Derek Conner             Date of Birth: 1960-12-03           MRN: [id number]             PCP: Marisue Brooklyn Referring: Marisue Brooklyn Visit Date: 10/20/2017 Occupation: Nature conservation officer    Subjective:  Pain hip joints and left trigger thumb.   History of Present Illness: Derek Conner is a 57 y.o. male seen in consultation per request of Dr. August Saucer.  According to patient he was diagnosed  12 years ago with rheumatoid arthritis by Dr. Hermine Messick.  He was a started on Humira which she took for 2-3 years.  He states his symptoms resolved after taking Humira but he had a lot of side effects.  He states he is to get nausea vomiting and headaches about 3-4 days after each shot.  After using Humira for 3 years he decided to quit taking it.  He states he did not have any recurrence of swelling and he did not really well.  He has been having bilateral hip joint pain since September 2018.  He was diagnosed with osteoarthritis and had cortisone injections.  February 2019 he underwent right total hip replacement and is recovering from that.  He has been having left hip joint discomfort as well which will require surgery.  None of the other joints have been swollen or painful.  He has left trigger thumb.  Activities of Daily Living:  Patient reports morning stiffness for 0 minute.   Patient Denies nocturnal pain.  Difficulty dressing/grooming: Reports Difficulty climbing stairs: Reports Difficulty getting out of chair: Reports Difficulty using hands for taps, buttons, cutlery, and/or writing: Denies   Review of Systems  Constitutional: Negative for fatigue, night sweats and weakness ( ).  HENT: Positive for mouth dryness. Negative for mouth sores and nose dryness.        Related to medication use  Eyes: Negative for redness and dryness.  Respiratory: Negative for shortness of breath and difficulty breathing.   Cardiovascular: Positive for  hypertension. Negative for chest pain, palpitations, irregular heartbeat and swelling in legs/feet.  Gastrointestinal: Negative for constipation and diarrhea.  Endocrine: Negative for increased urination.  Musculoskeletal: Positive for arthralgias and joint pain. Negative for joint swelling, myalgias, muscle weakness, morning stiffness, muscle tenderness and myalgias.  Skin: Negative for color change, rash, hair loss, nodules/bumps, skin tightness, ulcers and sensitivity to sunlight.  Allergic/Immunologic: Negative for susceptible to infections.  Neurological: Negative for dizziness, fainting, memory loss and night sweats.  Hematological: Negative for swollen glands.  Psychiatric/Behavioral: Positive for sleep disturbance. Negative for depressed mood. The patient is not nervous/anxious.     PMFS History:  Patient Active Problem List   Diagnosis Date Noted  . 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 Nocturnal hypoxemia 04/03/2017  . Fatigue 01/09/2017  . Thrush 12/12/2016  . Brain aneurysm 03/27/2016  . Preop respiratory exam 03/26/2016  . Aneurysm (HCC)   . Chronic obstructive pulmonary disease with acute exacerbation (HCC) 01/19/2016  . Community acquired pneumonia 01/19/2016  . Cough syncope 01/19/2016  . Wellness examination 08/29/2015  . Hyperglycemia 01/06/2015  . HTN (hypertension) 12/09/2014  . COPD exacerbation (HCC) 07/08/2014  . Atypical chest pain 07/08/2014  . 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 Dry mouth 12/26/2013  . Cigarette smoker 12/26/2013  . Nodule of left lung 12/26/2013  . COPD, severity to be determined (HCC) 12/26/2013  . Pulmonary nodule, right  12/06/2013  . Hemoptysis 12/03/2013  . COPD (chronic obstructive pulmonary disease) (HCC) 12/03/2013  . Acute bronchitis 12/03/2013  . Tobacco use disorder 12/03/2013    Past Medical History:  Diagnosis Date  . Asthma   . CAP (community acquired pneumonia) 01/19/2016  . Chronic bronchitis (HCC)   . COPD (chronic obstructive  pulmonary disease) (HCC)   . Emphysema lung (HCC)   . Emphysema of lung (HCC)   . GERD (gastroesophageal reflux disease)   . Hepatitis A infection ~ 1980  . High cholesterol   . Hypertension   . Pneumonia 1960s  . Rheumatoid arthritis (HCC)    "hands mostly"  ( 01/19/2016)  . Situational depression 2009/11/25   "only when my mother died"  . Stroke [facility])    mini stroke when doing embolization, Nov 26, 2015, no deficits    Family History  Problem Relation Age of Onset  . Cancer Mother   . Arthritis Mother   . Stroke Father   . Hypertension Father   . Healthy Daughter   . Healthy Son   . Colon cancer Neg Hx    Past Surgical History:  Procedure Laterality Date  . APPENDECTOMY  1980's  . CHEST TUBE INSERTION Left 1990's   "lung collapsed"  . CYSTECTOMY Left 1960's   "wrist"  . FOOT FRACTURE SURGERY Left ~ November 26, 2003   "it was crushed"  . IR GENERIC HISTORICAL  03/27/2016   IR ANGIOGRAM FOLLOW UP STUDY 03/27/2016 Julieanne Cotton, MD [facility]  . IR GENERIC HISTORICAL  03/27/2016   IR ANGIOGRAM FOLLOW UP STUDY 03/27/2016 Julieanne Cotton, MD [facility]  . IR GENERIC HISTORICAL  03/27/2016   IR ANGIO INTRA EXTRACRAN SEL COM CAROTID INNOMINATE UNI L MOD SED 03/27/2016 Julieanne Cotton, MD [facility]  . IR GENERIC HISTORICAL  03/27/2016   IR ANGIOGRAM FOLLOW UP STUDY 03/27/2016 Julieanne Cotton, MD [facility]  . IR GENERIC HISTORICAL  03/27/2016   IR ANGIO VERTEBRAL SEL SUBCLAVIAN INNOMINATE UNI R MOD SED 03/27/2016 Julieanne Cotton, MD [facility]  . IR GENERIC HISTORICAL  03/27/2016   IR 3D INDEPENDENT WKST 03/27/2016 Julieanne Cotton, MD [facility]  . IR GENERIC HISTORICAL  03/27/2016   IR ANGIOGRAM FOLLOW UP STUDY 03/27/2016 Julieanne Cotton, MD [facility]  . IR GENERIC HISTORICAL  03/27/2016   IR NEURO EACH ADD'L AFTER BASIC UNI RIGHT (MS) 03/27/2016 Julieanne Cotton, MD [facility]  . IR GENERIC HISTORICAL  03/27/2016   IR ANGIOGRAM FOLLOW UP STUDY 03/27/2016 Julieanne Cotton, MD [facility]  . IR GENERIC HISTORICAL  03/27/2016   IR TRANSCATH/EMBOLIZ 03/27/2016 Julieanne Cotton, MD [facility]  . IR GENERIC HISTORICAL  03/27/2016   IR ANGIO INTRA EXTRACRAN SEL INTERNAL CAROTID UNI R MOD SED 03/27/2016 Julieanne Cotton, MD [facility]  . IR GENERIC HISTORICAL  03/27/2016   IR ANGIOGRAM FOLLOW UP STUDY 03/27/2016 Julieanne Cotton, MD [facility]  . IR GENERIC HISTORICAL  04/12/2016   IR RADIOLOGIST EVAL & MGMT 04/12/2016 [facility]  . RADIOLOGY WITH ANESTHESIA N/A 02/29/2016   Procedure: EMBOLIZATION     (RADIOLOGY WITH ANESTHESIA);  Surgeon: Julieanne Cotton, MD;  Location: [facility] OR;  Service: Radiology;  Laterality: N/A;  . RADIOLOGY WITH ANESTHESIA N/A 03/27/2016   Procedure: Embolization;  Surgeon: Julieanne Cotton, MD;  [facility];  Service: Radiology;  Laterality: N/A;  . TOTAL HIP ARTHROPLASTY Right 09/23/2017  . TOTAL HIP ARTHROPLASTY Right 09/23/2017   Procedure: RIGHT TOTAL HIP ARTHROPLASTY ANTERIOR APPROACH;  Surgeon: Cammy Copa, MD;  Location: [facility] OR;  Service: Orthopedics;  Laterality: Right;  Marland Kitchen VIDEO BRONCHOSCOPY Bilateral 12/06/2013   Procedure: VIDEO BRONCHOSCOPY WITHOUT FLUORO;  Surgeon: Alyson Reedy, MD;  Location: [facility] ENDOSCOPY;  Service: Cardiopulmonary;  Laterality: Bilateral;   Social History   Social History Narrative  . Not on file     Objective: Vital Signs: BP 127/80 (BP Location: Left Arm, Patient Position: Sitting, Cuff Size: Normal)   Pulse 87   Resp 16   Ht 6' (1.829 m)   Wt 188 lb 8 oz (85.5 kg)   BMI 25.57 kg/m    Physical Exam  Constitutional: He is oriented to person, place, and time. He appears well-developed and well-nourished.  HENT:  Head: Normocephalic and atraumatic.  Eyes: Conjunctivae and EOM are normal. Pupils are equal, round, and reactive to light.  Neck: Normal range of motion. Neck supple.  Cardiovascular: Normal rate, regular rhythm and normal heart sounds.  Pulmonary/Chest:  Effort normal and breath sounds normal.  Abdominal: Soft. Bowel sounds are normal.  Neurological: He is alert and oriented to person, place, and time.  Skin: Skin is warm and dry. Capillary refill takes less than 2 seconds.  A small lipoma was noted over the left clavicle.  Psychiatric: He has a normal mood and affect. His behavior is normal.  Nursing note and vitals reviewed.    Musculoskeletal Exam: C-spine thoracic lumbar spine good range of motion.  Shoulder joints elbow joints wrist joint MCPs PIPs DIPs are good range of motion with no synovitis.  He has limited range of motion of his right hip joint which has been replaced without discomfort and limited range of motion of his left hip joint with discomfort.  Knee joints are in good range of motion without any warmth swelling or effusion.  Ankle joints, MTPs and PIPs are good range of motion without any synovitis.  CDAI Exam: No CDAI exam completed.    Investigation: No additional findings.   Component     Latest Ref Rng & Units 06/12/2017  WBC     3.8 - 10.8 Thousand/uL 11.7 (H)  RBC     4.20 - 5.80 Million/uL 4.63  Hemoglobin     13.2 - 17.1 g/dL 40.1  HCT     02.7 - 25.3 % 40.2  MCV     80.0 - 100.0 fL 86.8  MCH     27.0 - 33.0 pg 29.4  MCHC     32.0 - 36.0 g/dL 66.4  RDW     40.3 - 47.4 % 12.8  Platelets     140 - 400 Thousand/uL 472 (H)  MPV     7.5 - 12.5 fL 9.6  NEUT#     1,500 - 7,800 cells/uL 8,178 (H)  Lymphocyte #     850 - 3,900 cells/uL 2,328  WBC mixed population     200 - 950 cells/uL 1,006 (H)  Eosinophils Absolute     15 - 500 cells/uL 140  Basophils Absolute     0 - 200 cells/uL 47  Neutrophils     % 69.9  Total Lymphocyte     % 19.9  Monocytes Relative     % 8.6  Eosinophil     % 1.2  Basophil     % 0.4  Sed Rate     0 - 20 mm/h 14  CRP     <8.0 mg/L 8.3 (H)  RA Latex Turbid.     <14 IU/mL 96 (H)  Anit Nuclear  Antibody(ANA)     NEGATIVE NEGATIVE  Cyclic Citrullin Peptide Ab      UNITS >250 (H)  Uric Acid, Serum     4.0 - 8.0 mg/dL 6.7   CBC Latest Ref Rng & Units 09/15/2017 06/22/2017 06/12/2017  WBC 4.0 - 10.5 K/uL 10.4 9.3 11.7(H)  Hemoglobin 13.0 - 17.0 g/dL 78.2 95.6 21.3  Hematocrit 39.0 - 52.0 % 41.8 41.6 40.2  Platelets 150 - 400 K/uL 447(H) 355 472(H)   CMP Latest Ref Rng & Units 09/15/2017 06/22/2017 05/29/2017  Glucose 65 - 99 mg/dL 086(V) 784(O) 962(X)  BUN 6 - 20 mg/dL 19 13 16   Creatinine 0.61 - 1.24 mg/dL 5.28(U) 1.32 4.40  Sodium 135 - 145 mmol/L 137 136 137  Potassium 3.5 - 5.1 mmol/L 4.8 3.3(L) 4.2  Chloride 101 - 111 mmol/L 104 104 103  CO2 22 - 32 mmol/L 23 23 -  Calcium 8.9 - 10.3 mg/dL 10.2 9.3 -  Total Protein 6.5 - 8.1 g/dL 6.5 - -  Total Bilirubin 0.3 - 1.2 mg/dL 0.6 - -  Alkaline Phos 38 - 126 U/L 81 - -  AST 15 - 41 U/L 18 - -  ALT 17 - 63 U/L 19 - -    Imaging: Dg C-arm 1-60 Min  Result Date: 09/23/2017 CLINICAL DATA:  Status post right total hip arthroplasty EXAM: OPERATIVE RIGHT HIP: 1 VIEW TECHNIQUE: Fluoroscopic spot image(s) were submitted for interpretation post-operatively. FLUOROSCOPY TIME:  0 MINUTES 46 SECONDS; 4 ACQUIRED IMAGES COMPARISON:  None. FINDINGS: There is a total hip replacement on the right with prosthetic components appearing well-seated on frontal view. No acute fracture or dislocation. No erosive change. IMPRESSION: Total hip replacement right with prosthetic components appearing well-seated on frontal view. No evident fracture or dislocation. Electronically Signed   By: Bretta Bang III M.D.   On: 09/23/2017 15:10   Dg C-arm 1-60 Min  Result Date: 09/23/2017 CLINICAL DATA:  Status post right total hip arthroplasty EXAM: OPERATIVE RIGHT HIP: 1 VIEW TECHNIQUE: Fluoroscopic spot image(s) were submitted for interpretation post-operatively. FLUOROSCOPY TIME:  0 MINUTES 46 SECONDS; 4 ACQUIRED IMAGES COMPARISON:  None. FINDINGS: There is a total hip replacement on the right with prosthetic components  appearing well-seated on frontal view. No acute fracture or dislocation. No erosive change. IMPRESSION: Total hip replacement right with prosthetic components appearing well-seated on frontal view. No evident fracture or dislocation. Electronically Signed   By: Bretta Bang III M.D.   On: 09/23/2017 15:10   Dg Hip Port Unilat With Pelvis 1v Right  Result Date: 09/23/2017 CLINICAL DATA:  Total hip replacement. EXAM: DG HIP (WITH OR WITHOUT PELVIS) 1V PORT RIGHT COMPARISON:  09/23/2017. FINDINGS: Patient rotated. Total right hip replacement. Hardware intact. Anatomic alignment. IMPRESSION: Total right hip replacement with anatomic alignment. Electronically Signed   By: Maisie Fus  Register   On: 09/23/2017 16:26   Dg Hip Operative Unilat W Or W/o Pelvis Right  Result Date: 09/23/2017 CLINICAL DATA:  Status post right total hip arthroplasty EXAM: OPERATIVE RIGHT HIP: 1 VIEW TECHNIQUE: Fluoroscopic spot image(s) were submitted for interpretation post-operatively. FLUOROSCOPY TIME:  0 MINUTES 46 SECONDS; 4 ACQUIRED IMAGES COMPARISON:  None. FINDINGS: There is a total hip replacement on the right with prosthetic components appearing well-seated on frontal view. No acute fracture or dislocation. No erosive change. IMPRESSION: Total hip replacement right with prosthetic components appearing well-seated on frontal view. No evident fracture or dislocation. Electronically Signed   By: Bretta Bang III M.D.   On:  09/23/2017 15:10   Xr Hip Unilat W Or W/o Pelvis 2-3 Views Right  Result Date: 10/06/2017 AP pelvis lateral right hip demonstrates total hip prosthesis in good position alignment.  No evidence of hardware complication or failure.  No evidence of loosening.  No periprosthetic fracture.  Xr Foot 2 Views Left  Result Date: 10/20/2017 Hardware noted in the first metatarsal with a screw and plate.  Subluxation of the fifth MTP joint was noted.  Erosive changes were noted in second third fourth and fifth  MTP joint with fifth MTP subluxation.  DIP narrowing was noted.  No significant intertarsal or tibiotalar joint space narrowing was noted. Impression: These findings are consistent with rheumatoid arthritis.  Patient reports he had a crush injury to his left foot several years ago which could be the explanation for the MTP changes.  Xr Foot 2 Views Right  Result Date: 10/20/2017 Erosive change was noted in the fifth MTP joint.  No other MTP joint showed any significant narrowing.  No tibiotalar or subtalar joint space narrowing or intertarsal joint space narrowing was noted. Impression: These findings were consistent with rheumatoid arthritis.  Xr Hand 2 View Left  Result Date: 10/20/2017 PIP narrowing was noted.  No MCP changes or erosive changes were noted.  No intercarpal or radiocarpal joint space narrowing was noted.  Mild juxta-articular osteopenia was noted. Impression: These findings could be consistent with osteoarthritis and inflammatory arthritis overlap.  Xr Hand 2 View Right  Result Date: 10/20/2017 PIP narrowing was noted.  No MCP changes or erosive changes were noted.  No intercarpal or radiocarpal joint space narrowing was noted.  Mild juxta-articular osteopenia was noted. Impression: These findings could be consistent with osteoarthritis and inflammatory arthritis overlap.   Speciality Comments: No specialty comments available.    Procedures:  No procedures performed Allergies: Symbicort [budesonide-formoterol fumarate]; Dulera [mometasone furo-formoterol fum]; and Humira [adalimumab]   Assessment / Plan:     Visit Diagnoses: Polyarthralgia - +RF, +CCP, CRP elevated.  Patient gives history of rheumatoid arthritis with inflammatory arthritis involving his hands and feet started 12 years ago.  He was treated by rheumatologist with Humira for about 3 years.  He stopped Humira due to side effects and went into remission after that.  He denies any joint pain or swelling in his  hands and feet today.  The hand x-rays revealed mild juxta-articular osteopenia and osteoarthritic changes.  Right fifth MTP showed erosive change.  Left foot shows postsurgical changes with hardware in his first  metatarsal.  He also has some changes in other MTPs I am uncertain if it is due to rheumatoid arthritis or the injury.  H/O total hip arthroplasty, right - History of AVN and osteoarthritis , right total hip replacement done by Dr. August Saucer 09/23/17.  Patient is very pleased with his surgery and is almost pain-free.  Avascular necrosis of bone of left hip [facility]): He continues to have a lot of discomfort in his left hip joint with limited range of motion.  He wants to have left total hip replacement.  Essential hypertension: His blood pressure is mildly elevated.  History of stroke: Related to brain aneurysm.  Brain aneurysm  Former smoker: He has quit smoking after the hip replacement.  Association of smoking with rheumatoid arthritis was discussed at length.  History of COPD: Related to smoking.  Pulmonary nodule, right: Patient reports it is benign.  History of gastroesophageal reflux (GERD)    Orders: Orders Placed This Encounter  Procedures  .  XR Hand 2 View Right  . XR Hand 2 View Left  . XR Foot 2 Views Right  . XR Foot 2 Views Left   No orders of the defined types were placed in this encounter.   Face-to-face time spent with patient was 45 minutes.  Greater than 50% of time was spent in counseling and coordination of care.  Follow-Up Instructions: Return in about 1 year (around 10/21/2018) for Rheumatoid arthritis.   Pollyann Savoy, MD  Note - This record has been created using Animal nutritionist.  Chart creation errors have been sought, but may not always  have been located. Such creation errors do not reflect on  the standard of medical care.

## 2017-10-13 MED FILL — cloNIDine HCL 0.2 MG TABS: 0.2 | 90 days supply | Qty: 180 | Fill #1

## 2017-10-13 MED FILL — BRILINTA 90 MG TABLET: 90 | 30 days supply | Qty: 60 | Fill #1

## 2017-10-20 ENCOUNTER — Ambulatory Visit (INDEPENDENT_AMBULATORY_CARE_PROVIDER_SITE_OTHER): Payer: Self-pay

## 2017-10-20 ENCOUNTER — Ambulatory Visit: Payer: 59 | Admitting: Rheumatology

## 2017-10-20 ENCOUNTER — Encounter: Payer: Self-pay | Admitting: Rheumatology

## 2017-10-20 VITALS — BP 127/80 | HR 87 | Resp 16 | Ht 72.0 in | Wt 188.5 lb

## 2017-10-20 DIAGNOSIS — M25571 Pain in right ankle and joints of right foot: Secondary | ICD-10-CM

## 2017-10-20 DIAGNOSIS — I671 Cerebral aneurysm, nonruptured: Secondary | ICD-10-CM | POA: Diagnosis not present

## 2017-10-20 DIAGNOSIS — M2550 Pain in unspecified joint: Secondary | ICD-10-CM

## 2017-10-20 DIAGNOSIS — I10 Essential (primary) hypertension: Secondary | ICD-10-CM | POA: Diagnosis not present

## 2017-10-20 DIAGNOSIS — Z8673 Personal history of transient ischemic attack (TIA), and cerebral infarction without residual deficits: Secondary | ICD-10-CM

## 2017-10-20 DIAGNOSIS — M25572 Pain in left ankle and joints of left foot: Secondary | ICD-10-CM

## 2017-10-20 DIAGNOSIS — Z8719 Personal history of other diseases of the digestive system: Secondary | ICD-10-CM

## 2017-10-20 DIAGNOSIS — M25541 Pain in joints of right hand: Secondary | ICD-10-CM | POA: Diagnosis not present

## 2017-10-20 DIAGNOSIS — Z8709 Personal history of other diseases of the respiratory system: Secondary | ICD-10-CM

## 2017-10-20 DIAGNOSIS — Z87891 Personal history of nicotine dependence: Secondary | ICD-10-CM

## 2017-10-20 DIAGNOSIS — M25542 Pain in joints of left hand: Secondary | ICD-10-CM

## 2017-10-20 DIAGNOSIS — Z96641 Presence of right artificial hip joint: Secondary | ICD-10-CM | POA: Diagnosis not present

## 2017-10-20 DIAGNOSIS — R911 Solitary pulmonary nodule: Secondary | ICD-10-CM | POA: Diagnosis not present

## 2017-10-20 DIAGNOSIS — M87052 Idiopathic aseptic necrosis of left femur: Secondary | ICD-10-CM

## 2017-10-21 MED FILL — DALIRESP 500 MCG TABLET: 500 | 30 days supply | Qty: 30 | Fill #1

## 2017-10-21 MED FILL — SPIRIVA 18 MCG CP-HANDIHALE: 18 | 30 days supply | Qty: 30 | Fill #2

## 2017-10-29 ENCOUNTER — Encounter: Payer: Self-pay | Admitting: Medical

## 2017-10-29 ENCOUNTER — Ambulatory Visit (INDEPENDENT_AMBULATORY_CARE_PROVIDER_SITE_OTHER): Payer: 59 | Admitting: Medical

## 2017-10-29 VITALS — BP 128/78 | HR 84 | Temp 98.1°F | Resp 16 | Ht 72.0 in | Wt 187.0 lb

## 2017-10-29 DIAGNOSIS — Z1159 Encounter for screening for other viral diseases: Secondary | ICD-10-CM | POA: Diagnosis not present

## 2017-10-29 DIAGNOSIS — Z0000 Encounter for general adult medical examination without abnormal findings: Secondary | ICD-10-CM

## 2017-10-29 DIAGNOSIS — Z125 Encounter for screening for malignant neoplasm of prostate: Secondary | ICD-10-CM | POA: Diagnosis not present

## 2017-10-29 LAB — CBC WITH DIFFERENTIAL/PLATELET
BASOS ABS: 0.1 10*3/uL (ref 0.0–0.1)
BASOS PCT: 0.8 % (ref 0.0–3.0)
Eosinophils Absolute: 0.2 10*3/uL (ref 0.0–0.7)
Eosinophils Relative: 2.2 % (ref 0.0–5.0)
HEMATOCRIT: 38.8 % — AB (ref 39.0–52.0)
HEMOGLOBIN: 12.7 g/dL — AB (ref 13.0–17.0)
LYMPHS PCT: 20.7 % (ref 12.0–46.0)
Lymphs Abs: 2.2 10*3/uL (ref 0.7–4.0)
MCHC: 32.8 g/dL (ref 30.0–36.0)
MCV: 85.4 fl (ref 78.0–100.0)
MONOS PCT: 9.8 % (ref 3.0–12.0)
Monocytes Absolute: 1 10*3/uL (ref 0.1–1.0)
NEUTROS ABS: 6.9 10*3/uL (ref 1.4–7.7)
Neutrophils Relative %: 66.5 % (ref 43.0–77.0)
PLATELETS: 470 10*3/uL — AB (ref 150.0–400.0)
RBC: 4.54 Mil/uL (ref 4.22–5.81)
RDW: 14.2 % (ref 11.5–15.5)
WBC: 10.4 10*3/uL (ref 4.0–10.5)

## 2017-10-29 LAB — URINALYSIS, ROUTINE W REFLEX MICROSCOPIC
BILIRUBIN URINE: NEGATIVE
HGB URINE DIPSTICK: NEGATIVE
KETONES UR: NEGATIVE
LEUKOCYTES UA: NEGATIVE
NITRITE: NEGATIVE
RBC / HPF: NONE SEEN (ref 0–?)
Specific Gravity, Urine: 1.01 (ref 1.000–1.030)
Total Protein, Urine: NEGATIVE
UROBILINOGEN UA: 0.2 (ref 0.0–1.0)
Urine Glucose: NEGATIVE
pH: 7 (ref 5.0–8.0)

## 2017-10-29 LAB — LIPID PANEL
CHOL/HDL RATIO: 3
CHOLESTEROL: 176 mg/dL (ref 0–200)
HDL: 55.5 mg/dL (ref >39.00)
LDL Cholesterol: 100 mg/dL — ABNORMAL HIGH (ref 0–99)
NonHDL: 120.86
TRIGLYCERIDES: 106 mg/dL (ref 0.0–149.0)
VLDL: 21.2 mg/dL (ref 0.0–40.0)

## 2017-10-29 LAB — COMPREHENSIVE METABOLIC PANEL
ALBUMIN: 4.1 g/dL (ref 3.5–5.2)
ALK PHOS: 95 U/L (ref 39–117)
ALT: 16 U/L (ref 0–53)
AST: 17 U/L (ref 0–37)
BILIRUBIN TOTAL: 0.6 mg/dL (ref 0.2–1.2)
BUN: 16 mg/dL (ref 6–23)
CALCIUM: 10.3 mg/dL (ref 8.4–10.5)
CO2: 28 meq/L (ref 19–32)
Chloride: 101 mEq/L (ref 96–112)
Creatinine, Ser: 1.24 mg/dL (ref 0.40–1.50)
GFR: 63.79 mL/min (ref >60.00)
Glucose, Bld: 114 mg/dL — ABNORMAL HIGH (ref 70–99)
Potassium: 5.2 mEq/L — ABNORMAL HIGH (ref 3.5–5.1)
Sodium: 138 mEq/L (ref 135–145)
Total Protein: 7.4 g/dL (ref 6.0–8.3)

## 2017-10-29 LAB — PSA: PSA: 2.29 ng/mL (ref 0.10–4.00)

## 2017-10-29 NOTE — Patient Instructions (Addendum)
For you wellness exam today I have ordered cbc, cmp, lipid panel psa, hep c screening and ua.  Vaccine offered today psv 23  declined.(but please discuss with your pulmonologist)  Recommend exercise and healthy diet.  We will let you know lab results as they come in.  Follow up date appointment will be determined after lab review.    Preventive Care 40-64 Years, Male Preventive care refers to lifestyle choices and visits with your health care provider that can promote health and wellness. What does preventive care include?  A yearly physical exam. This is also called an annual well check.  Dental exams once or twice a year.  Routine eye exams. Ask your health care provider how often you should have your eyes checked.  Personal lifestyle choices, including: ? Daily care of your teeth and gums. ? Regular physical activity. ? Eating a healthy diet. ? Avoiding tobacco and drug use. ? Limiting alcohol use. ? Practicing safe sex. ? Taking low-dose aspirin every day starting at age 106. What happens during an annual well check? The services and screenings done by your health care provider during your annual well check will depend on your age, overall health, lifestyle risk factors, and family history of disease. Counseling Your health care provider may ask you questions about your:  Alcohol use.  Tobacco use.  Drug use.  Emotional well-being.  Home and relationship well-being.  Sexual activity.  Eating habits.  Work and work Statistician.  Screening You may have the following tests or measurements:  Height, weight, and BMI.  Blood pressure.  Lipid and cholesterol levels. These may be checked every 5 years, or more frequently if you are over 24 years old.  Skin check.  Lung cancer screening. You may have this screening every year starting at age 8 if you have a 30-pack-year history of smoking and currently smoke or have quit within the past 15 years.  Fecal  occult blood test (FOBT) of the stool. You may have this test every year starting at age 67.  Flexible sigmoidoscopy or colonoscopy. You may have a sigmoidoscopy every 5 years or a colonoscopy every 10 years starting at age 79.  Prostate cancer screening. Recommendations will vary depending on your family history and other risks.  Hepatitis C blood test.  Hepatitis B blood test.  Sexually transmitted disease (STD) testing.  Diabetes screening. This is done by checking your blood sugar (glucose) after you have not eaten for a while (fasting). You may have this done every 1-3 years.  Discuss your test results, treatment options, and if necessary, the need for more tests with your health care provider. Vaccines Your health care provider may recommend certain vaccines, such as:  Influenza vaccine. This is recommended every year.  Tetanus, diphtheria, and acellular pertussis (Tdap, Td) vaccine. You may need a Td booster every 10 years.  Varicella vaccine. You may need this if you have not been vaccinated.  Zoster vaccine. You may need this after age 84.  Measles, mumps, and rubella (MMR) vaccine. You may need at least one dose of MMR if you were born in 1957 or later. You may also need a second dose.  Pneumococcal 13-valent conjugate (PCV13) vaccine. You may need this if you have certain conditions and have not been vaccinated.  Pneumococcal polysaccharide (PPSV23) vaccine. You may need one or two doses if you smoke cigarettes or if you have certain conditions.  Meningococcal vaccine. You may need this if you have certain conditions.  Hepatitis  A vaccine. You may need this if you have certain conditions or if you travel or work in places where you may be exposed to hepatitis A.  Hepatitis B vaccine. You may need this if you have certain conditions or if you travel or work in places where you may be exposed to hepatitis B.  Haemophilus influenzae type b (Hib) vaccine. You may need  this if you have certain risk factors.  Talk to your health care provider about which screenings and vaccines you need and how often you need them. This information is not intended to replace advice given to you by your health care provider. Make sure you discuss any questions you have with your health care provider. Document Released: 08/18/2015 Document Revised: 04/10/2016 Document Reviewed: 05/23/2015 Elsevier Interactive Patient Education  [employer].

## 2017-10-29 NOTE — Progress Notes (Signed)
Subjective:    Patient ID: Derek Conner, male    DOB: 1961/03/25, 57 y.o.   MRN: [id number]  HPI   Pt in for CPE/wellness exam  He update me that his rt hip feels good post surgery.   Pt is working but light duty. Not exercising. Eating healthy. Pt quite smoking for 3 weeks. Then restarts 2-3 cigarettes a day now. Pulmonologist also gave some patches.   Pt is up to date on colonoscopy, tdap,  and hiv screen.  Due for psa screening.    Review of Systems  Constitutional: Negative for chills, fatigue and fever.  HENT: Negative for congestion, drooling, facial swelling, mouth sores, postnasal drip and sinus pressure.   Respiratory: Negative for apnea, cough, chest tightness, shortness of breath and wheezing.   Cardiovascular: Negative for chest pain and palpitations.  Gastrointestinal: Negative for abdominal distention, abdominal pain, blood in stool, constipation, diarrhea, rectal pain and vomiting.  Musculoskeletal: Negative for arthralgias, back pain, joint swelling, myalgias and neck stiffness.       Pt has hip pain and will get hip surgery in near future. He is going to see his surgeon this Friday.  Skin: Negative for rash.  Neurological: Negative for dizziness, seizures, speech difficulty, weakness, light-headedness and headaches.  Hematological: Negative for adenopathy. Does not bruise/bleed easily.  Psychiatric/Behavioral: Negative for behavioral problems.   Past Medical History:  Diagnosis Date  . Asthma   . CAP (community acquired pneumonia) 01/19/2016  . Chronic bronchitis (HCC)   . COPD (chronic obstructive pulmonary disease) (HCC)   . Emphysema lung (HCC)   . Emphysema of lung (HCC)   . GERD (gastroesophageal reflux disease)   . Hepatitis A infection ~ 1980  . High cholesterol   . Hypertension   . Pneumonia 1960s  . Rheumatoid arthritis (HCC)    "hands mostly"  ( 01/19/2016)  . Situational depression Dec 19, 2009   "only when my mother died"  . Stroke [facility])    mini stroke when doing embolization, 12/20/15, no deficits     Social History   Socioeconomic History  . Marital status: Married    Spouse name: Not on file  . Number of children: Not on file  . Years of education: Not on file  . Highest education level: Not on file  Occupational History  . Not on file  Social Needs  . Financial resource strain: Not on file  . Food insecurity:    Worry: Not on file    Inability: Not on file  . Transportation needs:    Medical: Not on file    Non-medical: Not on file  Tobacco Use  . Smoking status: Former Smoker    Packs/day: 0.50    Years: 40.00    Pack years: 20.00    Types: Cigarettes  . Smokeless tobacco: Never Used  Substance and Sexual Activity  . Alcohol use: Yes    Comment: 3 drinks liquor per day  . Drug use: Yes    Types: Marijuana    Comment: 1-2 times per week  . Sexual activity: Yes  Lifestyle  . Physical activity:    Days per week: Not on file    Minutes per session: Not on file  . Stress: Not on file  Relationships  . Social connections:    Talks on phone: Not on file    Gets together: Not on file    Attends religious service: Not on file    Active member of club or organization: Not on file  Attends meetings of clubs or organizations: Not on file    Relationship status: Not on file  . Intimate partner violence:    Fear of current or ex partner: Not on file    Emotionally abused: Not on file    Physically abused: Not on file    Forced sexual activity: Not on file  Other Topics Concern  . Not on file  Social History Narrative  . Not on file    Past Surgical History:  Procedure Laterality Date  . APPENDECTOMY  1980's  . CHEST TUBE INSERTION Left 1990's   "lung collapsed"  . CYSTECTOMY Left 1960's   "wrist"  . FOOT FRACTURE SURGERY Left ~ 2005   "it was crushed"  . IR GENERIC HISTORICAL  03/27/2016   IR ANGIOGRAM FOLLOW UP STUDY 03/27/2016 Julieanne Cotton, MD [facility]  . IR GENERIC HISTORICAL   03/27/2016   IR ANGIOGRAM FOLLOW UP STUDY 03/27/2016 Julieanne Cotton, MD [facility]  . IR GENERIC HISTORICAL  03/27/2016   IR ANGIO INTRA EXTRACRAN SEL COM CAROTID INNOMINATE UNI L MOD SED 03/27/2016 Julieanne Cotton, MD [facility]  . IR GENERIC HISTORICAL  03/27/2016   IR ANGIOGRAM FOLLOW UP STUDY 03/27/2016 Julieanne Cotton, MD [facility]  . IR GENERIC HISTORICAL  03/27/2016   IR ANGIO VERTEBRAL SEL SUBCLAVIAN INNOMINATE UNI R MOD SED 03/27/2016 Julieanne Cotton, MD [facility]  . IR GENERIC HISTORICAL  03/27/2016   IR 3D INDEPENDENT WKST 03/27/2016 Julieanne Cotton, MD [facility]  . IR GENERIC HISTORICAL  03/27/2016   IR ANGIOGRAM FOLLOW UP STUDY 03/27/2016 Julieanne Cotton, MD [facility]  . IR GENERIC HISTORICAL  03/27/2016   IR NEURO EACH ADD'L AFTER BASIC UNI RIGHT (MS) 03/27/2016 Julieanne Cotton, MD [facility]  . IR GENERIC HISTORICAL  03/27/2016   IR ANGIOGRAM FOLLOW UP STUDY 03/27/2016 Julieanne Cotton, MD [facility]  . IR GENERIC HISTORICAL  03/27/2016   IR TRANSCATH/EMBOLIZ 03/27/2016 Julieanne Cotton, MD [facility]  . IR GENERIC HISTORICAL  03/27/2016   IR ANGIO INTRA EXTRACRAN SEL INTERNAL CAROTID UNI R MOD SED 03/27/2016 Julieanne Cotton, MD [facility]  . IR GENERIC HISTORICAL  03/27/2016   IR ANGIOGRAM FOLLOW UP STUDY 03/27/2016 Julieanne Cotton, MD [facility]  . IR GENERIC HISTORICAL  04/12/2016   IR RADIOLOGIST EVAL & MGMT 04/12/2016 [facility]  . RADIOLOGY WITH ANESTHESIA N/A 02/29/2016   Procedure: EMBOLIZATION     (RADIOLOGY WITH ANESTHESIA);  Surgeon: Julieanne Cotton, MD;  Location: [facility] OR;  Service: Radiology;  Laterality: N/A;  . RADIOLOGY WITH ANESTHESIA N/A 03/27/2016   Procedure: Embolization;  Surgeon: Julieanne Cotton, MD;  Location: [facility] OR;  Service: Radiology;  Laterality: N/A;  . TOTAL HIP ARTHROPLASTY Right 09/23/2017  . TOTAL HIP ARTHROPLASTY Right 09/23/2017   Procedure: RIGHT TOTAL HIP ARTHROPLASTY ANTERIOR APPROACH;  Surgeon: Cammy Copa, MD;  Location: [facility] OR;  Service: Orthopedics;  Laterality: Right;  Marland Kitchen VIDEO BRONCHOSCOPY Bilateral 12/06/2013   Procedure: VIDEO BRONCHOSCOPY WITHOUT FLUORO;  Surgeon: Alyson Reedy, MD;  Location: [facility] ENDOSCOPY;  Service: Cardiopulmonary;  Laterality: Bilateral;    Family History  Problem Relation Age of Onset  . Cancer Mother   . Arthritis Mother   . Stroke Father   . Hypertension Father   . Healthy Daughter   . Healthy Son   . Colon cancer Neg Hx     Allergies  Allergen Reactions  . Symbicort [Budesonide-Formoterol Fumarate] Shortness Of Breath, Nausea And Vomiting and Other (See Comments)  heachache  . Dulera [Mometasone Furo-Formoterol Fum] Other (See Comments)    Headache   . Humira [Adalimumab] Nausea And Vomiting    Headaches     Current Outpatient Medications on File Prior to Visit  Medication Sig Dispense Refill  . acetaminophen (TYLENOL) 500 MG tablet Take 1,000 mg by mouth daily as needed for moderate pain or headache.    . albuterol (PROVENTIL HFA;VENTOLIN HFA) 108 (90 Base) MCG/ACT inhaler Inhale 2 puffs into the lungs every 4 (four) hours as needed for wheezing or shortness of breath.    Marland Kitchen albuterol (PROVENTIL) (2.5 MG/3ML) 0.083% nebulizer solution Take 3 mLs (2.5 mg total) by nebulization every 6 (six) hours as needed for wheezing or shortness of breath. DX: J44.9 75 mL 12  . ALPRAZolam (XANAX) 0.5 MG tablet 1 tab po q day as needed severe anxiety/panic attack. (Patient taking differently: Take 0.25-0.5 mg by mouth daily as needed for anxiety. 1 tab po q day as needed severe anxiety/panic attack.) 30 tablet 0  . amLODipine (NORVASC) 10 MG tablet TAKE 1 TABLET (10 MG) BY MOUTH DAILY. (Patient taking differently: TAKE 1 TABLET (10 MG) BY MOUTH DAILY AT NIGHT) 90 tablet 3  . aspirin EC 81 MG tablet Take 325 mg by mouth daily.     . cloNIDine (CATAPRES) 0.2 MG tablet Take 1 tablet (0.2 mg total) by mouth 2 (two) times daily. 60 tablet 11  . DALIRESP 500  MCG TABS tablet TAKE 1 TABLET (500 MCG TOTAL) BY MOUTH DAILY. 30 tablet 6  . famotidine (PEPCID) 20 MG tablet TAKE 1 TABLET (20 MG TOTAL) BY MOUTH AT BEDTIME. 90 tablet 1  . fenofibrate (TRICOR) 48 MG tablet TAKE 1 TABLET (48 MG TOTAL) BY MOUTH DAILY. (Patient taking differently: Take 48 mg by mouth at bedtime. ) 30 tablet 3  . Fluticasone-Salmeterol (ADVAIR DISKUS) 500-50 MCG/DOSE AEPB Inhale 1 puff into the lungs 2 (two) times daily. 60 each 3  . Lactobacillus (ACIDOPHILUS PO) Take by mouth. OTC, unsure of dose    . methocarbamol (ROBAXIN) 500 MG tablet Take 1 tablet (500 mg total) by mouth every 8 (eight) hours as needed for muscle spasms. 30 tablet 0  . pantoprazole (PROTONIX) 40 MG tablet Take 1 tablet (40 mg total) by mouth daily at 12 noon. 30 tablet 3  . ticagrelor (BRILINTA) 90 MG TABS tablet Take 45-90 mg by mouth See admin instructions. Take 90 mg in the morning and 45 mg at night    . tiotropium (SPIRIVA HANDIHALER) 18 MCG inhalation capsule PLACE 1 CAPSULE INTO INHALER AND INHALE INTO LUNGS DAILY. 90 capsule 3   Current Facility-Administered Medications on File Prior to Visit  Medication Dose Route Frequency Provider Last Rate Last Dose  . 0.9 %  sodium chloride infusion   Intravenous Continuous Ralene Muskrat, PA-C      . aspirin EC tablet 325 mg  325 mg Oral 60 min Pre-Op Ralene Muskrat, PA-C      . niMODipine (NIMOTOP) capsule 0-60 mg  0-60 mg Oral 60 min Pre-Op Ralene Muskrat, PA-C        BP 128/78   Pulse 84   Temp 98.1 F (36.7 C) (Oral)   Resp 16   Ht 6' (1.829 m)   Wt 187 lb (84.8 kg)   SpO2 100%   BMI 25.36 kg/m      Objective:   Physical Exam  General Mental Status- Alert. General Appearance- Not in acute distress.   Skin General: Color- Normal Color. Moisture-  Normal Moisture.  Upon inspection of the his skin no worrisome lesions seen.  Neck Carotid Arteries- Normal color. Moisture- Normal Moisture. No carotid bruits. No JVD.  Chest and Lung  Exam Auscultation: Breath Sounds:-Normal.  Cardiovascular Auscultation:Rythm- Regular. Murmurs & Other Heart Sounds:Auscultation of the heart reveals- No Murmurs.  Abdomen Inspection:-Inspeection Normal. Palpation/Percussion:Note:No mass. Palpation and Percussion of the abdomen reveal- Non Tender, Non Distended + BS, no rebound or guarding.   Neurologic Cranial Nerve exam:- CN III-XII intact(No nystagmus), symmetric smile. Strength:- 5/5 equal and symmetric strength both upper and lower extremities.  Genital exam and rectal exam- pt declined again this year.        Assessment & Plan:  For you wellness exam today I have ordered cbc, cmp, lipid panel psa, hep c screening and ua.  Vaccine offered today psv 23  declined.(but please discuss with your pulmonologist)  Recommend exercise and healthy diet.  We will let you know lab results as they come in.  Follow up date appointment will be determined after lab review.   Esperanza Richters, PA-C

## 2017-10-30 ENCOUNTER — Other Ambulatory Visit (INDEPENDENT_AMBULATORY_CARE_PROVIDER_SITE_OTHER): Payer: 59

## 2017-10-30 ENCOUNTER — Telehealth (INDEPENDENT_AMBULATORY_CARE_PROVIDER_SITE_OTHER): Payer: Self-pay | Admitting: Orthopedic Surgery

## 2017-10-30 DIAGNOSIS — R739 Hyperglycemia, unspecified: Secondary | ICD-10-CM | POA: Diagnosis not present

## 2017-10-30 LAB — HEPATITIS C ANTIBODY
HEP C AB: NONREACTIVE
SIGNAL TO CUT-OFF: 0.05 (ref <1.00)

## 2017-10-30 LAB — HEMOGLOBIN A1C: HEMOGLOBIN A1C: 5.9 % (ref 4.6–6.5)

## 2017-10-30 MED ORDER — OXYCODONE HCL 5 MG PO TABS: ORAL_TABLET | ORAL | 0 refills | Status: DC

## 2017-10-30 MED FILL — oxyCODONE HCL 5 MG TABS: 5 | 10 days supply | Qty: 30 | Fill #0

## 2017-10-30 NOTE — Telephone Encounter (Signed)
Patient called stating that he is in a lot of pain and wanted to see Dr. August Saucer earlier.  Patient has an appointment with Dr. August Saucer tomorrow, but states he does not think he can wait.  He also requested for Dr. August Saucer to call him in something for pain.  CB#[phone].  Thank you

## 2017-10-30 NOTE — Telephone Encounter (Signed)
IC patient and he states that he has been in severe left hip pain for over a week but much more intense in the last 3 days.  He cannot sleep, cannot get comfortable. He is having some LBP with radicular left anterior leg pain, some groin pain, denies numbness tingling. He has an appt to see you tomorrow but wanted to know what he could do to make it through til his appt. Requesting something for pain. I advised you were in surgery today and if he felt he could not hold off until his appt tomorrow could go to ER or urgent care. I offered him an appt to be worked in to see Dr Roda Shutters this a.m. But he refused.

## 2017-10-30 NOTE — Telephone Encounter (Signed)
IC advised could pick up 

## 2017-10-30 NOTE — Telephone Encounter (Signed)
Ok for oxycodone 5 mg po q 8 # 30 pls clal thx

## 2017-10-31 ENCOUNTER — Ambulatory Visit (INDEPENDENT_AMBULATORY_CARE_PROVIDER_SITE_OTHER): Payer: 59

## 2017-10-31 ENCOUNTER — Ambulatory Visit (INDEPENDENT_AMBULATORY_CARE_PROVIDER_SITE_OTHER): Payer: 59 | Admitting: Orthopedic Surgery

## 2017-10-31 ENCOUNTER — Encounter (INDEPENDENT_AMBULATORY_CARE_PROVIDER_SITE_OTHER): Payer: Self-pay | Admitting: Orthopedic Surgery

## 2017-10-31 DIAGNOSIS — M1612 Unilateral primary osteoarthritis, left hip: Secondary | ICD-10-CM

## 2017-10-31 DIAGNOSIS — M25552 Pain in left hip: Secondary | ICD-10-CM

## 2017-11-01 ENCOUNTER — Encounter (INDEPENDENT_AMBULATORY_CARE_PROVIDER_SITE_OTHER): Payer: Self-pay | Admitting: Orthopedic Surgery

## 2017-11-01 NOTE — Progress Notes (Signed)
Post-Op Visit Note   Patient: Derek Conner           Date of Birth: 08/02/1961           MRN: [id number] Visit Date: 10/31/2017 PCP: Esperanza Richters, PA-C   Assessment & Plan:  Chief Complaint:  Chief Complaint  Patient presents with  . Right Hip - Follow-up  . Left Hip - Pain   Visit Diagnoses:  1. Unilateral primary osteoarthritis, left hip   2. Pain in left hip     Plan: [name] is now about 6 weeks out from right total hip replacement.  He has been doing well with his right total hip replacement.  He is reporting fairly incapacitating left hip pain.  He has known avascular necrosis of the left hip from MRI scan done last year.  Last several days he has developed severe left hip pain.  Reports groin pain and anterior thigh pain with fairly minimal low back pain.  Denies any numbness and tingling in the leg.  Does not report any popping or grinding in the hip.  His pain did require him to receive a prescription for oxycodone yesterday.  He has been back at work.  Radiographs are reviewed with the patient and it does show some slight collapse on the left-hand side consistent with his known history of left hip avascular necrosis.  On examination he has a lot of pain with attempted groin range of motion.  He has no nerve root tension signs and good ankle dorsiflexion plantarflexion quad and hamstring strength on the left-hand side.  Hip flexion is present but it is painful and a little bit weak on the left.  Right hip has improving strength and good painless range of motion.  Impression is left hip avascular necrosis.  Plan is total hip replacement.  He has been through this before with the right hip.  He feels good about his recovery on the right hip.  His pain is not quite at the level of the right hip but it is approaching that.  The risk and benefits of hip replacement are discussed including but not limited to infection nerve vessel damage leg length inequality and  dislocation.  Patient understands the risk and benefits and wishes to proceed.  All questions answered.  Follow-Up Instructions: No follow-ups on file.   Orders:  Orders Placed This Encounter  Procedures  . XR HIP UNILAT W OR W/O PELVIS 2-3 VIEWS LEFT   No orders of the defined types were placed in this encounter.   Imaging: No results found.  PMFS History: Patient Active Problem List   Diagnosis Date Noted  . 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 Nocturnal hypoxemia 04/03/2017  . Fatigue 01/09/2017  . Thrush 12/12/2016  . Brain aneurysm 03/27/2016  . Preop respiratory exam 03/26/2016  . Aneurysm (HCC)   . Chronic obstructive pulmonary disease with acute exacerbation (HCC) 01/19/2016  . Community acquired pneumonia 01/19/2016  . Cough syncope 01/19/2016  . Wellness examination 08/29/2015  . Hyperglycemia 01/06/2015  . HTN (hypertension) 12/09/2014  . COPD exacerbation (HCC) 07/08/2014  . Atypical chest pain 07/08/2014  . 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 Dry mouth 12/26/2013  . Cigarette smoker 12/26/2013  . Nodule of left lung 12/26/2013  . COPD, severity to be determined (HCC) 12/26/2013  . Pulmonary nodule, right 12/06/2013  . Hemoptysis 12/03/2013  . COPD (chronic obstructive pulmonary disease) (HCC) 12/03/2013  . Acute bronchitis 12/03/2013  . Tobacco use disorder 12/03/2013   Past Medical  History:  Diagnosis Date  . Asthma   . CAP (community acquired pneumonia) 01/19/2016  . Chronic bronchitis (HCC)   . COPD (chronic obstructive pulmonary disease) (HCC)   . Emphysema lung (HCC)   . Emphysema of lung (HCC)   . GERD (gastroesophageal reflux disease)   . Hepatitis A infection ~ 1980  . High cholesterol   . Hypertension   . Pneumonia 1960s  . Rheumatoid arthritis (HCC)    "hands mostly"  ( 01/19/2016)  . Situational depression 12-03-09   "only when my mother died"  . Stroke [facility])    mini stroke when doing embolization, 12/04/15, no deficits    Family History  Problem  Relation Age of Onset  . Cancer Mother   . Arthritis Mother   . Stroke Father   . Hypertension Father   . Healthy Daughter   . Healthy Son   . Colon cancer Neg Hx     Past Surgical History:  Procedure Laterality Date  . APPENDECTOMY  1980's  . CHEST TUBE INSERTION Left 1990's   "lung collapsed"  . CYSTECTOMY Left 1960's   "wrist"  . FOOT FRACTURE SURGERY Left ~ 12-04-03   "it was crushed"  . IR GENERIC HISTORICAL  03/27/2016   IR ANGIOGRAM FOLLOW UP STUDY 03/27/2016 Julieanne Cotton, MD [facility]  . IR GENERIC HISTORICAL  03/27/2016   IR ANGIOGRAM FOLLOW UP STUDY 03/27/2016 Julieanne Cotton, MD [facility]  . IR GENERIC HISTORICAL  03/27/2016   IR ANGIO INTRA EXTRACRAN SEL COM CAROTID INNOMINATE UNI L MOD SED 03/27/2016 Julieanne Cotton, MD [facility]  . IR GENERIC HISTORICAL  03/27/2016   IR ANGIOGRAM FOLLOW UP STUDY 03/27/2016 Julieanne Cotton, MD [facility]  . IR GENERIC HISTORICAL  03/27/2016   IR ANGIO VERTEBRAL SEL SUBCLAVIAN INNOMINATE UNI R MOD SED 03/27/2016 Julieanne Cotton, MD [facility]  . IR GENERIC HISTORICAL  03/27/2016   IR 3D INDEPENDENT WKST 03/27/2016 Julieanne Cotton, MD [facility]  . IR GENERIC HISTORICAL  03/27/2016   IR ANGIOGRAM FOLLOW UP STUDY 03/27/2016 Julieanne Cotton, MD [facility]  . IR GENERIC HISTORICAL  03/27/2016   IR NEURO EACH ADD'L AFTER BASIC UNI RIGHT (MS) 03/27/2016 Julieanne Cotton, MD [facility]  . IR GENERIC HISTORICAL  03/27/2016   IR ANGIOGRAM FOLLOW UP STUDY 03/27/2016 Julieanne Cotton, MD [facility]  . IR GENERIC HISTORICAL  03/27/2016   IR TRANSCATH/EMBOLIZ 03/27/2016 Julieanne Cotton, MD [facility]  . IR GENERIC HISTORICAL  03/27/2016   IR ANGIO INTRA EXTRACRAN SEL INTERNAL CAROTID UNI R MOD SED 03/27/2016 Julieanne Cotton, MD [facility]  . IR GENERIC HISTORICAL  03/27/2016   IR ANGIOGRAM FOLLOW UP STUDY 03/27/2016 Julieanne Cotton, MD [facility]  . IR GENERIC HISTORICAL  04/12/2016   IR RADIOLOGIST EVAL &  MGMT 04/12/2016 [facility]  . RADIOLOGY WITH ANESTHESIA N/A 02/29/2016   Procedure: EMBOLIZATION     (RADIOLOGY WITH ANESTHESIA);  Surgeon: Julieanne Cotton, MD;  Location: [facility] OR;  Service: Radiology;  Laterality: N/A;  . RADIOLOGY WITH ANESTHESIA N/A 03/27/2016   Procedure: Embolization;  Surgeon: Julieanne Cotton, MD;  Location: [facility] OR;  Service: Radiology;  Laterality: N/A;  . TOTAL HIP ARTHROPLASTY Right 09/23/2017  . TOTAL HIP ARTHROPLASTY Right 09/23/2017   Procedure: RIGHT TOTAL HIP ARTHROPLASTY ANTERIOR APPROACH;  Surgeon: Cammy Copa, MD;  Location: [facility] OR;  Service: Orthopedics;  Laterality: Right;  Marland Kitchen VIDEO BRONCHOSCOPY Bilateral 12/06/2013   Procedure: VIDEO BRONCHOSCOPY WITHOUT FLUORO;  Surgeon: Alyson Reedy, MD;  Location: [facility] ENDOSCOPY;  Service: Cardiopulmonary;  Laterality: Bilateral;   Social History   Occupational History  . Not on file  Tobacco Use  . Smoking status: Former Smoker    Packs/day: 0.50    Years: 40.00    Pack years: 20.00    Types: Cigarettes  . Smokeless tobacco: Never Used  Substance and Sexual Activity  . Alcohol use: Yes    Comment: 3 drinks liquor per day  . Drug use: Yes    Types: Marijuana    Comment: 1-2 times per week  . Sexual activity: Yes

## 2017-11-04 ENCOUNTER — Other Ambulatory Visit: Payer: Self-pay | Admitting: Internal Medicine

## 2017-11-05 DIAGNOSIS — J441 Chronic obstructive pulmonary disease with (acute) exacerbation: Secondary | ICD-10-CM | POA: Diagnosis not present

## 2017-11-05 DIAGNOSIS — J449 Chronic obstructive pulmonary disease, unspecified: Secondary | ICD-10-CM | POA: Diagnosis not present

## 2017-11-05 DIAGNOSIS — R0902 Hypoxemia: Secondary | ICD-10-CM | POA: Diagnosis not present

## 2017-11-05 MED FILL — ADVAIR 500/50 DISKUS: 500-50 | 30 days supply | Qty: 60 | Fill #0

## 2017-11-07 ENCOUNTER — Other Ambulatory Visit (INDEPENDENT_AMBULATORY_CARE_PROVIDER_SITE_OTHER): Payer: Self-pay | Admitting: Orthopedic Surgery

## 2017-11-07 DIAGNOSIS — M87052 Idiopathic aseptic necrosis of left femur: Secondary | ICD-10-CM

## 2017-11-10 ENCOUNTER — Other Ambulatory Visit (INDEPENDENT_AMBULATORY_CARE_PROVIDER_SITE_OTHER): Payer: Self-pay

## 2017-11-10 ENCOUNTER — Other Ambulatory Visit: Payer: Self-pay | Admitting: Medical

## 2017-11-10 MED FILL — FLUTICASONE PROP 50 MCG SPR: 50 | 30 days supply | Qty: 16 | Fill #1

## 2017-11-10 MED FILL — ALBUTEROL 0.083% INHAL SOLN: (2.5 MG/3ML | 6 days supply | Qty: 75 | Fill #5

## 2017-11-10 MED FILL — FENOFIBRATE 48 MG TABLET: 48 | 30 days supply | Qty: 30 | Fill #1

## 2017-11-10 MED FILL — VENTOLIN HFA 90 MCG INHALER: 108 (90 BAS | 75 days supply | Qty: 54 | Fill #3

## 2017-11-11 ENCOUNTER — Telehealth: Payer: Self-pay | Admitting: Student

## 2017-11-11 ENCOUNTER — Telehealth: Payer: Self-pay | Admitting: Radiology

## 2017-11-11 MED FILL — BRILINTA 90 MG TABLET: 90 | 30 days supply | Qty: 60 | Fill #0

## 2017-11-11 NOTE — Progress Notes (Signed)
ERROR

## 2017-11-11 NOTE — Progress Notes (Signed)
   Refill Brilinta 90  mg #60 3 refills per Dr Corliss Skains  Faxed to [facility]

## 2017-11-12 NOTE — Progress Notes (Signed)
PCP: Esperanza Richters, MD  Cardiologist:  EKG: 06/23/17  Stress test:  ECHO: 01/23/16  Cardiac Cath:  Chest x-ray:06/22/17

## 2017-11-12 NOTE — Pre-Procedure Instructions (Signed)
Derek Conner  11/12/2017      [facility] - [place], [place] - [address] [place] [address] Phone: [phone] Fax: [phone]    Your procedure is scheduled on Wednesday, April 24th   Report to [facility] Admitting at 10:45AM             (posted surgery time 12:45p - 3:56p)   Call this number if you have problems the morning of surgery:  [phone]   Remember:                        4-5 days prior to surgery, STOP TAKING any Vitamins, Herbal Supplements, Anti-inflammatories, blood thinners.   Do not eat food or drink liquids after midnight, Tuesday.   Take these medicines the morning of surgery with A SIP OF WATER : Amlodipine, Clonidine, Oxycodone.  Please use your inhalers and nebulizer solution that morning.                  Stop Taking your Brilanta on _________________________   Do not wear jewelry - no rings or watches.  Do not wear lotions, colognes or deodorant.   Men may shave face and neck.  Do not bring valuables to the hospital.  [facility] is not responsible for any belongings or valuables.  Contacts, dentures or bridgework may not be worn into surgery.  Leave your suitcase in the car.  After surgery it may be brought to your room.  For patients admitted to the hospital, discharge time will be determined by your treatment team.  Please read over the following fact sheets that you were given. Pain Booklet, MRSA Information and Surgical Site Infection Prevention      [place]- Preparing For Surgery  Before surgery, you can play an important role. Because skin is not sterile, your skin needs to be as free of germs as possible. You can reduce the number of germs on your skin by washing with CHG (chlorahexidine gluconate) Soap before surgery.  CHG is an antiseptic cleaner which kills germs and bonds with the skin to continue killing germs even after  washing.  Please do not use if you have an allergy to CHG or antibacterial soaps. If your skin becomes reddened/irritated stop using the CHG.  Do not shave (including legs and underarms) for at least 48 hours prior to first CHG shower. It is OK to shave your face.  Please follow these instructions carefully.   1. Shower the NIGHT BEFORE SURGERY and the MORNING OF SURGERY with CHG.   2. If you chose to wash your hair, wash your hair first as usual with your normal shampoo.  3. After you shampoo, rinse your hair and body thoroughly to remove the shampoo.  4. Use CHG as you would any other liquid soap. You can apply CHG directly to the skin and wash gently with a scrungie or a clean washcloth.   5. Apply the CHG Soap to your body ONLY FROM THE NECK DOWN.  Do not use on open wounds or open sores. Avoid contact with your eyes, ears, mouth and genitals (private parts). Wash Face and genitals (private parts)  with your normal soap.  6. Wash thoroughly, paying special attention to the area where your surgery will be performed.  7. Thoroughly rinse your body with warm water from the neck down.  8. DO NOT shower/wash with  your normal soap after using and rinsing off the CHG Soap.  9. Pat yourself dry with a CLEAN TOWEL.  10. Wear CLEAN PAJAMAS to bed the night before surgery, wear comfortable clothes the morning of surgery  11. Place CLEAN SHEETS on your bed the night of your first shower and DO NOT SLEEP WITH PETS.    Day of Surgery: Do not apply any deodorants/lotions. Please wear clean clothes to the hospital/surgery center.

## 2017-11-13 ENCOUNTER — Encounter (HOSPITAL_COMMUNITY): Payer: Self-pay

## 2017-11-13 ENCOUNTER — Other Ambulatory Visit: Payer: Self-pay

## 2017-11-13 ENCOUNTER — Encounter (HOSPITAL_COMMUNITY)
Admission: RE | Admit: 2017-11-13 | Discharge: 2017-11-13 | Disposition: A | Discharge status: Home / Self Care | Payer: 59 | Source: Ambulatory Visit | Attending: Orthopedic Surgery | Admitting: Orthopedic Surgery

## 2017-11-13 DIAGNOSIS — F17210 Nicotine dependence, cigarettes, uncomplicated: Secondary | ICD-10-CM | POA: Insufficient documentation

## 2017-11-13 DIAGNOSIS — Z8679 Personal history of other diseases of the circulatory system: Secondary | ICD-10-CM | POA: Insufficient documentation

## 2017-11-13 DIAGNOSIS — Z7982 Long term (current) use of aspirin: Secondary | ICD-10-CM | POA: Diagnosis not present

## 2017-11-13 DIAGNOSIS — Z01812 Encounter for preprocedural laboratory examination: Secondary | ICD-10-CM | POA: Diagnosis not present

## 2017-11-13 DIAGNOSIS — Z79899 Other long term (current) drug therapy: Secondary | ICD-10-CM | POA: Insufficient documentation

## 2017-11-13 DIAGNOSIS — K219 Gastro-esophageal reflux disease without esophagitis: Secondary | ICD-10-CM | POA: Insufficient documentation

## 2017-11-13 DIAGNOSIS — J449 Chronic obstructive pulmonary disease, unspecified: Secondary | ICD-10-CM | POA: Diagnosis not present

## 2017-11-13 DIAGNOSIS — I10 Essential (primary) hypertension: Secondary | ICD-10-CM | POA: Insufficient documentation

## 2017-11-13 HISTORY — DX: Cerebral aneurysm, nonruptured: I67.1

## 2017-11-13 LAB — CBC
HCT: 40 % (ref 39.0–52.0)
Hemoglobin: 12.6 g/dL — ABNORMAL LOW (ref 13.0–17.0)
MCH: 27.6 pg (ref 26.0–34.0)
MCHC: 31.5 g/dL (ref 30.0–36.0)
MCV: 87.5 fL (ref 78.0–100.0)
PLATELETS: 547 10*3/uL — AB (ref 150–400)
RBC: 4.57 MIL/uL (ref 4.22–5.81)
RDW: 14 % (ref 11.5–15.5)
WBC: 8.3 10*3/uL (ref 4.0–10.5)

## 2017-11-13 LAB — BASIC METABOLIC PANEL
Anion gap: 11 (ref 5–15)
BUN: 11 mg/dL (ref 6–20)
CO2: 22 mmol/L (ref 22–32)
CREATININE: 1.21 mg/dL (ref 0.61–1.24)
Calcium: 9.4 mg/dL (ref 8.9–10.3)
Chloride: 103 mmol/L (ref 101–111)
GFR calc Af Amer: >60 mL/min (ref >60)
Glucose, Bld: 108 mg/dL — ABNORMAL HIGH (ref 65–99)
Potassium: 4.2 mmol/L (ref 3.5–5.1)
Sodium: 136 mmol/L (ref 135–145)

## 2017-11-13 LAB — TYPE AND SCREEN
ABO/RH(D): O POS
ANTIBODY SCREEN: NEGATIVE

## 2017-11-13 LAB — URINALYSIS, ROUTINE W REFLEX MICROSCOPIC
BILIRUBIN URINE: NEGATIVE
GLUCOSE, UA: NEGATIVE mg/dL
Hgb urine dipstick: NEGATIVE
KETONES UR: NEGATIVE mg/dL
Leukocytes, UA: NEGATIVE
Nitrite: NEGATIVE
Protein, ur: NEGATIVE mg/dL
Specific Gravity, Urine: 1.024 (ref 1.005–1.030)
pH: 6 (ref 5.0–8.0)

## 2017-11-13 LAB — SURGICAL PCR SCREEN
MRSA, PCR: NEGATIVE
STAPHYLOCOCCUS AUREUS: NEGATIVE

## 2017-11-13 NOTE — Progress Notes (Signed)
PCP: Malvin Johns, PA-C Cardiologist: Denies  EKG: 06/22/2017 CXR: 06/22/2017 ECHO: 01/23/2016 Stress Test: Denies Cardiac Cath: Denies  Pt reports he uses oxygen at night as prescribed after July 2018 sleep study. Reports it's supposed to be 2L via [place] only at night, but pt only able to tolerate it for 1 hour nightly.  Instructed pt to STOP smoking 24 hours prior to procedure.   Pt on Brilinta managed by Dr. Julieanne Cotton. Pt instructed to call MD to receive instructions when to stop--verbalized understanding.  For Feb 2019 right hip surgery, pt reports he was told to stop Brinlinta 5 days prior to surgery and take Aspirin 325mg . Pt will call MD to confirm instructions for this upcoming surgery.  Patient denies shortness of breath, fever, cough, and chest pain at PAT appointment.  Patient verbalized understanding of instructions provided today at the PAT appointment.  Patient asked to review instructions at home and day of surgery.

## 2017-11-14 ENCOUNTER — Telehealth: Payer: Self-pay | Admitting: Student

## 2017-11-14 LAB — URINE CULTURE: Culture: NO GROWTH

## 2017-11-14 NOTE — Progress Notes (Signed)
Anesthesia Chart Review:  Pt is a 57 year old male scheduled for L total hip arthroplasty anterior approach on 11/26/2017 with Cammy Copa, MD  - PCP is Esperanza Richters, PA - Pulmonologist is Kalman Shan, MD   PMH includes: HTN, hyperlipidemia, cerebral aneurysm (s/p R MCA trifurcation region aneurysm embolization 03/27/16), "mini stroke" (experienced L hand weakness and dis-coordination post-aneurysm embolization that resolved prior to discharge), emphysema, COPD (2L O2 at night) asthma, hepatitis A (1980), RA, GERD.  Current smoker.  BMI 25. S/p R THA 09/23/17  Medications include: Albuterol, amlodipine, ASA 81 mg, clonidine, Daliresp, fenofibrate, Advair, Brilinta, Spiriva. Pt to hold brilinta for 5 days before surgery and change ASA dose to 325mg  daily.   BP 136/89   Pulse 84   Temp 36.7 C   Resp 18   Ht 6' (1.829 m)   Wt 186 lb 14.4 oz (84.8 kg)   SpO2 100%   BMI 25.35 kg/m     Preoperative labs reviewed.    CT chest 08/28/17:  1. There is again a waxing and waning pattern of micro and macro nodularity in this patient. 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 This is proved repeatedly problematic from a screening perspective, and this patient is no longer recommended to receive annual lung cancer screening chest CT due to the high risk of false positivity. Outpatient evaluation by pulmonology and treatment for presumed chronic atypical infection is recommended. Lung-RADS 0, incomplete (examination considered nondiagnostic for above reasons). 2. Diffuse bronchial wall thickening with moderate centrilobular and paraseptal emphysema; imaging findings suggestive of underlying COPD. 3. Unusual appearance of the upper pole of the left kidney where there is an area of intermediate attenuation. Further evaluation with nonemergent renal ultrasound is recommended  in the near future to exclude the presence of a lesion. 4. Aortic atherosclerosis. 5. Hepatic steatosis.  CXR 06/22/17:  - COPD. Biapical nodular densities, likely scarring. No active disease.  EKG 06/22/17: NSR. LVH. ST elevation, consider early repolarization  Echo 01/23/16:  - Left ventricle: The cavity size was normal. There was mildconcentric hypertrophy. Systolic function was normal. Theestimated ejection fraction was in the range of 55% to 60%. Wallmotion was normal; there were no regional wall motionabnormalities. Left ventricular diastolic function parameterswere normal. - Aortic valve: Transvalvular velocity was within the normal range.There was no stenosis. There was no regurgitation. - Mitral valve: Transvalvular velocity was within the normal range.There was no evidence for stenosis. There was no regurgitation. - Left atrium: The atrium was moderately dilated. - Right ventricle: The cavity size was normal. Wall thickness wasnormal. Systolic function was normal. - Atrial septum: No defect or patent foramen ovale was identifiedby color flow Doppler. - Tricuspid valve: There was no regurgitation.  If no changes, I anticipate pt can proceed with surgery as scheduled.   01/25/16, FNP-BC [facility] Stay Surgical Center/Anesthesiology Phone: [phone] 11/14/2017 5:06 PM

## 2017-11-14 NOTE — Telephone Encounter (Signed)
Received message regarding patient's Brilinta prescription. Hx right MCA aneurysm embolization s/p embolization 03/27/2018. Patient is currently prescribed Brilinta 90 mg once daily in AM, Brilinta 45 mg once daily in PM, and Aspirin 325 mg once daily.  Called patient's wife Lurena Joiner. Wife states that patient is planned for a left hip replacement 11/14/2017. She was curious if patient is able to stop his Brilinta medication for this procedure.  Discussed case with Dr. Corliss Skains.  Informed wife that, per Dr. Corliss Skains, patient can stop his Brilinta 5 days prior to procedure (stop 11/21/2017). Instructed wife that during those 5 days, patient is to take Aspirin 325 once daily.  Informed wife that Dr. Corliss Skains recommends patient have MRI/MRA brain as soon as possible to see if patient can discontinue Brilinta use permanently. Patient's wife agrees.  After further discussion with Dr. Corliss Skains, he now feels best that patient should have a diagnostic cerebral angiogram to see if patient can discontinue Brilinta use permanently. I explained this to wife who agrees and asks that I call her husband to make sure it is okay with him.  Called patient to inform on plans listed above.  All questions answered and concerns addressed. Patient and wife convey understanding and agree with plan.  Waylan Boga Alyxander Kollmann, PA-C 11/14/2017, 3:45 PM

## 2017-11-14 NOTE — Progress Notes (Unsigned)
  Pt with previous embolization: 03/27/2016 Status post endovascular near complete obliteration of the right middle cerebral artery region aneurysm with primary coiling.  Scheduled for Hip replacement 11/26/2017 Taking Brilinta 90 mg am and 45 mg in pm  Discussed with Dr Corliss Skains Can come off Brilinta 5 days before surgery and take ASA 325 mg daily.  Plan for MRI/MRA Brain asap--- May be able to stay off Brilinta dependent on MRI/MRA results   Wife aware of plan-- Agreeable   Will hear from scheduler for appt time and date for MR

## 2017-11-17 ENCOUNTER — Ambulatory Visit (INDEPENDENT_AMBULATORY_CARE_PROVIDER_SITE_OTHER): Payer: 59 | Admitting: Orthopedic Surgery

## 2017-11-17 ENCOUNTER — Telehealth (HOSPITAL_COMMUNITY): Payer: Self-pay

## 2017-11-17 MED FILL — PANTOPRAZOLE SOD DR 40 MG T: 40 | 90 days supply | Qty: 90 | Fill #0

## 2017-11-17 NOTE — Telephone Encounter (Signed)
Refill of pantoprazole sent to pharmacy

## 2017-11-17 NOTE — Telephone Encounter (Signed)
Called to schedule 6 month f/u angio, no answer, left vm. AW

## 2017-11-18 DIAGNOSIS — H5213 Myopia, bilateral: Secondary | ICD-10-CM | POA: Diagnosis not present

## 2017-11-18 DIAGNOSIS — H52223 Regular astigmatism, bilateral: Secondary | ICD-10-CM | POA: Diagnosis not present

## 2017-11-19 ENCOUNTER — Other Ambulatory Visit (HOSPITAL_COMMUNITY): Payer: Self-pay | Admitting: Interventional Radiology

## 2017-11-19 DIAGNOSIS — I729 Aneurysm of unspecified site: Secondary | ICD-10-CM

## 2017-11-21 MED FILL — SPIRIVA 18 MCG CP-HANDIHALE: 18 | 30 days supply | Qty: 30 | Fill #3

## 2017-11-25 MED ORDER — TRANEXAMIC ACID 1000 MG/10ML IV SOLN
1000.0000 mg | INTRAVENOUS | Status: DC
  Filled 2017-11-25: qty 10

## 2017-11-26 ENCOUNTER — Inpatient Hospital Stay (HOSPITAL_COMMUNITY): Payer: 59 | Admitting: Emergency Medicine

## 2017-11-26 ENCOUNTER — Encounter (HOSPITAL_COMMUNITY): Payer: Self-pay | Admitting: *Deleted

## 2017-11-26 ENCOUNTER — Inpatient Hospital Stay (HOSPITAL_COMMUNITY): Payer: 59

## 2017-11-26 ENCOUNTER — Inpatient Hospital Stay (HOSPITAL_COMMUNITY)
Admission: RE | Admit: 2017-11-26 | Discharge: 2017-11-28 | DRG: 470 | Disposition: A | Discharge status: Home / Self Care | Payer: 59 | Source: Ambulatory Visit | Attending: Orthopedic Surgery | Admitting: Orthopedic Surgery

## 2017-11-26 ENCOUNTER — Inpatient Hospital Stay (HOSPITAL_COMMUNITY): Payer: 59 | Admitting: Anesthesiology

## 2017-11-26 ENCOUNTER — Encounter (HOSPITAL_COMMUNITY)
Admission: RE | Disposition: A | Discharge status: Home / Self Care | Payer: Self-pay | Source: Ambulatory Visit | Attending: Orthopedic Surgery

## 2017-11-26 DIAGNOSIS — Z96642 Presence of left artificial hip joint: Secondary | ICD-10-CM | POA: Diagnosis not present

## 2017-11-26 DIAGNOSIS — Z8701 Personal history of pneumonia (recurrent): Secondary | ICD-10-CM | POA: Diagnosis not present

## 2017-11-26 DIAGNOSIS — Z8249 Family history of ischemic heart disease and other diseases of the circulatory system: Secondary | ICD-10-CM

## 2017-11-26 DIAGNOSIS — F17210 Nicotine dependence, cigarettes, uncomplicated: Secondary | ICD-10-CM | POA: Diagnosis present

## 2017-11-26 DIAGNOSIS — Z96641 Presence of right artificial hip joint: Secondary | ICD-10-CM | POA: Diagnosis present

## 2017-11-26 DIAGNOSIS — M1610 Unilateral primary osteoarthritis, unspecified hip: Secondary | ICD-10-CM | POA: Diagnosis present

## 2017-11-26 DIAGNOSIS — Z79899 Other long term (current) drug therapy: Secondary | ICD-10-CM

## 2017-11-26 DIAGNOSIS — J441 Chronic obstructive pulmonary disease with (acute) exacerbation: Secondary | ICD-10-CM | POA: Diagnosis not present

## 2017-11-26 DIAGNOSIS — M069 Rheumatoid arthritis, unspecified: Secondary | ICD-10-CM | POA: Diagnosis present

## 2017-11-26 DIAGNOSIS — M1612 Unilateral primary osteoarthritis, left hip: Principal | ICD-10-CM | POA: Diagnosis present

## 2017-11-26 DIAGNOSIS — Z823 Family history of stroke: Secondary | ICD-10-CM | POA: Diagnosis not present

## 2017-11-26 DIAGNOSIS — J439 Emphysema, unspecified: Secondary | ICD-10-CM | POA: Diagnosis present

## 2017-11-26 DIAGNOSIS — Z8673 Personal history of transient ischemic attack (TIA), and cerebral infarction without residual deficits: Secondary | ICD-10-CM

## 2017-11-26 DIAGNOSIS — Z8261 Family history of arthritis: Secondary | ICD-10-CM | POA: Diagnosis not present

## 2017-11-26 DIAGNOSIS — J42 Unspecified chronic bronchitis: Secondary | ICD-10-CM | POA: Diagnosis present

## 2017-11-26 DIAGNOSIS — Z471 Aftercare following joint replacement surgery: Secondary | ICD-10-CM | POA: Diagnosis not present

## 2017-11-26 DIAGNOSIS — Z888 Allergy status to other drugs, medicaments and biological substances status: Secondary | ICD-10-CM

## 2017-11-26 DIAGNOSIS — Z419 Encounter for procedure for purposes other than remedying health state, unspecified: Secondary | ICD-10-CM

## 2017-11-26 DIAGNOSIS — I10 Essential (primary) hypertension: Secondary | ICD-10-CM | POA: Diagnosis not present

## 2017-11-26 DIAGNOSIS — M87052 Idiopathic aseptic necrosis of left femur: Secondary | ICD-10-CM

## 2017-11-26 DIAGNOSIS — K219 Gastro-esophageal reflux disease without esophagitis: Secondary | ICD-10-CM | POA: Diagnosis present

## 2017-11-26 DIAGNOSIS — J209 Acute bronchitis, unspecified: Secondary | ICD-10-CM | POA: Diagnosis not present

## 2017-11-26 DIAGNOSIS — M25552 Pain in left hip: Secondary | ICD-10-CM | POA: Diagnosis present

## 2017-11-26 HISTORY — PX: TOTAL HIP ARTHROPLASTY: SHX124

## 2017-11-26 SURGERY — ARTHROPLASTY, HIP, TOTAL, ANTERIOR APPROACH
Anesthesia: General | Site: Hip | Laterality: Left

## 2017-11-26 MED ORDER — MORPHINE SULFATE (PF) 4 MG/ML IV SOLN
INTRAVENOUS | Status: AC
  Filled 2017-11-26: qty 2

## 2017-11-26 MED ORDER — DOCUSATE SODIUM 100 MG PO CAPS
100.0000 mg | ORAL_CAPSULE | Freq: Two times a day (BID) | ORAL | Status: DC
  Administered 2017-11-26 – 2017-11-28 (×4): 100 mg via ORAL
  Filled 2017-11-26 (×4): qty 1

## 2017-11-26 MED ORDER — MENTHOL 3 MG MT LOZG: 1.0000 | LOZENGE | OROMUCOSAL | Status: DC | PRN

## 2017-11-26 MED ORDER — METOCLOPRAMIDE HCL 5 MG/ML IJ SOLN: 5.0000 mg | Freq: Three times a day (TID) | INTRAMUSCULAR | Status: DC | PRN

## 2017-11-26 MED ORDER — SODIUM CHLORIDE 0.9 % IR SOLN
Status: DC | PRN
  Administered 2017-11-26: 3000 mL

## 2017-11-26 MED ORDER — ROCURONIUM BROMIDE 10 MG/ML (PF) SYRINGE
PREFILLED_SYRINGE | INTRAVENOUS | Status: AC
  Filled 2017-11-26: qty 5

## 2017-11-26 MED ORDER — METOCLOPRAMIDE HCL 5 MG PO TABS: 5.0000 mg | ORAL_TABLET | Freq: Three times a day (TID) | ORAL | Status: DC | PRN

## 2017-11-26 MED ORDER — FAMOTIDINE 20 MG PO TABS
20.0000 mg | ORAL_TABLET | Freq: Every day | ORAL | Status: DC
  Administered 2017-11-26 – 2017-11-27 (×2): 20 mg via ORAL
  Filled 2017-11-26 (×2): qty 1

## 2017-11-26 MED ORDER — LACTATED RINGERS IV SOLN
INTRAVENOUS | Status: DC
  Administered 2017-11-26 (×3): via INTRAVENOUS

## 2017-11-26 MED ORDER — MORPHINE SULFATE (PF) 4 MG/ML IV SOLN
INTRAVENOUS | Status: DC | PRN
  Administered 2017-11-26: 8 mg

## 2017-11-26 MED ORDER — CLONIDINE HCL (ANALGESIA) 100 MCG/ML EP SOLN
EPIDURAL | Status: DC | PRN
  Administered 2017-11-26: 1 mL

## 2017-11-26 MED ORDER — ONDANSETRON HCL 4 MG/2ML IJ SOLN: 4.0000 mg | Freq: Four times a day (QID) | INTRAMUSCULAR | Status: DC | PRN

## 2017-11-26 MED ORDER — HYDROMORPHONE HCL 1 MG/ML IJ SOLN: 0.5000 mg | INTRAMUSCULAR | Status: DC | PRN

## 2017-11-26 MED ORDER — FLUTICASONE FUROATE-VILANTEROL 200-25 MCG/INH IN AEPB
1.0000 | INHALATION_SPRAY | Freq: Every day | RESPIRATORY_TRACT | Status: DC
  Filled 2017-11-26: qty 28

## 2017-11-26 MED ORDER — TRANEXAMIC ACID 1000 MG/10ML IV SOLN
2000.0000 mg | INTRAVENOUS | Status: AC
  Administered 2017-11-26: 2000 mg via TOPICAL
  Filled 2017-11-26: qty 20

## 2017-11-26 MED ORDER — ALBUTEROL SULFATE (2.5 MG/3ML) 0.083% IN NEBU: 2.5000 mg | INHALATION_SOLUTION | Freq: Four times a day (QID) | RESPIRATORY_TRACT | Status: DC | PRN

## 2017-11-26 MED ORDER — METHOCARBAMOL 1000 MG/10ML IJ SOLN
500.0000 mg | Freq: Four times a day (QID) | INTRAVENOUS | Status: DC | PRN
  Filled 2017-11-26: qty 5

## 2017-11-26 MED ORDER — FENTANYL CITRATE (PF) 250 MCG/5ML IJ SOLN
INTRAMUSCULAR | Status: AC
  Filled 2017-11-26: qty 5

## 2017-11-26 MED ORDER — AMLODIPINE BESYLATE 10 MG PO TABS
10.0000 mg | ORAL_TABLET | Freq: Every day | ORAL | Status: DC
  Administered 2017-11-26 – 2017-11-27 (×2): 10 mg via ORAL
  Filled 2017-11-26 (×3): qty 1

## 2017-11-26 MED ORDER — PHENYLEPHRINE 40 MCG/ML (10ML) SYRINGE FOR IV PUSH (FOR BLOOD PRESSURE SUPPORT)
PREFILLED_SYRINGE | INTRAVENOUS | Status: DC | PRN
  Administered 2017-11-26: 40 ug via INTRAVENOUS
  Administered 2017-11-26 (×2): 80 ug via INTRAVENOUS
  Administered 2017-11-26 (×2): 40 ug via INTRAVENOUS
  Administered 2017-11-26: 80 ug via INTRAVENOUS
  Administered 2017-11-26: 40 ug via INTRAVENOUS

## 2017-11-26 MED ORDER — OXYCODONE HCL 5 MG PO TABS
ORAL_TABLET | ORAL | Status: AC
  Filled 2017-11-26: qty 1

## 2017-11-26 MED ORDER — TIOTROPIUM BROMIDE MONOHYDRATE 18 MCG IN CAPS
18.0000 ug | ORAL_CAPSULE | Freq: Every day | RESPIRATORY_TRACT | Status: DC
  Administered 2017-11-27: 18 ug via RESPIRATORY_TRACT
  Filled 2017-11-26: qty 5

## 2017-11-26 MED ORDER — LIDOCAINE 2% (20 MG/ML) 5 ML SYRINGE
INTRAMUSCULAR | Status: AC
  Filled 2017-11-26: qty 5

## 2017-11-26 MED ORDER — CLONIDINE HCL 0.2 MG PO TABS
0.2000 mg | ORAL_TABLET | Freq: Two times a day (BID) | ORAL | Status: DC
  Administered 2017-11-26 – 2017-11-28 (×4): 0.2 mg via ORAL
  Filled 2017-11-26 (×4): qty 1

## 2017-11-26 MED ORDER — PHENOL 1.4 % MT LIQD: 1.0000 | OROMUCOSAL | Status: DC | PRN

## 2017-11-26 MED ORDER — CEFAZOLIN SODIUM-DEXTROSE 2-4 GM/100ML-% IV SOLN
2.0000 g | Freq: Four times a day (QID) | INTRAVENOUS | Status: AC
  Administered 2017-11-26 (×2): 2 g via INTRAVENOUS
  Filled 2017-11-26 (×2): qty 100

## 2017-11-26 MED ORDER — CEFAZOLIN SODIUM-DEXTROSE 2-4 GM/100ML-% IV SOLN
INTRAVENOUS | Status: AC
  Filled 2017-11-26: qty 100

## 2017-11-26 MED ORDER — GABAPENTIN 300 MG PO CAPS
300.0000 mg | ORAL_CAPSULE | Freq: Three times a day (TID) | ORAL | Status: DC
  Administered 2017-11-26 – 2017-11-28 (×6): 300 mg via ORAL
  Filled 2017-11-26 (×6): qty 1

## 2017-11-26 MED ORDER — HYDROMORPHONE HCL 2 MG/ML IJ SOLN
0.5000 mg | INTRAMUSCULAR | Status: DC | PRN
  Administered 2017-11-26 – 2017-11-27 (×3): 1 mg via INTRAVENOUS
  Filled 2017-11-26 (×3): qty 1

## 2017-11-26 MED ORDER — MIDAZOLAM HCL 2 MG/2ML IJ SOLN
INTRAMUSCULAR | Status: AC
  Filled 2017-11-26: qty 2

## 2017-11-26 MED ORDER — CEFAZOLIN SODIUM-DEXTROSE 2-4 GM/100ML-% IV SOLN
2.0000 g | INTRAVENOUS | Status: AC
  Administered 2017-11-26: 2 g via INTRAVENOUS

## 2017-11-26 MED ORDER — BUPIVACAINE HCL (PF) 0.25 % IJ SOLN
INTRAMUSCULAR | Status: AC
  Filled 2017-11-26: qty 30

## 2017-11-26 MED ORDER — RIVAROXABAN 10 MG PO TABS
10.0000 mg | ORAL_TABLET | Freq: Every day | ORAL | Status: DC
  Administered 2017-11-27 – 2017-11-28 (×2): 10 mg via ORAL
  Filled 2017-11-26 (×2): qty 1

## 2017-11-26 MED ORDER — BUPIVACAINE HCL (PF) 0.25 % IJ SOLN
INTRAMUSCULAR | Status: DC | PRN
  Administered 2017-11-26: 30 mL

## 2017-11-26 MED ORDER — 0.9 % SODIUM CHLORIDE (POUR BTL) OPTIME
TOPICAL | Status: DC | PRN
  Administered 2017-11-26 (×3): 1000 mL

## 2017-11-26 MED ORDER — FENOFIBRATE 54 MG PO TABS
54.0000 mg | ORAL_TABLET | Freq: Every day | ORAL | Status: DC
  Administered 2017-11-27 – 2017-11-28 (×2): 54 mg via ORAL
  Filled 2017-11-26 (×3): qty 1

## 2017-11-26 MED ORDER — METHOCARBAMOL 500 MG PO TABS
500.0000 mg | ORAL_TABLET | Freq: Four times a day (QID) | ORAL | Status: DC | PRN
  Administered 2017-11-26 – 2017-11-28 (×4): 500 mg via ORAL
  Filled 2017-11-26 (×3): qty 1

## 2017-11-26 MED ORDER — FENTANYL CITRATE (PF) 250 MCG/5ML IJ SOLN
INTRAMUSCULAR | Status: DC | PRN
  Administered 2017-11-26: 50 ug via INTRAVENOUS
  Administered 2017-11-26: 100 ug via INTRAVENOUS
  Administered 2017-11-26 (×2): 50 ug via INTRAVENOUS

## 2017-11-26 MED ORDER — ONDANSETRON HCL 4 MG/2ML IJ SOLN: 4.0000 mg | Freq: Once | INTRAMUSCULAR | Status: DC | PRN

## 2017-11-26 MED ORDER — ACETAMINOPHEN 325 MG PO TABS
325.0000 mg | ORAL_TABLET | Freq: Four times a day (QID) | ORAL | Status: DC | PRN
  Administered 2017-11-28: 650 mg via ORAL
  Filled 2017-11-26: qty 2

## 2017-11-26 MED ORDER — FENTANYL CITRATE (PF) 100 MCG/2ML IJ SOLN
INTRAMUSCULAR | Status: AC
  Administered 2017-11-26: 50 ug via INTRAVENOUS
  Filled 2017-11-26: qty 2

## 2017-11-26 MED ORDER — FENTANYL CITRATE (PF) 100 MCG/2ML IJ SOLN
25.0000 ug | INTRAMUSCULAR | Status: DC | PRN
  Administered 2017-11-26 (×2): 50 ug via INTRAVENOUS

## 2017-11-26 MED ORDER — OXYCODONE HCL 5 MG/5ML PO SOLN: 5.0000 mg | Freq: Once | ORAL | Status: AC | PRN

## 2017-11-26 MED ORDER — ONDANSETRON HCL 4 MG/2ML IJ SOLN
INTRAMUSCULAR | Status: DC | PRN
  Administered 2017-11-26: 4 mg via INTRAVENOUS

## 2017-11-26 MED ORDER — SUGAMMADEX SODIUM 200 MG/2ML IV SOLN
INTRAVENOUS | Status: DC | PRN
  Administered 2017-11-26: 200 mg via INTRAVENOUS

## 2017-11-26 MED ORDER — DEXAMETHASONE SODIUM PHOSPHATE 10 MG/ML IJ SOLN
INTRAMUSCULAR | Status: AC
  Filled 2017-11-26: qty 1

## 2017-11-26 MED ORDER — DEXAMETHASONE SODIUM PHOSPHATE 10 MG/ML IJ SOLN
INTRAMUSCULAR | Status: DC | PRN
  Administered 2017-11-26: 5 mg via INTRAVENOUS

## 2017-11-26 MED ORDER — B COMPLEX-C PO TABS
1.0000 | ORAL_TABLET | Freq: Every day | ORAL | Status: DC
  Administered 2017-11-27 – 2017-11-28 (×2): 1 via ORAL
  Filled 2017-11-26 (×2): qty 1

## 2017-11-26 MED ORDER — PROPOFOL 10 MG/ML IV BOLUS
INTRAVENOUS | Status: DC | PRN
  Administered 2017-11-26: 200 mg via INTRAVENOUS

## 2017-11-26 MED ORDER — CHLORHEXIDINE GLUCONATE 4 % EX LIQD: 60.0000 mL | Freq: Once | CUTANEOUS | Status: DC

## 2017-11-26 MED ORDER — ROCURONIUM BROMIDE 10 MG/ML (PF) SYRINGE
PREFILLED_SYRINGE | INTRAVENOUS | Status: DC | PRN
  Administered 2017-11-26: 20 mg via INTRAVENOUS
  Administered 2017-11-26: 30 mg via INTRAVENOUS
  Administered 2017-11-26: 20 mg via INTRAVENOUS
  Administered 2017-11-26: 30 mg via INTRAVENOUS
  Administered 2017-11-26: 70 mg via INTRAVENOUS

## 2017-11-26 MED ORDER — ALBUTEROL SULFATE (2.5 MG/3ML) 0.083% IN NEBU: 3.0000 mL | INHALATION_SOLUTION | RESPIRATORY_TRACT | Status: DC | PRN

## 2017-11-26 MED ORDER — OXYCODONE HCL 5 MG PO TABS
5.0000 mg | ORAL_TABLET | Freq: Once | ORAL | Status: AC | PRN
  Administered 2017-11-26: 5 mg via ORAL

## 2017-11-26 MED ORDER — LIDOCAINE 2% (20 MG/ML) 5 ML SYRINGE
INTRAMUSCULAR | Status: DC | PRN
  Administered 2017-11-26: 60 mg via INTRAVENOUS

## 2017-11-26 MED ORDER — PANTOPRAZOLE SODIUM 40 MG PO TBEC
40.0000 mg | DELAYED_RELEASE_TABLET | Freq: Every day | ORAL | Status: DC
  Administered 2017-11-26 – 2017-11-28 (×3): 40 mg via ORAL
  Filled 2017-11-26 (×3): qty 1

## 2017-11-26 MED ORDER — LACTATED RINGERS IV SOLN
INTRAVENOUS | Status: AC
  Administered 2017-11-26: 18:00:00 via INTRAVENOUS

## 2017-11-26 MED ORDER — METHOCARBAMOL 500 MG PO TABS
ORAL_TABLET | ORAL | Status: AC
  Filled 2017-11-26: qty 1

## 2017-11-26 MED ORDER — ROFLUMILAST 500 MCG PO TABS
500.0000 ug | ORAL_TABLET | Freq: Every day | ORAL | Status: DC
  Administered 2017-11-27 – 2017-11-28 (×2): 500 ug via ORAL
  Filled 2017-11-26 (×2): qty 1

## 2017-11-26 MED ORDER — ONDANSETRON HCL 4 MG PO TABS: 4.0000 mg | ORAL_TABLET | Freq: Four times a day (QID) | ORAL | Status: DC | PRN

## 2017-11-26 MED ORDER — ONDANSETRON HCL 4 MG/2ML IJ SOLN
INTRAMUSCULAR | Status: AC
  Filled 2017-11-26: qty 2

## 2017-11-26 MED ORDER — MIDAZOLAM HCL 5 MG/5ML IJ SOLN
INTRAMUSCULAR | Status: DC | PRN
  Administered 2017-11-26: 2 mg via INTRAVENOUS

## 2017-11-26 MED ORDER — OXYCODONE HCL 5 MG PO TABS
5.0000 mg | ORAL_TABLET | ORAL | Status: DC | PRN
  Administered 2017-11-26 – 2017-11-28 (×7): 10 mg via ORAL
  Filled 2017-11-26 (×7): qty 2

## 2017-11-26 SURGICAL SUPPLY — 50 items
BAG BILE T-TUBES STRL (MISCELLANEOUS) IMPLANT
BAG DECANTER FOR FLEXI CONT (MISCELLANEOUS) ×3 IMPLANT
BLADE CLIPPER SURG (BLADE) ×3 IMPLANT
BLADE SAW SGTL 18X1.27X75 (BLADE) ×2 IMPLANT
BLADE SAW SGTL 18X1.27X75MM (BLADE) ×1
CAPT HIP TOTAL 2 ×3 IMPLANT
CELLS DAT CNTRL 66122 CELL SVR (MISCELLANEOUS) IMPLANT
CLOSURE WOUND 1/2 X4 (GAUZE/BANDAGES/DRESSINGS) ×1
COVER PERINEAL POST (MISCELLANEOUS) ×3 IMPLANT
COVER SURGICAL LIGHT HANDLE (MISCELLANEOUS) ×3 IMPLANT
DRAPE C-ARM 42X72 X-RAY (DRAPES) ×3 IMPLANT
DRAPE STERI IOBAN 125X83 (DRAPES) ×3 IMPLANT
DRAPE U-SHAPE 47X51 STRL (DRAPES) ×9 IMPLANT
DRSG AQUACEL AG ADV 3.5X10 (GAUZE/BANDAGES/DRESSINGS) ×3 IMPLANT
DURAPREP 26ML APPLICATOR (WOUND CARE) ×3 IMPLANT
ELECT BLADE 4.0 EZ CLEAN MEGAD (MISCELLANEOUS) ×3
ELECT REM PT RETURN 9FT ADLT (ELECTROSURGICAL) ×3
ELECTRODE BLDE 4.0 EZ CLN MEGD (MISCELLANEOUS) ×1 IMPLANT
ELECTRODE REM PT RTRN 9FT ADLT (ELECTROSURGICAL) ×1 IMPLANT
GLOVE BIOGEL PI IND STRL 8 (GLOVE) ×1 IMPLANT
GLOVE BIOGEL PI INDICATOR 8 (GLOVE) ×2
GLOVE BIOGEL PI ORTHO PRO SZ8 (GLOVE) ×4
GLOVE PI ORTHO PRO STRL SZ8 (GLOVE) ×2 IMPLANT
GOWN STRL REUS W/ TWL LRG LVL3 (GOWN DISPOSABLE) ×3 IMPLANT
GOWN STRL REUS W/TWL LRG LVL3 (GOWN DISPOSABLE) ×6
HANDPIECE INTERPULSE COAX TIP (DISPOSABLE) ×2
HEAD CERAMIC DELTA 36 PLUS 1.5 (Hips) ×3 IMPLANT
HOOD PEEL AWAY FLYTE STAYCOOL (MISCELLANEOUS) ×6 IMPLANT
KIT BASIN OR (CUSTOM PROCEDURE TRAY) ×3 IMPLANT
KIT TURNOVER KIT B (KITS) ×3 IMPLANT
NEEDLE HYPO 22GX1.5 SAFETY (NEEDLE) ×3 IMPLANT
NS IRRIG 1000ML POUR BTL (IV SOLUTION) ×3 IMPLANT
PACK TOTAL JOINT (CUSTOM PROCEDURE TRAY) ×3 IMPLANT
PAD ARMBOARD 7.5X6 YLW CONV (MISCELLANEOUS) ×6 IMPLANT
RTRCTR WOUND ALEXIS 18CM MED (MISCELLANEOUS)
SET HNDPC FAN SPRY TIP SCT (DISPOSABLE) ×1 IMPLANT
STRIP CLOSURE SKIN 1/2X4 (GAUZE/BANDAGES/DRESSINGS) ×2 IMPLANT
SUT ETHIBOND NAB CT1 #1 30IN (SUTURE) ×6 IMPLANT
SUT MNCRL AB 3-0 PS2 18 (SUTURE) ×3 IMPLANT
SUT VIC AB 0 CT1 27 (SUTURE) ×8
SUT VIC AB 0 CT1 27XBRD ANBCTR (SUTURE) ×4 IMPLANT
SUT VIC AB 1 CT1 27 (SUTURE) ×12
SUT VIC AB 1 CT1 27XBRD ANBCTR (SUTURE) ×6 IMPLANT
SUT VIC AB 2-0 CT1 27 (SUTURE) ×4
SUT VIC AB 2-0 CT1 TAPERPNT 27 (SUTURE) ×2 IMPLANT
SYR 30ML LL (SYRINGE) ×3 IMPLANT
TOWEL OR 17X24 6PK STRL BLUE (TOWEL DISPOSABLE) IMPLANT
TOWEL OR 17X26 10 PK STRL BLUE (TOWEL DISPOSABLE) ×3 IMPLANT
TRAY FOLEY W/METER SILVER 16FR (SET/KITS/TRAYS/PACK) IMPLANT
WATER STERILE IRR 1000ML POUR (IV SOLUTION) IMPLANT

## 2017-11-26 NOTE — Plan of Care (Signed)
  Problem: Education: Goal: Knowledge of General Education information will improve Outcome: Progressing   Problem: Coping: Goal: Level of anxiety will decrease Outcome: Progressing   Problem: Pain Managment: Goal: General experience of comfort will improve Outcome: Progressing   Problem: Safety: Goal: Ability to remain free from injury will improve Outcome: Progressing   Problem: Skin Integrity: Goal: Risk for impaired skin integrity will decrease Outcome: Progressing

## 2017-11-26 NOTE — Transfer of Care (Signed)
Immediate Anesthesia Transfer of Care Note  Patient: Derek Conner  Procedure(s) Performed: LEFT TOTAL HIP ARTHROPLASTY ANTERIOR APPROACH (Left Hip)  Patient Location: PACU  Anesthesia Type:General  Level of Consciousness: awake, alert , oriented and sedated  Airway & Oxygen Therapy: Patient Spontanous Breathing  Post-op Assessment: Report given to RN, Post -op Vital signs reviewed and stable and Patient moving all extremities  Post vital signs: Reviewed and stable  Last Vitals:  Vitals Value Taken Time  BP 150/92 11/26/2017  4:19 PM  Temp    Pulse 81 11/26/2017  4:26 PM  Resp 11 11/26/2017  4:26 PM  SpO2 100 % 11/26/2017  4:26 PM  Vitals shown include unvalidated device data.  Last Pain:  Vitals:   11/26/17 1114  TempSrc:   PainSc: 3          Complications: No apparent anesthesia complications

## 2017-11-26 NOTE — Brief Op Note (Signed)
11/26/2017  4:18 PM  PATIENT:  Derek Conner  57 y.o. male  PRE-OPERATIVE DIAGNOSIS:  Avascular Necrosis Left Hip  POST-OPERATIVE DIAGNOSIS:  Avascular Necrosis Left Hip  PROCEDURE:  Procedure(s): LEFT TOTAL HIP ARTHROPLASTY ANTERIOR APPROACH  SURGEON:  Surgeon(s): Cammy Copa, MD  ASSISTANT: April Green RNFA  ANESTHESIA:   general  EBL: 250 ml    Total I/O In: 2000 [I.V.:2000] Out: 250 [Blood:250]  BLOOD ADMINISTERED: none  DRAINS: none   LOCAL MEDICATIONS USED:  marcaine mso4 clonidine in skin SPECIMEN:  No Specimen  COUNTS:  YES  TOURNIQUET:  * No tourniquets in log *  DICTATION: .Other Dictation: Dictation Number [id number]  PLAN OF CARE: Admit to inpatient   PATIENT DISPOSITION:  PACU - hemodynamically stable

## 2017-11-26 NOTE — H&P (Signed)
TOTAL HIP ADMISSION H&P  Patient is admitted for left total hip arthroplasty.  Subjective:  Chief Complaint: left hip pain  HPI: Derek Conner, 57 y.o. male, has a history of pain and functional disability in the left hip(s) due to arthritis and patient has failed non-surgical conservative treatments for greater than 12 weeks to include use of assistive devices and activity modification.  Onset of symptoms was abrupt starting 1 years ago with rapidlly worsening course since that time.The patient noted no past surgery on the left hip(s).  Patient currently rates pain in the left hip at 8 out of 10 with activity. Patient has night pain, worsening of pain with activity and weight bearing, trendelenberg gait, pain that interfers with activities of daily living and pain with passive range of motion. Patient has evidence of joint space narrowing and Avascular necrosis with collapse by imaging studies. This condition presents safety issues increasing the risk of falls. This patient has had Good result with right total hip replacement.  There is no current active infection.  Patient Active Problem List   Diagnosis Date Noted  . 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 Nocturnal hypoxemia 04/03/2017  . Fatigue 01/09/2017  . Thrush 12/12/2016  . Brain aneurysm 03/27/2016  . Preop respiratory exam 03/26/2016  . Aneurysm (HCC)   . Chronic obstructive pulmonary disease with acute exacerbation (HCC) 01/19/2016  . Community acquired pneumonia 01/19/2016  . Cough syncope 01/19/2016  . Wellness examination 08/29/2015  . Hyperglycemia 01/06/2015  . HTN (hypertension) 12/09/2014  . COPD exacerbation (HCC) 07/08/2014  . Atypical chest pain 07/08/2014  . 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 Dry mouth 12/26/2013  . Cigarette smoker 12/26/2013  . Nodule of left lung 12/26/2013  . COPD, severity to be determined (HCC) 12/26/2013  . Pulmonary nodule, right 12/06/2013  . Hemoptysis 12/03/2013  . COPD (chronic  obstructive pulmonary disease) (HCC) 12/03/2013  . Acute bronchitis 12/03/2013  . Tobacco use disorder 12/03/2013   Past Medical History:  Diagnosis Date  . Asthma   . CAP (community acquired pneumonia) 01/19/2016  . Cerebral aneurysm   . Chronic bronchitis (HCC)   . COPD (chronic obstructive pulmonary disease) (HCC)   . Emphysema lung (HCC)   . Emphysema of lung (HCC)   . GERD (gastroesophageal reflux disease)   . Hepatitis A infection ~ 1980  . High cholesterol   . Hypertension   . Pneumonia 1960s  . Rheumatoid arthritis (HCC)    "hands mostly"  ( 01/19/2016)  . Situational depression 2009/12/16   "only when my mother died"  . Stroke [facility])    mini stroke when doing embolization, 17-Dec-2015, no deficits    Past Surgical History:  Procedure Laterality Date  . APPENDECTOMY  1980's  . CHEST TUBE INSERTION Left 1990's   "lung collapsed"  . CYSTECTOMY Left 1960's   "wrist"  . FOOT FRACTURE SURGERY Left ~ 12-17-03   "it was crushed"  . IR GENERIC HISTORICAL  03/27/2016   IR ANGIOGRAM FOLLOW UP STUDY 03/27/2016 Julieanne Cotton, MD [facility]  . IR GENERIC HISTORICAL  03/27/2016   IR ANGIOGRAM FOLLOW UP STUDY 03/27/2016 Julieanne Cotton, MD [facility]  . IR GENERIC HISTORICAL  03/27/2016   IR ANGIO INTRA EXTRACRAN SEL COM CAROTID INNOMINATE UNI L MOD SED 03/27/2016 Julieanne Cotton, MD [facility]  . IR GENERIC HISTORICAL  03/27/2016   IR ANGIOGRAM FOLLOW UP STUDY 03/27/2016 Julieanne Cotton, MD [facility]  . IR GENERIC HISTORICAL  03/27/2016   IR ANGIO VERTEBRAL SEL SUBCLAVIAN  INNOMINATE UNI R MOD SED 03/27/2016 Julieanne Cotton, MD [facility]  . IR GENERIC HISTORICAL  03/27/2016   IR 3D INDEPENDENT WKST 03/27/2016 Julieanne Cotton, MD [facility]  . IR GENERIC HISTORICAL  03/27/2016   IR ANGIOGRAM FOLLOW UP STUDY 03/27/2016 Julieanne Cotton, MD [facility]  . IR GENERIC HISTORICAL  03/27/2016   IR NEURO EACH ADD'L AFTER BASIC UNI RIGHT (MS) 03/27/2016 Julieanne Cotton, MD  [facility]  . IR GENERIC HISTORICAL  03/27/2016   IR ANGIOGRAM FOLLOW UP STUDY 03/27/2016 Julieanne Cotton, MD [facility]  . IR GENERIC HISTORICAL  03/27/2016   IR TRANSCATH/EMBOLIZ 03/27/2016 Julieanne Cotton, MD [facility]  . IR GENERIC HISTORICAL  03/27/2016   IR ANGIO INTRA EXTRACRAN SEL INTERNAL CAROTID UNI R MOD SED 03/27/2016 Julieanne Cotton, MD [facility]  . IR GENERIC HISTORICAL  03/27/2016   IR ANGIOGRAM FOLLOW UP STUDY 03/27/2016 Julieanne Cotton, MD [facility]  . IR GENERIC HISTORICAL  04/12/2016   IR RADIOLOGIST EVAL & MGMT 04/12/2016 [facility]  . RADIOLOGY WITH ANESTHESIA N/A 02/29/2016   Procedure: EMBOLIZATION     (RADIOLOGY WITH ANESTHESIA);  Surgeon: Julieanne Cotton, MD;  Location: [facility] OR;  Service: Radiology;  Laterality: N/A;  . RADIOLOGY WITH ANESTHESIA N/A 03/27/2016   Procedure: Embolization;  Surgeon: Julieanne Cotton, MD;  Location: [facility] OR;  Service: Radiology;  Laterality: N/A;  . TOTAL HIP ARTHROPLASTY Right 09/23/2017  . TOTAL HIP ARTHROPLASTY Right 09/23/2017   Procedure: RIGHT TOTAL HIP ARTHROPLASTY ANTERIOR APPROACH;  Surgeon: Cammy Copa, MD;  Location[facility] OR;  Service: Orthopedics;  Laterality: Right;  Marland Kitchen VIDEO BRONCHOSCOPY Bilateral 12/06/2013   Procedure: VIDEO BRONCHOSCOPY WITHOUT FLUORO;  Surgeon: Alyson Reedy, MD;  Location: [facility] ENDOSCOPY;  Service: Cardiopulmonary;  Laterality: Bilateral;    Current Facility-Administered Medications  Medication Dose Route Frequency Provider Last Rate Last Dose  . ceFAZolin (ANCEF) 2-4 GM/100ML-% IVPB           . ceFAZolin (ANCEF) IVPB 2g/100 mL premix  2 g Intravenous On Call to OR Cammy Copa, MD      . chlorhexidine (HIBICLENS) 4 % liquid 4 application  60 mL Topical Once Cammy Copa, MD      . chlorhexidine (HIBICLENS) 4 % liquid 4 application  60 mL Topical Once Cammy Copa, MD      . lactated ringers infusion   Intravenous Continuous Beryle Lathe, MD      . tranexamic  acid (CYKLOKAPRON) 1,000 mg in sodium chloride 0.9 % 100 mL IVPB  1,000 mg Intravenous To OR August Saucer Corrie Mckusick, MD       Facility-Administered Medications Ordered in Other Encounters  Medication Dose Route Frequency Provider Last Rate Last Dose  . 0.9 %  sodium chloride infusion   Intravenous Continuous Ralene Muskrat, PA-C      . aspirin EC tablet 325 mg  325 mg Oral 60 min Pre-Op Ralene Muskrat, PA-C      . niMODipine (NIMOTOP) capsule 0-60 mg  0-60 mg Oral 60 min Pre-Op Ralene Muskrat, PA-C       Allergies  Allergen Reactions  . Symbicort [Budesonide-Formoterol Fumarate] Shortness Of Breath, Nausea And Vomiting and Other (See Comments)    heachache  . Dulera [Mometasone Furo-Formoterol Fum] Other (See Comments)    Headache   . Humira [Adalimumab] Nausea And Vomiting    Headaches     Social History   Tobacco Use  . Smoking status: Light Tobacco Smoker    Packs/day: 0.50  Years: 40.00    Pack years: 20.00    Types: Cigarettes  . Smokeless tobacco: Never Used  . Tobacco comment: 11/13/2017: 4 cigarettes a day  Substance Use Topics  . Alcohol use: Yes    Alcohol/week: 1.2 - 1.8 oz    Types: 2 - 3 Shots of liquor per week    Comment: 3 drinks liquor per day    Family History  Problem Relation Age of Onset  . Cancer Mother   . Arthritis Mother   . Stroke Father   . Hypertension Father   . Healthy Daughter   . Healthy Son   . Colon cancer Neg Hx      ROS  Objective:  Physical Exam  Vital signs in last 24 hours: Temp:  [98.3 F (36.8 C)] 98.3 F (36.8 C) (04/24 1027) Pulse Rate:  [87] 87 (04/24 1027) Resp:  [18] 18 (04/24 1027) BP: (132)/(81) 132/81 (04/24 1027) SpO2:  [99 %] 99 % (04/24 1027) Weight:  [186 lb 14.4 oz (84.8 kg)] 186 lb 14.4 oz (84.8 kg) (04/24 1027)  Labs:   Estimated body mass index is 25.35 kg/m as calculated from the following:   Height as of this encounter: 6' (1.829 m).   Weight as of this encounter: 186 lb 14.4 oz (84.8  kg).   Imaging Review Plain radiographs demonstrate moderate degenerative joint disease of the left hip(s). The bone quality appears to be good for age and reported activity level.    Preoperative templating of the joint replacement has been completed, documented, and submitted to the Operating Room personnel in order to optimize intra-operative equipment management.   Anticipated LOS equal to or greater than 2 midnights due to - Age 72 and older with one or more of the following:  - Obesity  - Expected need for hospital services (PT, OT, Nursing) required for safe  discharge  - Anticipated need for postoperative skilled nursing care or inpatient rehab  - Active co-morbidities: History of right total hip replacement.  He may be able to be discharged after 1-2 nights. OR   - Unanticipated findings during/Post Surgery: None  - Patient is a high risk of re-admission due to: Possibly prior surgery within 6 months     Assessment/Plan:  End stage arthritis, left hip(s)  The patient history, physical examination, clinical judgement of the provider and imaging studies are consistent with end stage degenerative joint disease of the left hip(s) and total hip arthroplasty is deemed medically necessary. The treatment options including medical management, injection therapy, arthroscopy and arthroplasty were discussed at length. The risks and benefits of total hip arthroplasty were presented and reviewed. The risks due to aseptic loosening, infection, stiffness, dislocation/subluxation,  thromboembolic complications and other imponderables were discussed.  The patient acknowledged the explanation, agreed to proceed with the plan and consent was signed. Patient is being admitted for inpatient treatment for surgery, pain control, PT, OT, prophylactic antibiotics, VTE prophylaxis, progressive ambulation and ADL's and discharge planning.The patient is planning to be discharged home with home health  services patient currently is having no fevers or chills.  He does report debilitating left hip pain similar to the right hip prior to right hip replacement.  MRI scan is consistent with avascular necrosis and he does have some early collapse on plain radiographs.  He is having mechanical symptoms and pain which is preventing him from doing his activities of daily living as well as working.

## 2017-11-26 NOTE — Anesthesia Procedure Notes (Signed)
Procedure Name: Intubation Date/Time: 11/26/2017 12:45 PM Performed by: Freddie Breech, CRNA Pre-anesthesia Checklist: Patient identified, Emergency Drugs available, Suction available and Patient being monitored Patient Re-evaluated:Patient Re-evaluated prior to induction Oxygen Delivery Method: Circle System Utilized Preoxygenation: Pre-oxygenation with 100% oxygen Induction Type: IV induction Ventilation: Mask ventilation without difficulty Laryngoscope Size: Mac and 4 Grade View: Grade I Tube type: Oral Tube size: 7.5 mm Number of attempts: 1 Airway Equipment and Method: Stylet and Oral airway Placement Confirmation: ETT inserted through vocal cords under direct vision,  positive ETCO2 and breath sounds checked- equal and bilateral Secured at: 23 cm Tube secured with: Tape Dental Injury: Teeth and Oropharynx as per pre-operative assessment

## 2017-11-26 NOTE — Anesthesia Preprocedure Evaluation (Addendum)
Anesthesia Evaluation  Patient identified by MRN, date of birth, ID band Patient awake    Reviewed: Allergy & Precautions, H&P , NPO status , Patient's Chart, lab work & pertinent test results  Airway Mallampati: II  TM Distance: >3 FB Neck ROM: Full    Dental  (+) Dental Advisory Given, Teeth Intact   Pulmonary asthma , COPD,  COPD inhaler, Current Smoker,    breath sounds clear to auscultation       Cardiovascular hypertension, Pt. on medications + Peripheral Vascular Disease   Rhythm:Regular Rate:Normal     Neuro/Psych PSYCHIATRIC DISORDERS Depression CVA, No Residual Symptoms    GI/Hepatic GERD  Medicated and Controlled,(+) Hepatitis -, A  Endo/Other  negative endocrine ROS  Renal/GU negative Renal ROS  negative genitourinary   Musculoskeletal  (+) Arthritis , Rheumatoid disorders,    Abdominal   Peds  Hematology negative hematology ROS (+)   Anesthesia Other Findings   Reproductive/Obstetrics                            Anesthesia Physical  Anesthesia Plan  ASA: III  Anesthesia Plan: General   Post-op Pain Management:    Induction: Intravenous  PONV Risk Score and Plan: 3 and Treatment may vary due to age or medical condition, Dexamethasone, Ondansetron and Midazolam  Airway Management Planned: Oral ETT  Additional Equipment: None  Intra-op Plan:   Post-operative Plan: Extubation in OR  Informed Consent: I have reviewed the patients History and Physical, chart, labs and discussed the procedure including the risks, benefits and alternatives for the proposed anesthesia with the patient or authorized representative who has indicated his/her understanding and acceptance.   Dental advisory given  Plan Discussed with: CRNA and Anesthesiologist  Anesthesia Plan Comments: (Last dose of ticagrelor was Friday. Will plan for GA.)      Anesthesia Quick Evaluation

## 2017-11-26 NOTE — Anesthesia Postprocedure Evaluation (Signed)
Anesthesia Post Note  Patient: Derek Conner  Procedure(s) Performed: LEFT TOTAL HIP ARTHROPLASTY ANTERIOR APPROACH (Left Hip)     Patient location during evaluation: PACU Anesthesia Type: General Level of consciousness: awake and alert Pain management: pain level controlled Vital Signs Assessment: post-procedure vital signs reviewed and stable Respiratory status: spontaneous breathing, nonlabored ventilation, respiratory function stable and patient connected to nasal cannula oxygen Cardiovascular status: blood pressure returned to baseline and stable Postop Assessment: no apparent nausea or vomiting Anesthetic complications: no    Last Vitals:  Vitals:   11/26/17 1725 11/26/17 1738  BP: (!) 135/96 137/83  Pulse: 75 74  Resp: 12 14  Temp: (!) 36.4 C 36.4 C  SpO2: 96% 98%    Last Pain:  Vitals:   11/26/17 1738  TempSrc: Oral  PainSc:                  Alaylah Heatherington COKER

## 2017-11-27 ENCOUNTER — Encounter (HOSPITAL_COMMUNITY): Payer: Self-pay | Admitting: Orthopedic Surgery

## 2017-11-27 MED ORDER — OXYCODONE HCL 5 MG PO TABS: 5.0000 mg | ORAL_TABLET | ORAL | 0 refills | Status: DC | PRN

## 2017-11-27 MED ORDER — FENOFIBRATE 54 MG PO TABS: 54.0000 mg | ORAL_TABLET | Freq: Every day | ORAL | 0 refills | Status: DC

## 2017-11-27 MED ORDER — DOCUSATE SODIUM 100 MG PO CAPS: 100.0000 mg | ORAL_CAPSULE | Freq: Two times a day (BID) | ORAL | 0 refills | Status: DC

## 2017-11-27 MED ORDER — GABAPENTIN 300 MG PO CAPS
300.0000 mg | ORAL_CAPSULE | Freq: Three times a day (TID) | ORAL | 0 refills | Status: DC
Start: 2017-11-27 — End: 2017-12-18

## 2017-11-27 MED ORDER — METHOCARBAMOL 500 MG PO TABS: 500.0000 mg | ORAL_TABLET | Freq: Four times a day (QID) | ORAL | 0 refills | Status: DC | PRN

## 2017-11-27 MED ORDER — HYDROMORPHONE HCL 2 MG/ML IJ SOLN
0.5000 mg | INTRAMUSCULAR | Status: DC | PRN
  Administered 2017-11-27 (×3): 1 mg via INTRAVENOUS
  Filled 2017-11-27 (×3): qty 1

## 2017-11-27 MED ORDER — RIVAROXABAN 10 MG PO TABS: 10.0000 mg | ORAL_TABLET | Freq: Every day | ORAL | 0 refills | Status: DC

## 2017-11-27 MED FILL — FENOFIBRATE 54 MG TABLET: 54 | 30 days supply | Qty: 30 | Fill #0

## 2017-11-27 MED FILL — METHOCARBAMOL 500 MG TABLET: 500 | 7 days supply | Qty: 30 | Fill #0

## 2017-11-27 MED FILL — GABAPENTIN 300 MG CAPSULE: 300 | 10 days supply | Qty: 30 | Fill #0

## 2017-11-27 MED FILL — oxyCODONE HCL 5 MG TABS: 5 | 4 days supply | Qty: 50 | Fill #0

## 2017-11-27 MED FILL — XARELTO 10 MG TABLET: 10 | 21 days supply | Qty: 21 | Fill #0

## 2017-11-27 MED FILL — DOK 100 MG SOFTGEL: 100 | 50 days supply | Qty: 100 | Fill #0

## 2017-11-27 NOTE — Progress Notes (Signed)
Orthopedic Tech Progress Note Patient Details:  Warner Laduca Oct 30, 1960 [id number]  Patient ID: [name], male   DOB: [birth year], 57 y.o.   MRN: [id number]   Saul Fordyce 11/27/2017, 11:51 AMTrapeze bar discontinued.

## 2017-11-27 NOTE — Progress Notes (Signed)
Physical Therapy Treatment Patient Details Name: Derek Conner MRN: [id number] DOB: [birth year] Today's Date: 11/27/2017    History of Present Illness 57 y.o. male s/p L THA anterior approach 4/24. PMH Includes: Stroke, RA, PNA, GERD, COPD, Asthma, HTN, Foot fx surgery and R THA 2/19     PT Comments    Patient progressing well with therapy this visit. Ambulating further distances with supervision, 200' this visit. Reviewed standing therex. Plan to do step training tomorrow AM, anticipate pt will do well and be ready for safe return home when medically cleared for d/c.    Follow Up Recommendations  Follow surgeon's recommendation for DC plan and follow-up therapies;Supervision/Assistance - 24 hour;Home health PT     Equipment Recommendations  None recommended by PT    Recommendations for Other Services       Precautions / Restrictions Precautions Precautions: Fall Restrictions Weight Bearing Restrictions: Yes LLE Weight Bearing: Weight bearing as tolerated    Mobility  Bed Mobility Overal bed mobility: Modified Independent                Transfers Overall transfer level: Needs assistance Equipment used: Rolling walker (2 wheeled) Transfers: Sit to/from Stand Sit to Stand: Min guard         General transfer comment: increased time and effort, min guard for safety.   Ambulation/Gait Ambulation/Gait assistance: Supervision Ambulation Distance (Feet): 200 Feet Assistive device: Rolling walker (2 wheeled) Gait Pattern/deviations: Step-through pattern Gait velocity: decrased   General Gait Details: increased distance, progressed to supervision. step through gait. increased velocity.   Stairs             Wheelchair Mobility    Modified Rankin (Stroke Patients Only)       Balance Overall balance assessment: Needs assistance   Sitting balance-Leahy Scale: Good       Standing balance-Leahy Scale: Fair                               Cognition Arousal/Alertness: Awake/alert Behavior During Therapy: WFL for tasks assessed/performed Overall Cognitive Status: Within Functional Limits for tasks assessed                                        Exercises Total Joint Exercises Hip ABduction/ADduction: 10 reps Knee Flexion: 10 reps Marching in Standing: 10 reps Standing Hip Extension: 10 reps    General Comments        Pertinent Vitals/Pain Pain Assessment: 0-10 Pain Score: 7  Pain Location: L hip  Pain Descriptors / Indicators: Aching;Operative site guarding Pain Intervention(s): Limited activity within patient's tolerance;Premedicated before session;Monitored during session;Repositioned    Home Living                      Prior Function            PT Goals (current goals can now be found in the care plan section) Acute Rehab PT Goals Patient Stated Goal: return home with HHPT and care from wife PT Goal Formulation: With patient Time For Goal Achievement: 12/04/17 Potential to Achieve Goals: Good Progress towards PT goals: Progressing toward goals    Frequency    7X/week      PT Plan Current plan remains appropriate    Co-evaluation              AM-PAC  PT "6 Clicks" Daily Activity  Outcome Measure  Difficulty turning over in bed (including adjusting bedclothes, sheets and blankets)?: A Little Difficulty moving from lying on back to sitting on the side of the bed? : A Little Difficulty sitting down on and standing up from a chair with arms (e.g., wheelchair, bedside commode, etc,.)?: A Little Help needed moving to and from a bed to chair (including a wheelchair)?: A Little Help needed walking in hospital room?: A Little Help needed climbing 3-5 steps with a railing? : A Little 6 Click Score: 18    End of Session Equipment Utilized During Treatment: Gait belt Activity Tolerance: Patient tolerated treatment well Patient left: in bed;with call bell/phone  within reach;with family/visitor present Nurse Communication: Mobility status PT Visit Diagnosis: Unsteadiness on feet (R26.81);Other abnormalities of gait and mobility (R26.89);Pain;Muscle weakness (generalized) (M62.81) Pain - Right/Left: Left Pain - part of body: Hip     Time: 7017-7939 PT Time Calculation (min) (ACUTE ONLY): 18 min  Charges:  $Gait Training: 8-22 mins                    G Codes:      Etta Grandchild, PT, DPT Acute Rehab Services Pager: [phone]     Etta Grandchild 11/27/2017, 4:54 PM

## 2017-11-27 NOTE — Progress Notes (Signed)
Physical Therapy Treatment Patient Details Name: Derek Conner MRN: [id number] DOB: Mar 31, 1961 Today's Date: 11/27/2017    History of Present Illness 57 y.o. male s/p L THA anterior approach 4/24. PMH Includes: Stroke, RA, PNA, GERD, COPD, Asthma, HTN, Foot fx surgery and R THA 2/19     PT Comments    Patient is s/p above surgery resulting in functional limitations due to the deficits listed below (see PT Problem List). PTA, pt living with wife, mod I with SPC s/p R THA 2/19. Upon eval pt presents with post op pain and weakness. Min guard for OOB mobility and ambulating short distances into hallway this visit. reveiwed therex, and will progress activity this afternoon.  Patient will benefit from skilled PT to increase their independence and safety with mobility to allow discharge to the venue listed below.      Follow Up Recommendations  Follow surgeon's recommendation for DC plan and follow-up therapies;Supervision/Assistance - 24 hour;Home health PT     Equipment Recommendations  None recommended by PT    Recommendations for Other Services       Precautions / Restrictions Precautions Precautions: Fall Restrictions Weight Bearing Restrictions: Yes LLE Weight Bearing: Weight bearing as tolerated    Mobility  Bed Mobility Overal bed mobility: Modified Independent                Transfers Overall transfer level: Needs assistance Equipment used: Rolling walker (2 wheeled) Transfers: Sit to/from Stand Sit to Stand: Min guard         General transfer comment: increased time and effort, min guard for safety.   Ambulation/Gait Ambulation/Gait assistance: Min guard Ambulation Distance (Feet): 50 Feet Assistive device: Rolling walker (2 wheeled) Gait Pattern/deviations: Step-to pattern;Step-through pattern Gait velocity: decreased   General Gait Details: intermittent step through, patient with slow and painful gait. short distance due to pain.    Stairs              Wheelchair Mobility    Modified Rankin (Stroke Patients Only)       Balance Overall balance assessment: Needs assistance   Sitting balance-Leahy Scale: Good       Standing balance-Leahy Scale: Fair                              Cognition Arousal/Alertness: Awake/alert Behavior During Therapy: WFL for tasks assessed/performed Overall Cognitive Status: Within Functional Limits for tasks assessed                                        Exercises Total Joint Exercises Ankle Circles/Pumps: 20 reps Quad Sets: 10 reps Heel Slides: 10 reps    General Comments        Pertinent Vitals/Pain Pain Assessment: 0-10 Pain Score: 7  Pain Location: L hip  Pain Descriptors / Indicators: Aching;Operative site guarding Pain Intervention(s): Limited activity within patient's tolerance;Monitored during session;Premedicated before session;Repositioned    Home Living Family/patient expects to be discharged to:: Private residence Living Arrangements: Spouse/significant other Available Help at Discharge: Family;Available 24 hours/day   Home Access: Stairs to enter Entrance Stairs-Rails: None Home Layout: One level Home Equipment: Walker - 2 wheels;Bedside commode;Shower seat;Crutches      Prior Function Level of Independence: Independent with assistive device(s)          PT Goals (current goals can now be found in the  care plan section) Acute Rehab PT Goals Patient Stated Goal: return home with HHPT and care from wife PT Goal Formulation: With patient Time For Goal Achievement: 12/04/17 Potential to Achieve Goals: Good    Frequency    7X/week      PT Plan      Co-evaluation              AM-PAC PT "6 Clicks" Daily Activity  Outcome Measure  Difficulty turning over in bed (including adjusting bedclothes, sheets and blankets)?: A Little Difficulty moving from lying on back to sitting on the side of the bed? : A  Little Difficulty sitting down on and standing up from a chair with arms (e.g., wheelchair, bedside commode, etc,.)?: A Little Help needed moving to and from a bed to chair (including a wheelchair)?: A Little Help needed walking in hospital room?: A Little Help needed climbing 3-5 steps with a railing? : A Lot 6 Click Score: 17    End of Session Equipment Utilized During Treatment: Gait belt Activity Tolerance: Patient tolerated treatment well Patient left: in bed;with call bell/phone within reach;with family/visitor present Nurse Communication: Mobility status PT Visit Diagnosis: Unsteadiness on feet (R26.81);Other abnormalities of gait and mobility (R26.89);Pain;Muscle weakness (generalized) (M62.81) Pain - Right/Left: Left Pain - part of body: Hip     Time: 4128-7867 PT Time Calculation (min) (ACUTE ONLY): 24 min  Charges:  $Gait Training: 8-22 mins                    G Codes:      Etta Grandchild, PT, DPT Acute Rehab Services Pager: [phone]    Etta Grandchild 11/27/2017, 11:26 AM

## 2017-11-27 NOTE — Op Note (Signed)
NAME:  Derek Conner, Derek Conner NO.:  MEDICAL RECORD NO.:  [id number]  LOCATION:                                 FACILITY:  PHYSICIAN:  Burnard Bunting, M.D.    DATE OF BIRTH:  08/02/1961  DATE OF PROCEDURE: DATE OF DISCHARGE:                              OPERATIVE REPORT   PREOPERATIVE DIAGNOSIS:  Left hip arthritis.  POSTOPERATIVE DIAGNOSIS:  Left hip arthritis.  PROCEDURE:  Left total hip replacement.  SURGEON:  Burnard Bunting, M.D.  ASSISTANT:  April Chilton Si, RNFA.  COMPONENTS UTILIZED:  DePuy Corail stem 16KA with press-fit.  A 32-mm ceramic +5 head with a 56 pinnacle cup, 1 screw, +4 liner.  INDICATIONS:  Derek Conner is a 57 year old patient with avascular necrosis of the left hip.  He presents for operative management after explanation of risks and benefits.  PROCEDURE IN DETAIL:  The patient was brought to the operating room where general endotracheal anesthesia was induced.  Preop antibiotics were administered.  Time-out was called.  Left leg was prescrubbed with alcohol and Betadine and allowed to air dry, prepped with DuraPrep solution, draped in a sterile manner.  The patient was placed on the Hana bed.  The [name] was used to cover the operative field.  Time-out was called.  Incision was made 2 cm posterior and distal to the anterior superior iliac crest.  Skin and subcutaneous tissue were sharply divided.  The fascia lata was encountered and divided in its midportion from the iliac crest distally along the length of the incision.  The fascia was then mobilized anteriorly.  Interval between the tensor fascia lata muscle and the rectus was developed.  Crossing circumflex vessels were coagulated.  Retractors placed on the superior and inferior aspect of the femoral neck.  The capsulotomy was made and tagged. Femoral neck cut was then made under direct visualization.  Slightly lower than the right-sided femoral neck cut.  At this time, attention was  directed towards the acetabulum.  The labrum was excised.  Pulvinar was excised.  The reaming was then performed under fluoroscopic guidance in approximately 45 degrees of abduction and 15 degrees of anteversion. The reaming was taken up to a size 55 and a 56-mm cup was placed with excellent purchase and one screw was placed as well.  +4 liner then placed.  Attention directed towards the femur.  Femoral hook was placed. The conjoint tendon was released.  The [name] retractor was placed around the inferior neck __________ adducted and extended.  Trochanteric retractor placed.  Broaching performed up to a size 16.  With the 16 trial in position, the patient was reduced with both a +1.5 and a +5 neck.  The +5 neck gave the best stability.  At this time, trial components removed from the femur.  True components placed with same stability parameters maintained.  The patient was stable with 60 degrees of external rotation and 60 degrees of extension.  Also stable with internal rotation.  Shuck test demonstrated less than 5 mm of __________.  Excellent stability obtained.  Thorough irrigation again performed.  Tranexamic acid sponge placed within the incision for 3 minutes.  Then, the capsule was closed  using #1 Vicryl suture followed by closure of the fascia lata using #1 Vicryl suture followed by interrupted inverted 0 Vicryl suture, 2-0 Vicryl suture, and a 3-0 Monocryl.  Aquacel dressing placed.  Leg lengths approximately equal at the conclusion of the case.  There were almost exactly equal on radiographs.  The patient tolerated the procedure well without immediate complication, transferred to the recovery room in stable condition.     Burnard Bunting, M.D.     GSD/MEDQ  D:  11/26/2017  T:  11/26/2017  Job:  [id number]

## 2017-11-27 NOTE — Progress Notes (Signed)
Subjective: Pt stable - pain ok   Objective: Vital signs in last 24 hours: Temp:  [97.2 F (36.2 C)-98.2 F (36.8 C)] 98.2 F (36.8 C) (04/25 0543) Pulse Rate:  [74-89] 84 (04/25 0543) Resp:  [12-18] 16 (04/25 0543) BP: (117-150)/(74-96) 117/74 (04/25 0543) SpO2:  [94 %-100 %] 99 % (04/25 0543)  Intake/Output from previous day: 04/24 0701 - 04/25 0700 In: 2450 [P.O.:240; I.V.:2035; IV Piggyback:100] Out: 550 [Urine:300; Blood:250] Intake/Output this shift: No intake/output data recorded.  Exam:  Dorsiflexion/Plantar flexion intact  Labs: No results for input(s): HGB in the last 72 hours. No results for input(s): WBC, RBC, HCT, PLT in the last 72 hours. No results for input(s): NA, K, CL, CO2, BUN, CREATININE, GLUCOSE, CALCIUM in the last 72 hours. No results for input(s): LABPT, INR in the last 72 hours.  Assessment/Plan: Plan for pt today possible dc am   G Dorene Grebe 11/27/2017, 1:43 PM

## 2017-11-28 ENCOUNTER — Ambulatory Visit: Payer: 59 | Admitting: Rheumatology

## 2017-11-28 NOTE — Progress Notes (Signed)
Physical Therapy Treatment and Discharge Patient Details Name: Derek Conner MRN: [id number] DOB: 08-03-1961 Today's Date: 11/28/2017    History of Present Illness 57 y.o. male s/p L THA anterior approach 4/24. PMH Includes: Stroke, RA, PNA, GERD, COPD, Asthma, HTN, Foot fx surgery and R THA 2/19     PT Comments    Patient progressing well with therapy. Supervision level for all mobility including stairs at this time. Pt and family educated on safety considerations for home and have no further questions or concerns at this time. Pt has met all functional goals and will benefit from skilled home health PT when medically cleared for d/c.     Follow Up Recommendations  Follow surgeon's recommendation for DC plan and follow-up therapies;Supervision/Assistance - 24 hour;Home health PT     Equipment Recommendations  None recommended by PT    Recommendations for Other Services       Precautions / Restrictions Precautions Precautions: Fall Restrictions Weight Bearing Restrictions: Yes LLE Weight Bearing: Weight bearing as tolerated    Mobility  Bed Mobility Overal bed mobility: Modified Independent                Transfers Overall transfer level: Needs assistance Equipment used: Rolling walker (2 wheeled) Transfers: Sit to/from Stand Sit to Stand: Supervision         General transfer comment: increasd time and effort, supervision for safety  Ambulation/Gait Ambulation/Gait assistance: Supervision Ambulation Distance (Feet): 120 Feet Assistive device: Rolling walker (2 wheeled) Gait Pattern/deviations: Step-through pattern Gait velocity: decrased   General Gait Details: patient with step through gait. supervision for safety   Stairs Stairs: Yes Stairs assistance: Supervision Stair Management: No rails;Forwards;With walker Number of Stairs: 4 General stair comments: 4 platform stairs, cues for seqeuncing with RW. supervision for safety.   Wheelchair  Mobility    Modified Rankin (Stroke Patients Only)       Balance Overall balance assessment: Needs assistance   Sitting balance-Leahy Scale: Good       Standing balance-Leahy Scale: Fair                              Cognition Arousal/Alertness: Awake/alert Behavior During Therapy: WFL for tasks assessed/performed Overall Cognitive Status: Within Functional Limits for tasks assessed                                        Exercises Total Joint Exercises Hip ABduction/ADduction: 10 reps Knee Flexion: 10 reps Marching in Standing: 10 reps Standing Hip Extension: 10 reps    General Comments        Pertinent Vitals/Pain Pain Assessment: 0-10 Pain Score: 7  Pain Location: L hip  Pain Descriptors / Indicators: Aching;Operative site guarding Pain Intervention(s): Limited activity within patient's tolerance;Premedicated before session;Monitored during session;Repositioned    Home Living                      Prior Function            PT Goals (current goals can now be found in the care plan section) Acute Rehab PT Goals Patient Stated Goal: return home with HHPT and care from wife PT Goal Formulation: With patient Time For Goal Achievement: 12/04/17 Potential to Achieve Goals: Good Progress towards PT goals: Progressing toward goals    Frequency    7X/week  PT Plan Current plan remains appropriate    Co-evaluation              AM-PAC PT "6 Clicks" Daily Activity  Outcome Measure  Difficulty turning over in bed (including adjusting bedclothes, sheets and blankets)?: A Little Difficulty moving from lying on back to sitting on the side of the bed? : A Little Difficulty sitting down on and standing up from a chair with arms (e.g., wheelchair, bedside commode, etc,.)?: A Little Help needed moving to and from a bed to chair (including a wheelchair)?: A Little Help needed walking in hospital room?: A Little Help  needed climbing 3-5 steps with a railing? : A Little 6 Click Score: 18    End of Session Equipment Utilized During Treatment: Gait belt Activity Tolerance: Patient tolerated treatment well Patient left: in bed;with call bell/phone within reach;with family/visitor present Nurse Communication: Mobility status PT Visit Diagnosis: Unsteadiness on feet (R26.81);Other abnormalities of gait and mobility (R26.89);Pain;Muscle weakness (generalized) (M62.81) Pain - Right/Left: Left Pain - part of body: Hip     Time: 0812-0835 PT Time Calculation (min) (ACUTE ONLY): 23 min  Charges:  $Gait Training: 8-22 mins                    G Codes:       Reinaldo Berber, PT, DPT Acute Rehab Services Pager: [phone]     Reinaldo Berber 11/28/2017, 8:40 AM

## 2017-11-28 NOTE — Care Management Note (Signed)
Case Management Note  Patient Details  Name: Derek Conner MRN: [id number] Date of Birth: [birth year]  Subjective/Objective:                    Action/Plan:   Expected Discharge Date:  11/28/17               Expected Discharge Plan:  Home w Home Health Services  In-House Referral:  NA  Discharge planning Services  CM Consult  Post Acute Care Choice:  Home Health Choice offered to:  Patient, Spouse  DME Arranged:  (Has RW and 3in1) DME Agency:  NA  HH Arranged:  PT HH Agency:  [facility]  Status of Service:  Completed, signed off  If discussed at Long Length of Stay Meetings, dates discussed:    Additional Comments:  Durenda Guthrie, RN 11/28/2017, 10:20 AM

## 2017-11-28 NOTE — Progress Notes (Signed)
Pt discharge instructions reviewed with pt. Pt verbalizes understanding and states no questions. Pt belongings with pt. Pt is not in distress. Pt's wife is driving him home. Pt discharged via wheelchair.

## 2017-11-28 NOTE — Progress Notes (Signed)
Subjective: Patient stable.  Was able to get up in his bathroom last night   Objective: Vital signs in last 24 hours: Temp:  [98.6 F (37 C)-100.7 F (38.2 C)] 99.8 F (37.7 C) (04/26 0533) Pulse Rate:  [91-107] 106 (04/26 0533) Resp:  [12-16] 16 (04/26 0533) BP: (125-140)/(62-87) 140/87 (04/26 0533) SpO2:  [93 %-96 %] 93 % (04/26 0533)  Intake/Output from previous day: 04/25 0701 - 04/26 0700 In: 720 [P.O.:720] Out: -  Intake/Output this shift: No intake/output data recorded.  Exam:  Dorsiflexion/Plantar flexion intact  Labs: No results for input(s): HGB in the last 72 hours. No results for input(s): WBC, RBC, HCT, PLT in the last 72 hours. No results for input(s): NA, K, CL, CO2, BUN, CREATININE, GLUCOSE, CALCIUM in the last 72 hours. No results for input(s): LABPT, INR in the last 72 hours.  Assessment/Plan: Plan is possible discharge this afternoon after physical therapy this morning.  Prescriptions on chart.  Follow-up with me in 10 days   G Dorene Grebe 11/28/2017, 7:31 AM

## 2017-11-28 NOTE — Plan of Care (Signed)
  Problem: Education: Goal: Knowledge of General Education information will improve Outcome: Progressing   Problem: Activity: Goal: Risk for activity intolerance will decrease Outcome: Progressing   Problem: Nutrition: Goal: Adequate nutrition will be maintained Outcome: Progressing   Problem: Elimination: Goal: Will not experience complications related to bowel motility Outcome: Progressing   Problem: Skin Integrity: Goal: Risk for impaired skin integrity will decrease Outcome: Progressing   Problem: Clinical Measurements: Goal: Will remain free from infection Outcome: Progressing

## 2017-12-01 ENCOUNTER — Ambulatory Visit: Payer: 59 | Admitting: Internal Medicine

## 2017-12-01 NOTE — Discharge Summary (Signed)
Physician Discharge Summary  Patient ID: Derek Conner MRN: [id number] DOB/AGE: 08-Oct-1960 57 y.o.  Admit date: 11/26/2017 Discharge date: 11/28/2017  Admission Diagnoses:  Active Problems:   Hip arthritis   Discharge Diagnoses:  Same  Surgeries: Procedure(s): LEFT TOTAL HIP ARTHROPLASTY ANTERIOR APPROACH on 11/26/2017   Consultants:   Discharged Condition: Stable  Hospital Course: Derek Conner is an 57 y.o. male who was admitted 11/26/2017 with a chief complaint of left hip pain, and found to have a diagnosis of left hip arthritis.  They were brought to the operating room on 11/26/2017 and underwent the above named procedures.  Patient tolerated the procedure well without immediate complications.  He was transferred to the recovery room in stable condition.  Physical therapy started the following day with mobilization weightbearing as tolerated.  Patient's pain was controlled by postop day #2.  He is discharged home in good condition.  Discharge home on Xarelto for DVT prophylaxis along with pain medicine and muscle relaxers.  He will follow-up with me in 10 days for clinical recheck.  Antibiotics given:  Anti-infectives (From admission, onward)   Start     Dose/Rate Route Frequency Ordered Stop   11/26/17 1800  ceFAZolin (ANCEF) IVPB 2g/100 mL premix     2 g 200 mL/hr over 30 Minutes Intravenous Every 6 hours 11/26/17 1653 11/26/17 2359   11/26/17 1055  ceFAZolin (ANCEF) 2-4 GM/100ML-% IVPB    Note to Pharmacy:  Marrianne Mood   : cabinet override      11/26/17 1055 11/26/17 1250   11/26/17 1052  ceFAZolin (ANCEF) IVPB 2g/100 mL premix     2 g 200 mL/hr over 30 Minutes Intravenous On call to O.R. 11/26/17 1053 11/26/17 1255    .  Recent vital signs:  Vitals:   11/27/17 2047 11/28/17 0533  BP: 129/62 140/87  Pulse: (!) 107 (!) 106  Resp: 16 16  Temp: (!) 100.7 F (38.2 C) 99.8 F (37.7 C)  SpO2: 93% 93%    Recent laboratory studies:  Results for orders  placed or performed during the hospital encounter of 11/13/17  Surgical pcr screen  Result Value Ref Range   MRSA, PCR NEGATIVE NEGATIVE   Staphylococcus aureus NEGATIVE NEGATIVE  Urine culture  Result Value Ref Range   Specimen Description URINE, CLEAN CATCH    Special Requests NONE    Culture      NO GROWTH Performed at [facility], [address] [place], [place] [address]    Report Status 11/14/2017 FINAL   Basic metabolic panel  Result Value Ref Range   Sodium 136 135 - 145 mmol/L   Potassium 4.2 3.5 - 5.1 mmol/L   Chloride 103 101 - 111 mmol/L   CO2 22 22 - 32 mmol/L   Glucose, Bld 108 (H) 65 - 99 mg/dL   BUN 11 6 - 20 mg/dL   Creatinine, Ser 1.91 0.61 - 1.24 mg/dL   Calcium 9.4 8.9 - 47.8 mg/dL   GFR calc non Af Amer >60 >60 mL/min   GFR calc Af Amer >60 >60 mL/min   Anion gap 11 5 - 15  CBC  Result Value Ref Range   WBC 8.3 4.0 - 10.5 K/uL   RBC 4.57 4.22 - 5.81 MIL/uL   Hemoglobin 12.6 (L) 13.0 - 17.0 g/dL   HCT 29.5 62.1 - 30.8 %   MCV 87.5 78.0 - 100.0 fL   MCH 27.6 26.0 - 34.0 pg   MCHC 31.5 30.0 - 36.0 g/dL  RDW 14.0 11.5 - 15.5 %   Platelets 547 (H) 150 - 400 K/uL  Urinalysis, Routine w reflex microscopic  Result Value Ref Range   Color, Urine YELLOW YELLOW   APPearance CLEAR CLEAR   Specific Gravity, Urine 1.024 1.005 - 1.030   pH 6.0 5.0 - 8.0   Glucose, UA NEGATIVE NEGATIVE mg/dL   Hgb urine dipstick NEGATIVE NEGATIVE   Bilirubin Urine NEGATIVE NEGATIVE   Ketones, ur NEGATIVE NEGATIVE mg/dL   Protein, ur NEGATIVE NEGATIVE mg/dL   Nitrite NEGATIVE NEGATIVE   Leukocytes, UA NEGATIVE NEGATIVE  Type and screen Order type and screen if day of surgery is less than 15 days from draw of preadmission visit or order morning of surgery if day of surgery is greater than 6 days from preadmission visit.  Result Value Ref Range   ABO/RH(D) O POS    Antibody Screen NEG    Sample Expiration 11/27/2017    Extend sample reason      NO TRANSFUSIONS OR  PREGNANCY IN THE PAST 3 MONTHS Performed at [facility], [address] [place], [place] [address]     Discharge Medications:   Allergies as of 11/28/2017      Reactions   Symbicort [budesonide-formoterol Fumarate] Shortness Of Breath, Nausea And Vomiting, Other (See Comments)   heachache   Dulera [mometasone Furo-formoterol Fum] Other (See Comments)   Headache   Humira [adalimumab] Nausea And Vomiting   Headaches       Medication List    STOP taking these medications   ALPRAZolam 0.5 MG tablet Commonly known as:  XANAX   aspirin EC 81 MG tablet   ticagrelor 90 MG Tabs tablet Commonly known as:  BRILINTA     TAKE these medications   acetaminophen 500 MG tablet Commonly known as:  TYLENOL Take 1,000 mg by mouth daily as needed for moderate pain or headache.   ADVAIR DISKUS 500-50 MCG/DOSE Aepb Generic drug:  Fluticasone-Salmeterol INHALE 1 PUFF BY MOUTH INTO THE LUNGS 2 (TWO) TIMES DAILY.   albuterol 108 (90 Base) MCG/ACT inhaler Commonly known as:  PROVENTIL HFA;VENTOLIN HFA Inhale 2 puffs into the lungs every 4 (four) hours as needed for wheezing or shortness of breath.   albuterol (2.5 MG/3ML) 0.083% nebulizer solution Commonly known as:  PROVENTIL Take 3 mLs (2.5 mg total) by nebulization every 6 (six) hours as needed for wheezing or shortness of breath. DX: J44.9   amLODipine 10 MG tablet Commonly known as:  NORVASC TAKE 1 TABLET (10 MG) BY MOUTH DAILY. What changed:  See the new instructions.   b complex vitamins tablet Take 1 tablet by mouth daily.   cloNIDine 0.2 MG tablet Commonly known as:  CATAPRES Take 1 tablet (0.2 mg total) by mouth 2 (two) times daily.   DALIRESP 500 MCG Tabs tablet Generic drug:  roflumilast TAKE 1 TABLET (500 MCG TOTAL) BY MOUTH DAILY.   docusate sodium 100 MG capsule Commonly known as:  COLACE Take 1 capsule (100 mg total) by mouth 2 (two) times daily.   famotidine 20 MG tablet Commonly known as:  PEPCID TAKE  1 TABLET (20 MG TOTAL) BY MOUTH AT BEDTIME.   fenofibrate 54 MG tablet Take 1 tablet (54 mg total) by mouth daily. What changed:    medication strength  how much to take   gabapentin 300 MG capsule Commonly known as:  NEURONTIN Take 1 capsule (300 mg total) by mouth 3 (three) times daily.   methocarbamol 500 MG tablet  Commonly known as:  ROBAXIN Take 1 tablet (500 mg total) by mouth every 6 (six) hours as needed for muscle spasms.   oxyCODONE 5 MG immediate release tablet Commonly known as:  Oxy IR/ROXICODONE Take 1-2 tablets (5-10 mg total) by mouth every 4 (four) hours as needed for moderate pain (pain score 4-6). What changed:    how much to take  how to take this  when to take this  reasons to take this  additional instructions   OXYGEN Inhale 2 L into the lungs at bedtime.   pantoprazole 40 MG tablet Commonly known as:  PROTONIX TAKE 1 TABLET (40 MG TOTAL) BY MOUTH DAILY AT 12:00 NOON.   rivaroxaban 10 MG Tabs tablet Commonly known as:  XARELTO Take 1 tablet (10 mg total) by mouth daily with breakfast.   tiotropium 18 MCG inhalation capsule Commonly known as:  SPIRIVA HANDIHALER PLACE 1 CAPSULE INTO INHALER AND INHALE INTO LUNGS DAILY.       Diagnostic Studies: Dg C-arm 1-60 Min  Result Date: 11/26/2017 CLINICAL DATA:  Status post left hip arthroplasty EXAM: DG C-ARM 61-120 MIN; OPERATIVE LEFT HIP WITH PELVIS COMPARISON:  10/06/2017 FINDINGS: Seven low resolution intraoperative spot views of the left hip. Total fluoroscopy time was 43 seconds. Prior right hip replacement. Images demonstrate subsequent left hip replacement with normal alignment. IMPRESSION: Intraoperative fluoroscopic assistance provided during left hip replacement Electronically Signed   By: Jasmine Pang M.D.   On: 11/26/2017 17:09   Dg C-arm 1-60 Min  Result Date: 11/26/2017 CLINICAL DATA:  Status post left hip arthroplasty EXAM: DG C-ARM 61-120 MIN; OPERATIVE LEFT HIP WITH PELVIS  COMPARISON:  10/06/2017 FINDINGS: Seven low resolution intraoperative spot views of the left hip. Total fluoroscopy time was 43 seconds. Prior right hip replacement. Images demonstrate subsequent left hip replacement with normal alignment. IMPRESSION: Intraoperative fluoroscopic assistance provided during left hip replacement Electronically Signed   By: Jasmine Pang M.D.   On: 11/26/2017 17:09   Dg C-arm 1-60 Min  Result Date: 11/26/2017 CLINICAL DATA:  Status post left hip arthroplasty EXAM: DG C-ARM 61-120 MIN; OPERATIVE LEFT HIP WITH PELVIS COMPARISON:  10/06/2017 FINDINGS: Seven low resolution intraoperative spot views of the left hip. Total fluoroscopy time was 43 seconds. Prior right hip replacement. Images demonstrate subsequent left hip replacement with normal alignment. IMPRESSION: Intraoperative fluoroscopic assistance provided during left hip replacement Electronically Signed   By: Jasmine Pang M.D.   On: 11/26/2017 17:09   Dg Hip Port Unilat With Pelvis 1v Left  Result Date: 11/26/2017 CLINICAL DATA:  Status post left hip replacement today. EXAM: DG HIP (WITH OR WITHOUT PELVIS) 1V PORT LEFT COMPARISON:  Intraoperative imaging this same day. FINDINGS: New left total hip arthroplasty is in place. The device is located. No fracture. There is some gas in the soft tissues from surgery. IMPRESSION: Status post left hip replacement.  No acute abnormality. Electronically Signed   By: Drusilla Kanner M.D.   On: 11/26/2017 18:53   Dg Hip Operative Unilat W Or W/o Pelvis Left  Result Date: 11/26/2017 CLINICAL DATA:  Status post left hip arthroplasty EXAM: DG C-ARM 61-120 MIN; OPERATIVE LEFT HIP WITH PELVIS COMPARISON:  10/06/2017 FINDINGS: Seven low resolution intraoperative spot views of the left hip. Total fluoroscopy time was 43 seconds. Prior right hip replacement. Images demonstrate subsequent left hip replacement with normal alignment. IMPRESSION: Intraoperative fluoroscopic assistance provided  during left hip replacement Electronically Signed   By: Jasmine Pang M.D.   On: 11/26/2017  17:09    Disposition:   Discharge Instructions    Call MD / Call 911   Complete by:  As directed    If you experience chest pain or shortness of breath, CALL 911 and be transported to the hospital emergency room.  If you develope a fever above 101 F, pus (white drainage) or increased drainage or redness at the wound, or calf pain, call your surgeon's office.   Call MD / Call 911   Complete by:  As directed    If you experience chest pain or shortness of breath, CALL 911 and be transported to the hospital emergency room.  If you develope a fever above 101 F, pus (white drainage) or increased drainage or redness at the wound, or calf pain, call your surgeon's office.   Constipation Prevention   Complete by:  As directed    Drink plenty of fluids.  Prune juice may be helpful.  You may use a stool softener, such as Colace (over the counter) 100 mg twice a day.  Use MiraLax (over the counter) for constipation as needed.   Constipation Prevention   Complete by:  As directed    Drink plenty of fluids.  Prune juice may be helpful.  You may use a stool softener, such as Colace (over the counter) 100 mg twice a day.  Use MiraLax (over the counter) for constipation as needed.   Diet - low sodium heart healthy   Complete by:  As directed    Diet - low sodium heart healthy   Complete by:  As directed    Discharge instructions   Complete by:  As directed    Weight bearing as tolerated with crutches or walker Ok to shower - dressing waterproof   Increase activity slowly as tolerated   Complete by:  As directed    Increase activity slowly as tolerated   Complete by:  As directed       Follow-up Information    [facility]-Home Follow up.   Specialty:  Home Health Services Why:  A representative from [facility] will contact you to arrange start date and time for your therapy. Contact  information: [address] [place] [address] [phone]            Signed: Burnard Bunting 12/01/2017, 5:14 PM

## 2017-12-02 ENCOUNTER — Other Ambulatory Visit: Payer: Self-pay | Admitting: *Deleted

## 2017-12-02 NOTE — Patient Outreach (Signed)
[facility]) Care Management  12/02/2017  Derek Conner January 14, 1961 [id number]   Subjective: Telephone call to patient's home / mobile number, spoke with patient, and HIPAA verified.  Discussed [facility] Care Management [facility]MR Transition of care follow up, patient voiced understanding, and is in agreement to follow up.    Patient states he is doing well, remembers speaking with this RNCM in the past, pain being managed with pain medications, and wife will call today to schedule surgeon follow up appointment.   Patient states he is able to manage self care and has assistance as needed with activities of daily living / home management.   Patient voices understanding of medical diagnosis, surgery, and treatment plan.  States he is accessing the following [facility] benefits: outpatient pharmacy, hospital indemnity (not chosen benefit), and wife has family medical leave act Engineer, maintenance (IT)) in place.  Patient states he does not have any education material, transition of care, care coordination, disease management, disease monitoring, transportation, community resource, or pharmacy needs at this time. States he is very appreciative of the follow up and is in agreement to receive [facility] Care Management information.    Objective:Per KPN (Knowledge Performance Now, point of care tool) and chart review,patient hospitalized 11/26/17 -11/28/17 for Left hip arthritis.   Status post  Left total hip replacement on 11/26/17.   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   Status post RIGHT TOTAL HIP ARTHROPLASTY ANTERIOR APPROACHat [facility] hospital on 09/23/17.Patient hospitalized 03/27/16 - 03/29/16 for Brain aneurysm. 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 Patient also has a history of hypertension, COPD, and Rheumatoid arthritis.[facility] Care Management completed transition of care follow up on  09/25/17.     Assessment:Received UMR  Transition of Care referral on 11/26/17.Transition of care follow up completed, no care management needs, and will proceed with case closure.       Plan:RNCM will send patient successful outreach letter, [facility] pamphlet, and magnet. RNCM will complete case closure due to follow up completed / no care management needs.         Iowa Kappes H. Gardiner Barefoot, BSN, CCM [facility] Care Management [facility] Telephonic CM Phone: [phone] Fax: [phone]

## 2017-12-03 ENCOUNTER — Telehealth (INDEPENDENT_AMBULATORY_CARE_PROVIDER_SITE_OTHER): Payer: Self-pay | Admitting: Orthopedic Surgery

## 2017-12-03 DIAGNOSIS — J449 Chronic obstructive pulmonary disease, unspecified: Secondary | ICD-10-CM | POA: Diagnosis not present

## 2017-12-03 DIAGNOSIS — Z96653 Presence of artificial knee joint, bilateral: Secondary | ICD-10-CM | POA: Diagnosis not present

## 2017-12-03 DIAGNOSIS — R911 Solitary pulmonary nodule: Secondary | ICD-10-CM | POA: Diagnosis not present

## 2017-12-03 DIAGNOSIS — M069 Rheumatoid arthritis, unspecified: Secondary | ICD-10-CM | POA: Diagnosis not present

## 2017-12-03 DIAGNOSIS — K219 Gastro-esophageal reflux disease without esophagitis: Secondary | ICD-10-CM | POA: Diagnosis not present

## 2017-12-03 DIAGNOSIS — Z471 Aftercare following joint replacement surgery: Secondary | ICD-10-CM | POA: Diagnosis not present

## 2017-12-03 DIAGNOSIS — Z8701 Personal history of pneumonia (recurrent): Secondary | ICD-10-CM | POA: Diagnosis not present

## 2017-12-03 DIAGNOSIS — I10 Essential (primary) hypertension: Secondary | ICD-10-CM | POA: Diagnosis not present

## 2017-12-03 DIAGNOSIS — F17210 Nicotine dependence, cigarettes, uncomplicated: Secondary | ICD-10-CM | POA: Diagnosis not present

## 2017-12-03 NOTE — Telephone Encounter (Signed)
Tasia Catchings, PT, with [facility], called to request VO for the following:  PT for 2x a week for 1 week 3x week for 1 week 2x a week for 2 weeks.  He would like to go see him tomorrow, please advise ASAP.  CB#[phone].  Thank you.

## 2017-12-03 NOTE — Telephone Encounter (Signed)
IC verbal given.  

## 2017-12-04 DIAGNOSIS — J449 Chronic obstructive pulmonary disease, unspecified: Secondary | ICD-10-CM | POA: Diagnosis not present

## 2017-12-04 DIAGNOSIS — Z8701 Personal history of pneumonia (recurrent): Secondary | ICD-10-CM | POA: Diagnosis not present

## 2017-12-04 DIAGNOSIS — Z96653 Presence of artificial knee joint, bilateral: Secondary | ICD-10-CM | POA: Diagnosis not present

## 2017-12-04 DIAGNOSIS — M069 Rheumatoid arthritis, unspecified: Secondary | ICD-10-CM | POA: Diagnosis not present

## 2017-12-04 DIAGNOSIS — I10 Essential (primary) hypertension: Secondary | ICD-10-CM | POA: Diagnosis not present

## 2017-12-04 DIAGNOSIS — K219 Gastro-esophageal reflux disease without esophagitis: Secondary | ICD-10-CM | POA: Diagnosis not present

## 2017-12-04 DIAGNOSIS — R911 Solitary pulmonary nodule: Secondary | ICD-10-CM | POA: Diagnosis not present

## 2017-12-04 DIAGNOSIS — F17210 Nicotine dependence, cigarettes, uncomplicated: Secondary | ICD-10-CM | POA: Diagnosis not present

## 2017-12-04 DIAGNOSIS — Z471 Aftercare following joint replacement surgery: Secondary | ICD-10-CM | POA: Diagnosis not present

## 2017-12-05 ENCOUNTER — Ambulatory Visit (INDEPENDENT_AMBULATORY_CARE_PROVIDER_SITE_OTHER): Payer: 59 | Admitting: Orthopedic Surgery

## 2017-12-05 ENCOUNTER — Ambulatory Visit (INDEPENDENT_AMBULATORY_CARE_PROVIDER_SITE_OTHER): Payer: 59

## 2017-12-05 ENCOUNTER — Ambulatory Visit
Admission: RE | Admit: 2017-12-05 | Discharge: 2017-12-05 | Disposition: A | Discharge status: Home / Self Care | Payer: 59 | Source: Ambulatory Visit | Attending: Orthopedic Surgery | Admitting: Orthopedic Surgery

## 2017-12-05 ENCOUNTER — Encounter (INDEPENDENT_AMBULATORY_CARE_PROVIDER_SITE_OTHER): Payer: Self-pay | Admitting: Orthopedic Surgery

## 2017-12-05 ENCOUNTER — Telehealth (INDEPENDENT_AMBULATORY_CARE_PROVIDER_SITE_OTHER): Payer: Self-pay

## 2017-12-05 DIAGNOSIS — Z96642 Presence of left artificial hip joint: Secondary | ICD-10-CM

## 2017-12-05 DIAGNOSIS — J441 Chronic obstructive pulmonary disease with (acute) exacerbation: Secondary | ICD-10-CM | POA: Diagnosis not present

## 2017-12-05 DIAGNOSIS — R0902 Hypoxemia: Secondary | ICD-10-CM | POA: Diagnosis not present

## 2017-12-05 DIAGNOSIS — J449 Chronic obstructive pulmonary disease, unspecified: Secondary | ICD-10-CM | POA: Diagnosis not present

## 2017-12-05 DIAGNOSIS — R600 Localized edema: Secondary | ICD-10-CM | POA: Diagnosis not present

## 2017-12-05 MED ORDER — OXYCODONE HCL 5 MG PO TABS: ORAL_TABLET | ORAL | 0 refills | Status: DC

## 2017-12-05 MED FILL — oxyCODONE HCL 5 MG TABS: 5 | 7 days supply | Qty: 45 | Fill #0

## 2017-12-05 MED FILL — ADVAIR 500/50 DISKUS: 500-50 | 30 days supply | Qty: 60 | Fill #1

## 2017-12-05 NOTE — Telephone Encounter (Signed)
Marylene Land from [facility] called with venous doppler left leg report: negative for DVT.  She said the patient would like a call with these results this afternoon.

## 2017-12-05 NOTE — Telephone Encounter (Signed)
I s/w Dr August Saucer and advised. He stated to have patient continue with TEDS and stop xarelto and resume other blood thinner he was taking prior to surgery. S/w patient and verbalized understanding.

## 2017-12-06 ENCOUNTER — Encounter (INDEPENDENT_AMBULATORY_CARE_PROVIDER_SITE_OTHER): Payer: Self-pay | Admitting: Orthopedic Surgery

## 2017-12-06 NOTE — Progress Notes (Signed)
Post-Op Visit Note   Patient: Derek Conner           Date of Birth: [birth year]           MRN: [id number] Visit Date: 12/05/2017 PCP: Esperanza Richters, PA-C   Assessment & Plan:  Chief Complaint:  Chief Complaint  Patient presents with  . Left Hip - Routine Post Op   Visit Diagnoses:  1. Status post total replacement of left hip     Plan: Derek Conner is a patient is now 2 weeks out left total hip replacement.  He is having more pain and swelling with this hip than he had with the right hip.  On examination he does have some hip flexion weakness.  Leg lengths equal.  Swelling is present but is primarily in the lower leg.  Incision is intact.  Radiographs look good.  Plan is for compression hose in that left leg.  Continue with home health PT transitioning to outpatient PT.  Refill oxycodone.  Ultrasound obtained at the time of this dictation was negative for DVT.  Follow-up in 2 weeks for clinical recheck.  No x-rays at that time.  Follow-Up Instructions: Return in about 2 weeks (around 12/19/2017).   Orders:  Orders Placed This Encounter  Procedures  . XR HIP UNILAT W OR W/O PELVIS 2-3 VIEWS LEFT  . US Venous Img Lower Unilateral Left   Meds ordered this encounter  Medications  . oxyCODONE (ROXICODONE) 5 MG immediate release tablet    Sig: 1-2 po q 8hrs prn pain    Dispense:  45 tablet    Refill:  0    Imaging: No results found.  PMFS History: Patient Active Problem List   Diagnosis Date Noted  . 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 Nocturnal hypoxemia 04/03/2017  . Fatigue 01/09/2017  . Thrush 12/12/2016  . Brain aneurysm 03/27/2016  . Preop respiratory exam 03/26/2016  . Aneurysm (HCC)   . Chronic obstructive pulmonary disease with acute exacerbation (HCC) 01/19/2016  . Community acquired pneumonia 01/19/2016  . Cough syncope 01/19/2016  . Wellness examination 08/29/2015  . Hyperglycemia 01/06/2015  . HTN (hypertension) 12/09/2014  . COPD exacerbation (HCC)  07/08/2014  . Atypical chest pain 07/08/2014  . 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 Dry mouth 12/26/2013  . Cigarette smoker 12/26/2013  . Nodule of left lung 12/26/2013  . COPD, severity to be determined (HCC) 12/26/2013  . Pulmonary nodule, right 12/06/2013  . Hemoptysis 12/03/2013  . COPD (chronic obstructive pulmonary disease) (HCC) 12/03/2013  . Acute bronchitis 12/03/2013  . Tobacco use disorder 12/03/2013   Past Medical History:  Diagnosis Date  . Asthma   . CAP (community acquired pneumonia) 01/19/2016  . Cerebral aneurysm   . Chronic bronchitis (HCC)   . COPD (chronic obstructive pulmonary disease) (HCC)   . Emphysema lung (HCC)   . Emphysema of lung (HCC)   . GERD (gastroesophageal reflux disease)   . Hepatitis A infection ~ 1980  . High cholesterol   . Hypertension   . Pneumonia 1960s  . Rheumatoid arthritis (HCC)    "hands mostly"  ( 01/19/2016)  . Situational depression 12-17-09   "only when my mother died"  . Stroke [facility])    mini stroke when doing embolization, 12-18-2015, no deficits    Family History  Problem Relation Age of Onset  . Cancer Mother   . Arthritis Mother   . Stroke Father   . Hypertension Father   . Healthy Daughter   . Healthy Son   .  Colon cancer Neg Hx     Past Surgical History:  Procedure Laterality Date  . APPENDECTOMY  1980's  . CHEST TUBE INSERTION Left 1990's   "lung collapsed"  . CYSTECTOMY Left 1960's   "wrist"  . FOOT FRACTURE SURGERY Left ~ 2005   "it was crushed"  . IR GENERIC HISTORICAL  03/27/2016   IR ANGIOGRAM FOLLOW UP STUDY 03/27/2016 Julieanne Cotton, MD [facility]  . IR GENERIC HISTORICAL  03/27/2016   IR ANGIOGRAM FOLLOW UP STUDY 03/27/2016 Julieanne Cotton, MD [facility]  . IR GENERIC HISTORICAL  03/27/2016   IR ANGIO INTRA EXTRACRAN SEL COM CAROTID INNOMINATE UNI L MOD SED 03/27/2016 Julieanne Cotton, MD [facility]  . IR GENERIC HISTORICAL  03/27/2016   IR ANGIOGRAM FOLLOW UP STUDY 03/27/2016 Julieanne Cotton, MD  [facility]  . IR GENERIC HISTORICAL  03/27/2016   IR ANGIO VERTEBRAL SEL SUBCLAVIAN INNOMINATE UNI R MOD SED 03/27/2016 Julieanne Cotton, MD [facility]  . IR GENERIC HISTORICAL  03/27/2016   IR 3D INDEPENDENT WKST 03/27/2016 Julieanne Cotton, MD [facility]  . IR GENERIC HISTORICAL  03/27/2016   IR ANGIOGRAM FOLLOW UP STUDY 03/27/2016 Julieanne Cotton, MD [facility]  . IR GENERIC HISTORICAL  03/27/2016   IR NEURO EACH ADD'L AFTER BASIC UNI RIGHT (MS) 03/27/2016 Julieanne Cotton, MD [facility]  . IR GENERIC HISTORICAL  03/27/2016   IR ANGIOGRAM FOLLOW UP STUDY 03/27/2016 Julieanne Cotton, MD [facility]  . IR GENERIC HISTORICAL  03/27/2016   IR TRANSCATH/EMBOLIZ 03/27/2016 Julieanne Cotton, MD [facility]  . IR GENERIC HISTORICAL  03/27/2016   IR ANGIO INTRA EXTRACRAN SEL INTERNAL CAROTID UNI R MOD SED 03/27/2016 Julieanne Cotton, MD [facility]  . IR GENERIC HISTORICAL  03/27/2016   IR ANGIOGRAM FOLLOW UP STUDY 03/27/2016 Julieanne Cotton, MD [facility]  . IR GENERIC HISTORICAL  04/12/2016   IR RADIOLOGIST EVAL & MGMT 04/12/2016 [facility]  . RADIOLOGY WITH ANESTHESIA N/A 02/29/2016   Procedure: EMBOLIZATION     (RADIOLOGY WITH ANESTHESIA);  Surgeon: Julieanne Cotton, MD;  Location: [facility] OR;  Service: Radiology;  Laterality: N/A;  . RADIOLOGY WITH ANESTHESIA N/A 03/27/2016   Procedure: Embolization;  Surgeon: Julieanne Cotton, MD;  Location: [facility] OR;  Service: Radiology;  Laterality: N/A;  . TOTAL HIP ARTHROPLASTY Right 09/23/2017  . TOTAL HIP ARTHROPLASTY Right 09/23/2017   Procedure: RIGHT TOTAL HIP ARTHROPLASTY ANTERIOR APPROACH;  Surgeon: Cammy Copa, MD;  Location: [facility] OR;  Service: Orthopedics;  Laterality: Right;  . TOTAL HIP ARTHROPLASTY Left 11/26/2017   Procedure: LEFT TOTAL HIP ARTHROPLASTY ANTERIOR APPROACH;  Surgeon: Cammy Copa, MD;  Location: [facility] OR;  Service: Orthopedics;  Laterality: Left;  Marland Kitchen VIDEO BRONCHOSCOPY Bilateral 12/06/2013   Procedure: VIDEO  BRONCHOSCOPY WITHOUT FLUORO;  Surgeon: Alyson Reedy, MD;  Location: [facility] ENDOSCOPY;  Service: Cardiopulmonary;  Laterality: Bilateral;   Social History   Occupational History  . Not on file  Tobacco Use  . Smoking status: Light Tobacco Smoker    Packs/day: 0.50    Years: 40.00    Pack years: 20.00    Types: Cigarettes  . Smokeless tobacco: Never Used  . Tobacco comment: 11/13/2017: 4 cigarettes a day  Substance and Sexual Activity  . Alcohol use: Yes    Alcohol/week: 1.2 - 1.8 oz    Types: 2 - 3 Shots of liquor per week    Comment: 3 drinks liquor per day  . Drug use: Yes    Types: Marijuana    Comment:  1-2 times per week  . Sexual activity: Yes

## 2017-12-08 DIAGNOSIS — M069 Rheumatoid arthritis, unspecified: Secondary | ICD-10-CM | POA: Diagnosis not present

## 2017-12-08 DIAGNOSIS — Z96653 Presence of artificial knee joint, bilateral: Secondary | ICD-10-CM | POA: Diagnosis not present

## 2017-12-08 DIAGNOSIS — Z8701 Personal history of pneumonia (recurrent): Secondary | ICD-10-CM | POA: Diagnosis not present

## 2017-12-08 DIAGNOSIS — J449 Chronic obstructive pulmonary disease, unspecified: Secondary | ICD-10-CM | POA: Diagnosis not present

## 2017-12-08 DIAGNOSIS — Z471 Aftercare following joint replacement surgery: Secondary | ICD-10-CM | POA: Diagnosis not present

## 2017-12-08 DIAGNOSIS — I10 Essential (primary) hypertension: Secondary | ICD-10-CM | POA: Diagnosis not present

## 2017-12-08 DIAGNOSIS — F17210 Nicotine dependence, cigarettes, uncomplicated: Secondary | ICD-10-CM | POA: Diagnosis not present

## 2017-12-08 DIAGNOSIS — K219 Gastro-esophageal reflux disease without esophagitis: Secondary | ICD-10-CM | POA: Diagnosis not present

## 2017-12-08 DIAGNOSIS — R911 Solitary pulmonary nodule: Secondary | ICD-10-CM | POA: Diagnosis not present

## 2017-12-10 DIAGNOSIS — Z471 Aftercare following joint replacement surgery: Secondary | ICD-10-CM | POA: Diagnosis not present

## 2017-12-10 DIAGNOSIS — M069 Rheumatoid arthritis, unspecified: Secondary | ICD-10-CM | POA: Diagnosis not present

## 2017-12-10 DIAGNOSIS — Z8701 Personal history of pneumonia (recurrent): Secondary | ICD-10-CM | POA: Diagnosis not present

## 2017-12-10 DIAGNOSIS — Z96653 Presence of artificial knee joint, bilateral: Secondary | ICD-10-CM | POA: Diagnosis not present

## 2017-12-10 DIAGNOSIS — J449 Chronic obstructive pulmonary disease, unspecified: Secondary | ICD-10-CM | POA: Diagnosis not present

## 2017-12-10 DIAGNOSIS — I10 Essential (primary) hypertension: Secondary | ICD-10-CM | POA: Diagnosis not present

## 2017-12-10 DIAGNOSIS — K219 Gastro-esophageal reflux disease without esophagitis: Secondary | ICD-10-CM | POA: Diagnosis not present

## 2017-12-10 DIAGNOSIS — R911 Solitary pulmonary nodule: Secondary | ICD-10-CM | POA: Diagnosis not present

## 2017-12-10 DIAGNOSIS — F17210 Nicotine dependence, cigarettes, uncomplicated: Secondary | ICD-10-CM | POA: Diagnosis not present

## 2017-12-12 DIAGNOSIS — Z8701 Personal history of pneumonia (recurrent): Secondary | ICD-10-CM | POA: Diagnosis not present

## 2017-12-12 DIAGNOSIS — R911 Solitary pulmonary nodule: Secondary | ICD-10-CM | POA: Diagnosis not present

## 2017-12-12 DIAGNOSIS — Z471 Aftercare following joint replacement surgery: Secondary | ICD-10-CM | POA: Diagnosis not present

## 2017-12-12 DIAGNOSIS — Z96653 Presence of artificial knee joint, bilateral: Secondary | ICD-10-CM | POA: Diagnosis not present

## 2017-12-12 DIAGNOSIS — K219 Gastro-esophageal reflux disease without esophagitis: Secondary | ICD-10-CM | POA: Diagnosis not present

## 2017-12-12 DIAGNOSIS — J449 Chronic obstructive pulmonary disease, unspecified: Secondary | ICD-10-CM | POA: Diagnosis not present

## 2017-12-12 DIAGNOSIS — M069 Rheumatoid arthritis, unspecified: Secondary | ICD-10-CM | POA: Diagnosis not present

## 2017-12-12 DIAGNOSIS — I10 Essential (primary) hypertension: Secondary | ICD-10-CM | POA: Diagnosis not present

## 2017-12-12 DIAGNOSIS — F17210 Nicotine dependence, cigarettes, uncomplicated: Secondary | ICD-10-CM | POA: Diagnosis not present

## 2017-12-15 MED FILL — AMLODIPINE BESYLATE 10 MG T: 10 | 90 days supply | Qty: 90 | Fill #3

## 2017-12-15 MED FILL — DALIRESP 500 MCG TABLET: 500 | 30 days supply | Qty: 30 | Fill #2

## 2017-12-16 DIAGNOSIS — Z96653 Presence of artificial knee joint, bilateral: Secondary | ICD-10-CM | POA: Diagnosis not present

## 2017-12-16 DIAGNOSIS — Z471 Aftercare following joint replacement surgery: Secondary | ICD-10-CM | POA: Diagnosis not present

## 2017-12-16 DIAGNOSIS — I10 Essential (primary) hypertension: Secondary | ICD-10-CM | POA: Diagnosis not present

## 2017-12-16 DIAGNOSIS — R911 Solitary pulmonary nodule: Secondary | ICD-10-CM | POA: Diagnosis not present

## 2017-12-16 DIAGNOSIS — M069 Rheumatoid arthritis, unspecified: Secondary | ICD-10-CM | POA: Diagnosis not present

## 2017-12-16 DIAGNOSIS — Z8701 Personal history of pneumonia (recurrent): Secondary | ICD-10-CM | POA: Diagnosis not present

## 2017-12-16 DIAGNOSIS — J449 Chronic obstructive pulmonary disease, unspecified: Secondary | ICD-10-CM | POA: Diagnosis not present

## 2017-12-16 DIAGNOSIS — F17210 Nicotine dependence, cigarettes, uncomplicated: Secondary | ICD-10-CM | POA: Diagnosis not present

## 2017-12-16 DIAGNOSIS — K219 Gastro-esophageal reflux disease without esophagitis: Secondary | ICD-10-CM | POA: Diagnosis not present

## 2017-12-18 ENCOUNTER — Ambulatory Visit (INDEPENDENT_AMBULATORY_CARE_PROVIDER_SITE_OTHER)
Admission: RE | Admit: 2017-12-18 | Discharge: 2017-12-18 | Disposition: A | Discharge status: Home / Self Care | Payer: 59 | Source: Ambulatory Visit | Attending: Internal Medicine | Admitting: Internal Medicine

## 2017-12-18 ENCOUNTER — Telehealth: Payer: Self-pay

## 2017-12-18 ENCOUNTER — Ambulatory Visit: Payer: 59 | Admitting: Internal Medicine

## 2017-12-18 ENCOUNTER — Encounter: Payer: Self-pay | Admitting: Internal Medicine

## 2017-12-18 VITALS — BP 142/90 | HR 87 | Ht 72.0 in | Wt 180.4 lb

## 2017-12-18 DIAGNOSIS — R911 Solitary pulmonary nodule: Secondary | ICD-10-CM | POA: Diagnosis not present

## 2017-12-18 DIAGNOSIS — J439 Emphysema, unspecified: Secondary | ICD-10-CM | POA: Diagnosis not present

## 2017-12-18 DIAGNOSIS — F17210 Nicotine dependence, cigarettes, uncomplicated: Secondary | ICD-10-CM | POA: Diagnosis not present

## 2017-12-18 DIAGNOSIS — J449 Chronic obstructive pulmonary disease, unspecified: Secondary | ICD-10-CM

## 2017-12-18 MED ORDER — BUPROPION HCL ER (SR) 150 MG PO TB12: ORAL_TABLET | ORAL | 0 refills | Status: DC

## 2017-12-18 MED FILL — BUPROPION SR 150 MG TABLET: 150 | 17 days supply | Qty: 60 | Fill #0

## 2017-12-18 NOTE — Progress Notes (Signed)
Subjective:     Patient ID: Derek Conner, male   DOB: Nov 06, 1960, 57 y.o.   MRN: [id number]  HPI  57 yo Native American/ smoker GOLD III copd as of 10/25/16 pfts on a typical day p taking spiriva/daliresp/advair 500     OV 07/24/2016  Chief Complaint  Patient presents with  . Follow-up    Pt states overall his breathing is doing well. Pt states he has an occassional prod cough with clear to white mucus. Pt denies CP/tightness and f/c/s.     [name] with smoking, pulmonary nodules in September 2016, frequent COPD exacerbation and severe COPD based on 2000 1550  Admitted 05/16/2016 for COPD exacerbation. After that he followed up with my colleague and was doing well. Since then he's continued to do well. His COPD stable. He presents with his wife/girlfriend who is a Engineer, civil (consulting) and 60s at [employer]. He continues to refuse vaccines. He is taking triple inhaler therapy and also Roflumilast. He has lost 25 pounds due to dietary changes and it is intentional. He feels overall well. He continues to smoke a little bit and he knows the importance of quitting. Last pulmonary nodule was summer 2016 on CT chest. Chest x-rays this you have been clear.  11/15/2016 acute extended ov/Wert re: aecopd / active smoker Chief Complaint  Patient presents with  . Acute Visit    Pt c/o sinus pressure, HA, wheezing, increased SOB and cough for the past 4 days. Cough is prod with moderate amounts of green to yellow sputum. He is using his albuterol inhaler 3-4 x per day.   onset 11/12/16 flared on maint rx =  spiriva/daliresp/advair 500  plus on alb early p afternoon typically needs at least once daily    recently completed taper off pred / using saba now up to 4 x daily and noct but typically comfortable at rest p saba rx  Says can't take dulera or symbicort  Cough is worse at hs and in am and thinks this triggered the sob/ wheeze with lost of nasal congestion/obst symptoms  No obvious patterns in  day to day or daytime variability or assoc mucus plugs or hemoptysis or cp or chest tightness, subjective wheeze or overt   hb symptoms. No unusual exp hx or h/o childhood pna/ asthma or knowledge of premature birth.   Also denies any obvious fluctuation of symptoms with weather or environmental changes or other aggravating or alleviating factors except as outlined above     01/09/2017 2 week Follow Up Appointment Pt. Presents for follow up. He was seen 12/13/2015 for COPD exacerbation. He was treated with Doxycycline and a prednisone taper.He states his cough is better. He does still have secretions that are white to foamy.He no longer has purulent secretions. He is continuing to smoke. He states he is still short of breath.We discussed the shortness of breath and the correlation with smoking. We discussed the use of NicoDerm patches to assist with his attempt at smoking cessation. He states he has used these in the past. He cannot tolerate the 21 mg patch, so wants to use the 14 mg patches. I explained that I will prescribe Nicotrol inhaler for breakthrough if needed. We spent a long time discussing the need to quit smoking, as it is now affecting his ability to breathe. Patient denies fever, chest pain, orthopnea, or hemoptysis. He does complain of continued dyspnea on exertion. He is compliant with his Spiriva, Advair, and Daliresp. Last low-dose lung cancer screening CT  was read as a Lung RADS 4 A : suspicious findings, either short term follow up in 3 months or alternatively .3 month Follow up CT is due to July 2018.    Test Results:  Low Dose Lung Cancer Screening Follow Up CT:11/22/2016 No pleural effusion. Moderate changes of centrilobular emphysema noted. Diffuse bronchial wall thickening identified. Chronic scarring and architectural distortion within the posterior right upper lobe is again identified and appears similar to previous exam. Multiple pulmonary nodular densities are again  identified in both lungs. Within the left upper lobe there is an enlarging nodule which has an equivalent diameter of 7.8 mm,. On the previous exam this nodule was present with an equivalent diameter of 2.2 mm.   OV 02/10/2017  Chief Complaint  Patient presents with  . Follow-up    Pt c/o increase in SOB, prod cough with white mucus, chest tightness x 3 days. Pt denies f/c/s.    Follow-up  #Advanced COPD: He is here with his wife. I did do an FMLA form for his wife in the last few days. He tells me that he is in the midst of an exacerbation. Overall in 2018 he has not had any admissions. He and his wife believe that the strategy of coming to the office or calling during an exacerbation has prevented admissions. 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 He thinks  Working hard and exposure to carpets makes his copd flare up. He is allergic to breo and dulera and does not want to change his spiriva and advair as as a result. He ison daliresp and is working well. He DOES NOT think he is ready for disability. Says he loves work  #lung nodule - left side has growin in June 2018. Has fu CT February 21, 2017  #Smoking - stil does  OV 05/23/2017  Chief Complaint  Patient presents with  . Follow-up    Pt states that he has been doing good. Does still have some chest congestion and occ. cough with yellow mucus. Denies any SOB or CP.    Follow-up  Advanced COPD:Last saw him 05/08/2017 at the time of research nodule study. At that time he had COPD exacerbation. We'll give him antibodies and prednisone. Currently he is back to baseline. 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 He takes Advair and Spiriva and this works well for him. He is also on Daliresp. He still smokes and will not quit. He does not want a flu shot. In terms of his  Lung nodule: He is a repeat CT scan of the chest6 months from July 2018   OV 08/25/2017  Chief Complaint   Patient presents with  . Follow-up    feels like cough and SOB are better,has some chest congestion,surgical clearance for r hip 09/23/17 then replace the L hip later on   Follow-up advanced stage 3 COPD: He feels he is in the beginning stages of a COPD exacerbation.  For the last few days his chest is congested and is wheezing a little bit more.  He is interested in antibiotics and prednisone.  Otherwise he is compliant with Spiriva, Advair, Daliresp to prevent exacerbations.  Recently saw my nurse practitioner Sarah and was started on nocturnal oxygen.  He wants his listed on his medication list.  COPD CAT score is 21  Follow-up smoking: He continues to smoke but is trying to quit  New issue of preoperative lung clearance.  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  He is going to have right hip surgery first.  This can be done by Dr. August Saucer.  The surgery will take place at [facility] and not an outpatient surgical center.  He tells me that over 8 years ago he had rheumatoid arthritis and was on Humira.  Since then the rheumatoid arthritis has not bothered him.  He is off Humira because of side effects.  We did a preoperative clearance assessment as documented below.  Currently is using crutches.  He is on FMLA/disability.  He is in severe pain.    OV 12/18/2017  Chief Complaint  Patient presents with  . Follow-up    Pt had hip surgery 4/24.  Pt states he has been doing well since last visit and denies any complaints or concerns.   Follow-up several issues  Severe COPD: He is on triple inhaler therapy with Spiriva and Advair.  He does not like Breo.  He is stable.  COPD CAT score is 22.  He tells me that after right hip surgery in January 2019 and left hip surgery few to several weeks ago he is significantly better even in terms of respiratory health.  He is doing hip rehab with physical therapy.  After that he will be open to attending more pulmonary rehab.  Multiple  lung nodules: In January 2019 he had low-dose CT scan for lung cancer screening.  Some of these nodules were large.  It has a waxing and waning quality.  Radiologist did not recommend annual low-dose CT scan for lung cancer screening because of high false positivity.  It was felt the nodules were not malignant in orientation.  At this point in time is not having any fever or weight loss of sweats or chills or hemoptysis.  : He is cut down his cigarettes to 5 cigarettes a day.  He wants to quit.  He says Wellbutrin is helped him somewhat.  Now that his smoking is less he wants to start Wellbutrin again.  CAT COPD Symptom & Quality of Life Score (GSK trademark) 0 is no burden. 5 is highest burden 1/21/192 12/18/2017   Never Cough -> Cough all the time  2  No phlegm in chest -> Chest is full of phlegm  2  No chest tightness -> Chest feels very tight  1  No dyspnea for 1 flight stairs/hill -> Very dyspneic for 1 flight of stairs  5  No limitations for ADL at home -> Very limited with ADL at home  3  Confident leaving home -> Not at all confident leaving home  1  Sleep soundly -> Do not sleep soundly because of lung condition  5  Lots of Energy -> No energy at all  3  TOTAL Score (max 40)  21 22       has a past medical history of Asthma, CAP (community acquired pneumonia) (01/19/2016), Cerebral aneurysm, Chronic bronchitis (HCC), COPD (chronic obstructive pulmonary disease) (HCC), Emphysema lung (HCC), Emphysema of lung (HCC), GERD (gastroesophageal reflux disease), Hepatitis A infection (~ 1980), High cholesterol, Hypertension, Pneumonia (1960s), Rheumatoid arthritis (HCC), Situational depression (2011), and Stroke (HCC).   reports that he has been smoking cigarettes.  He has a 20.00 pack-year smoking history. He has never used smokeless tobacco.  Past Surgical History:  Procedure Laterality Date  . APPENDECTOMY  1980's  . CHEST TUBE INSERTION Left 1990's   "lung collapsed"  . CYSTECTOMY Left  1960's   "  wrist"  . FOOT FRACTURE SURGERY Left ~ 2005   "it was crushed"  . IR GENERIC HISTORICAL  03/27/2016   IR ANGIOGRAM FOLLOW UP STUDY 03/27/2016 Julieanne Cotton, MD [facility]  . IR GENERIC HISTORICAL  03/27/2016   IR ANGIOGRAM FOLLOW UP STUDY 03/27/2016 Julieanne Cotton, MD [facility]  . IR GENERIC HISTORICAL  03/27/2016   IR ANGIO INTRA EXTRACRAN SEL COM CAROTID INNOMINATE UNI L MOD SED 03/27/2016 Julieanne Cotton, MD [facility]  . IR GENERIC HISTORICAL  03/27/2016   IR ANGIOGRAM FOLLOW UP STUDY 03/27/2016 Julieanne Cotton, MD [facility]  . IR GENERIC HISTORICAL  03/27/2016   IR ANGIO VERTEBRAL SEL SUBCLAVIAN INNOMINATE UNI R MOD SED 03/27/2016 Julieanne Cotton, MD [facility]  . IR GENERIC HISTORICAL  03/27/2016   IR 3D INDEPENDENT WKST 03/27/2016 Julieanne Cotton, MD [facility]  . IR GENERIC HISTORICAL  03/27/2016   IR ANGIOGRAM FOLLOW UP STUDY 03/27/2016 Julieanne Cotton, MD [facility]  . IR GENERIC HISTORICAL  03/27/2016   IR NEURO EACH ADD'L AFTER BASIC UNI RIGHT (MS) 03/27/2016 Julieanne Cotton, MD [facility]  . IR GENERIC HISTORICAL  03/27/2016   IR ANGIOGRAM FOLLOW UP STUDY 03/27/2016 Julieanne Cotton, MD [facility]  . IR GENERIC HISTORICAL  03/27/2016   IR TRANSCATH/EMBOLIZ 03/27/2016 Julieanne Cotton, MD [facility]  . IR GENERIC HISTORICAL  03/27/2016   IR ANGIO INTRA EXTRACRAN SEL INTERNAL CAROTID UNI R MOD SED 03/27/2016 Julieanne Cotton, MD [facility]  . IR GENERIC HISTORICAL  03/27/2016   IR ANGIOGRAM FOLLOW UP STUDY 03/27/2016 Julieanne Cotton, MD [facility]  . IR GENERIC HISTORICAL  04/12/2016   IR RADIOLOGIST EVAL & MGMT 04/12/2016 [facility]  . RADIOLOGY WITH ANESTHESIA N/A 02/29/2016   Procedure: EMBOLIZATION     (RADIOLOGY WITH ANESTHESIA);  Surgeon: Julieanne Cotton, MD;  Location: [facility] OR;  Service: Radiology;  Laterality: N/A;  . RADIOLOGY WITH ANESTHESIA N/A 03/27/2016   Procedure: Embolization;  Surgeon: Julieanne Cotton, MD;   Location: [facility] OR;  Service: Radiology;  Laterality: N/A;  . TOTAL HIP ARTHROPLASTY Right 09/23/2017  . TOTAL HIP ARTHROPLASTY Right 09/23/2017   Procedure: RIGHT TOTAL HIP ARTHROPLASTY ANTERIOR APPROACH;  Surgeon: Cammy Copa, MD;  Location: [facility] OR;  Service: Orthopedics;  Laterality: Right;  . TOTAL HIP ARTHROPLASTY Left 11/26/2017   Procedure: LEFT TOTAL HIP ARTHROPLASTY ANTERIOR APPROACH;  Surgeon: Cammy Copa, MD;  Location: [facility] OR;  Service: Orthopedics;  Laterality: Left;  Marland Kitchen VIDEO BRONCHOSCOPY Bilateral 12/06/2013   Procedure: VIDEO BRONCHOSCOPY WITHOUT FLUORO;  Surgeon: Alyson Reedy, MD;  Location[facility] ENDOSCOPY;  Service: Cardiopulmonary;  Laterality: Bilateral;    Allergies  Allergen Reactions  . Symbicort [Budesonide-Formoterol Fumarate] Shortness Of Breath, Nausea And Vomiting and Other (See Comments)    heachache  . Dulera [Mometasone Furo-Formoterol Fum] Other (See Comments)    Headache   . Humira [Adalimumab] Nausea And Vomiting    Headaches     Immunization History  Administered Date(s) Administered  . Tdap 08/29/2015    Family History  Problem Relation Age of Onset  . Cancer Mother   . Arthritis Mother   . Stroke Father   . Hypertension Father   . Healthy Daughter   . Healthy Son   . Colon cancer Neg Hx      Current Outpatient Medications:  .  acetaminophen (TYLENOL) 500 MG tablet, Take 1,000 mg by mouth daily as needed for moderate pain or headache., Disp: , Rfl:  .  ADVAIR DISKUS 500-50 MCG/DOSE  AEPB, INHALE 1 PUFF BY MOUTH INTO THE LUNGS 2 (TWO) TIMES DAILY., Disp: 60 each, Rfl: 3 .  albuterol (PROVENTIL HFA;VENTOLIN HFA) 108 (90 Base) MCG/ACT inhaler, Inhale 2 puffs into the lungs every 4 (four) hours as needed for wheezing or shortness of breath., Disp: , Rfl:  .  albuterol (PROVENTIL) (2.5 MG/3ML) 0.083% nebulizer solution, Take 3 mLs (2.5 mg total) by nebulization every 6 (six) hours as needed for wheezing or shortness of breath. DX: J44.9,  Disp: 75 mL, Rfl: 12 .  amLODipine (NORVASC) 10 MG tablet, TAKE 1 TABLET (10 MG) BY MOUTH DAILY. (Patient taking differently: TAKE 1 TABLET (10 MG) BY MOUTH DAILY AT NIGHT), Disp: 90 tablet, Rfl: 3 .  b complex vitamins tablet, Take 1 tablet by mouth daily., Disp: , Rfl:  .  cloNIDine (CATAPRES) 0.2 MG tablet, Take 1 tablet (0.2 mg total) by mouth 2 (two) times daily., Disp: 60 tablet, Rfl: 11 .  DALIRESP 500 MCG TABS tablet, TAKE 1 TABLET (500 MCG TOTAL) BY MOUTH DAILY., Disp: 30 tablet, Rfl: 6 .  famotidine (PEPCID) 20 MG tablet, TAKE 1 TABLET (20 MG TOTAL) BY MOUTH AT BEDTIME., Disp: 90 tablet, Rfl: 1 .  fenofibrate 54 MG tablet, Take 1 tablet (54 mg total) by mouth daily., Disp: 30 tablet, Rfl: 0 .  OXYGEN, Inhale 2 L into the lungs at bedtime., Disp: , Rfl:  .  pantoprazole (PROTONIX) 40 MG tablet, TAKE 1 TABLET (40 MG TOTAL) BY MOUTH DAILY AT 12:00 NOON., Disp: 30 tablet, Rfl: 3 .  tiotropium (SPIRIVA HANDIHALER) 18 MCG inhalation capsule, PLACE 1 CAPSULE INTO INHALER AND INHALE INTO LUNGS DAILY., Disp: 90 capsule, Rfl: 3 No current facility-administered medications for this visit.   Facility-Administered Medications Ordered in Other Visits:  .  0.9 %  sodium chloride infusion, , Intravenous, Continuous, Ralene Muskrat, PA-C .  aspirin EC tablet 325 mg, 325 mg, Oral, 60 min Pre-Op, Turpin, Pamela, PA-C .  niMODipine (NIMOTOP) capsule 0-60 mg, 0-60 mg, Oral, 60 min Pre-Op, Ralene Muskrat, PA-C    Review of Systems     Objective:   Physical Exam  Constitutional: He is oriented to person, place, and time. He appears well-developed and well-nourished. No distress.  Lost some weight after b/l hip surgery  HENT:  Head: Normocephalic and atraumatic.  Right Ear: External ear normal.  Left Ear: External ear normal.  Mouth/Throat: Oropharynx is clear and moist. No oropharyngeal exudate.  Eyes: Pupils are equal, round, and reactive to light. Conjunctivae and EOM are normal. Right eye  exhibits no discharge. Left eye exhibits no discharge. No scleral icterus.  Neck: Normal range of motion. Neck supple. No JVD present. No tracheal deviation present. No thyromegaly present.  Cardiovascular: Normal rate, regular rhythm and intact distal pulses. Exam reveals no gallop and no friction rub.  No murmur heard. Pulmonary/Chest: Effort normal and breath sounds normal. No respiratory distress. He has no wheezes. He has no rales. He exhibits no tenderness.  Abdominal: Soft. Bowel sounds are normal. He exhibits no distension and no mass. There is no tenderness. There is no rebound and no guarding.  Musculoskeletal: Normal range of motion. He exhibits no edema or tenderness.  Has crutches  Lymphadenopathy:    He has no cervical adenopathy.  Neurological: He is alert and oriented to person, place, and time. He has normal reflexes. No cranial nerve deficit. Coordination normal.  Skin: Skin is warm and dry. No rash noted. He is not diaphoretic. No erythema. No pallor.  Psychiatric: He has a normal mood and affect. His behavior is normal. Judgment and thought content normal.  Nursing note and vitals reviewed.  Vitals:   12/18/17 0918  BP: (!) 142/90  Pulse: 87  SpO2: 99%  Weight: 180 lb 6.4 oz (81.8 kg)  Height: 6' (1.829 m)    Facility age limit for growth percentiles is 20 years. Estimated body mass index is 24.47 kg/m as calculated from the following:   Height as of this encounter: 6' (1.829 m).   Weight as of this encounter: 180 lb 6.4 oz (81.8 kg).     Assessment:       ICD-10-CM   1. Cigarette smoker F17.210   2. COPD, severe (HCC) J44.9   3. Nodule of right lung R91.1        Plan:     Cigarette smoker  - glad you cut down and glad you want to try wellbutrin - STart wellbutrin:  Initial: 150 mg once daily for 3 days; increase to 150 mg twice daily treatment which you should continue for 12 weeks. Also, one week to two weeks after your start wellbutrin quit  smoking  COPD, severe (HCC)  - stable and glad hips are recovering - disability application per your desire - continue spiriva and advair scheduled  - albuterol as needed    Nodule of right lung - the radiologist is not recommending annual low dose CT due to the waxing and waning nodule - I thinlk the nodule still need followup  - do  cXR 12/18/2017 - Do CT chest without contrast in 3 months; might make it sooner if cxr is abnormal grossly  Followup - 3 months or sooner    Dr. Kalman Shan, M.D., [facility].C.P Pulmonary and Critical Care Medicine Staff Physician, [facility] Director - Interstitial Lung Disease  Program  Pulmonary Fibrosis [facility], [place], [address]  Pager: [phone], If no answer or between  15:00h - 7:00h: call [phone] Telephone: [phone]

## 2017-12-18 NOTE — Patient Instructions (Addendum)
Cigarette smoker  - glad you cut down and glad you want to try wellbutrin - STart wellbutrin:  Initial: 150 mg once daily for 3 days; increase to 150 mg twice daily treatment which you should continue for 12 weeks. Also, one week to two weeks after your start wellbutrin quit smoking  COPD, severe (HCC)  - stable and glad hips are recovering - disability application per your desire - continue spiriva and advair scheduled  - albuterol as needed    Nodule of right lung - the radiologist is not recommending annual low dose CT due to the waxing and waning nodule - I thinlk the nodule still need followup  - do  cXR 12/18/2017 - Do CT chest without contrast in 3 months; might make it sooner if cxr is abnormal grossly  Followup - 3 months or sooner

## 2017-12-19 ENCOUNTER — Other Ambulatory Visit: Payer: Self-pay | Admitting: *Deleted

## 2017-12-19 ENCOUNTER — Encounter (INDEPENDENT_AMBULATORY_CARE_PROVIDER_SITE_OTHER): Payer: Self-pay | Admitting: Orthopedic Surgery

## 2017-12-19 ENCOUNTER — Ambulatory Visit (INDEPENDENT_AMBULATORY_CARE_PROVIDER_SITE_OTHER): Payer: 59 | Admitting: Orthopedic Surgery

## 2017-12-19 DIAGNOSIS — R911 Solitary pulmonary nodule: Secondary | ICD-10-CM

## 2017-12-19 DIAGNOSIS — M069 Rheumatoid arthritis, unspecified: Secondary | ICD-10-CM | POA: Diagnosis not present

## 2017-12-19 DIAGNOSIS — I10 Essential (primary) hypertension: Secondary | ICD-10-CM | POA: Diagnosis not present

## 2017-12-19 DIAGNOSIS — F17210 Nicotine dependence, cigarettes, uncomplicated: Secondary | ICD-10-CM | POA: Diagnosis not present

## 2017-12-19 DIAGNOSIS — Z471 Aftercare following joint replacement surgery: Secondary | ICD-10-CM | POA: Diagnosis not present

## 2017-12-19 DIAGNOSIS — J449 Chronic obstructive pulmonary disease, unspecified: Secondary | ICD-10-CM | POA: Diagnosis not present

## 2017-12-19 DIAGNOSIS — K219 Gastro-esophageal reflux disease without esophagitis: Secondary | ICD-10-CM | POA: Diagnosis not present

## 2017-12-19 DIAGNOSIS — Z8701 Personal history of pneumonia (recurrent): Secondary | ICD-10-CM | POA: Diagnosis not present

## 2017-12-19 DIAGNOSIS — Z96653 Presence of artificial knee joint, bilateral: Secondary | ICD-10-CM | POA: Diagnosis not present

## 2017-12-19 DIAGNOSIS — Z96642 Presence of left artificial hip joint: Secondary | ICD-10-CM

## 2017-12-20 ENCOUNTER — Encounter (INDEPENDENT_AMBULATORY_CARE_PROVIDER_SITE_OTHER): Payer: Self-pay | Admitting: Orthopedic Surgery

## 2017-12-20 NOTE — Progress Notes (Signed)
Post-Op Visit Note   Patient: Derek Conner           Date of Birth: July 01, 1961           MRN: [id number] Visit Date: 12/19/2017 PCP: Esperanza Richters, PA-C   Assessment & Plan:  Chief Complaint:  Chief Complaint  Patient presents with  . Left Hip - Routine Post Op   Visit Diagnoses:  1. Status post total replacement of left hip     Plan: Derek Conner is a patient who is now short of a month out left total hip replacement.  He is doing well.  Taking ibuprofen for pain.  He is much improved over his prior clinic visit.  He is not having any fevers or chills.  He has more achiness and not pain.  He has home health physical therapy.  Does office work.  On examination he has equal leg lengths and improving hip flexion strength on the left.  Incision intact.  Plan is let him return to work 1 week from this coming Monday.  4-week return for final check.  Follow-Up Instructions: Return in about 1 month (around 01/16/2018).   Orders:  No orders of the defined types were placed in this encounter.  No orders of the defined types were placed in this encounter.   Imaging: No results found.  PMFS History: Patient Active Problem List   Diagnosis Date Noted  . 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 Nocturnal hypoxemia 04/03/2017  . Fatigue 01/09/2017  . Thrush 12/12/2016  . Brain aneurysm 03/27/2016  . Preop respiratory exam 03/26/2016  . Aneurysm (HCC)   . Chronic obstructive pulmonary disease with acute exacerbation (HCC) 01/19/2016  . Community acquired pneumonia 01/19/2016  . Cough syncope 01/19/2016  . Wellness examination 08/29/2015  . Hyperglycemia 01/06/2015  . HTN (hypertension) 12/09/2014  . COPD exacerbation (HCC) 07/08/2014  . Atypical chest pain 07/08/2014  . 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 Dry mouth 12/26/2013  . Cigarette smoker 12/26/2013  . Nodule of right lung 12/26/2013  . COPD, severity to be determined (HCC) 12/26/2013  . Pulmonary nodule, right 12/06/2013  .  Hemoptysis 12/03/2013  . COPD (chronic obstructive pulmonary disease) (HCC) 12/03/2013  . Acute bronchitis 12/03/2013  . Tobacco use disorder 12/03/2013   Past Medical History:  Diagnosis Date  . Asthma   . CAP (community acquired pneumonia) 01/19/2016  . Cerebral aneurysm   . Chronic bronchitis (HCC)   . COPD (chronic obstructive pulmonary disease) (HCC)   . Emphysema lung (HCC)   . Emphysema of lung (HCC)   . GERD (gastroesophageal reflux disease)   . Hepatitis A infection ~ 1980  . High cholesterol   . Hypertension   . Pneumonia 1960s  . Rheumatoid arthritis (HCC)    "hands mostly"  ( 01/19/2016)  . Situational depression 12-12-09   "only when my mother died"  . Stroke [facility])    mini stroke when doing embolization, Dec 13, 2015, no deficits    Family History  Problem Relation Age of Onset  . Cancer Mother   . Arthritis Mother   . Stroke Father   . Hypertension Father   . Healthy Daughter   . Healthy Son   . Colon cancer Neg Hx     Past Surgical History:  Procedure Laterality Date  . APPENDECTOMY  1980's  . CHEST TUBE INSERTION Left 1990's   "lung collapsed"  . CYSTECTOMY Left 1960's   "wrist"  . FOOT FRACTURE SURGERY Left ~ 12-13-03   "it was crushed"  .  IR GENERIC HISTORICAL  03/27/2016   IR ANGIOGRAM FOLLOW UP STUDY 03/27/2016 Julieanne Cotton, MD [facility]  . IR GENERIC HISTORICAL  03/27/2016   IR ANGIOGRAM FOLLOW UP STUDY 03/27/2016 Julieanne Cotton, MD [facility]  . IR GENERIC HISTORICAL  03/27/2016   IR ANGIO INTRA EXTRACRAN SEL COM CAROTID INNOMINATE UNI L MOD SED 03/27/2016 Julieanne Cotton, MD [facility]  . IR GENERIC HISTORICAL  03/27/2016   IR ANGIOGRAM FOLLOW UP STUDY 03/27/2016 Julieanne Cotton, MD [facility]  . IR GENERIC HISTORICAL  03/27/2016   IR ANGIO VERTEBRAL SEL SUBCLAVIAN INNOMINATE UNI R MOD SED 03/27/2016 Julieanne Cotton, MD [facility]  . IR GENERIC HISTORICAL  03/27/2016   IR 3D INDEPENDENT WKST 03/27/2016 Julieanne Cotton, MD [facility]    . IR GENERIC HISTORICAL  03/27/2016   IR ANGIOGRAM FOLLOW UP STUDY 03/27/2016 Julieanne Cotton, MD [facility]  . IR GENERIC HISTORICAL  03/27/2016   IR NEURO EACH ADD'L AFTER BASIC UNI RIGHT (MS) 03/27/2016 Julieanne Cotton, MD [facility]  . IR GENERIC HISTORICAL  03/27/2016   IR ANGIOGRAM FOLLOW UP STUDY 03/27/2016 Julieanne Cotton, MD [facility]  . IR GENERIC HISTORICAL  03/27/2016   IR TRANSCATH/EMBOLIZ 03/27/2016 Julieanne Cotton, MD [facility]  . IR GENERIC HISTORICAL  03/27/2016   IR ANGIO INTRA EXTRACRAN SEL INTERNAL CAROTID UNI R MOD SED 03/27/2016 Julieanne Cotton, MD [facility]  . IR GENERIC HISTORICAL  03/27/2016   IR ANGIOGRAM FOLLOW UP STUDY 03/27/2016 Julieanne Cotton, MD [facility]  . IR GENERIC HISTORICAL  04/12/2016   IR RADIOLOGIST EVAL & MGMT 04/12/2016 [facility]  . RADIOLOGY WITH ANESTHESIA N/A 02/29/2016   Procedure: EMBOLIZATION     (RADIOLOGY WITH ANESTHESIA);  Surgeon: Julieanne Cotton, MD;  Location: [facility] OR;  Service: Radiology;  Laterality: N/A;  . RADIOLOGY WITH ANESTHESIA N/A 03/27/2016   Procedure: Embolization;  Surgeon: Julieanne Cotton, MD;  Location: [facility] OR;  Service: Radiology;  Laterality: N/A;  . TOTAL HIP ARTHROPLASTY Right 09/23/2017  . TOTAL HIP ARTHROPLASTY Right 09/23/2017   Procedure: RIGHT TOTAL HIP ARTHROPLASTY ANTERIOR APPROACH;  Surgeon: Cammy Copa, MD;  Location[facility] OR;  Service: Orthopedics;  Laterality: Right;  . TOTAL HIP ARTHROPLASTY Left 11/26/2017   Procedure: LEFT TOTAL HIP ARTHROPLASTY ANTERIOR APPROACH;  Surgeon: Cammy Copa, MD;  Location: [facility] OR;  Service: Orthopedics;  Laterality: Left;  Marland Kitchen VIDEO BRONCHOSCOPY Bilateral 12/06/2013   Procedure: VIDEO BRONCHOSCOPY WITHOUT FLUORO;  Surgeon: Alyson Reedy, MD;  Location: [facility] ENDOSCOPY;  Service: Cardiopulmonary;  Laterality: Bilateral;   Social History   Occupational History  . Not on file  Tobacco Use  . Smoking status: Light Tobacco Smoker    Packs/day:  0.50    Years: 40.00    Pack years: 20.00    Types: Cigarettes  . Smokeless tobacco: Never Used  . Tobacco comment: 12/18/17: 5 cigs per day  Substance and Sexual Activity  . Alcohol use: Yes    Alcohol/week: 1.2 - 1.8 oz    Types: 2 - 3 Shots of liquor per week    Comment: 3 drinks liquor per day  . Drug use: Yes    Types: Marijuana    Comment: 1-2 times per week  . Sexual activity: Yes

## 2017-12-24 ENCOUNTER — Other Ambulatory Visit (INDEPENDENT_AMBULATORY_CARE_PROVIDER_SITE_OTHER): Payer: Self-pay | Admitting: Orthopedic Surgery

## 2017-12-24 MED FILL — SPIRIVA 18 MCG CP-HANDIHALE: 18 | 30 days supply | Qty: 30 | Fill #4

## 2017-12-24 MED FILL — FENOFIBRATE 54 MG TABLET: 54 | 30 days supply | Qty: 30 | Fill #0

## 2017-12-24 NOTE — Telephone Encounter (Signed)
y

## 2017-12-24 NOTE — Telephone Encounter (Signed)
Rx request 

## 2017-12-30 MED FILL — ADVAIR 500/50 DISKUS: 500-50 | 30 days supply | Qty: 60 | Fill #2

## 2018-01-05 DIAGNOSIS — J441 Chronic obstructive pulmonary disease with (acute) exacerbation: Secondary | ICD-10-CM | POA: Diagnosis not present

## 2018-01-05 DIAGNOSIS — J449 Chronic obstructive pulmonary disease, unspecified: Secondary | ICD-10-CM | POA: Diagnosis not present

## 2018-01-05 DIAGNOSIS — R0902 Hypoxemia: Secondary | ICD-10-CM | POA: Diagnosis not present

## 2018-01-07 ENCOUNTER — Other Ambulatory Visit: Payer: Self-pay | Admitting: Radiology

## 2018-01-08 ENCOUNTER — Encounter (HOSPITAL_COMMUNITY): Payer: Self-pay

## 2018-01-08 ENCOUNTER — Other Ambulatory Visit (HOSPITAL_COMMUNITY): Payer: Self-pay | Admitting: Interventional Radiology

## 2018-01-08 ENCOUNTER — Ambulatory Visit (HOSPITAL_COMMUNITY)
Admission: RE | Admit: 2018-01-08 | Discharge: 2018-01-08 | Disposition: A | Discharge status: Home / Self Care | Payer: 59 | Source: Ambulatory Visit | Attending: Interventional Radiology | Admitting: Interventional Radiology

## 2018-01-08 DIAGNOSIS — Z7951 Long term (current) use of inhaled steroids: Secondary | ICD-10-CM | POA: Insufficient documentation

## 2018-01-08 DIAGNOSIS — Z8673 Personal history of transient ischemic attack (TIA), and cerebral infarction without residual deficits: Secondary | ICD-10-CM | POA: Diagnosis not present

## 2018-01-08 DIAGNOSIS — R51 Headache: Secondary | ICD-10-CM | POA: Diagnosis not present

## 2018-01-08 DIAGNOSIS — K219 Gastro-esophageal reflux disease without esophagitis: Secondary | ICD-10-CM | POA: Diagnosis not present

## 2018-01-08 DIAGNOSIS — J449 Chronic obstructive pulmonary disease, unspecified: Secondary | ICD-10-CM | POA: Diagnosis not present

## 2018-01-08 DIAGNOSIS — Z8249 Family history of ischemic heart disease and other diseases of the circulatory system: Secondary | ICD-10-CM | POA: Diagnosis not present

## 2018-01-08 DIAGNOSIS — T85698A Other mechanical complication of other specified internal prosthetic devices, implants and grafts, initial encounter: Secondary | ICD-10-CM | POA: Diagnosis not present

## 2018-01-08 DIAGNOSIS — F17210 Nicotine dependence, cigarettes, uncomplicated: Secondary | ICD-10-CM | POA: Diagnosis not present

## 2018-01-08 DIAGNOSIS — M069 Rheumatoid arthritis, unspecified: Secondary | ICD-10-CM | POA: Insufficient documentation

## 2018-01-08 DIAGNOSIS — I729 Aneurysm of unspecified site: Secondary | ICD-10-CM

## 2018-01-08 DIAGNOSIS — E7800 Pure hypercholesterolemia, unspecified: Secondary | ICD-10-CM | POA: Insufficient documentation

## 2018-01-08 DIAGNOSIS — Z8774 Personal history of (corrected) congenital malformations of heart and circulatory system: Secondary | ICD-10-CM | POA: Diagnosis not present

## 2018-01-08 DIAGNOSIS — I10 Essential (primary) hypertension: Secondary | ICD-10-CM | POA: Insufficient documentation

## 2018-01-08 HISTORY — PX: IR ANGIO VERTEBRAL SEL VERTEBRAL BILAT MOD SED: IMG5369

## 2018-01-08 HISTORY — PX: IR ANGIO INTRA EXTRACRAN SEL COM CAROTID INNOMINATE BILAT MOD SED: IMG5360

## 2018-01-08 HISTORY — PX: IR 3D INDEPENDENT WKST: IMG2385

## 2018-01-08 LAB — BASIC METABOLIC PANEL
Anion gap: 10 (ref 5–15)
BUN: 15 mg/dL (ref 6–20)
CALCIUM: 10.1 mg/dL (ref 8.9–10.3)
CO2: 26 mmol/L (ref 22–32)
CREATININE: 1.29 mg/dL — AB (ref 0.61–1.24)
Chloride: 103 mmol/L (ref 101–111)
GFR calc non Af Amer: 60 mL/min — ABNORMAL LOW (ref >60)
Glucose, Bld: 107 mg/dL — ABNORMAL HIGH (ref 65–99)
Potassium: 4 mmol/L (ref 3.5–5.1)
SODIUM: 139 mmol/L (ref 135–145)

## 2018-01-08 LAB — CBC
HCT: 43.6 % (ref 39.0–52.0)
Hemoglobin: 13.4 g/dL (ref 13.0–17.0)
MCH: 26.4 pg (ref 26.0–34.0)
MCHC: 30.7 g/dL (ref 30.0–36.0)
MCV: 86 fL (ref 78.0–100.0)
PLATELETS: 455 10*3/uL — AB (ref 150–400)
RBC: 5.07 MIL/uL (ref 4.22–5.81)
RDW: 14.9 % (ref 11.5–15.5)
WBC: 9 10*3/uL (ref 4.0–10.5)

## 2018-01-08 LAB — PROTIME-INR
INR: 0.9
PROTHROMBIN TIME: 12 s (ref 11.4–15.2)

## 2018-01-08 MED ORDER — IOHEXOL 300 MG/ML  SOLN
150.0000 mL | Freq: Once | INTRAMUSCULAR | Status: AC | PRN
  Administered 2018-01-08: 85 mL via INTRA_ARTERIAL

## 2018-01-08 MED ORDER — SODIUM CHLORIDE 0.9 % IV SOLN
Freq: Once | INTRAVENOUS | Status: AC
  Administered 2018-01-08: 07:00:00 via INTRAVENOUS

## 2018-01-08 MED ORDER — HEPARIN SODIUM (PORCINE) 1000 UNIT/ML IJ SOLN
INTRAMUSCULAR | Status: AC
Start: 2018-01-08 — End: 2018-01-08
  Filled 2018-01-08: qty 1

## 2018-01-08 MED ORDER — HEPARIN SODIUM (PORCINE) 1000 UNIT/ML IJ SOLN
INTRAMUSCULAR | Status: AC
  Filled 2018-01-08: qty 1

## 2018-01-08 MED ORDER — SODIUM CHLORIDE 0.9 % IV SOLN: INTRAVENOUS | Status: AC

## 2018-01-08 MED ORDER — LIDOCAINE HCL (PF) 1 % IJ SOLN
INTRAMUSCULAR | Status: AC | PRN
  Administered 2018-01-08: 15 mL

## 2018-01-08 MED ORDER — FENTANYL CITRATE (PF) 100 MCG/2ML IJ SOLN
INTRAMUSCULAR | Status: AC
  Filled 2018-01-08: qty 2

## 2018-01-08 MED ORDER — FENTANYL CITRATE (PF) 100 MCG/2ML IJ SOLN
INTRAMUSCULAR | Status: AC | PRN
  Administered 2018-01-08 (×2): 25 ug via INTRAVENOUS

## 2018-01-08 MED ORDER — MIDAZOLAM HCL 2 MG/2ML IJ SOLN
INTRAMUSCULAR | Status: AC | PRN
  Administered 2018-01-08: 1 mg via INTRAVENOUS

## 2018-01-08 MED ORDER — HYDRALAZINE HCL 20 MG/ML IJ SOLN
INTRAMUSCULAR | Status: AC | PRN
  Administered 2018-01-08: 5 mg via INTRAVENOUS

## 2018-01-08 MED ORDER — MIDAZOLAM HCL 2 MG/2ML IJ SOLN
INTRAMUSCULAR | Status: AC
  Filled 2018-01-08: qty 2

## 2018-01-08 MED ORDER — LIDOCAINE HCL 1 % IJ SOLN
INTRAMUSCULAR | Status: AC
  Filled 2018-01-08: qty 20

## 2018-01-08 MED ORDER — IOPAMIDOL (ISOVUE-300) INJECTION 61%
INTRAVENOUS | Status: AC
  Filled 2018-01-08: qty 100

## 2018-01-08 MED ORDER — HYDRALAZINE HCL 20 MG/ML IJ SOLN
INTRAMUSCULAR | Status: AC
  Filled 2018-01-08: qty 1

## 2018-01-08 MED ORDER — SODIUM CHLORIDE 0.9 % IV SOLN
INTRAVENOUS | Status: AC | PRN
  Administered 2018-01-08: 250 mL via INTRAVENOUS

## 2018-01-08 MED ORDER — HEPARIN SODIUM (PORCINE) 1000 UNIT/ML IJ SOLN
INTRAMUSCULAR | Status: AC | PRN
  Administered 2018-01-08: 1000 [IU] via INTRAVENOUS

## 2018-01-08 NOTE — Sedation Documentation (Signed)
Sheath removed 67fr exoseal applied

## 2018-01-08 NOTE — Discharge Instructions (Addendum)
Cerebral Angiogram, Care After °Refer to this sheet in the next few weeks. These instructions provide you with information on caring for yourself after your procedure. Your health care provider may also give you more specific instructions. Your treatment has been planned according to current medical practices, but problems sometimes occur. Call your health care provider if you have any problems or questions after your procedure. °What can I expect after the procedure? °After your procedure, it is typical to have the following: °· Bruising at the catheter insertion site that usually fades within 1-2 weeks. °· Blood collecting in the tissue (hematoma) that may be painful to the touch. It should usually decrease in size and tenderness within 1-2 weeks. °· A mild headache. ° °Follow these instructions at home: °· Take medicines only as directed by your health care provider. °· You may shower 24-48 hours after the procedure or as directed by your health care provider. Remove the bandage (dressing) and gently wash the site with plain soap and water. Pat the area dry with a clean towel. Do not rub the site, because this may cause bleeding. °· Do not take baths, swim, or use a hot tub until your health care provider approves. °· Check your insertion site every day for redness, swelling, or drainage. °· Do not apply powder or lotion to the site. °· Do not lift over 10 lb (4.5 kg) for 5 days after your procedure or as directed by your health care provider. °· Ask your health care provider when it is okay to: °? Return to work or school. °? Resume usual physical activities or sports. °? Resume sexual activity. °· Do not drive home if you are discharged the same day as the procedure. Have someone else drive you. °· You may drive 24 hours after the procedure unless otherwise instructed by your health care provider. °· Do not operate machinery or power tools for 24 hours after the procedure or as directed by your health care  provider. °· If your procedure was done as an outpatient procedure, which means that you went home the same day as your procedure, a responsible adult should be with you for the first 24 hours after you arrive home. °· Keep all follow-up visits as directed by your health care provider. This is important. °Contact a health care provider if: °· You have a fever. °· You have chills. °· You have increased bleeding from the catheter insertion site. Hold pressure on the site. °Get help right away if: °· You have vision changes or loss of vision. °· You have numbness or weakness on one side of your body. °· You have difficulty talking, or you have slurred speech or cannot speak (aphasia). °· You feel confused or have difficulty remembering. °· You have unusual pain at the catheter insertion site. °· You have redness, warmth, or swelling at the catheter insertion site. °· You have drainage (other than a small amount of blood on the dressing) from the catheter insertion site. °· The catheter insertion site is bleeding, and the bleeding does not stop after 30 minutes of holding steady pressure on the site. °These symptoms may represent a serious problem that is an emergency. Do not wait to see if the symptoms will go away. Get medical help right away. Call your local emergency services (911 in [place] Do not drive yourself to the hospital. °This information is not intended to replace advice given to you by your health care provider. Make sure you discuss any questions   you have with your health care provider. °Document Released: 12/06/2013 Document Revised: 12/28/2015 Document Reviewed: 08/04/2013 °Elsevier Interactive Patient Education © 2017 [employer] °Moderate Conscious Sedation, Adult, Care After °These instructions provide you with information about caring for yourself after your procedure. Your health care provider may also give you more specific instructions. Your treatment has been planned according to current  medical practices, but problems sometimes occur. Call your health care provider if you have any problems or questions after your procedure. °What can I expect after the procedure? °After your procedure, it is common: °· To feel sleepy for several hours. °· To feel clumsy and have poor balance for several hours. °· To have poor judgment for several hours. °· To vomit if you eat too soon. ° °Follow these instructions at home: °For at least 24 hours after the procedure: ° °· Do not: °? Participate in activities where you could fall or become injured. °? Drive. °? Use heavy machinery. °? Drink alcohol. °? Take sleeping pills or medicines that cause drowsiness. °? Make important decisions or sign legal documents. °? Take care of children on your own. °· Rest. °Eating and drinking °· Follow the diet recommended by your health care provider. °· If you vomit: °? Drink water, juice, or soup when you can drink without vomiting. °? Make sure you have little or no nausea before eating solid foods. °General instructions °· Have a responsible adult stay with you until you are awake and alert. °· Take over-the-counter and prescription medicines only as told by your health care provider. °· If you smoke, do not smoke without supervision. °· Keep all follow-up visits as told by your health care provider. This is important. °Contact a health care provider if: °· You keep feeling nauseous or you keep vomiting. °· You feel light-headed. °· You develop a rash. °· You have a fever. °Get help right away if: °· You have trouble breathing. °This information is not intended to replace advice given to you by your health care provider. Make sure you discuss any questions you have with your health care provider. °Document Released: 05/12/2013 Document Revised: 12/25/2015 Document Reviewed: 11/11/2015 °Elsevier Interactive Patient Education © 2018 [employer] ° °

## 2018-01-08 NOTE — Procedures (Signed)
S/P 4 vessel cerebral arteriogram. RT CFA approach  Findings. 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

## 2018-01-08 NOTE — H&P (Signed)
Chief Complaint: Patient was seen in consultation today for cerebral arteriogram    Supervising Physician: Julieanne Cotton  Patient Status: [facility] - Out-pt  History of Present Illness: Derek Conner is a 57 y.o. 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 [place] of leg was neg for clot-- has been off Xarelto since early May 2019 Has also been off ASA since surgery  Denies headache; denies dizziness Denies change in speech or vision Using all 4s with good strength  ++ smoker  Scheduled now for cerebral arteriogram  Past Medical History:  Diagnosis Date  . Asthma   . CAP (community acquired pneumonia) 01/19/2016  . Cerebral aneurysm   . Chronic bronchitis (HCC)   . COPD (chronic obstructive pulmonary disease) (HCC)   . Emphysema lung (HCC)   . Emphysema of lung (HCC)   . GERD (gastroesophageal reflux disease)   . Hepatitis A infection ~ 1980  . High cholesterol   . Hypertension   . Pneumonia 1960s  . Rheumatoid arthritis (HCC)    "hands mostly"  ( 01/19/2016)  . Situational depression Dec 19, 2009   "only when my mother died"  . Stroke [facility])    mini stroke when doing embolization, 2015/12/20, no deficits    Past Surgical History:  Procedure Laterality Date  . APPENDECTOMY  1980's  . CHEST TUBE INSERTION Left 1990's   "lung collapsed"  . CYSTECTOMY Left 1960's   "wrist"  . FOOT FRACTURE SURGERY Left ~ 20-Dec-2003   "it was crushed"  . IR GENERIC HISTORICAL  03/27/2016   IR ANGIOGRAM FOLLOW UP STUDY 03/27/2016 Julieanne Cotton, MD [facility]  . IR GENERIC HISTORICAL  03/27/2016   IR ANGIOGRAM FOLLOW UP STUDY 03/27/2016 Julieanne Cotton, MD [facility]  . IR GENERIC HISTORICAL  03/27/2016   IR ANGIO INTRA EXTRACRAN SEL COM CAROTID INNOMINATE UNI L MOD SED 03/27/2016 Julieanne Cotton, MD [facility]  . IR GENERIC  HISTORICAL  03/27/2016   IR ANGIOGRAM FOLLOW UP STUDY 03/27/2016 Julieanne Cotton, MD [facility]  . IR GENERIC HISTORICAL  03/27/2016   IR ANGIO VERTEBRAL SEL SUBCLAVIAN INNOMINATE UNI R MOD SED 03/27/2016 Julieanne Cotton, MD [facility]  . IR GENERIC HISTORICAL  03/27/2016   IR 3D INDEPENDENT WKST 03/27/2016 Julieanne Cotton, MD [facility]  . IR GENERIC HISTORICAL  03/27/2016   IR ANGIOGRAM FOLLOW UP STUDY 03/27/2016 Julieanne Cotton, MD [facility]  . IR GENERIC HISTORICAL  03/27/2016   IR NEURO EACH ADD'L AFTER BASIC UNI RIGHT (MS) 03/27/2016 Julieanne Cotton, MD [facility]  . IR GENERIC HISTORICAL  03/27/2016   IR ANGIOGRAM FOLLOW UP STUDY 03/27/2016 Julieanne Cotton, MD [facility]  . IR GENERIC HISTORICAL  03/27/2016   IR TRANSCATH/EMBOLIZ 03/27/2016 Julieanne Cotton, MD [facility]  . IR GENERIC HISTORICAL  03/27/2016   IR ANGIO INTRA EXTRACRAN SEL INTERNAL CAROTID UNI R MOD SED 03/27/2016 Julieanne Cotton, MD [facility]  . IR GENERIC HISTORICAL  03/27/2016   IR ANGIOGRAM FOLLOW UP STUDY 03/27/2016 Julieanne Cotton, MD [facility]  . IR GENERIC HISTORICAL  04/12/2016   IR RADIOLOGIST EVAL & MGMT 04/12/2016 [facility]  . RADIOLOGY WITH ANESTHESIA N/A 02/29/2016   Procedure: EMBOLIZATION     (RADIOLOGY WITH ANESTHESIA);  Surgeon: Julieanne Cotton, MD;  Location: [facility] OR;  Service: Radiology;  Laterality: N/A;  . RADIOLOGY WITH ANESTHESIA N/A 03/27/2016  Procedure: Embolization;  Surgeon: Julieanne Cotton, MD;  Location: [facility] OR;  Service: Radiology;  Laterality: N/A;  . TOTAL HIP ARTHROPLASTY Right 09/23/2017  . TOTAL HIP ARTHROPLASTY Right 09/23/2017   Procedure: RIGHT TOTAL HIP ARTHROPLASTY ANTERIOR APPROACH;  Surgeon: Cammy Copa, MD;  Location: [facility] OR;  Service: Orthopedics;  Laterality: Right;  . TOTAL HIP ARTHROPLASTY Left 11/26/2017   Procedure: LEFT TOTAL HIP ARTHROPLASTY ANTERIOR APPROACH;  Surgeon: Cammy Copa, MD;  Location: [facility] OR;  Service:  Orthopedics;  Laterality: Left;  Marland Kitchen VIDEO BRONCHOSCOPY Bilateral 12/06/2013   Procedure: VIDEO BRONCHOSCOPY WITHOUT FLUORO;  Surgeon: Alyson Reedy, MD;  Location: [facility] ENDOSCOPY;  Service: Cardiopulmonary;  Laterality: Bilateral;    Allergies: Symbicort [budesonide-formoterol fumarate]; Dulera [mometasone furo-formoterol fum]; and Humira [adalimumab]  Medications: Prior to Admission medications   Medication Sig Start Date End Date Taking? Authorizing Provider  acetaminophen (TYLENOL) 500 MG tablet Take 1,000 mg by mouth daily as needed for moderate pain or headache.   Yes [provider]  ADVAIR DISKUS 500-50 MCG/DOSE AEPB INHALE 1 PUFF BY MOUTH INTO THE LUNGS 2 (TWO) TIMES DAILY. 11/05/17  Yes Kalman Shan, MD  albuterol (PROVENTIL HFA;VENTOLIN HFA) 108 (90 Base) MCG/ACT inhaler Inhale 2 puffs into the lungs every 4 (four) hours as needed for wheezing or shortness of breath. 11/15/16  Yes Nyoka Cowden, MD  albuterol (PROVENTIL) (2.5 MG/3ML) 0.083% nebulizer solution Take 3 mLs (2.5 mg total) by nebulization every 6 (six) hours as needed for wheezing or shortness of breath. DX: J44.9 04/03/17  Yes Bevelyn Ngo, NP  amLODipine (NORVASC) 10 MG tablet TAKE 1 TABLET (10 MG) BY MOUTH DAILY. Patient taking differently: TAKE 1 TABLET (10 MG) BY MOUTH DAILY AT NIGHT 03/12/17  Yes Saguier, Ramon Dredge, PA-C  b complex vitamins tablet Take 1 tablet by mouth daily.   Yes [provider]  cloNIDine (CATAPRES) 0.2 MG tablet Take 1 tablet (0.2 mg total) by mouth 2 (two) times daily. 05/19/16  Yes Minor, Vilinda Blanks, NP  DALIRESP 500 MCG TABS tablet TAKE 1 TABLET (500 MCG TOTAL) BY MOUTH DAILY. 09/25/17  Yes Kalman Shan, MD  famotidine (PEPCID) 20 MG tablet TAKE 1 TABLET (20 MG TOTAL) BY MOUTH AT BEDTIME. 09/26/17  Yes Saguier, Ramon Dredge, PA-C  fenofibrate 54 MG tablet TAKE 1 TABLET (54 MG TOTAL) BY MOUTH DAILY. 12/24/17  Yes Cammy Copa, MD  OXYGEN Inhale 2 L into the lungs at bedtime.    Yes [provider]  pantoprazole (PROTONIX) 40 MG tablet TAKE 1 TABLET (40 MG TOTAL) BY MOUTH DAILY AT 12:00 NOON. 11/17/17  Yes Saguier, Ramon Dredge, PA-C  tiotropium (SPIRIVA HANDIHALER) 18 MCG inhalation capsule PLACE 1 CAPSULE INTO INHALER AND INHALE INTO LUNGS DAILY. 01/09/17  Yes Bevelyn Ngo, NP  buPROPion [facility] SR) 150 MG 12 hr tablet Dailyx3days,then twice dailyx12 weeks. 12/18/17   Kalman Shan, MD     Family History  Problem Relation Age of Onset  . Cancer Mother   . Arthritis Mother   . Stroke Father   . Hypertension Father   . Healthy Daughter   . Healthy Son   . Colon cancer Neg Hx     Social History   Socioeconomic History  . Marital status: Married    Spouse name: Not on file  . Number of children: Not on file  . Years of education: Not on file  . Highest education level: Not on file  Occupational History  . Not on file  Social Needs  .  Financial resource strain: Not on file  . Food insecurity:    Worry: Not on file    Inability: Not on file  . Transportation needs:    Medical: Not on file    Non-medical: Not on file  Tobacco Use  . Smoking status: Light Tobacco Smoker    Packs/day: 0.50    Years: 40.00    Pack years: 20.00    Types: Cigarettes  . Smokeless tobacco: Never Used  . Tobacco comment: 12/18/17: 5 cigs per day  Substance and Sexual Activity  . Alcohol use: Yes    Alcohol/week: 1.2 - 1.8 oz    Types: 2 - 3 Shots of liquor per week    Comment: 3 drinks liquor per day  . Drug use: Yes    Types: Marijuana    Comment: 1-2 times per week  . Sexual activity: Yes  Lifestyle  . Physical activity:    Days per week: Not on file    Minutes per session: Not on file  . Stress: Not on file  Relationships  . Social connections:    Talks on phone: Not on file    Gets together: Not on file    Attends religious service: Not on file    Active member of club or organization: Not on file    Attends meetings of clubs or organizations:  Not on file    Relationship status: Not on file  Other Topics Concern  . Not on file  Social History Narrative  . Not on file    Review of Systems: A 12 point ROS discussed and pertinent positives are indicated in the HPI above.  All other systems are negative.  Review of Systems  Constitutional: Negative for activity change, fatigue and fever.  HENT: Negative for tinnitus and trouble swallowing.   Eyes: Negative for visual disturbance.  Respiratory: Negative for cough and shortness of breath.   Cardiovascular: Negative for chest pain.  Gastrointestinal: Negative for abdominal pain.  Musculoskeletal: Negative for back pain and gait problem.  Neurological: Negative for dizziness, tremors, seizures, syncope, facial asymmetry, speech difficulty, weakness, light-headedness, numbness and headaches.  Psychiatric/Behavioral: Negative for behavioral problems and confusion.    Vital Signs: BP (!) 150/100   Pulse 89   Temp 98 F (36.7 C) (Oral)   Resp 16   Ht 6' (1.829 m)   Wt 180 lb (81.6 kg)   SpO2 100%   BMI 24.41 kg/m   Physical Exam  Constitutional: He is oriented to person, place, and time. He appears well-nourished.  HENT:  Head: Atraumatic.  Eyes: EOM are normal.  Neck: Neck supple.  Cardiovascular: Normal rate, regular rhythm and normal heart sounds.  Pulmonary/Chest: Effort normal. He has wheezes.  Abdominal: Soft. Bowel sounds are normal.  Musculoskeletal: Normal range of motion.  Neurological: He is alert and oriented to person, place, and time.  Skin: Skin is warm and dry.  Psychiatric: He has a normal mood and affect. His behavior is normal. Judgment and thought content normal.  Nursing note and vitals reviewed.   Imaging: Dg Chest 2 View  Result Date: 12/18/2017 CLINICAL DATA:  History of right lung nodule, follow-up, smoking history EXAM: CHEST - 2 VIEW COMPARISON:  CT chest of 08/28/2017 ,CT chest of 02/24/2017 and CT chest of 09/15/2015 FINDINGS: By chest  x-ray the previous infiltrative pattern in the right upper lobe appears to have cleared with residual nodularity. The nodule in the left upper lobe is unchanged. The lungs remain hyperaerated consistent  with and element of emphysema. 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 No active process is seen currently and no effusion is noted. Mediastinal and hilar contours are unremarkable. The heart is within normal limits in size. No bony abnormality is seen. IMPRESSION: 1. Improvement in parenchymal infiltrate within the right upper lobe when compared to the CT scan of 08/28/2017. 2. 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 3. Emphysema. Electronically Signed   By: Dwyane Dee M.D.   On: 12/18/2017 14:23    Labs:  CBC: Recent Labs    09/15/17 0922 10/29/17 0929 11/13/17 0852 01/08/18 0650  WBC 10.4 10.4 8.3 9.0  HGB 13.7 12.7* 12.6* 13.4  HCT 41.8 38.8* 40.0 43.6  PLT 447* 470.0* 547* 455*    COAGS: Recent Labs    05/29/17 0620 01/08/18 0650  INR 0.91 0.90    BMP: Recent Labs    05/09/17 0815  06/22/17 1539 09/15/17 0922 10/29/17 0929 11/13/17 0852  NA  --    < > 136 137 138 136  K  --    < > 3.3* 4.8 5.2* 4.2  CL  --    < > 104 104 101 103  CO2  --   --  23 23 28 22   GLUCOSE  --    < > 127* 111* 114* 108*  BUN  --    < > 13 19 16 11   CALCIUM  --   --  9.3 10.1 10.3 9.4  CREATININE 1.29*   < > 1.04 1.30* 1.24 1.21  GFRNONAA >60  --  >60 59*  --  >60  GFRAA >60  --  >60 >60  --  >60   < > = values in this interval not displayed.    LIVER FUNCTION TESTS: Recent Labs    09/15/17 0922 10/29/17 0929  BILITOT 0.6 0.6  AST 18 17  ALT 19 16  ALKPHOS 81 95  PROT 6.5 7.4  ALBUMIN 3.6 4.1    TUMOR MARKERS: No results for input(s): AFPTM, CEA, CA199, CHROMGRNA  in the last 8760 hours.  Assessment and Plan:  R MCA aneurysm embolization 03/2016 Doing well No complaints; denies neurologic changes Scheduled for recheck cerebral arteriogram Risks and benefits of cerebral angiogram with intervention were discussed with the patient including, but not limited to bleeding, infection, vascular injury, contrast induced renal failure, stroke or even death.  This interventional procedure involves the use of X-rays and because of the nature of the planned procedure, it is possible that we will have prolonged use of X-ray fluoroscopy.  Potential radiation risks to you include (but are not limited to) the following: - A slightly elevated risk for cancer  several years later in life. This risk is typically less than 0.5% percent. This risk is low in comparison to the normal incidence of human cancer, which is 33% for women and 50% for men according to the American Cancer Society. - Radiation induced injury can include skin redness, resembling a rash, tissue breakdown / ulcers and hair loss (which can be temporary or permanent).   The likelihood of either of these occurring depends on the difficulty of the procedure and whether you are sensitive to radiation due to previous procedures, disease, or genetic conditions.   IF your procedure requires a prolonged use  of radiation, you will be notified and given written instructions for further action.  It is your responsibility to monitor the irradiated area for the 2 weeks following the procedure and to notify your physician if you are concerned that you have suffered a radiation induced injury.    All of the patient's questions were answered, patient is agreeable to proceed.  Consent signed and in chart.  Thank you for this interesting consult.  I greatly enjoyed meeting Derek Conner and look forward to participating in their care.  A copy of this report was sent to the requesting provider on this  date.  Electronically Signed: Robet Leu, PA-C 01/08/2018, 7:49 AM   I spent a total of    25 Minutes in face to face in clinical consultation, greater than 50% of which was counseling/coordinating care for cerebral arteriogram

## 2018-01-08 NOTE — Sedation Documentation (Signed)
etCO and nasal canula removed per MD

## 2018-01-13 ENCOUNTER — Encounter (HOSPITAL_COMMUNITY): Payer: Self-pay | Admitting: Interventional Radiology

## 2018-01-13 ENCOUNTER — Telehealth: Payer: Self-pay | Admitting: Internal Medicine

## 2018-01-13 MED ORDER — PREDNISONE 10 MG PO TABS: ORAL_TABLET | ORAL | 0 refills | Status: DC

## 2018-01-13 MED ORDER — DOXYCYCLINE HYCLATE 100 MG PO TABS: 100.0000 mg | ORAL_TABLET | Freq: Two times a day (BID) | ORAL | 0 refills | Status: DC

## 2018-01-13 MED FILL — DALIRESP 500 MCG TABLET: 500 | 30 days supply | Qty: 30 | Fill #3

## 2018-01-13 MED FILL — DOXYCYCLINE HYCLATE 100 MG: 100 | 5 days supply | Qty: 10 | Fill #0

## 2018-01-13 MED FILL — predniSONE 10 MG TABS: 10 | 5 days supply | Qty: 11 | Fill #0

## 2018-01-13 NOTE — Telephone Encounter (Signed)
Spoke with the pt and notified of recs per MR  He verbalized understanding  Rx was sent to pharm

## 2018-01-13 NOTE — Telephone Encounter (Signed)
  Take doxycycline 100mg  po twice daily x 5 days; take after meals and avoid sunlight   Please take prednisone 40 mg x1 day, then 30 mg x1 day, then 20 mg x1 day, then 10 mg x1 day, and then 5 mg x1 day and stop    Allergies  Allergen Reactions  . Symbicort [Budesonide-Formoterol Fumarate] Shortness Of Breath, Nausea And Vomiting and Other (See Comments)    heachache  . Dulera [Mometasone Furo-Formoterol Fum] Other (See Comments)    Headache   . Humira [Adalimumab] Nausea And Vomiting    Headaches

## 2018-01-13 NOTE — Telephone Encounter (Signed)
Pt c/o sinus congestion, pnd, chest congestion, sore throat, prod cough with yellow mucus X1 week.  Denies fever, chest pain, body aches Tried otc allergy med, nasal spray.  Requesting an abx.   Pharmacy:  [facility]

## 2018-01-16 ENCOUNTER — Ambulatory Visit (INDEPENDENT_AMBULATORY_CARE_PROVIDER_SITE_OTHER): Payer: 59 | Admitting: Orthopedic Surgery

## 2018-01-16 ENCOUNTER — Encounter (INDEPENDENT_AMBULATORY_CARE_PROVIDER_SITE_OTHER): Payer: Self-pay | Admitting: Orthopedic Surgery

## 2018-01-16 DIAGNOSIS — Z96642 Presence of left artificial hip joint: Secondary | ICD-10-CM

## 2018-01-16 MED FILL — cloNIDine HCL 0.2 MG TABS: 0.2 | 90 days supply | Qty: 180 | Fill #2

## 2018-01-17 ENCOUNTER — Encounter (INDEPENDENT_AMBULATORY_CARE_PROVIDER_SITE_OTHER): Payer: Self-pay | Admitting: Orthopedic Surgery

## 2018-01-17 NOTE — Progress Notes (Signed)
Post-Op Visit Note   Patient: Derek Conner           Date of Birth: 1960-12-06           MRN: [id number] Visit Date: 01/16/2018 PCP: Esperanza Richters, PA-C   Assessment & Plan:  Chief Complaint:  Chief Complaint  Patient presents with  . Follow-up    bilateral THA   Visit Diagnoses:  1. Status post total replacement of left hip     Plan: Amoni is a patient is now about 6 weeks out left total hip replacement.  He is doing well with minimal pain.  Equal leg lengths and excellent hip flexion strength and diminished left leg swelling.  As tolerated.  I will see him back as needed.  He is happy with his surgical result.  Follow-Up Instructions: Return if symptoms worsen or fail to improve.   Orders:  No orders of the defined types were placed in this encounter.  No orders of the defined types were placed in this encounter.   Imaging: No results found.  PMFS History: Patient Active Problem List   Diagnosis Date Noted  . 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 Nocturnal hypoxemia 04/03/2017  . Fatigue 01/09/2017  . Thrush 12/12/2016  . Brain aneurysm 03/27/2016  . Preop respiratory exam 03/26/2016  . Aneurysm (HCC)   . Chronic obstructive pulmonary disease with acute exacerbation (HCC) 01/19/2016  . Community acquired pneumonia 01/19/2016  . Cough syncope 01/19/2016  . Wellness examination 08/29/2015  . Hyperglycemia 01/06/2015  . HTN (hypertension) 12/09/2014  . COPD exacerbation (HCC) 07/08/2014  . Atypical chest pain 07/08/2014  . 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 Dry mouth 12/26/2013  . Cigarette smoker 12/26/2013  . Nodule of right lung 12/26/2013  . COPD, severity to be determined (HCC) 12/26/2013  . Pulmonary nodule, right 12/06/2013  . Hemoptysis 12/03/2013  . COPD (chronic obstructive pulmonary disease) (HCC) 12/03/2013  . Acute bronchitis 12/03/2013  . Tobacco use disorder 12/03/2013   Past Medical History:  Diagnosis Date  . Asthma   . CAP (community  acquired pneumonia) 01/19/2016  . Cerebral aneurysm   . Chronic bronchitis (HCC)   . COPD (chronic obstructive pulmonary disease) (HCC)   . Emphysema lung (HCC)   . Emphysema of lung (HCC)   . GERD (gastroesophageal reflux disease)   . Hepatitis A infection ~ 1980  . High cholesterol   . Hypertension   . Pneumonia 1960s  . Rheumatoid arthritis (HCC)    "hands mostly"  ( 01/19/2016)  . Situational depression December 01, 2009   "only when my mother died"  . Stroke [facility])    mini stroke when doing embolization, 2015/12/02, no deficits    Family History  Problem Relation Age of Onset  . Cancer Mother   . Arthritis Mother   . Stroke Father   . Hypertension Father   . Healthy Daughter   . Healthy Son   . Colon cancer Neg Hx     Past Surgical History:  Procedure Laterality Date  . APPENDECTOMY  1980's  . CHEST TUBE INSERTION Left 1990's   "lung collapsed"  . CYSTECTOMY Left 1960's   "wrist"  . FOOT FRACTURE SURGERY Left ~ Dec 02, 2003   "it was crushed"  . IR 3D INDEPENDENT WKST  01/08/2018  . IR ANGIO INTRA EXTRACRAN SEL COM CAROTID INNOMINATE BILAT MOD SED  01/08/2018  . IR ANGIO VERTEBRAL SEL VERTEBRAL BILAT MOD SED  01/08/2018  . IR GENERIC HISTORICAL  03/27/2016   IR ANGIOGRAM FOLLOW  UP STUDY 03/27/2016 Julieanne Cotton, MD [facility]  . IR GENERIC HISTORICAL  03/27/2016   IR ANGIOGRAM FOLLOW UP STUDY 03/27/2016 Julieanne Cotton, MD [facility]  . IR GENERIC HISTORICAL  03/27/2016   IR ANGIO INTRA EXTRACRAN SEL COM CAROTID INNOMINATE UNI L MOD SED 03/27/2016 Julieanne Cotton, MD [facility]  . IR GENERIC HISTORICAL  03/27/2016   IR ANGIOGRAM FOLLOW UP STUDY 03/27/2016 Julieanne Cotton, MD [facility]  . IR GENERIC HISTORICAL  03/27/2016   IR ANGIO VERTEBRAL SEL SUBCLAVIAN INNOMINATE UNI R MOD SED 03/27/2016 Julieanne Cotton, MD [facility]  . IR GENERIC HISTORICAL  03/27/2016   IR 3D INDEPENDENT WKST 03/27/2016 Julieanne Cotton, MD [facility]  . IR GENERIC HISTORICAL  03/27/2016   IR ANGIOGRAM  FOLLOW UP STUDY 03/27/2016 Julieanne Cotton, MD [facility]  . IR GENERIC HISTORICAL  03/27/2016   IR NEURO EACH ADD'L AFTER BASIC UNI RIGHT (MS) 03/27/2016 Julieanne Cotton, MD [facility]  . IR GENERIC HISTORICAL  03/27/2016   IR ANGIOGRAM FOLLOW UP STUDY 03/27/2016 Julieanne Cotton, MD [facility]  . IR GENERIC HISTORICAL  03/27/2016   IR TRANSCATH/EMBOLIZ 03/27/2016 Julieanne Cotton, MD [facility]  . IR GENERIC HISTORICAL  03/27/2016   IR ANGIO INTRA EXTRACRAN SEL INTERNAL CAROTID UNI R MOD SED 03/27/2016 Julieanne Cotton, MD [facility]  . IR GENERIC HISTORICAL  03/27/2016   IR ANGIOGRAM FOLLOW UP STUDY 03/27/2016 Julieanne Cotton, MD [facility]  . IR GENERIC HISTORICAL  04/12/2016   IR RADIOLOGIST EVAL & MGMT 04/12/2016 [facility]  . RADIOLOGY WITH ANESTHESIA N/A 02/29/2016   Procedure: EMBOLIZATION     (RADIOLOGY WITH ANESTHESIA);  Surgeon: Julieanne Cotton, MD;  Location: [facility] OR;  Service: Radiology;  Laterality: N/A;  . RADIOLOGY WITH ANESTHESIA N/A 03/27/2016   Procedure: Embolization;  Surgeon: Julieanne Cotton, MD;  Location: [facility] OR;  Service: Radiology;  Laterality: N/A;  . TOTAL HIP ARTHROPLASTY Right 09/23/2017  . TOTAL HIP ARTHROPLASTY Right 09/23/2017   Procedure: RIGHT TOTAL HIP ARTHROPLASTY ANTERIOR APPROACH;  Surgeon: Cammy Copa, MD;  Location: [facility] OR;  Service: Orthopedics;  Laterality: Right;  . TOTAL HIP ARTHROPLASTY Left 11/26/2017   Procedure: LEFT TOTAL HIP ARTHROPLASTY ANTERIOR APPROACH;  Surgeon: Cammy Copa, MD;  Location: [facility] OR;  Service: Orthopedics;  Laterality: Left;  Marland Kitchen VIDEO BRONCHOSCOPY Bilateral 12/06/2013   Procedure: VIDEO BRONCHOSCOPY WITHOUT FLUORO;  Surgeon: Alyson Reedy, MD;  Location: [facility] ENDOSCOPY;  Service: Cardiopulmonary;  Laterality: Bilateral;   Social History   Occupational History  . Not on file  Tobacco Use  . Smoking status: Light Tobacco Smoker    Packs/day: 0.50    Years: 40.00    Pack years: 20.00    Types:  Cigarettes  . Smokeless tobacco: Never Used  . Tobacco comment: 12/18/17: 5 cigs per day  Substance and Sexual Activity  . Alcohol use: Yes    Alcohol/week: 1.2 - 1.8 oz    Types: 2 - 3 Shots of liquor per week    Comment: 3 drinks liquor per day  . Drug use: Yes    Types: Marijuana    Comment: 1-2 times per week  . Sexual activity: Yes

## 2018-01-23 ENCOUNTER — Other Ambulatory Visit: Payer: Self-pay | Admitting: Acute Care

## 2018-01-23 ENCOUNTER — Telehealth: Payer: Self-pay | Admitting: Acute Care

## 2018-01-23 ENCOUNTER — Other Ambulatory Visit (INDEPENDENT_AMBULATORY_CARE_PROVIDER_SITE_OTHER): Payer: Self-pay | Admitting: Orthopedic Surgery

## 2018-01-23 DIAGNOSIS — J441 Chronic obstructive pulmonary disease with (acute) exacerbation: Secondary | ICD-10-CM

## 2018-01-23 MED ORDER — TIOTROPIUM BROMIDE MONOHYDRATE 18 MCG IN CAPS: ORAL_CAPSULE | RESPIRATORY_TRACT | 3 refills | Status: DC

## 2018-01-23 MED FILL — SPIRIVA 18 MCG CP-HANDIHALE: 18 | 90 days supply | Qty: 90 | Fill #0

## 2018-01-23 MED FILL — FENOFIBRATE 54 MG TABLET: 54 | 30 days supply | Qty: 30 | Fill #0

## 2018-01-23 NOTE — Telephone Encounter (Signed)
Refill request was sent to pt's pharmacy.  Called pt letting him know this was done. Pt expressed understanding.  Nothing further needed.

## 2018-01-23 NOTE — Telephone Encounter (Signed)
Pt is calling back about medication. Pt is at pharm waiting.  Cb is [phone]

## 2018-01-28 ENCOUNTER — Other Ambulatory Visit: Payer: 59

## 2018-01-28 ENCOUNTER — Ambulatory Visit (INDEPENDENT_AMBULATORY_CARE_PROVIDER_SITE_OTHER)
Admission: RE | Admit: 2018-01-28 | Discharge: 2018-01-28 | Disposition: A | Discharge status: Home / Self Care | Payer: 59 | Source: Ambulatory Visit | Attending: Acute Care | Admitting: Acute Care

## 2018-01-28 ENCOUNTER — Ambulatory Visit (INDEPENDENT_AMBULATORY_CARE_PROVIDER_SITE_OTHER): Payer: 59 | Admitting: Acute Care

## 2018-01-28 ENCOUNTER — Encounter: Payer: Self-pay | Admitting: Acute Care

## 2018-01-28 VITALS — BP 148/80 | HR 101 | Ht 72.0 in | Wt 178.8 lb

## 2018-01-28 DIAGNOSIS — F17210 Nicotine dependence, cigarettes, uncomplicated: Secondary | ICD-10-CM

## 2018-01-28 DIAGNOSIS — G4734 Idiopathic sleep related nonobstructive alveolar hypoventilation: Secondary | ICD-10-CM | POA: Diagnosis not present

## 2018-01-28 DIAGNOSIS — A310 Pulmonary mycobacterial infection: Secondary | ICD-10-CM | POA: Diagnosis not present

## 2018-01-28 DIAGNOSIS — R05 Cough: Secondary | ICD-10-CM | POA: Diagnosis not present

## 2018-01-28 DIAGNOSIS — J441 Chronic obstructive pulmonary disease with (acute) exacerbation: Secondary | ICD-10-CM

## 2018-01-28 DIAGNOSIS — R0602 Shortness of breath: Secondary | ICD-10-CM | POA: Diagnosis not present

## 2018-01-28 MED ORDER — LEVOFLOXACIN 750 MG PO TABS: 750.0000 mg | ORAL_TABLET | Freq: Every day | ORAL | 0 refills | Status: DC

## 2018-01-28 MED ORDER — PREDNISONE 10 MG PO TABS: ORAL_TABLET | ORAL | 0 refills | Status: DC

## 2018-01-28 MED ORDER — HYDROCODONE-HOMATROPINE 5-1.5 MG/5ML PO SYRP: 5.0000 mL | ORAL_SOLUTION | Freq: Every evening | ORAL | 0 refills | Status: DC | PRN

## 2018-01-28 MED FILL — predniSONE 10 MG TABS: 10 | 8 days supply | Qty: 20 | Fill #0

## 2018-01-28 MED FILL — levoFLOXacin 750 MG TABS: 750 | 5 days supply | Qty: 5 | Fill #0

## 2018-01-28 MED FILL — HYDROCODONE-HOMATROPINE SOL: 5-1.5 | 48 days supply | Qty: 240 | Fill #0

## 2018-01-28 NOTE — Assessment & Plan Note (Addendum)
Slow to resolve flare Treated 2 weeks ago with Doxycycline and prednisome taper Plan: CXR today to RO pneumonia. Prednisone taper; 10 mg tablets: 4 tabs x 2 days, 3 tabs x 2 days, 2 tabs x 2 days 1 tab x 2 days then stop. Levaquin 750  mg x 5 days. Take Probiotic with antibiotic. Sputum for Culture, AFB and Fungus now before antibiotic started.. Mucinex 1200 mg once daily. Hydromet cough syrup 5 cc's at bedtime. Don't drive if sleepy. Continue wearing oxygen at 2 L [place] at bedtime. Saturation goals are 88-92% Follow up CT scan 02/2018 Work on quitting smoking. We recommend wearing your nocturnal oxygen for your night time desaturations. Follow up in 2 weeks with Dr. Marchelle Gearing or Maralyn Sago NP Please contact office for sooner follow up if symptoms do not improve or worsen or seek emergency care     .

## 2018-01-28 NOTE — Assessment & Plan Note (Addendum)
Continues to smoke daily despite symptoms Discussed risks or continued tobacco abuse Not taking Wellbutrin Dr. Marchelle Gearing prescribed. States sie effects prevented use. Did not call the office to ask for alternative smoking cessation assistance Plan: I have spent 3 minutes counseling patient on smoking cessation this visit. Counseled not to wear oxygen while smoking>> risks reviewed Follow up LDCT due 02/2018 for Lung RADS 3  Lung  RADS 3, nodules that are probably benign findings, short term follow up suggested: includes nodules with a low likelihood of becoming a clinically active cancer. Radiology recommends a 6 month repeat LDCT follow up.

## 2018-01-28 NOTE — Assessment & Plan Note (Signed)
Non-compliant with oxygen States it is too uncomfortable to use Plan Counseled to use nocturnal oxygen.

## 2018-01-28 NOTE — Patient Instructions (Addendum)
CXR today. Prednisone taper; 10 mg tablets: 4 tabs x 2 days, 3 tabs x 2 days, 2 tabs x 2 days 1 tab x 2 days then stop. Levaquin 750  mg x 5 days. Take Probiotic with antibiotic. Sputum for Culture, AFB and Fungus. Mucinex 1200 mg once daily. Hydromet cough syrup 5 cc's at bedtime. Don't drive if sleepy. Continue wearing oxygen at 2 L [place] at bedtime. Saturation goals are 88-92% Follow up CT scan 02/2018 Work on quitting smoking. We recommend wearing your nocturnal oxygen for your night time desaturations. Follow up in [address] Marchelle Gearing or Maralyn Sago NP Please contact office for sooner follow up if symptoms do not improve or worsen or seek emergency care

## 2018-01-28 NOTE — Progress Notes (Signed)
History of Present Illness Derek Conner is a 57 y.o. male current every day smoker with GOLD III copd as of 10/25/16 PFT, with frequent exacerbations requiring antibiotic and prednisone tapers, and nocturnal desaturations ( nocturnal oxygen prescribed, but not worn)  . He is followed by Dr. Marchelle Gearing.   01/28/2018  Pt. Presents for Acute OV.  Pt.  was last seen in the office 12/18/2017. He was started on Wellbutrin at that time to assist with smoking cessation. :  He states he is currently smoking 5-10   cigarettes per day. He called the office 01/13/2018 with request for antibiotics with complaints of sinus congestion, pnd, chest congestion, sore throat, prod cough with yellow mucus X1 week. At the time he denied any fever. Dr. Marchelle Gearing called in prescription for Doxycycline 100 mg BID and 5 day Prednisone taper. He presents today with complaints of wheezing, green mucus an cough. He states he did complete his antibiotic and prednisone taper. He states he experienced little relief with treatment.This is a worse flare than his normal flares.He is using his nebs 4 times today . He states he has had a fever. His secretions are green to yellow. They are thick. He has not been using Mucinex. He states he has productive cough. He is continuing to smoke. He states it Is hard to take a deep breath.He has body aches and fatigue. He has no energy. He is compliant with his Spiriva, Advair, and Daliresp. Last low-dose lung cancer screening CT was read as a Lung RADS 4 A : suspicious findings, either short term follow up in 3 months or alternatively .3 month Follow up CT is due to July 2018. He states he is not wearing his nocturnal oxygen as it is irritating.He is not using the Wellbutrin as he states the side effects were too bad.  Test Results: CXR 01/28/2018>> IMPRESSION: COPD. Nodular opacities in the lung apices secondary to chronic atypical infections, unchanged.  CXR  12/18/2017: Improvement in  parenchymal infiltrate within the right upper lobe when compared to the CT scan of 08/28/2017. 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 Emphysema.  Recent bilateral hip replacements 09/2017 and 11/2017    CBC Latest Ref Rng & Units 01/08/2018 11/13/2017 10/29/2017  WBC 4.0 - 10.5 K/uL 9.0 8.3 10.4  Hemoglobin 13.0 - 17.0 g/dL 40.9 12.6(L) 12.7(L)  Hematocrit 39.0 - 52.0 % 43.6 40.0 38.8(L)  Platelets 150 - 400 K/uL 455(H) 547(H) 470.0(H)    BMP Latest Ref Rng & Units 01/08/2018 11/13/2017 10/29/2017  Glucose 65 - 99 mg/dL 811(B) 147(W) 295(A)  BUN 6 - 20 mg/dL 15 11 16   Creatinine 0.61 - 1.24 mg/dL 2.13(Y) 8.65 7.84  Sodium 135 - 145 mmol/L 139 136 138  Potassium 3.5 - 5.1 mmol/L 4.0 4.2 5.2(H)  Chloride 101 - 111 mmol/L 103 103 101  CO2 22 - 32 mmol/L 26 22 28   Calcium 8.9 - 10.3 mg/dL 69.6 9.4 29.5    BNP    Component Value Date/Time   BNP 17.9 05/16/2016 1523    ProBNP No results found for: PROBNP  PFT    Component Value Date/Time   FEV1PRE 1.38 10/25/2016 1028   FEV1POST 1.10 02/15/2014 1202   FVCPRE 3.24 10/25/2016 1028   FVCPOST 3.10 02/15/2014 1202   TLC 9.36 02/15/2014 1202   DLCOUNC 24.75 10/25/2016 1028   PREFEV1FVCRT 43 10/25/2016 1028   PSTFEV1FVCRT 36  02/15/2014 1202    Dg Chest 2 View  Result Date: 01/28/2018 CLINICAL DATA:  Productive cough and shortness of breath EXAM: CHEST - 2 VIEW COMPARISON:  Chest radiograph 12/18/2017 FINDINGS: The lungs are hyperinflated. The cardiomediastinal contours are normal. There is diffuse interstitial coarsening. There are multiple nodular opacities at the lung apices, unchanged. No focal consolidation or pulmonary edema. There is no pleural effusion or pneumothorax. IMPRESSION: COPD. Nodular opacities in the lung apices secondary to chronic atypical infections, unchanged. Electronically Signed   By: Deatra Robinson  M.D.   On: 01/28/2018 16:05   Ir 3d Andy Gauss  Result Date: 01/13/2018 CLINICAL DATA:  Occasional right-sided headaches. 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 EXAM: IR ANGIO VERTEBRAL SEL VERTEBRAL BILAT MOD SED; WORKSTATION 3D RECONSTRUCTION; BILATERAL COMMON CAROTID AND INNOMINATE ANGIOGRAPHY COMPARISON:  Angiogram of 02/29/2016. MEDICATIONS: Heparin 1000 units IV; no antibiotic was administered within 1 hour of the procedure. ANESTHESIA/SEDATION: Versed 1 mg IV; Fentanyl 50 mcg IV. Moderate Sedation Time:  41 minutes. The patient was continuously monitored during the procedure by the interventional radiology nurse under my direct supervision. CONTRAST:  Isovue 300 approximately 80 mL. FLUOROSCOPY TIME:  Fluoroscopy Time: 7 minutes 43 seconds (763 mGy). COMPLICATIONS: None immediate. TECHNIQUE: Informed written consent was obtained from the patient after a thorough discussion of the procedural risks, benefits and alternatives. All questions were addressed. Maximal Sterile Barrier Technique was utilized including caps, mask, sterile gowns, sterile gloves, sterile drape, hand hygiene and skin antiseptic. A timeout was performed prior to the initiation of the procedure. The right groin was prepped and draped in the usual sterile fashion. Thereafter using modified Seldinger technique, transfemoral access into the right common femoral artery was obtained without difficulty. Over a 0.035 inch guidewire, a 5 French Pinnacle sheath was inserted. Through this, and also over 0.035 inch guidewire, a 5 [place] JB 1 catheter was advanced to the aortic arch region and selectively positioned in the right common carotid artery, the right vertebral artery, the left common carotid artery and the left vertebral artery. FINDINGS: The right common carotid arteriogram demonstrates the right external carotid artery and its major branches to be widely patent. The right internal  carotid artery at the bulb to the cranial skull base opacifies normally. The petrous, cavernous and supraclinoid segments are widely patent. The right middle cerebral artery and the right anterior cerebral artery opacify normally into the capillary and venous phases. Again demonstrated is the right MCA trifurcation region previously endovascularly coiled aneurysm with a small recanalized portion at the neck of the aneurysm. This measures approximately 3.2 mm x 3.2 mm. The right vertebral artery origin is widely patent. The vessel is seen to opacify normally to the cranial skull base. Wide patency is seen of the right vertebrobasilar junction and the right posterior-inferior cerebellar artery. The basilar artery, the posterior cerebral arteries, the superior cerebellar arteries and the anterior-inferior cerebellar arteries opacify normally into the capillary and venous phases. Non-opacified blood is seen in the basilar artery from the contralateral vertebral artery. There is suggestion of a high-riding left internal jugular bulb. The left common carotid arteriogram demonstrates the left external carotid artery and its major branches to be widely patent. The left internal carotid artery at the bulb to the cranial skull base opacifies normally. The petrous, cavernous and supraclinoid segments are widely patent. The left middle cerebral artery and the left anterior cerebral artery opacify normally into the capillary and venous phases. A prominent vein of  Labbe a developmental variation is noted. The left vertebral artery origin is from the aortic arch between the origins of the left common carotid artery and left vertebral artery. The vessel opacifies normally to the cranial skull base. Wide patency is seen of the left vertebrobasilar junction and the left posterior-inferior cerebellar artery. The opacified portion of the basilar artery, the posterior cerebral arteries, the superior cerebellar arteries and the  anterior-inferior cerebellar arteries is normal into the capillary and venous phases. 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 This is probably related to coil compaction. PLAN: Angiographic findings were reviewed with patient and the patient's wife. Brought to their attention was the recanalized portion. It was decided that the patient would undergo a follow-up MRA examination of the brain, and MRI of the brain with and without contrast in 6 months time. Should the patient develop symptoms or worsening headaches, or left-sided facial droop or weakness on the left side, they were instructed to call 911. They both leave with good understanding and agreement with the above management plan. Electronically Signed   By: Julieanne Cotton M.D.   On: 01/08/2018 10:40   Ir Angio Intra Extracran Sel Com Carotid Innominate Bilat Mod Sed  Result Date: 01/13/2018 CLINICAL DATA:  Occasional right-sided headaches. 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 EXAM: IR ANGIO VERTEBRAL SEL VERTEBRAL BILAT MOD SED; WORKSTATION 3D RECONSTRUCTION; BILATERAL COMMON CAROTID AND INNOMINATE ANGIOGRAPHY COMPARISON:  Angiogram of 02/29/2016. MEDICATIONS: Heparin 1000 units IV; no antibiotic was administered within 1 hour of the procedure. ANESTHESIA/SEDATION: Versed 1 mg IV; Fentanyl 50 mcg IV. Moderate Sedation Time:  41 minutes. The patient was continuously monitored during the procedure by the interventional radiology nurse under my direct supervision. CONTRAST:  Isovue 300 approximately 80 mL. FLUOROSCOPY TIME:  Fluoroscopy Time: 7 minutes 43 seconds (763 mGy). COMPLICATIONS: None immediate. TECHNIQUE: Informed written consent was obtained from the patient after a thorough discussion of the procedural risks, benefits and alternatives. All questions were  addressed. Maximal Sterile Barrier Technique was utilized including caps, mask, sterile gowns, sterile gloves, sterile drape, hand hygiene and skin antiseptic. A timeout was performed prior to the initiation of the procedure. The right groin was prepped and draped in the usual sterile fashion. Thereafter using modified Seldinger technique, transfemoral access into the right common femoral artery was obtained without difficulty. Over a 0.035 inch guidewire, a 5 French Pinnacle sheath was inserted. Through this, and also over 0.035 inch guidewire, a 5 [place] JB 1 catheter was advanced to the aortic arch region and selectively positioned in the right common carotid artery, the right vertebral artery, the left common carotid artery and the left vertebral artery. FINDINGS: The right common carotid arteriogram demonstrates the right external carotid artery and its major branches to be widely patent. The right internal carotid artery at the bulb to the cranial skull base opacifies normally. The petrous, cavernous and supraclinoid segments are widely patent. The right middle cerebral artery and the right anterior cerebral artery opacify normally into the capillary and venous phases. Again demonstrated is the right MCA trifurcation region previously endovascularly coiled aneurysm with a small recanalized portion at the neck of the aneurysm. This measures approximately 3.2 mm x 3.2 mm. The right vertebral artery origin is widely patent. The vessel is seen to opacify normally to the cranial skull base. Wide patency is seen of the  right vertebrobasilar junction and the right posterior-inferior cerebellar artery. The basilar artery, the posterior cerebral arteries, the superior cerebellar arteries and the anterior-inferior cerebellar arteries opacify normally into the capillary and venous phases. Non-opacified blood is seen in the basilar artery from the contralateral vertebral artery. There is suggestion of a high-riding left  internal jugular bulb. The left common carotid arteriogram demonstrates the left external carotid artery and its major branches to be widely patent. The left internal carotid artery at the bulb to the cranial skull base opacifies normally. The petrous, cavernous and supraclinoid segments are widely patent. The left middle cerebral artery and the left anterior cerebral artery opacify normally into the capillary and venous phases. A prominent vein of Labbe a developmental variation is noted. The left vertebral artery origin is from the aortic arch between the origins of the left common carotid artery and left vertebral artery. The vessel opacifies normally to the cranial skull base. Wide patency is seen of the left vertebrobasilar junction and the left posterior-inferior cerebellar artery. The opacified portion of the basilar artery, the posterior cerebral arteries, the superior cerebellar arteries and the anterior-inferior cerebellar arteries is normal into the capillary and venous phases. 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 This is probably related to coil compaction. PLAN: Angiographic findings were reviewed with patient and the patient's wife. Brought to their attention was the recanalized portion. It was decided that the patient would undergo a follow-up MRA examination of the brain, and MRI of the brain with and without contrast in 6 months time. Should the patient develop symptoms or worsening headaches, or left-sided facial droop or weakness on the left side, they were instructed to call 911. They both leave with good understanding and agreement with the above management plan. Electronically Signed   By: Julieanne Cotton M.D.   On: 01/08/2018 10:40   Ir Angio Vertebral Sel Vertebral Bilat Mod Sed  Result Date: 01/13/2018 CLINICAL DATA:  Occasional right-sided headaches.  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 EXAM: IR ANGIO VERTEBRAL SEL VERTEBRAL BILAT MOD SED; WORKSTATION 3D RECONSTRUCTION; BILATERAL COMMON CAROTID AND INNOMINATE ANGIOGRAPHY COMPARISON:  Angiogram of 02/29/2016. MEDICATIONS: Heparin 1000 units IV; no antibiotic was administered within 1 hour of the procedure. ANESTHESIA/SEDATION: Versed 1 mg IV; Fentanyl 50 mcg IV. Moderate Sedation Time:  41 minutes. The patient was continuously monitored during the procedure by the interventional radiology nurse under my direct supervision. CONTRAST:  Isovue 300 approximately 80 mL. FLUOROSCOPY TIME:  Fluoroscopy Time: 7 minutes 43 seconds (763 mGy). COMPLICATIONS: None immediate. TECHNIQUE: Informed written consent was obtained from the patient after a thorough discussion of the procedural risks, benefits and alternatives. All questions were addressed. Maximal Sterile Barrier Technique was utilized including caps, mask, sterile gowns, sterile gloves, sterile drape, hand hygiene and skin antiseptic. A timeout was performed prior to the initiation of the procedure. The right groin was prepped and draped in the usual sterile fashion. Thereafter using modified Seldinger technique, transfemoral access into the right common femoral artery was obtained without difficulty. Over a 0.035 inch guidewire, a 5 French Pinnacle sheath was inserted. Through this, and also over 0.035 inch guidewire, a 5 [place] JB 1 catheter was advanced to the aortic arch region and selectively positioned in the right common carotid artery, the right vertebral artery, the left common carotid artery and the left vertebral artery. FINDINGS:  The right common carotid arteriogram demonstrates the right external carotid artery and its major branches to be widely patent. The right internal carotid artery at the bulb to the cranial skull base opacifies normally. The petrous, cavernous and supraclinoid segments are  widely patent. The right middle cerebral artery and the right anterior cerebral artery opacify normally into the capillary and venous phases. Again demonstrated is the right MCA trifurcation region previously endovascularly coiled aneurysm with a small recanalized portion at the neck of the aneurysm. This measures approximately 3.2 mm x 3.2 mm. The right vertebral artery origin is widely patent. The vessel is seen to opacify normally to the cranial skull base. Wide patency is seen of the right vertebrobasilar junction and the right posterior-inferior cerebellar artery. The basilar artery, the posterior cerebral arteries, the superior cerebellar arteries and the anterior-inferior cerebellar arteries opacify normally into the capillary and venous phases. Non-opacified blood is seen in the basilar artery from the contralateral vertebral artery. There is suggestion of a high-riding left internal jugular bulb. The left common carotid arteriogram demonstrates the left external carotid artery and its major branches to be widely patent. The left internal carotid artery at the bulb to the cranial skull base opacifies normally. The petrous, cavernous and supraclinoid segments are widely patent. The left middle cerebral artery and the left anterior cerebral artery opacify normally into the capillary and venous phases. A prominent vein of Labbe a developmental variation is noted. The left vertebral artery origin is from the aortic arch between the origins of the left common carotid artery and left vertebral artery. The vessel opacifies normally to the cranial skull base. Wide patency is seen of the left vertebrobasilar junction and the left posterior-inferior cerebellar artery. The opacified portion of the basilar artery, the posterior cerebral arteries, the superior cerebellar arteries and the anterior-inferior cerebellar arteries is normal into the capillary and venous phases. 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 This is probably related to coil compaction. PLAN: Angiographic findings were reviewed with patient and the patient's wife. Brought to their attention was the recanalized portion. It was decided that the patient would undergo a follow-up MRA examination of the brain, and MRI of the brain with and without contrast in 6 months time. Should the patient develop symptoms or worsening headaches, or left-sided facial droop or weakness on the left side, they were instructed to call 911. They both leave with good understanding and agreement with the above management plan. Electronically Signed   By: Julieanne Cotton M.D.   On: 01/08/2018 10:40     Past medical hx Past Medical History:  Diagnosis Date  . Asthma   . CAP (community acquired pneumonia) 01/19/2016  . Cerebral aneurysm   . Chronic bronchitis (HCC)   . COPD (chronic obstructive pulmonary disease) (HCC)   . Emphysema lung (HCC)   . Emphysema of lung (HCC)   . GERD (gastroesophageal reflux disease)   . Hepatitis A infection ~ 1980  . High cholesterol   . Hypertension   . Pneumonia 1960s  . Rheumatoid arthritis (HCC)    "hands mostly"  ( 01/19/2016)  . Situational depression 2010-01-08   "only when my mother died"  . Stroke [facility])    mini stroke when doing embolization, 09-Jan-2016, no deficits     Social History   Tobacco Use  . Smoking status: Current Every Day Smoker  Packs/day: 1.00    Years: 40.00    Pack years: 40.00    Types: Cigarettes  . Smokeless tobacco: Never Used  . Tobacco comment: 12/18/17: 5 cigs per day  Substance Use Topics  . Alcohol use: Yes    Alcohol/week: 1.2 - 1.8 oz    Types: 2 - 3 Shots of liquor per week    Comment: 3 drinks liquor per day  . Drug use: Yes    Types: Marijuana    Comment: 1-2 times per week    Mr.Domingo reports that he has been smoking cigarettes.  He has a 40.00  pack-year smoking history. He has never used smokeless tobacco. He reports that he drinks about 1.2 - 1.8 oz of alcohol per week. He reports that he has current or past drug history. Drug: Marijuana.  Tobacco Cessation: Current every day smoker despite continued counseling I have spent 3 minutes counseling patient on smoking cessation this visit. Pt. Verbalizes understanding that his smoking is a direct correlation to his respiratory issues.   Past surgical hx, Family hx, Social hx all reviewed.  Current Outpatient Medications on File Prior to Visit  Medication Sig  . acetaminophen (TYLENOL) 500 MG tablet Take 1,000 mg by mouth daily as needed for moderate pain or headache.  . ADVAIR DISKUS 500-50 MCG/DOSE AEPB INHALE 1 PUFF BY MOUTH INTO THE LUNGS 2 (TWO) TIMES DAILY.  Marland Kitchen albuterol (PROVENTIL HFA;VENTOLIN HFA) 108 (90 Base) MCG/ACT inhaler Inhale 2 puffs into the lungs every 4 (four) hours as needed for wheezing or shortness of breath.  Marland Kitchen albuterol (PROVENTIL) (2.5 MG/3ML) 0.083% nebulizer solution Take 3 mLs (2.5 mg total) by nebulization every 6 (six) hours as needed for wheezing or shortness of breath. DX: J44.9  . amLODipine (NORVASC) 10 MG tablet TAKE 1 TABLET (10 MG) BY MOUTH DAILY. (Patient taking differently: TAKE 1 TABLET (10 MG) BY MOUTH DAILY AT NIGHT)  . b complex vitamins tablet Take 1 tablet by mouth daily.  . cloNIDine (CATAPRES) 0.2 MG tablet Take 1 tablet (0.2 mg total) by mouth 2 (two) times daily.  Marland Kitchen DALIRESP 500 MCG TABS tablet TAKE 1 TABLET (500 MCG TOTAL) BY MOUTH DAILY.  . famotidine (PEPCID) 20 MG tablet TAKE 1 TABLET (20 MG TOTAL) BY MOUTH AT BEDTIME.  . fenofibrate 54 MG tablet TAKE 1 TABLET (54 MG TOTAL) BY MOUTH DAILY.  Marland Kitchen OXYGEN Inhale 2 L into the lungs at bedtime.  . pantoprazole (PROTONIX) 40 MG tablet TAKE 1 TABLET (40 MG TOTAL) BY MOUTH DAILY AT 12:00 NOON.  Marland Kitchen predniSONE (DELTASONE) 10 MG tablet 4 x 1 day, 3 x 1 day, 2 x 1 day 1 x 1 day, 1/2 x 1 day and stop    . tiotropium (SPIRIVA HANDIHALER) 18 MCG inhalation capsule PLACE 1 CAPSULE INTO INHALER AND INHALE INTO LUNGS DAILY.   Current Facility-Administered Medications on File Prior to Visit  Medication  . 0.9 %  sodium chloride infusion  . aspirin EC tablet 325 mg  . niMODipine (NIMOTOP) capsule 0-60 mg     Allergies  Allergen Reactions  . Symbicort [Budesonide-Formoterol Fumarate] Shortness Of Breath, Nausea And Vomiting and Other (See Comments)    heachache  . Dulera [Mometasone Furo-Formoterol Fum] Other (See Comments)    Headache   . Humira [Adalimumab] Nausea And Vomiting    Headaches     Review Of Systems:  Constitutional:   No  weight loss, night sweats, + Fevers, +chills, +fatigue, or  lassitude.  HEENT:   +  headaches,  Difficulty swallowing,  Tooth/dental problems, or  Sore throat,                No sneezing, itching, ear ache, nasal congestion, post nasal drip,   CV:  No chest pain,  Orthopnea, PND, swelling in lower extremities, anasarca, dizziness, palpitations, syncope.   GI  No heartburn, indigestion, abdominal pain, nausea, vomiting, diarrhea, change in bowel habits, loss of appetite, bloody stools.   Resp: + shortness of breath with exertion or at rest.  + excess mucus, + productive cough,  No non-productive cough,  No coughing up of blood.  + change in color of mucus.  + wheezing.  No chest wall deformity  Skin: no rash or lesions.  GU: no dysuria, change in color of urine, no urgency or frequency.  No flank pain, no hematuria   MS:  + joint pain ( Recent hip replacement surgeries)  or swelling.  + decreased range of motion.  No back pain.  Psych:  No change in mood or affect. No depression or anxiety.  No memory loss.   Vital Signs BP (!) 148/80 (BP Location: Left Arm, Cuff Size: Normal)   Pulse (!) 101   Ht 6' (1.829 m)   Wt 178 lb 12.8 oz (81.1 kg)   SpO2 98%   BMI 24.25 kg/m    Physical Exam:  General- No distress,  A&Ox3, pleasant ENT: No  sinus tenderness, TM clear, pale nasal mucosa, no oral exudate,no post nasal drip, no LAN Cardiac: S1, S2, regular rate and rhythm, no murmur Chest: + wheeze/ rales/ no dullness; no accessory muscle use, no nasal flaring, no sternal retractions, decreased per bases bilaterally Abd.: Soft Non-tender, ND, Body mass index is 24.25 kg/m. 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 Prednisone taper; 10 mg tablets: 4 tabs x 2 days, 3 tabs x 2 days, 2 tabs x 2 days 1 tab x 2 days then stop. Levaquin 750  mg x 5 days. Take Probiotic with antibiotic. Sputum for Culture, AFB and Fungus now before antibiotic started.. Mucinex 1200 mg once daily. Hydromet cough syrup 5 cc's at bedtime. Don't drive if sleepy. Continue wearing oxygen at 2 L [place] at bedtime. Saturation goals are 88-92% Follow up CT scan 02/2018 Work on quitting smoking. We recommend wearing your nocturnal oxygen for your night time desaturations. Follow up in 2 weeks with Dr. Marchelle Gearing or Maralyn Sago NP Please contact office for sooner follow up if symptoms do not improve or worsen or seek emergency care     .   Cigarette smoker Continues to smoke daily despite symptoms Discussed risks or continued tobacco abuse Not taking Wellbutrin Dr. Marchelle Gearing prescribed. States sie effects prevented use. Did not call the office to ask for alternative smoking cessation assistance Plan: I have spent 3 minutes counseling patient on smoking cessation this visit. Counseled not to wear oxygen while smoking>> risks reviewed Follow up LDCT due 02/2018 for Lung RADS 3  Lung  RADS 3, nodules that are probably benign findings, short term follow up suggested: includes nodules with a  low likelihood of becoming a clinically active cancer. Radiology recommends a 6 month repeat LDCT follow up.  Nocturnal hypoxemia Non-compliant with oxygen States it is too  uncomfortable to use Plan Counseled to use nocturnal oxygen.    Bevelyn Ngo, NP 01/28/2018  6:50 PM

## 2018-02-02 ENCOUNTER — Other Ambulatory Visit: Payer: Self-pay | Admitting: Acute Care

## 2018-02-02 DIAGNOSIS — J449 Chronic obstructive pulmonary disease, unspecified: Secondary | ICD-10-CM

## 2018-02-02 MED FILL — ADVAIR 500/50 DISKUS: 500-50 | 30 days supply | Qty: 60 | Fill #3

## 2018-02-04 DIAGNOSIS — J441 Chronic obstructive pulmonary disease with (acute) exacerbation: Secondary | ICD-10-CM | POA: Diagnosis not present

## 2018-02-04 DIAGNOSIS — J449 Chronic obstructive pulmonary disease, unspecified: Secondary | ICD-10-CM | POA: Diagnosis not present

## 2018-02-04 DIAGNOSIS — R0902 Hypoxemia: Secondary | ICD-10-CM | POA: Diagnosis not present

## 2018-02-11 ENCOUNTER — Telehealth: Payer: Self-pay | Admitting: Internal Medicine

## 2018-02-11 NOTE — Telephone Encounter (Signed)
Late Entry  .[email or web address]/ICON  Title: Blood Sample Collection in Subjects with Pulmonary Nodules or CT Suspicion of Lung  Cancer  Study Number: [id number]; Protocol: Version 5.0,  Date: [id number]  Sponsor: [employer] [address] [place] [address]  Principal Investigator: Dr. Chilton Greathouse ; Sub Investigators: Dr. Kalman Shan, Dr. Max Fickle  Synopsis: This is multi-site, sample collection study.The study is to obtain de-identified, clinically characterized, whole blood specimens for use in assessing new biomarkers for the detection of neoplasms off the lung.  The study will sample blood (30mL) from approximately 2250 subject; 1000 will have CT suspicion of lung cancer which is ultimately diagnosed as lung cancer and approximately 1000 subjects will have pulmonary nodules greater than or equal to 4 mm .   Clinical Research Coordinator note : This follow up chart review for Subject Derek Conner with DOB: [birth year] on 01/30/2018  for the above protocol is Visit/Encounter 6 month follow up and is for purpose of research. The consent for this encounter is under Protocol Version 5.0 and  is currently IRB approved.   As per required by the above mentioned protocol the subject was due for their 6 month follow up assessment of present baseline lung nodules. This subject's chart has been reviewed and data collected was for the purpose of the study. For additional information on the subject encounter please refer to the subjects paper source binder.    Signed by  [employer] BS. CCRC I  Clinical Research Coordinator I [place], [place] 11:53 AM 02/11/2018

## 2018-02-12 NOTE — Telephone Encounter (Signed)
Attempted phone call

## 2018-02-14 NOTE — Progress Notes (Signed)
History of Present Illness Derek Conner is a 57 y.o. male with GOLD III copd as of 10/25/16 PFT, with frequent exacerbations requiring antibiotic and prednisone tapers, and nocturnal desaturations ( nocturnal oxygen prescribed, but not worn)  . He is followed by Dr. Marchelle Gearing  02/16/2018  Pt presents for follow up. He was last seen 01/28/2018 for slow to resolve COPD exacerbation. He had been prescribed doxy and pred taper by Dr. Marchelle Gearing 6/11 which had not resolved 6/26. We retreated him with Levaquin and pred taper on 6/26. Additionally I ordered a sputum culture for culture, AFB and Fungus. For some reason, only the AFB was cultured, and it was initially  negative . He presents today for follow up. He states he is compliant with his Spiriva, Advair and Daliresp. He states he has been using his Mucinex and Hydromet as needed. He continues to smoke. He was prescribed Wellbutrin, which he stopped after a few days as it made him feel bad.  He states he has been much better. He does have a lingering cough and some congestion, but he states he is much better. Secretions are light to clear. He is having to use his rescue inhaler once or twice daily, which is a big improvement. He is using his nebs once every other day. He states the heat and humidity have been making his breathing worse.He denies fever, chest pain, orthopnea and hemoptysis.   CT Chest due 03/22/2018   Test Results: CXR 02/16/2018>> COPD. Stable nodular densities in the upper lobes. No active disease.  CXR 01/28/2018>> IMPRESSION: COPD. Nodular opacities in the lung apices secondary to chronic atypical infections, unchanged.  CXR  12/18/2017: Improvement in parenchymal infiltrate within the right upper lobe when compared to the CT scan of 08/28/2017. 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 Emphysema.  Recent  bilateral hip replacements 09/2017 and 11/2017  CBC Latest Ref Rng & Units 01/08/2018 11/13/2017 10/29/2017  WBC 4.0 - 10.5 K/uL 9.0 8.3 10.4  Hemoglobin 13.0 - 17.0 g/dL 09.3 12.6(L) 12.7(L)  Hematocrit 39.0 - 52.0 % 43.6 40.0 38.8(L)  Platelets 150 - 400 K/uL 455(H) 547(H) 470.0(H)    BMP Latest Ref Rng & Units 01/08/2018 11/13/2017 10/29/2017  Glucose 65 - 99 mg/dL 267(T) 245(Y) 099(I)  BUN 6 - 20 mg/dL 15 11 16   Creatinine 0.61 - 1.24 mg/dL 3.38(S) 5.05 3.97  Sodium 135 - 145 mmol/L 139 136 138  Potassium 3.5 - 5.1 mmol/L 4.0 4.2 5.2(H)  Chloride 101 - 111 mmol/L 103 103 101  CO2 22 - 32 mmol/L 26 22 28   Calcium 8.9 - 10.3 mg/dL 67.3 9.4 41.9    BNP    Component Value Date/Time   BNP 17.9 05/16/2016 1523    ProBNP No results found for: PROBNP  PFT    Component Value Date/Time   FEV1PRE 1.38 10/25/2016 1028   FEV1POST 1.10 02/15/2014 1202   FVCPRE 3.24 10/25/2016 1028   FVCPOST 3.10 02/15/2014 1202   TLC 9.36 02/15/2014 1202   DLCOUNC 24.75 10/25/2016 1028   PREFEV1FVCRT 43 10/25/2016 1028   PSTFEV1FVCRT 36 02/15/2014 1202    Dg Chest 2 View  Result Date: 02/16/2018 CLINICAL DATA:  COPD. EXAM: CHEST - 2 VIEW COMPARISON:  01/28/2018 FINDINGS: There is hyperinflation of the lungs compatible with COPD. Nodular densities again noted in the upper lobes, stable. Heart is normal size. No effusions. No acute bony  abnormality. IMPRESSION: COPD. Stable nodular densities in the upper lobes. No active disease. Electronically Signed   By: Charlett Nose M.D.   On: 02/16/2018 10:09   Dg Chest 2 View  Result Date: 01/28/2018 CLINICAL DATA:  Productive cough and shortness of breath EXAM: CHEST - 2 VIEW COMPARISON:  Chest radiograph 12/18/2017 FINDINGS: The lungs are hyperinflated. The cardiomediastinal contours are normal. There is diffuse interstitial coarsening. There are multiple nodular opacities at the lung apices, unchanged. No focal consolidation or pulmonary edema. There is no pleural  effusion or pneumothorax. IMPRESSION: COPD. Nodular opacities in the lung apices secondary to chronic atypical infections, unchanged. Electronically Signed   By: Deatra Robinson M.D.   On: 01/28/2018 16:05     Past medical hx Past Medical History:  Diagnosis Date  . Asthma   . CAP (community acquired pneumonia) 01/19/2016  . Cerebral aneurysm   . Chronic bronchitis (HCC)   . COPD (chronic obstructive pulmonary disease) (HCC)   . Emphysema lung (HCC)   . Emphysema of lung (HCC)   . GERD (gastroesophageal reflux disease)   . Hepatitis A infection ~ 1980  . High cholesterol   . Hypertension   . Pneumonia 1960s  . Rheumatoid arthritis (HCC)    "hands mostly"  ( 01/19/2016)  . Situational depression 25-Nov-2009   "only when my mother died"  . Stroke [facility])    mini stroke when doing embolization, Nov 26, 2015, no deficits     Social History   Tobacco Use  . Smoking status: Current Every Day Smoker    Packs/day: 1.00    Years: 40.00    Pack years: 40.00    Types: Cigarettes  . Smokeless tobacco: Never Used  . Tobacco comment: 12/18/17: 5 cigs per day  Substance Use Topics  . Alcohol use: Yes    Alcohol/week: 1.2 - 1.8 oz    Types: 2 - 3 Shots of liquor per week    Comment: 3 drinks liquor per day  . Drug use: Yes    Types: Marijuana    Comment: 1-2 times per week    Mr.Winebarger reports that he has been smoking cigarettes.  He has a 40.00 pack-year smoking history. He has never used smokeless tobacco. He reports that he drinks about 1.2 - 1.8 oz of alcohol per week. He reports that he has current or past drug history. Drug: Marijuana.  Tobacco Cessation: I have spent 5 minutes counseling patient on smoking cessation this visit. He verbalized understanding of the fact that smoking increases his risk of lung cancer, pulmonary disease, stroke and heart disease.  Past surgical hx, Family hx, Social hx all reviewed.  Current Outpatient Medications on File Prior to Visit  Medication Sig  .  acetaminophen (TYLENOL) 500 MG tablet Take 1,000 mg by mouth daily as needed for moderate pain or headache.  . ADVAIR DISKUS 500-50 MCG/DOSE AEPB INHALE 1 PUFF BY MOUTH INTO THE LUNGS 2 (TWO) TIMES DAILY.  Marland Kitchen albuterol (PROVENTIL HFA;VENTOLIN HFA) 108 (90 Base) MCG/ACT inhaler Inhale 2 puffs into the lungs every 4 (four) hours as needed for wheezing or shortness of breath.  Marland Kitchen albuterol (PROVENTIL) (2.5 MG/3ML) 0.083% nebulizer solution Take 3 mLs (2.5 mg total) by nebulization every 6 (six) hours as needed for wheezing or shortness of breath. DX: J44.9  . amLODipine (NORVASC) 10 MG tablet TAKE 1 TABLET (10 MG) BY MOUTH DAILY. (Patient taking differently: TAKE 1 TABLET (10 MG) BY MOUTH DAILY AT NIGHT)  . b complex vitamins tablet Take  1 tablet by mouth daily.  . cloNIDine (CATAPRES) 0.2 MG tablet Take 1 tablet (0.2 mg total) by mouth 2 (two) times daily.  Marland Kitchen DALIRESP 500 MCG TABS tablet TAKE 1 TABLET (500 MCG TOTAL) BY MOUTH DAILY.  . famotidine (PEPCID) 20 MG tablet TAKE 1 TABLET (20 MG TOTAL) BY MOUTH AT BEDTIME.  . fenofibrate 54 MG tablet TAKE 1 TABLET (54 MG TOTAL) BY MOUTH DAILY.  Marland Kitchen OXYGEN Inhale 2 L into the lungs at bedtime.  . pantoprazole (PROTONIX) 40 MG tablet TAKE 1 TABLET (40 MG TOTAL) BY MOUTH DAILY AT 12:00 NOON.  Marland Kitchen tiotropium (SPIRIVA HANDIHALER) 18 MCG inhalation capsule PLACE 1 CAPSULE INTO INHALER AND INHALE INTO LUNGS DAILY.   Current Facility-Administered Medications on File Prior to Visit  Medication  . 0.9 %  sodium chloride infusion  . aspirin EC tablet 325 mg  . niMODipine (NIMOTOP) capsule 0-60 mg     Allergies  Allergen Reactions  . Symbicort [Budesonide-Formoterol Fumarate] Shortness Of Breath, Nausea And Vomiting and Other (See Comments)    heachache  . Dulera [Mometasone Furo-Formoterol Fum] Other (See Comments)    Headache   . Humira [Adalimumab] Nausea And Vomiting    Headaches     Review Of Systems:  Constitutional:   No  weight loss, night sweats,   Fevers, chills, + fatigue, or  lassitude.  HEENT:   No headaches,  Difficulty swallowing,  Tooth/dental problems, or  Sore throat,                No sneezing, itching, ear ache, nasal congestion, post nasal drip,   CV:  No chest pain,  Orthopnea, PND, swelling in lower extremities, anasarca, dizziness, palpitations, syncope.   GI  No heartburn, indigestion, abdominal pain, nausea, vomiting, diarrhea, change in bowel habits, loss of appetite, bloody stools.   Resp: + baseline  shortness of breath with exertion or at rest.  + baseline  excess mucus, + baseline  productive cough,  No non-productive cough,  No coughing up of blood.  No change in color of mucus.  + wheezing.  No chest wall deformity  Skin: no rash or lesions.  GU: no dysuria, change in color of urine, no urgency or frequency.  No flank pain, no hematuria   MS:  No joint pain or swelling.  No decreased range of motion.  No back pain.  Psych:  No change in mood or affect. No depression or anxiety.  No memory loss.   Vital Signs BP 114/74 (BP Location: Left Arm, Cuff Size: Normal)   Pulse 70   Ht 6' (1.829 m)   Wt 178 lb 12.8 oz (81.1 kg)   SpO2 95%   BMI 24.25 kg/m    Physical Exam:  General- No distress,  A&Ox3, pleasant ENT: No sinus tenderness, TM clear, pale nasal mucosa, no oral exudate,no post nasal drip, no LAN Cardiac: S1, S2, regular rate and rhythm, no murmur Chest: + wheeze/No  rales/ dullness; no accessory muscle use, no nasal flaring, no sternal retractions Abd.: Soft Non-tender, ND, BS + Ext: No clubbing cyanosis, edema Neuro:  normal strength, MAE x 4, A&O x 3, appropriiate Skin: No rashes, warm and dry Psych: normal mood and behavior   Assessment/Plan  COPD exacerbation (HCC) Resolved after treatment with Levaquin Sputum sent for AFB last OV negative  Culture and fungal ordered not done Plan: Continue Advair, Spiriva and Daliresp. Rinse mouth after use. Add Mucinex with full glass of water  for secretion clearance Start  using Flutter valve  Use 4 time a day, try 10 puffs at a time Clean Flutter valve with soapay water once weekly Please work on quitting smoking. Consider wearing oxygen at night as prescribed. We will give you a specimen cup for sputum culture if you are antibiotic free x 6 weeks. Follow up in 3 months with Maralyn Sago NP or Dr. Marchelle Gearing Note your daily symptoms > remember "red flags" for COPD:  Increase in cough, increase in sputum production, increase in shortness of breath or activity intolerance. If you notice these symptoms, please call to be seen.   Please contact office for sooner follow up if symptoms do not improve or worsen or seek emergency care       Nocturnal hypoxemia Non-compliant with nocturnal oxygen Plan: Wear nocturnal oxygen at 2 L [place]  Cigarette smoker Continues to smoke despite numberous attempts to counsel Plan: I have spent 5 minutes counseling patient on smoking cessation this visit. He verbalized understanding of the fact that smoking increases his risk of lung cancer, pulmonary disease, stroke and heart disease. Advised not to smoke while wearing oxygen  Low Dose CT Lung Cancer Screening Plan: Schedule for follow up scan 03/20/2018 Lung RADS 3  Lung  RADS 3, nodules that are probably benign findings, short term follow up suggested: includes nodules with a low likelihood of becoming a clinically active cancer. Radiology recommends a 6 month repeat LDCT follow up.   Bevelyn Ngo, NP 02/16/2018  4:25 PM

## 2018-02-16 ENCOUNTER — Encounter: Payer: Self-pay | Admitting: Acute Care

## 2018-02-16 ENCOUNTER — Ambulatory Visit (INDEPENDENT_AMBULATORY_CARE_PROVIDER_SITE_OTHER)
Admission: RE | Admit: 2018-02-16 | Discharge: 2018-02-16 | Disposition: A | Discharge status: Home / Self Care | Payer: 59 | Source: Ambulatory Visit | Attending: Acute Care | Admitting: Acute Care

## 2018-02-16 ENCOUNTER — Ambulatory Visit (INDEPENDENT_AMBULATORY_CARE_PROVIDER_SITE_OTHER): Payer: 59 | Admitting: Acute Care

## 2018-02-16 DIAGNOSIS — J441 Chronic obstructive pulmonary disease with (acute) exacerbation: Secondary | ICD-10-CM | POA: Diagnosis not present

## 2018-02-16 DIAGNOSIS — F17210 Nicotine dependence, cigarettes, uncomplicated: Secondary | ICD-10-CM

## 2018-02-16 DIAGNOSIS — J449 Chronic obstructive pulmonary disease, unspecified: Secondary | ICD-10-CM

## 2018-02-16 DIAGNOSIS — G4734 Idiopathic sleep related nonobstructive alveolar hypoventilation: Secondary | ICD-10-CM

## 2018-02-16 MED FILL — DALIRESP 500 MCG TABLET: 500 | 30 days supply | Qty: 30 | Fill #4

## 2018-02-16 NOTE — Patient Instructions (Addendum)
Continue Advair, Spiriva and Daliresp. Rinse mouth after use. Add Mucinex with full glass of water for secretion clearance Start using Flutter valve  Use 4 time a day, try 10 puffs at a time Clean Flutter valve with soapay water once weekly Please work on quitting smoking. Consider wearing oxygen at night as prescribed. We will give you a specimen cup for sputum culture if you are antibiotic free x 6 weeks. Follow up in 3 months with Maralyn Sago NP or Dr. Marchelle Gearing Note your daily symptoms > remember "red flags" for COPD:  Increase in cough, increase in sputum production, increase in shortness of breath or activity intolerance. If you notice these symptoms, please call to be seen.   Please contact office for sooner follow up if symptoms do not improve or worsen or seek emergency care

## 2018-02-16 NOTE — Assessment & Plan Note (Addendum)
Continues to smoke despite numberous attempts to counsel Plan: I have spent 5 minutes counseling patient on smoking cessation this visit. He verbalized understanding of the fact that smoking increases his risk of lung cancer, pulmonary disease, stroke and heart disease. Advised not to smoke while wearing oxygen

## 2018-02-16 NOTE — Assessment & Plan Note (Signed)
Non-compliant with nocturnal oxygen Plan: Wear nocturnal oxygen at 2 L [place]

## 2018-02-16 NOTE — Assessment & Plan Note (Signed)
Resolved after treatment with Levaquin Sputum sent for AFB last OV negative  Culture and fungal ordered not done Plan: Continue Advair, Spiriva and Daliresp. Rinse mouth after use. Add Mucinex with full glass of water for secretion clearance Start using Flutter valve  Use 4 time a day, try 10 puffs at a time Clean Flutter valve with soapay water once weekly Please work on quitting smoking. Consider wearing oxygen at night as prescribed. We will give you a specimen cup for sputum culture if you are antibiotic free x 6 weeks. Follow up in 3 months with Derek Sago NP or Derek Conner Note your daily symptoms > remember "red flags" for COPD:  Increase in cough, increase in sputum production, increase in shortness of breath or activity intolerance. If you notice these symptoms, please call to be seen.   Please contact office for sooner follow up if symptoms do not improve or worsen or seek emergency care

## 2018-02-18 NOTE — Telephone Encounter (Signed)
PI OVERSIGHT ATTESTATION  I the Sub-Principal Investigator  for the above mentioned study attest that I reviewed the above mentioned  clinical research coordinato notes on research subject  Derek Conner  born [birth year] . I  agree with the findings mentioned above   Dr. Kalman Shan, M.D., F.C.C.P., ACRP-CPI Pulmonary and Critical Care Medicine Principal Investigator & Staff Physician [facility] and [facility]  [place] [facility]monary and Critical [facility] Pager: [phone], If no answer or between  15:00h - 7:00h: call [phone]  02/18/2018 5:49 AM

## 2018-02-26 ENCOUNTER — Other Ambulatory Visit: Payer: Self-pay | Admitting: Internal Medicine

## 2018-02-26 MED FILL — ALBUTEROL 0.083% INHAL SOLN: (2.5 MG/3ML | 6 days supply | Qty: 75 | Fill #6

## 2018-02-27 MED FILL — ADVAIR 500/50 DISKUS: 500-50 | 30 days supply | Qty: 60 | Fill #0

## 2018-03-03 ENCOUNTER — Telehealth: Payer: Self-pay | Admitting: Acute Care

## 2018-03-03 MED ORDER — PREDNISONE 10 MG PO TABS: ORAL_TABLET | ORAL | 0 refills | Status: DC

## 2018-03-03 MED ORDER — DOXYCYCLINE HYCLATE 100 MG PO TABS: 100.0000 mg | ORAL_TABLET | Freq: Two times a day (BID) | ORAL | 0 refills | Status: DC

## 2018-03-03 MED FILL — DOXYCYCLINE HYCLATE 100 MG: 100 | 5 days supply | Qty: 10 | Fill #0

## 2018-03-03 MED FILL — predniSONE 10 MG TABS: 10 | 8 days supply | Qty: 15 | Fill #0

## 2018-03-03 NOTE — Telephone Encounter (Signed)
Take prednisone 40 mg daily x 2 days, then 20mg  daily x 2 days, then 10mg  daily x 2 days, then 5mg  daily x 2 days and stop  Take doxycycline 100mg  po twice daily x 5 days; take after meals and avoid sunlight   Dr. , M.D., [facility].C.P Pulmonary and Critical Care Medicine Staff Physician, [facility] Center Director - Interstitial Lung Disease  Program  Pulmonary Fibrosis [facility] at [facility] [place], [facility], [facility]  Pager: [phone], If no answer or between  15:00h - 7:00h: call [phone] Telephone: [phone]

## 2018-03-03 NOTE — Telephone Encounter (Signed)
Spoke with pt. He is aware of MR's recommendations. Rxs have been sent in. Nothing further was needed. 

## 2018-03-03 NOTE — Telephone Encounter (Signed)
Called pt who stated who stated he has had another COPD exacerbation that has been going on x1 week. Pt states he is coughing up foamy mucus that is yellow in color, wheezing, little worsening SOB, and some chest tightness.  Pt declines an appt and wants meds to be sent to pharmacy.  Dr. Marchelle Gearing, please advise on recommendations for pt. Thanks!  Allergies  Allergen Reactions  . Symbicort [Budesonide-Formoterol Fumarate] Shortness Of Breath, Nausea And Vomiting and Other (See Comments)    heachache  . Dulera [Mometasone Furo-Formoterol Fum] Other (See Comments)    Headache   . Humira [Adalimumab] Nausea And Vomiting    Headaches

## 2018-03-04 ENCOUNTER — Other Ambulatory Visit: Payer: Self-pay

## 2018-03-04 NOTE — Progress Notes (Signed)
Pt requesting refill for Fenofibrate lasted filled by Dr. Lorin Picket.   Please advise

## 2018-03-05 MED ORDER — FENOFIBRATE 54 MG PO TABS: 54.0000 mg | ORAL_TABLET | Freq: Every day | ORAL | 2 refills | Status: DC

## 2018-03-05 MED FILL — FENOFIBRATE 54 MG TABLET: 54 | 30 days supply | Qty: 30 | Fill #0

## 2018-03-07 DIAGNOSIS — J441 Chronic obstructive pulmonary disease with (acute) exacerbation: Secondary | ICD-10-CM | POA: Diagnosis not present

## 2018-03-07 DIAGNOSIS — R0902 Hypoxemia: Secondary | ICD-10-CM | POA: Diagnosis not present

## 2018-03-07 DIAGNOSIS — J449 Chronic obstructive pulmonary disease, unspecified: Secondary | ICD-10-CM | POA: Diagnosis not present

## 2018-03-14 IMAGING — MR MR PELVIS W/O CM
4 of 5 series · 28 of 48 positions shown · non-contrast
Comparison: Right hip CT dated May 29, 2017.

CLINICAL DATA: Right hip pain for the past month. History of
rheumatoid arthritis. Evaluate for avascular necrosis.

EXAM:
MRI PELVIS WITHOUT CONTRAST
TECHNIQUE: Multiplanar multisequence MR imaging of the pelvis was performed. No
intravenous contrast was administered.

[Series 4: STIR · coronal · 3.0mm · 1.25mm/px · 11 of 50 slices shown]
[im 1/50]
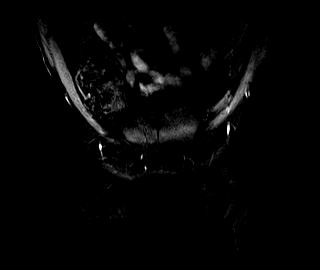
[im 5/50]
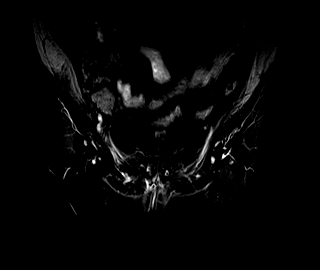
[im 10/50]
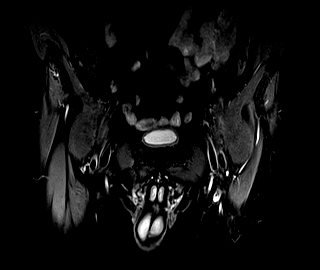
[im 15/50]
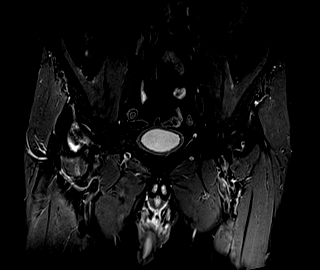
[im 20/50]
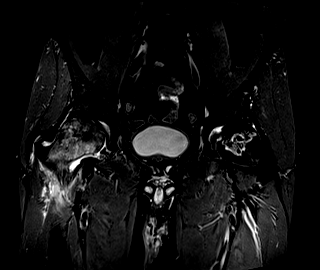
[im 25/50]
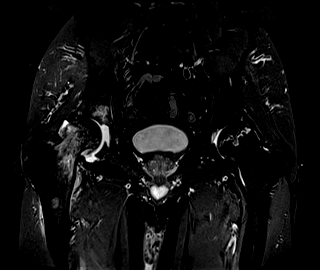
[im 30/50]
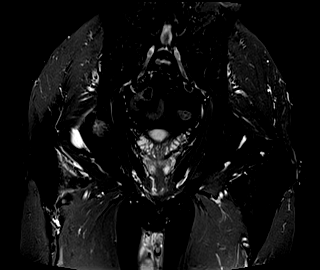
[im 35/50]
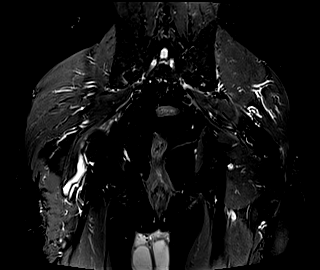
[im 40/50]
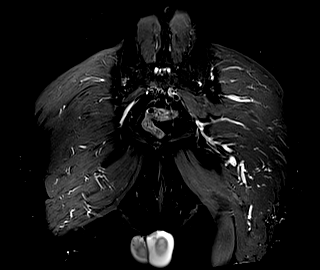
[im 45/50]
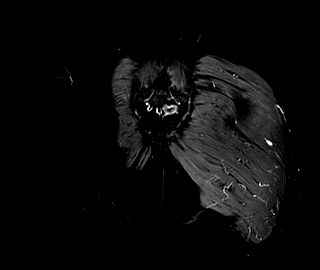
[im 50/50]
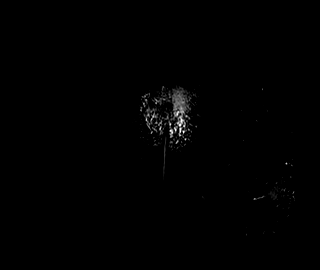

[Series 5: T1 · coronal · 3.0mm · 0.78mm/px · 8 of 50 slices shown (1 of 2)]
[im 1/50]
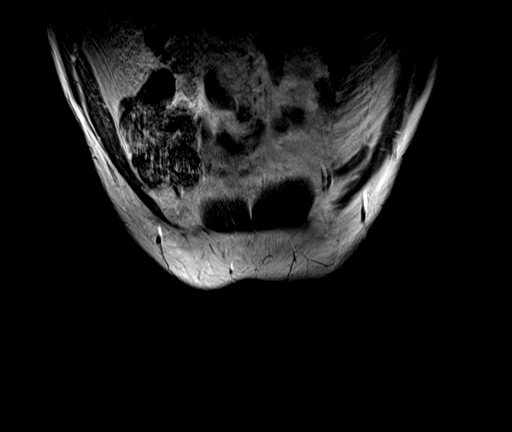
[im 6/50]
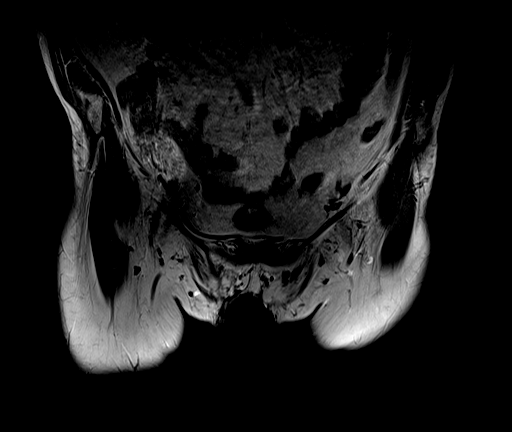
[im 17/50]
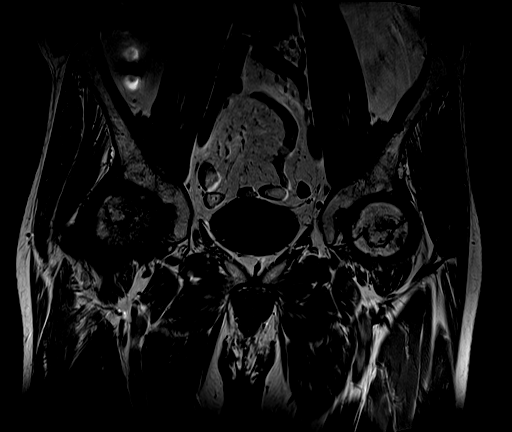
[im 22/50]
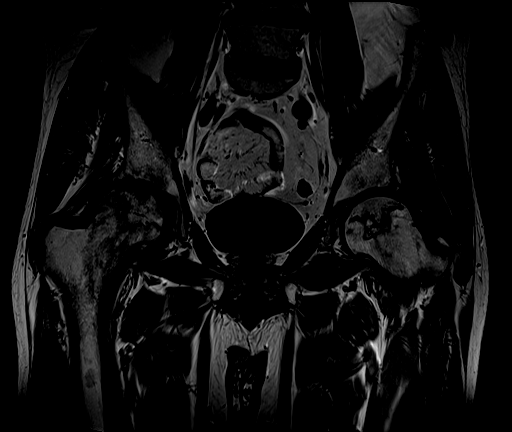
[im 28/50]
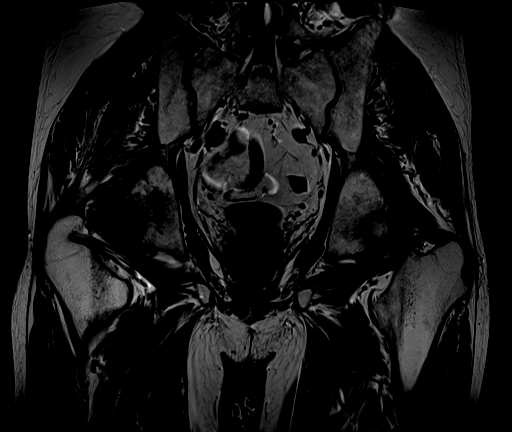
[im 33/50]
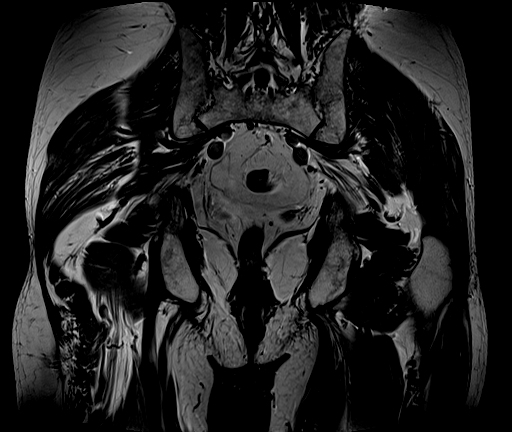
[im 44/50]
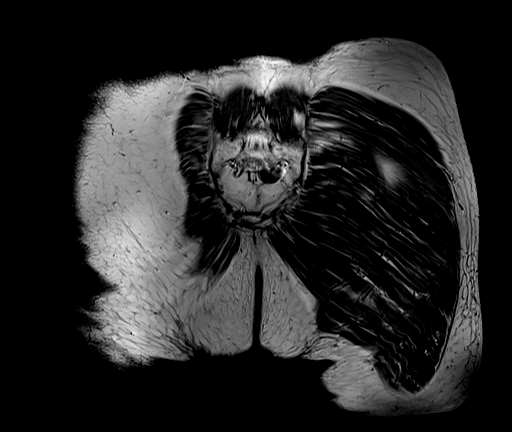
[im 50/50]
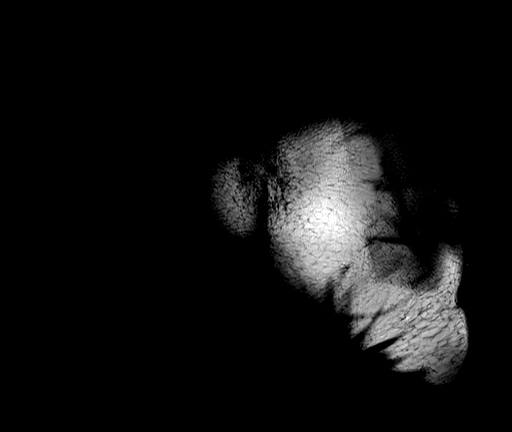

[Series 6: T1 · axial · 5.0mm · 0.78mm/px · z∈[-101,+137]mm · 6 of 41 slices shown (2 of 2)]
[im 1/41]
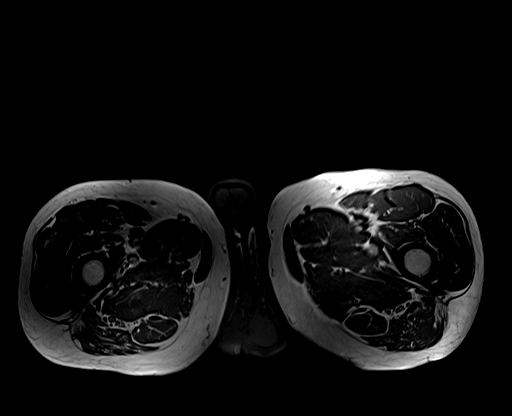
[im 6/41]
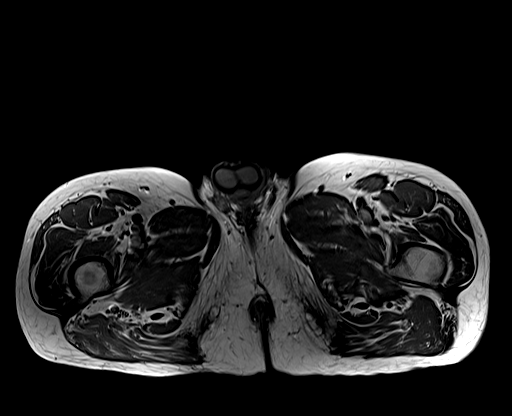
[im 12/41]
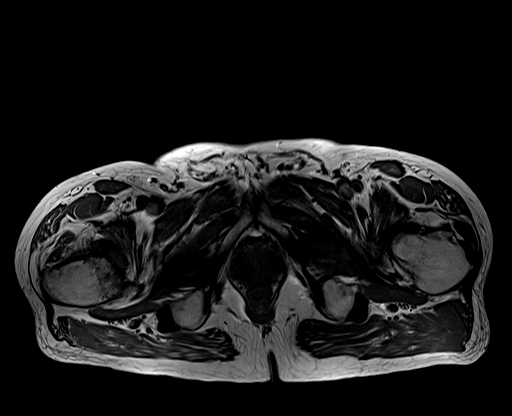
[im 18/41]
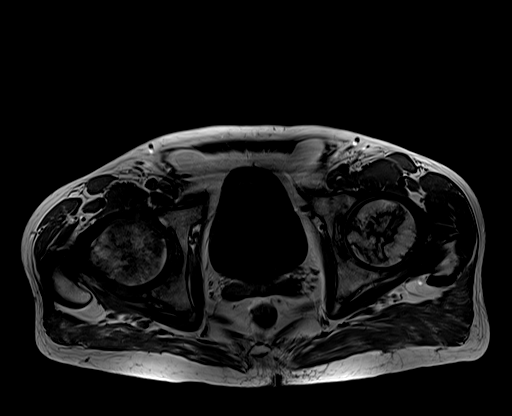
[im 23/41]
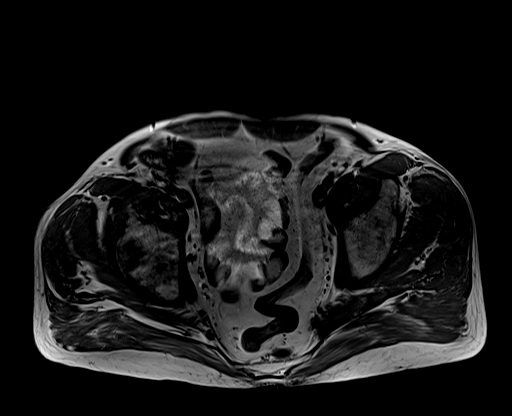
[im 35/41]
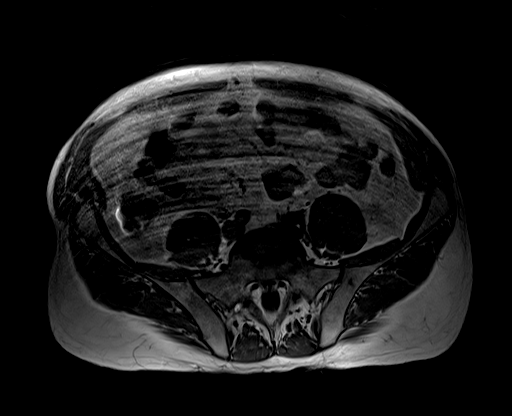

[Series 7: T2 fat-sat · axial · 5.0mm · 0.78mm/px · z∈[-66,+137]mm · 3 of 41 slices shown]
[im 6/41]
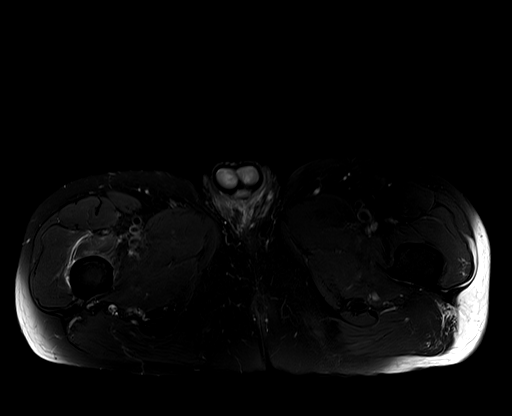
[im 23/41]
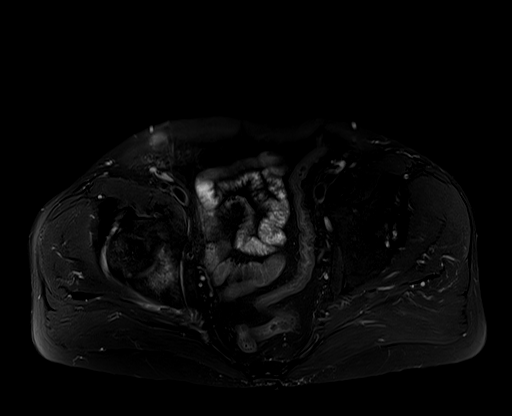
[im 35/41]
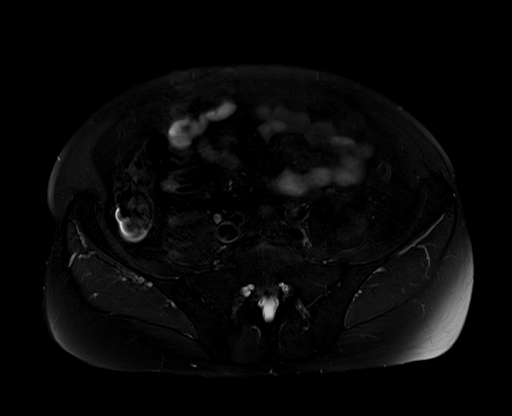

[28 of 48 positions shown; findings below may reference images not displayed]

FINDINGS: Bones: There is serpiginous T2 hyperintense, T1 hypointense signal
in both femoral heads, consistent with avascular necrosis. There is
extensive marrow edema in the right femoral head extending into the
intertrochanteric region. There is slight subchondral collapse of
the superior right femoral head. No fracture. Mild degenerative
marrow edema in the iliac aspect of the right greater than left
sacroiliac joints. The pubic symphysis is normal.

Articular cartilage and labrum

Articular cartilage: There is a full-thickness cartilage loss over
the right posterior acetabulum and femoral head with underlying
degenerative marrow edema in the acetabulum. Full-thickness
cartilage loss over the superior acetabulum with underlying
subchondral cystic change.

Labrum: Degeneration and irregularity of the right anterior superior
labrum. Probable tear of the left superior labrum with adjacent
small paralabral cyst.

Joint or bursal effusion

Joint effusion: Small right hip joint effusion.

Bursae: No focal periarticular fluid collection.

Muscles and tendons

Muscles and tendons: Mild tendinosis and partial tearing of the
right gluteus minimus tendon. The left gluteal tendons are
unremarkable. The visualized bilateral hamstring and iliopsoas
tendons appear normal. The piriformis muscles appear symmetric.

Other findings

Miscellaneous: The visualized internal pelvic contents appear
unremarkable.
IMPRESSION: 1. Bilateral femoral head avascular necrosis, worst on the right
where there is slight subchondral collapse and extensive marrow
edema in the femoral head and neck.
2. Mild to moderate right hip degenerative changes.
3. Small right hip joint effusion, likely reactive. Infection is
thought to be less likely, but correlation for any signs or symptoms
of septic arthritis is recommended.
4. Degenerative tearing of the right anterior superior and left
superior labrum.
5. Right gluteus minimus tendinosis and partial tearing.

## 2018-03-15 LAB — MYCOBACTERIA,CULT W/FLUOROCHROME SMEAR
MICRO NUMBER:: 90764432
SMEAR: NONE SEEN
SPECIMEN QUALITY:: ADEQUATE

## 2018-03-15 IMAGING — CR DG CHEST 2V
2 series · 2 of 2 positions shown · non-contrast
Comparison: 04/24/2017

CLINICAL DATA: Cough, congestion, shortness of Breath

EXAM:
CHEST  2 VIEW

[w chest pa]
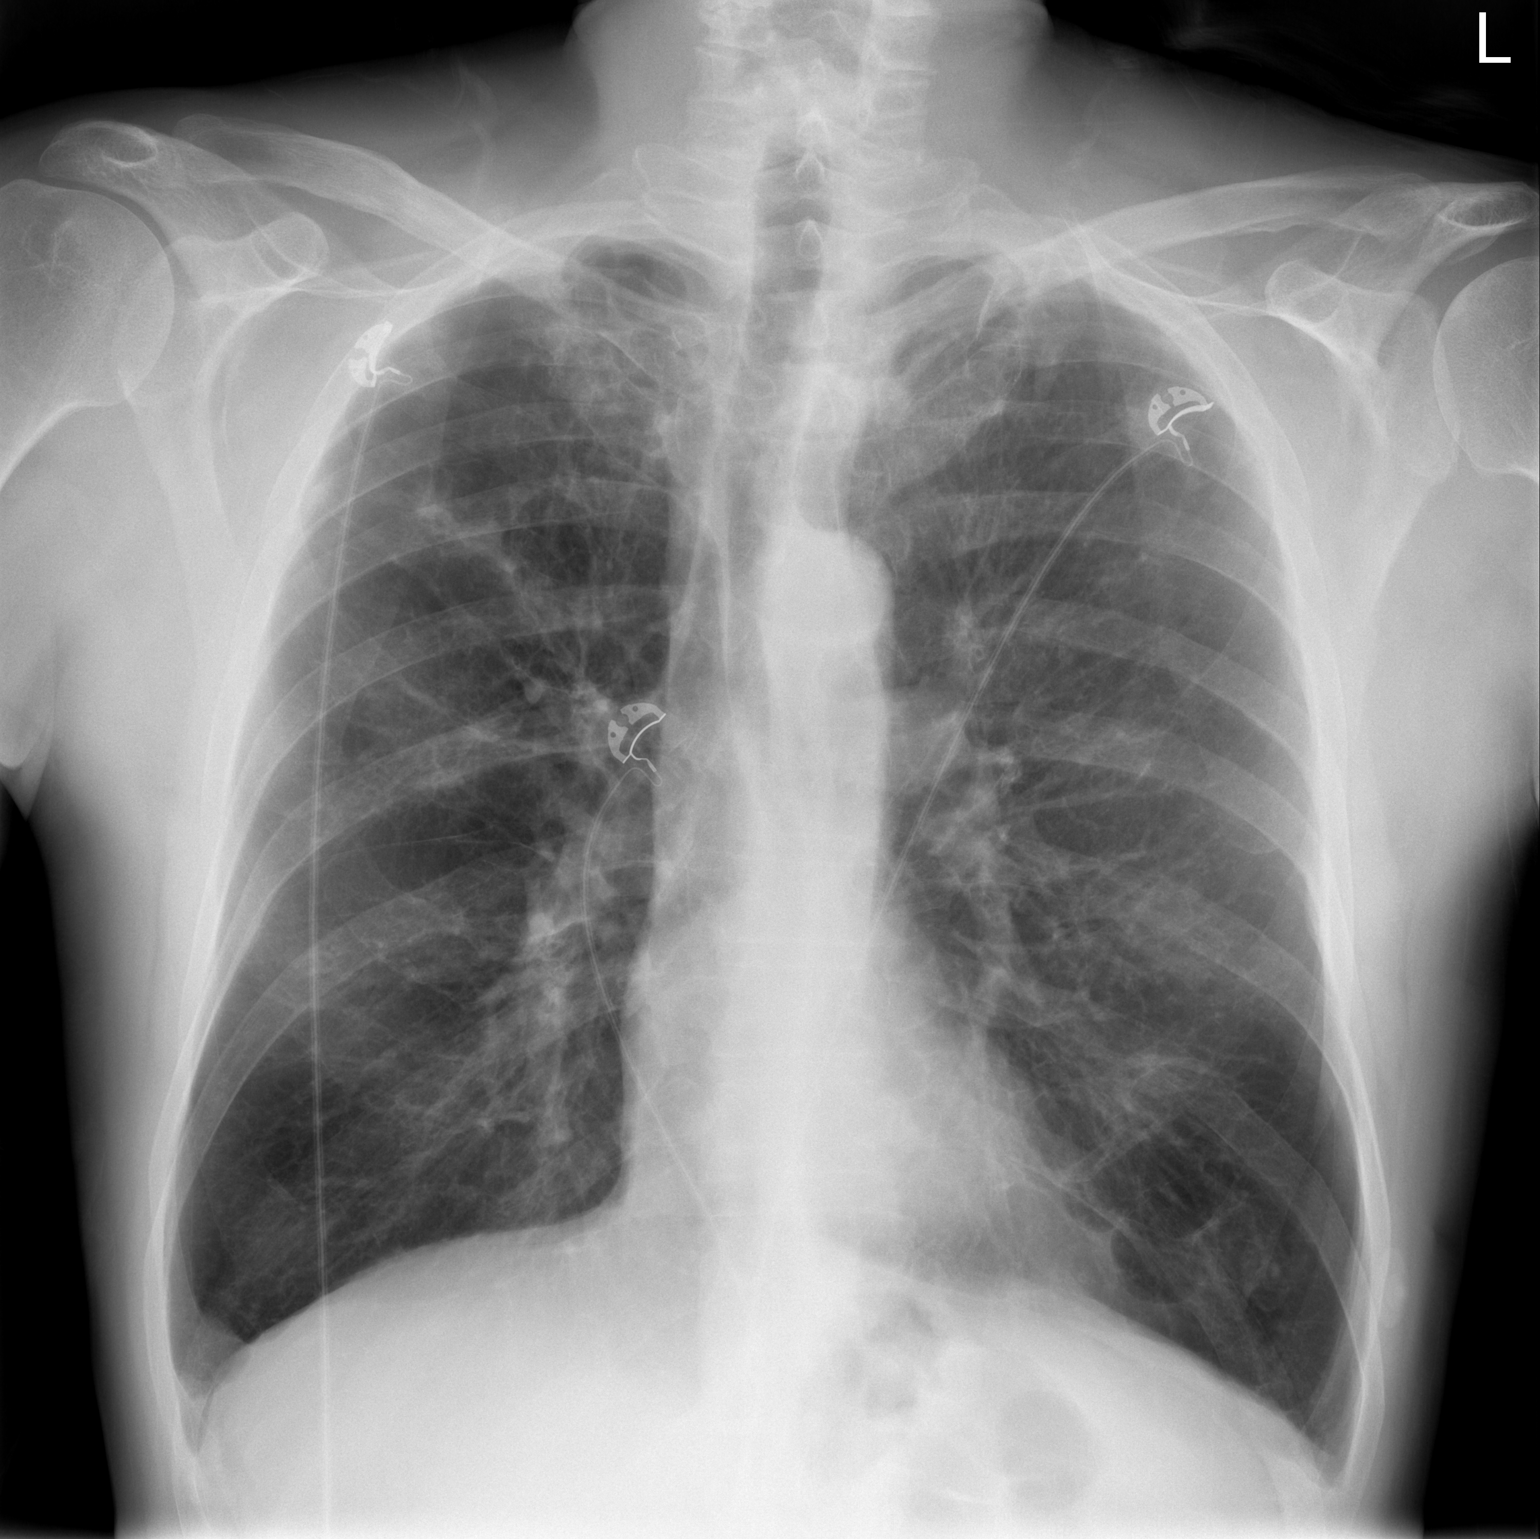

[w chest lat]
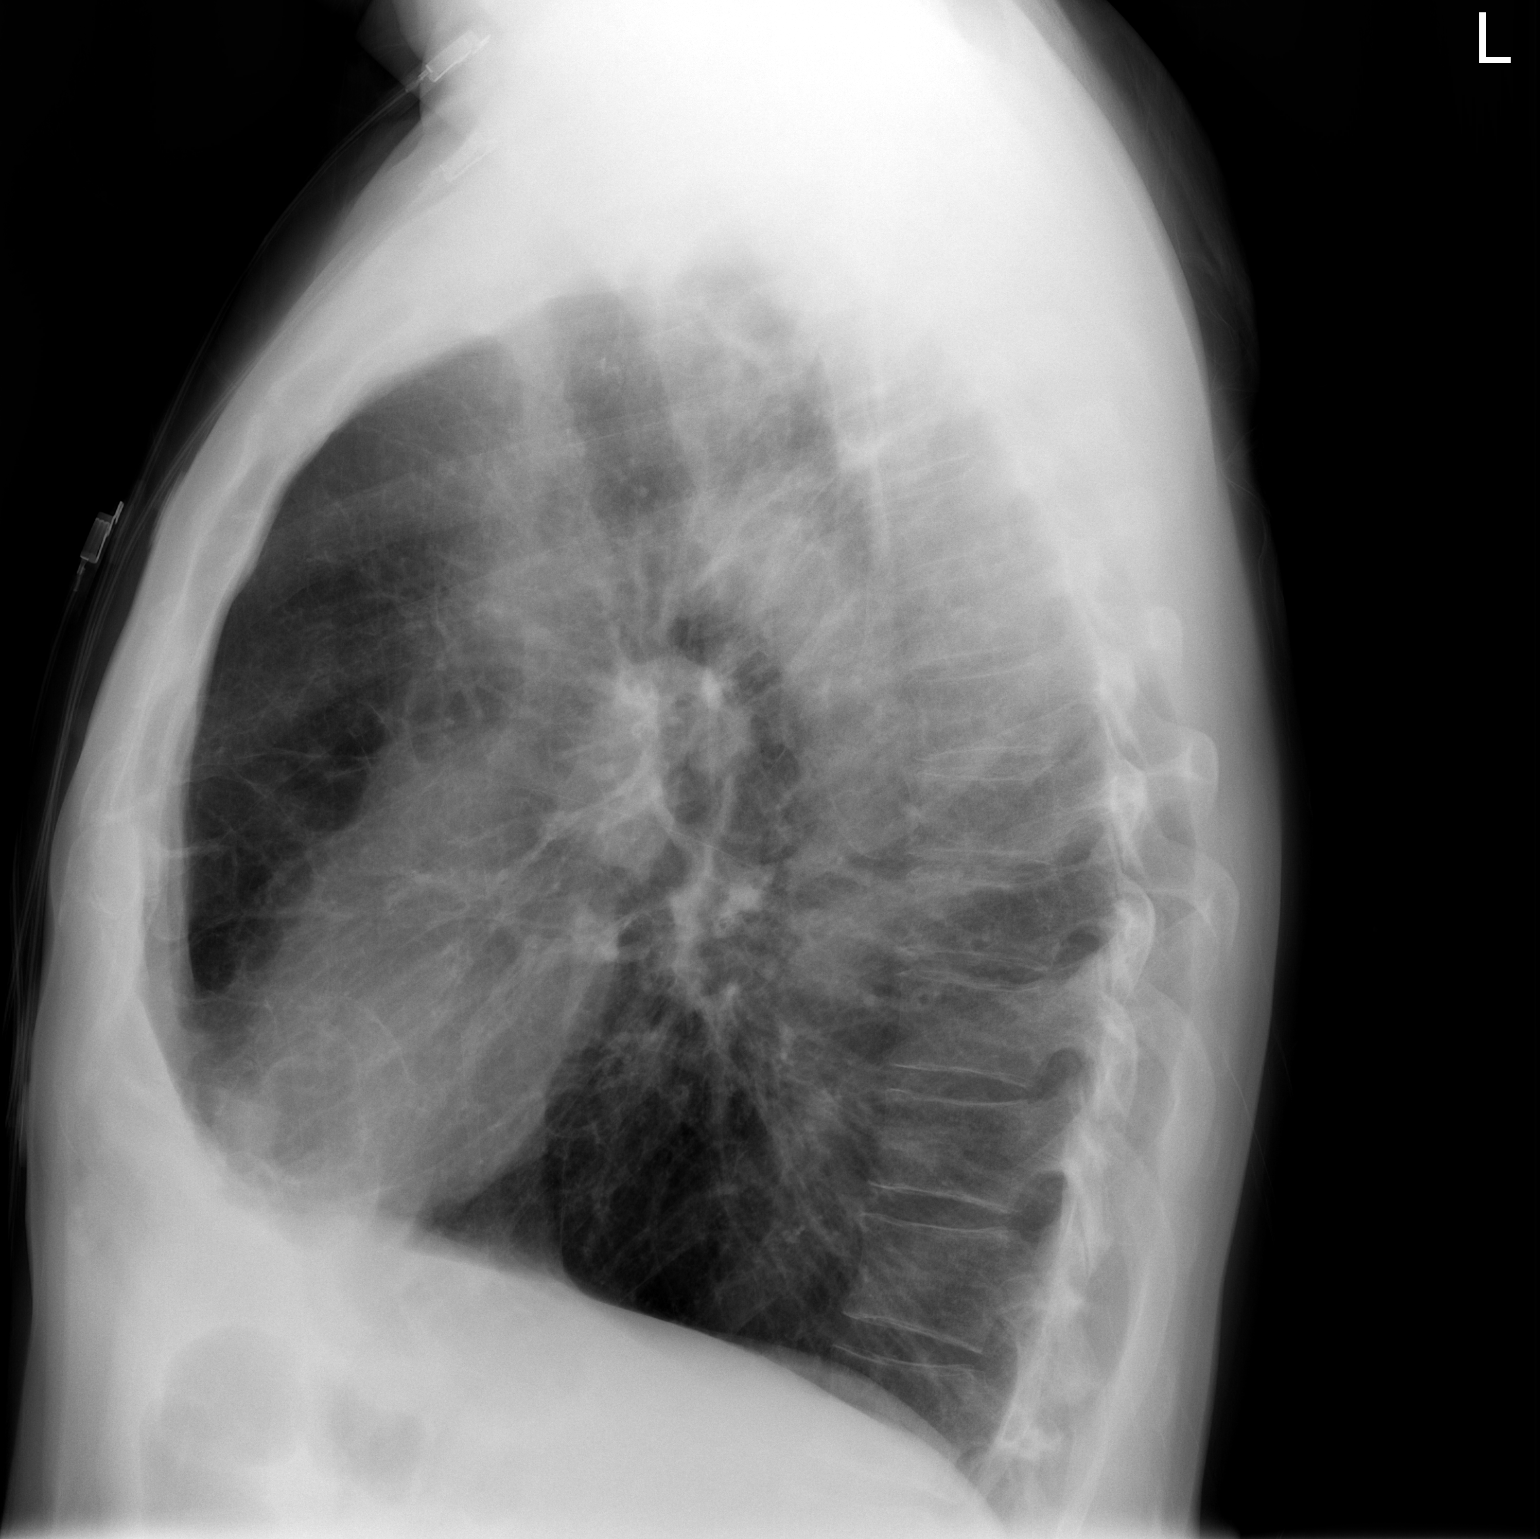

[2 of 2 positions shown; findings below may reference images not displayed]

FINDINGS: There is hyperinflation of the lungs compatible with COPD. Biapical
nodular densities are stable. Heart is normal size. No effusions. No
acute bony abnormality.
IMPRESSION: COPD. Biapical nodular densities, likely scarring. No active
disease.

## 2018-03-18 ENCOUNTER — Other Ambulatory Visit: Payer: Self-pay | Admitting: Medical

## 2018-03-18 DIAGNOSIS — I10 Essential (primary) hypertension: Secondary | ICD-10-CM

## 2018-03-18 MED FILL — DALIRESP 500 MCG TABLET: 500 | 30 days supply | Qty: 30 | Fill #5

## 2018-03-18 NOTE — Telephone Encounter (Signed)
Pt is due for follow up please call and schedule appointment.  

## 2018-03-19 MED FILL — AMLODIPINE BESYLATE 10 MG T: 10 | 90 days supply | Qty: 90 | Fill #0

## 2018-03-19 NOTE — Telephone Encounter (Signed)
Pt scheduled for 03/25/18 @ 1pm

## 2018-03-20 ENCOUNTER — Other Ambulatory Visit (HOSPITAL_BASED_OUTPATIENT_CLINIC_OR_DEPARTMENT_OTHER): Payer: 59

## 2018-03-25 ENCOUNTER — Ambulatory Visit (INDEPENDENT_AMBULATORY_CARE_PROVIDER_SITE_OTHER): Payer: 59 | Admitting: Medical

## 2018-03-25 ENCOUNTER — Encounter: Payer: Self-pay | Admitting: Adult Health

## 2018-03-25 ENCOUNTER — Encounter: Payer: Self-pay | Admitting: Medical

## 2018-03-25 ENCOUNTER — Ambulatory Visit (INDEPENDENT_AMBULATORY_CARE_PROVIDER_SITE_OTHER): Payer: 59 | Admitting: Adult Health

## 2018-03-25 VITALS — BP 152/90 | HR 81 | Temp 98.3°F | Resp 16 | Ht 72.0 in | Wt 179.6 lb

## 2018-03-25 DIAGNOSIS — E785 Hyperlipidemia, unspecified: Secondary | ICD-10-CM

## 2018-03-25 DIAGNOSIS — R739 Hyperglycemia, unspecified: Secondary | ICD-10-CM

## 2018-03-25 DIAGNOSIS — I10 Essential (primary) hypertension: Secondary | ICD-10-CM

## 2018-03-25 DIAGNOSIS — F17200 Nicotine dependence, unspecified, uncomplicated: Secondary | ICD-10-CM

## 2018-03-25 DIAGNOSIS — F17210 Nicotine dependence, cigarettes, uncomplicated: Secondary | ICD-10-CM | POA: Diagnosis not present

## 2018-03-25 DIAGNOSIS — J40 Bronchitis, not specified as acute or chronic: Secondary | ICD-10-CM | POA: Diagnosis not present

## 2018-03-25 DIAGNOSIS — J441 Chronic obstructive pulmonary disease with (acute) exacerbation: Secondary | ICD-10-CM | POA: Diagnosis not present

## 2018-03-25 DIAGNOSIS — I671 Cerebral aneurysm, nonruptured: Secondary | ICD-10-CM

## 2018-03-25 MED ORDER — AMLODIPINE BESYLATE 10 MG PO TABS: ORAL_TABLET | ORAL | 3 refills | Status: AC

## 2018-03-25 MED ORDER — DOXYCYCLINE HYCLATE 100 MG PO TABS: 100.0000 mg | ORAL_TABLET | Freq: Two times a day (BID) | ORAL | 0 refills | Status: DC

## 2018-03-25 MED ORDER — PREDNISONE 10 MG PO TABS: ORAL_TABLET | ORAL | 0 refills | Status: DC

## 2018-03-25 MED FILL — predniSONE 10 MG TABS: 10 | 8 days supply | Qty: 20 | Fill #0

## 2018-03-25 MED FILL — DOXYCYCLINE HYCLATE 100 MG: 100 | 7 days supply | Qty: 14 | Fill #0

## 2018-03-25 NOTE — Patient Instructions (Addendum)
  Your blood pressure is moderately elevated today.  Was 152/91 my nurse checked and when I checked it was actually 160/90.  Blood pressure was better at pulmonologist office.  You have not been on amlodipine over the last 10 days and I did refill that today.  Advised not to use any nonsteroid anti-inflammatory drugs such as Aleve or ibuprofen as that can increase your blood pressure.  I want you to check your blood pressure daily over the next week and update me on those readings.  Need to see if your blood pressure is less than 140/90.  You do have new onset headache over the last 2 weeks and this might be blood pressure related.  You do not have a history of chronic headaches in the past.  You have very good/normal neurologic exam today.  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  So following your blood pressure and knowing that you are headaches are improved/resolved is very important.  If not then would consider getting imaging studies or trying to get very quick referral back to your specialist.  If you have any gross motor or sensory function deficit type symptoms as discussed then recommend ED evaluation.  For history of high cholesterol, placed future lipid panel and metabolic panel.  We will also go ahead and get A1c on day you get your labs drawn.  Continue with low sugar diet and low-cholesterol diet.  For bronchitis please continue with medication regimen pulmonologist wrote today.  Again please try to quit smoking.  Follow-up by my chart regarding blood pressure readings in 1 week.  Also go ahead and tentatively set up a follow-up appointment with me in 2 to 3 weeks to discuss any potential residual headaches.

## 2018-03-25 NOTE — Progress Notes (Signed)
Subjective:    Patient ID: Derek Conner, male    DOB: [birth year], 57 y.o.   MRN: [id number]  HPI  Pt in for follow up.  He update me/reviews his recent lung infection in July. His infection lingered for a while but he did eventually feel better but not completely. He states some chest congestion and pulmonologist gave antibiotic again and prednisone.  The plan from pulmonologist Doxycycline 100mg  Twice daily  For 7 days, take with food. -wear sunscreen.  Mucinex DM Twice daily  As needed  Cough /congestion .  Use Flutter valve several times a day .  Prednisone taper over next week.  Work on not smoking .  Continue on Advair and Spiriva  Continue on Daliresp daily .  Follow up with Dr. in 3 months and As needed   Please contact office for sooner follow up if symptoms do not improve or worsen or seek emergency care   Pt did get his second him replacement and he states he is doing well. Only occasional rare ache here and there but no pain constant type. 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  Pt has high cholesterol hx. He is on fenofibrate.  Pt sugar mild elevation in the past. But a1-c last checked not in diabetic range.  Pt is still smoking. Smokes 5-10 cigarettes.   Pt bp is high today. No gross motor or sensory function deficits. No chest pain. Pt states almost every day will get ha early afternoon. No nausea. No vomiting. No light sensitivity. HA is about 4-5/10  level ha(no ha presently). New ha occurring past 2 weeks. No history of ha chronic in past. In past rare occasional ha very brief. Then does report very rare hx of migraine. States last time 3 years ago. He does not has been out of amlodipine for about 10 days.  Pt does have history of aneurysm and seen by Dr. 09-18-1984. Treated and lat mri showed.   IMPRESSION: 1. 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 2. No  acute or interval infarct. No detectable perianeurysmal Inflammation/enhancement.  Before aneurysm found he had cough followed by syncope in past. So imaging studies done and found the aneurysm.     Review of Systems  Constitutional: Negative for chills, fatigue and fever.  HENT: Negative for congestion, ear pain, facial swelling, hearing loss, mouth sores, nosebleeds and postnasal drip.   Respiratory: Positive for cough. Negative for chest tightness, shortness of breath and wheezing.        Chest congestion.  Smoker.  Gastrointestinal: Negative for abdominal pain, constipation, nausea and vomiting.  Genitourinary: Negative for dysuria, flank pain and frequency.  Musculoskeletal: Negative for gait problem.       Hip pain now controlled.  Skin: Negative for rash.  Neurological: Negative for dizziness, speech difficulty, weakness and headaches.       No ha presently. See hpi.  Hematological: Negative for adenopathy. Does not bruise/bleed easily.  Psychiatric/Behavioral: Negative for behavioral problems and decreased concentration.       Objective:   Physical Exam  General Mental Status- Alert. General Appearance- Not in acute distress.   Skin General: Color- Normal Color. Moisture- Normal Moisture.  Neck Carotid Arteries- Normal color. Moisture- Normal Moisture. No carotid bruits. No JVD.  Chest and Lung Exam Auscultation: Breath Sounds:-Normal.  Cardiovascular Auscultation:Rythm- Regular. Murmurs & Other Heart Sounds:Auscultation of the heart reveals- No Murmurs.  Abdomen Inspection:-Inspeection  Normal. Palpation/Percussion:Note:No mass. Palpation and Percussion of the abdomen reveal- Non Tender, Non Distended + BS, no rebound or guarding.   Neurologic Cranial Nerve exam:- CN III-XII intact(No nystagmus), symmetric smile. Drift Test:- No drift. Romberg Exam:- Negative.  Heal to Toe Gait exam:-Normal. Finger to Nose:- Normal/Intact Strength:- 5/5 equal and  symmetric strength both upper and lower extremities.      Assessment & Plan:  Your blood pressure is moderately elevated today.  Was 152/91 my nurse checked and when I checked it was actually 160/90.  Blood pressure was better at pulmonologist office.  You have not been on amlodipine over the last 10 days and I did refill that today.  Advised not to use any nonsteroid anti-inflammatory drugs such as Aleve or ibuprofen as that can increase your blood pressure.  I want you to check your blood pressure daily over the next week and update me on those readings.  Need to see if your blood pressure is less than 140/90.  You do have new onset headache over the last 2 weeks and this might be blood pressure related.  You do not have a history of chronic headaches in the past.  You have very good/normal neurologic exam today.  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  So following your blood pressure and knowing that you are headaches are improved/resolved is very important.  If not then would consider getting imaging studies or trying to get very quick referral back to your specialist.  If you have any gross motor or sensory function deficit type symptoms as discussed then recommend ED evaluation.  For history of high cholesterol, placed future lipid panel and metabolic panel.  We will also go ahead and get A1c on day you get your labs drawn.  Continue with low sugar diet and low-cholesterol diet.  For bronchitis please continue with medication regimen pulmonologist wrote today.  Again please try to quit smoking.  Follow-up by my chart regarding blood pressure readings in 1 week.  Also go ahead and tentatively set up a follow-up appointment with me in 2 to 3 weeks to discuss any potential residual headaches.   40 minutes spent with pt. 50% of time spent on counseling on chronic conditions. In addition to counseling on his recent new ha, htn and history of brain  aneurysm. Need to explain work up process and plan going forward in event bp control does not stop ha and associated work up that may need to be done in that event. Also counseled on ED evaluation if signs and symptoms worsen.  Esperanza Richters, PA-C

## 2018-03-25 NOTE — Progress Notes (Addendum)
@Patient  ID: Derek Conner, male    DOB: 08/25/1960, 57 y.o.   MRN: [id number]  Chief Complaint  Patient presents with  . Acute Visit    COPD     Referring provider: Marisue Brooklyn  HPI: 57 yo [name] smoker with GOLD III COPD and O2 RF -At bedtime  (not compliant )   TEST  PFTs  02/15/14 show severe airflow obstruction with an FEV1 of 19% with significant bronchodilator response with a 40% improvement. 03/31/2014  Spirometry FEV1 at 26%, and mid-flows at 9% Alpha 1 >Nml/ MM   03/25/18  Acute OV : COPD  Patient presents for an acute office visit.  She complains over the last 2 weeks has had increased cough congestion thick mucus and intermittent wheezing. He denies any chest pain orthopnea PND or increased leg swelling. -Works full-time. Last visit had sputum culture AFB was negative Remains on Advair, Spiriva, Daliresp. Still smoking , discussed cessation   Allergies  Allergen Reactions  . Symbicort [Budesonide-Formoterol Fumarate] Shortness Of Breath, Nausea And Vomiting and Other (See Comments)    heachache  . Dulera [Mometasone Furo-Formoterol Fum] Other (See Comments)    Headache   . Humira [Adalimumab] Nausea And Vomiting    Headaches     Immunization History  Administered Date(s) Administered  . Tdap 08/29/2015    Past Medical History:  Diagnosis Date  . Asthma   . CAP (community acquired pneumonia) 01/19/2016  . Cerebral aneurysm   . Chronic bronchitis (HCC)   . COPD (chronic obstructive pulmonary disease) (HCC)   . Emphysema lung (HCC)   . Emphysema of lung (HCC)   . GERD (gastroesophageal reflux disease)   . Hepatitis A infection ~ 1980  . High cholesterol   . Hypertension   . Pneumonia 1960s  . Rheumatoid arthritis (HCC)    "hands mostly"  ( 01/19/2016)  . Situational depression 12-04-09   "only when my mother died"  . Stroke [facility])    mini stroke when doing embolization, Dec 05, 2015, no deficits    Tobacco History: Social History    Tobacco Use  Smoking Status Current Every Day Smoker  . Packs/day: 1.00  . Years: 40.00  . Pack years: 40.00  . Types: Cigarettes  Smokeless Tobacco Never Used  Tobacco Comment   5 cigs per day   Ready to quit: No Counseling given: Yes Comment: 5 cigs per day   Outpatient Medications Prior to Visit  Medication Sig Dispense Refill  . acetaminophen (TYLENOL) 500 MG tablet Take 1,000 mg by mouth daily as needed for moderate pain or headache.    . ADVAIR DISKUS 500-50 MCG/DOSE AEPB INHALE 1 PUFF BY MOUTH INTO THE LUNGS 2 (TWO) TIMES DAILY. 60 each 3  . albuterol (PROVENTIL HFA;VENTOLIN HFA) 108 (90 Base) MCG/ACT inhaler Inhale 2 puffs into the lungs every 4 (four) hours as needed for wheezing or shortness of breath.    Marland Kitchen albuterol (PROVENTIL) (2.5 MG/3ML) 0.083% nebulizer solution Take 3 mLs (2.5 mg total) by nebulization every 6 (six) hours as needed for wheezing or shortness of breath. DX: J44.9 75 mL 12  . b complex vitamins tablet Take 1 tablet by mouth daily.    . cloNIDine (CATAPRES) 0.2 MG tablet Take 1 tablet (0.2 mg total) by mouth 2 (two) times daily. 60 tablet 11  . DALIRESP 500 MCG TABS tablet TAKE 1 TABLET (500 MCG TOTAL) BY MOUTH DAILY. 30 tablet 6  . famotidine (PEPCID) 20 MG tablet TAKE 1 TABLET (20  MG TOTAL) BY MOUTH AT BEDTIME. 90 tablet 1  . fenofibrate 54 MG tablet Take 1 tablet (54 mg total) by mouth daily. 30 tablet 2  . OXYGEN Inhale 2 L into the lungs at bedtime.    . pantoprazole (PROTONIX) 40 MG tablet TAKE 1 TABLET (40 MG TOTAL) BY MOUTH DAILY AT 12:00 NOON. 30 tablet 3  . tiotropium (SPIRIVA HANDIHALER) 18 MCG inhalation capsule PLACE 1 CAPSULE INTO INHALER AND INHALE INTO LUNGS DAILY. 90 capsule 3  . amLODipine (NORVASC) 10 MG tablet TAKE 1 TABLET (10 MG) BY MOUTH DAILY. (Patient not taking: Reported on 03/25/2018) 90 tablet 0  . doxycycline (VIBRA-TABS) 100 MG tablet Take 1 tablet (100 mg total) by mouth 2 (two) times daily. (Patient not taking: Reported on  03/25/2018) 10 tablet 0  . predniSONE (DELTASONE) 10 MG tablet 40 mg daily x 2 days, then 20mg  daily x 2 days, then 10mg  daily x 2 days, then 5mg  daily x 2 days and stop (Patient not taking: Reported on 03/25/2018) 15 tablet 0   Facility-Administered Medications Prior to Visit  Medication Dose Route Frequency Provider Last Rate Last Dose  . 0.9 %  sodium chloride infusion   Intravenous Continuous , PA-C      . aspirin EC tablet 325 mg  325 mg Oral 60 min Pre-Op , PA-C      . niMODipine (NIMOTOP) capsule 0-60 mg  0-60 mg Oral 60 min Pre-Op 03/27/2018, PA-C         Review of Systems  Constitutional:   No  weight loss, night sweats,  Fevers, chills,  +fatigue, or  lassitude.  HEENT:   No headaches,  Difficulty swallowing,  Tooth/dental problems, or  Sore throat,                No sneezing, itching, ear ache,  +nasal congestion, post nasal drip,   CV:  No chest pain,  Orthopnea, PND, swelling in lower extremities, anasarca, dizziness, palpitations, syncope.   GI  No heartburn, indigestion, abdominal pain, nausea, vomiting, diarrhea, change in bowel habits, loss of appetite, bloody stools.   Resp:    No chest wall deformity  Skin: no rash or lesions.  GU: no dysuria, change in color of urine, no urgency or frequency.  No flank pain, no hematuria   MS:  No joint pain or swelling.  No decreased range of motion.  No back pain.    Physical Exam  BP (!) 144/86 (BP Location: Left Arm, Cuff Size: Normal)   Pulse 72   Ht 6' (1.829 m)   Wt 179 lb 6.4 oz (81.4 kg)   SpO2 98%   BMI 24.33 kg/m   GEN: A/Ox3; pleasant , NAD    HEENT:  /AT,  EACs-clear, TMs-wnl, NOSE-clear, THROAT-clear, no lesions, no postnasal drip or exudate noted.   NECK:  Supple w/ fair ROM; no JVD; normal carotid impulses w/o bruits; no thyromegaly or nodules palpated; no lymphadenopathy.    RESP decreased BS in bases . no accessory muscle use, no dullness to percussion  CARD:   RRR, no m/r/g, no peripheral edema, pulses intact, no cyanosis or clubbing.  GI:   Soft & nt; nml bowel sounds; no organomegaly or masses detected.   Musco: Warm bil, no deformities or joint swelling noted.   Neuro: alert, no focal deficits noted.    Skin: Warm, no lesions or rashes    Lab Results:  CBC  BMET  BNP  ProBNP No results  found for: PROBNP  Imaging: No results found.   Assessment & Plan:   COPD (chronic obstructive pulmonary disease) Exacerbation   Plan  Patient Instructions  Doxycycline 100mg  Twice daily  For 7 days, take with food. -wear sunscreen.  Mucinex DM Twice daily  As needed  Cough /congestion .  Use Flutter valve several times a day .  Prednisone taper over next week.  Work on not smoking .  Continue on Advair and Spiriva  Continue on Daliresp daily .  Follow up with Dr. in 3 months and As needed   Please contact office for sooner follow up if symptoms do not improve or worsen or seek emergency care        Cigarette smoker Smoking cessation      Marchelle Gearing, NP 03/25/18

## 2018-03-25 NOTE — Patient Instructions (Addendum)
Doxycycline 100mg  Twice daily  For 7 days, take with food. -wear sunscreen.  Mucinex DM Twice daily  As needed  Cough /congestion .  Use Flutter valve several times a day .  Prednisone taper over next week.  Work on not smoking .  Continue on Advair and Spiriva  Continue on Daliresp daily .  Follow up with Dr. in 3 months and As needed   Please contact office for sooner follow up if symptoms do not improve or worsen or seek emergency care

## 2018-03-26 NOTE — Assessment & Plan Note (Signed)
Exacerbation   Plan  Patient Instructions  Doxycycline 100mg  Twice daily  For 7 days, take with food. -wear sunscreen.  Mucinex DM Twice daily  As needed  Cough /congestion .  Use Flutter valve several times a day .  Prednisone taper over next week.  Work on not smoking .  Continue on Advair and Spiriva  Continue on Daliresp daily .  Follow up with Dr. in 3 months and As needed   Please contact office for sooner follow up if symptoms do not improve or worsen or seek emergency care

## 2018-03-26 NOTE — Assessment & Plan Note (Signed)
Smoking cessation  

## 2018-03-27 ENCOUNTER — Ambulatory Visit (HOSPITAL_BASED_OUTPATIENT_CLINIC_OR_DEPARTMENT_OTHER)
Admission: RE | Admit: 2018-03-27 | Discharge: 2018-03-27 | Disposition: A | Discharge status: Home / Self Care | Payer: 59 | Source: Ambulatory Visit | Attending: Internal Medicine | Admitting: Internal Medicine

## 2018-03-27 DIAGNOSIS — J479 Bronchiectasis, uncomplicated: Secondary | ICD-10-CM | POA: Diagnosis not present

## 2018-03-27 DIAGNOSIS — R911 Solitary pulmonary nodule: Secondary | ICD-10-CM | POA: Diagnosis not present

## 2018-03-27 DIAGNOSIS — J432 Centrilobular emphysema: Secondary | ICD-10-CM | POA: Diagnosis not present

## 2018-03-27 DIAGNOSIS — R918 Other nonspecific abnormal finding of lung field: Secondary | ICD-10-CM | POA: Diagnosis not present

## 2018-03-30 ENCOUNTER — Other Ambulatory Visit: Payer: 59

## 2018-04-01 ENCOUNTER — Encounter (INDEPENDENT_AMBULATORY_CARE_PROVIDER_SITE_OTHER): Payer: Self-pay | Admitting: Orthopedic Surgery

## 2018-04-01 ENCOUNTER — Ambulatory Visit (INDEPENDENT_AMBULATORY_CARE_PROVIDER_SITE_OTHER): Payer: 59

## 2018-04-01 ENCOUNTER — Ambulatory Visit (INDEPENDENT_AMBULATORY_CARE_PROVIDER_SITE_OTHER): Payer: 59 | Admitting: Orthopedic Surgery

## 2018-04-01 DIAGNOSIS — Z96642 Presence of left artificial hip joint: Secondary | ICD-10-CM

## 2018-04-01 NOTE — Progress Notes (Signed)
Office Visit Note   Patient: Derek Conner           Date of Birth: [birth year]           MRN: [id number] Visit Date: 04/01/2018 Requested by: Derek Pai, PA-C [place], [place] [address] PCP: Derek Conner  Subjective: Chief Complaint  Patient presents with  . Left Hip - Follow-up    HPI: Derek Conner is a patient who is now about 4 months out left total hip replacement.  Patient's been doing reasonably well.  He is starting to have a little bit of pain at the superior aspect of his incision.  He is not having any fevers or chills.  He is not having any groin pain.              ROS: All systems reviewed are negative as they relate to the chief complaint within the history of present illness.  Patient denies  fevers or chills.   Assessment & Plan: Visit Diagnoses:  1. Status post total replacement of left hip     Plan: Impression is left hip pain which is more in the proximal leg.  Radiographs look okay.  I am going to draw some labs on him today.  I will call him with results.  If the lab values are elevated for infection I may have Derek Conner aspirate the hip.  I will call him with the results  Follow-Up Instructions: No follow-ups on file.   Orders:  Orders Placed This Encounter  Procedures  . XR HIP UNILAT W OR W/O PELVIS 1V LEFT  . CBC With Differential  . Sed Rate (ESR)  . C-reactive protein   No orders of the defined types were placed in this encounter.     Procedures: No procedures performed   Clinical Data: No additional findings.  Objective: Vital Signs: There were no vitals taken for this visit.  Physical Exam:   Constitutional: Patient appears well-developed HEENT:  Head: Normocephalic Eyes:EOM are normal Neck: Normal range of motion Cardiovascular: Normal rate Pulmonary/chest: Effort normal Neurologic: Patient is alert Skin: Skin is warm Psychiatric: Patient has normal mood and affect    Ortho Exam:  Ortho exam demonstrates equal leg lengths no groin pain with internal/external rotation of either hip.  No real warmth or swelling asymmetrically left incision versus right.  Incision itself is intact and nontender.  No warmth or erythema around either incision.  Hip range of motion is supple and full.  Specialty Comments:  No specialty comments available.  Imaging: Xr Hip Unilat W Or W/o Pelvis 1v Left  Result Date: 04/01/2018 Lateral left hip reviewed.  No evidence of hardware complication loosening or fracture.  Total hip replacement appears to be in good position and alignment unchanged from prior radiographs.    PMFS History: Patient Active Problem List   Diagnosis Date Noted  . MAI (mycobacterium avium-intracellulare) ([place]) 01/28/2018  . 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 Nocturnal hypoxemia 04/03/2017  . Fatigue 01/09/2017  . Thrush 12/12/2016  . Brain aneurysm 03/27/2016  . Preop respiratory exam 03/26/2016  . Aneurysm ([place])   . Chronic obstructive pulmonary disease with acute exacerbation ([place]) 01/19/2016  . Community acquired pneumonia 01/19/2016  . Cough syncope 01/19/2016  . Wellness examination 08/29/2015  . Hyperglycemia 01/06/2015  . HTN (hypertension) 12/09/2014  . COPD exacerbation ([place]) 07/08/2014  . Atypical chest pain 07/08/2014  . COPD, severe ([place]) 02/15/2014  . Dry mouth 12/26/2013  . Cigarette smoker  12/26/2013  . Nodule of right lung 12/26/2013  . COPD, severity to be determined ([place]) 12/26/2013  . Pulmonary nodule, right 12/06/2013  . Hemoptysis 12/03/2013  . COPD (chronic obstructive pulmonary disease) ([place]) 12/03/2013  . Acute bronchitis 12/03/2013  . Tobacco use disorder 12/03/2013   Past Medical History:  Diagnosis Date  . Asthma   . CAP (community acquired pneumonia) 01/19/2016  . Cerebral aneurysm   . Chronic bronchitis ([place])   . COPD (chronic obstructive pulmonary disease) ([place])   . Emphysema lung ([place])   . Emphysema of lung ([place])   . GERD  (gastroesophageal reflux disease)   . Hepatitis A infection ~ 1980  . High cholesterol   . Hypertension   . Pneumonia 1960s  . Rheumatoid arthritis ([place])    "hands mostly"  ( 01/19/2016)  . Situational depression 05-Nov-2009   "only when my mother died"  . Stroke [facility])    mini stroke when doing embolization, 11/06/15, no deficits    Family History  Problem Relation Age of Onset  . Cancer Mother   . Arthritis Mother   . Stroke Father   . Hypertension Father   . Healthy Daughter   . Healthy Son   . Colon cancer Neg Hx     Past Surgical History:  Procedure Laterality Date  . APPENDECTOMY  1980's  . CHEST TUBE INSERTION Left 1990's   "lung collapsed"  . CYSTECTOMY Left 1960's   "wrist"  . FOOT FRACTURE SURGERY Left ~ November 06, 2003   "it was crushed"  . IR 3D INDEPENDENT [facility]KST  01/08/2018  . IR ANGIO INTRA EXTRACRAN SEL COM CAROTID INNOMINATE BILAT MOD SED  01/08/2018  . IR ANGIO VERTEBRAL SEL VERTEBRAL BILAT MOD SED  01/08/2018  . IR GENERIC HISTORICAL  03/27/2016   IR ANGIOGRAM FOLLOW UP STUDY 03/27/2016 Luanne Bras, MD [facility]  . IR GENERIC HISTORICAL  03/27/2016   IR ANGIOGRAM FOLLOW UP STUDY 03/27/2016 Luanne Bras, MD [facility]  . IR GENERIC HISTORICAL  03/27/2016   IR ANGIO INTRA EXTRACRAN SEL COM CAROTID INNOMINATE UNI L MOD SED 03/27/2016 Luanne Bras, MD [facility]  . IR GENERIC HISTORICAL  03/27/2016   IR ANGIOGRAM FOLLOW UP STUDY 03/27/2016 Luanne Bras, MD [facility]  . IR GENERIC HISTORICAL  03/27/2016   IR ANGIO VERTEBRAL SEL SUBCLAVIAN INNOMINATE UNI R MOD SED 03/27/2016 Luanne Bras, MD [facility]  . IR GENERIC HISTORICAL  03/27/2016   IR 3D INDEPENDENT [facility]KST 03/27/2016 Luanne Bras, MD [facility]  . IR GENERIC HISTORICAL  03/27/2016   IR ANGIOGRAM FOLLOW UP STUDY 03/27/2016 Luanne Bras, MD [facility]  . IR GENERIC HISTORICAL  03/27/2016   IR NEURO EACH ADD'L AFTER BASIC UNI RIGHT (MS) 03/27/2016 Luanne Bras, MD [facility]  . IR  GENERIC HISTORICAL  03/27/2016   IR ANGIOGRAM FOLLOW UP STUDY 03/27/2016 Luanne Bras, MD [facility]  . IR GENERIC HISTORICAL  03/27/2016   IR TRANSCATH/EMBOLIZ 03/27/2016 Luanne Bras, MD [facility]  . IR GENERIC HISTORICAL  03/27/2016   IR ANGIO INTRA EXTRACRAN SEL INTERNAL CAROTID UNI R MOD SED 03/27/2016 Luanne Bras, MD [facility]  . IR GENERIC HISTORICAL  03/27/2016   IR ANGIOGRAM FOLLOW UP STUDY 03/27/2016 Luanne Bras, MD [facility]  . IR GENERIC HISTORICAL  04/12/2016   IR RADIOLOGIST EVAL & MGMT 04/12/2016 [facility]  . RADIOLOGY WITH ANESTHESIA N/A 02/29/2016   Procedure: EMBOLIZATION     (RADIOLOGY WITH ANESTHESIA);  Surgeon: Luanne Bras, MD;  Location: [facility];  Service: Radiology;  Laterality: N/A;  . RADIOLOGY WITH ANESTHESIA N/A 03/27/2016   Procedure: Embolization;  Surgeon: Luanne Bras, MD;  Location: [facility];  Service: Radiology;  Laterality: N/A;  . TOTAL HIP ARTHROPLASTY Right 09/23/2017  . TOTAL HIP ARTHROPLASTY Right 09/23/2017   Procedure: RIGHT TOTAL HIP ARTHROPLASTY ANTERIOR APPROACH;  Surgeon: Meredith Pel, MD;  Location: [facility];  Service: Orthopedics;  Laterality: Right;  . TOTAL HIP ARTHROPLASTY Left 11/26/2017   Procedure: LEFT TOTAL HIP ARTHROPLASTY ANTERIOR APPROACH;  Surgeon: Meredith Pel, MD;  Location: [facility];  Service: Orthopedics;  Laterality: Left;  Marland Kitchen VIDEO BRONCHOSCOPY Bilateral 12/06/2013   Procedure: VIDEO BRONCHOSCOPY WITHOUT FLUORO;  Surgeon: Rush Farmer, MD;  Location: [facility];  Service: Cardiopulmonary;  Laterality: Bilateral;   Social History   Occupational History  . Not on file  Tobacco Use  . Smoking status: Current Every Day Smoker    Packs/day: 1.00    Years: 40.00    Pack years: 40.00    Types: Cigarettes  . Smokeless tobacco: Never Used  . Tobacco comment: 5 cigs per day  Substance and Sexual Activity  . Alcohol use: Yes    Alcohol/week: 2.0 - 3.0 standard drinks    Types: 2 - 3  Shots of liquor per week    Comment: 3 drinks liquor per day  . Drug use: Yes    Types: Marijuana    Comment: 1-2 times per week  . Sexual activity: Yes

## 2018-04-02 LAB — C-REACTIVE PROTEIN: CRP: 2 mg/L (ref <8.0)

## 2018-04-02 LAB — SEDIMENTATION RATE: SED RATE: 6 mm/h (ref 0–20)

## 2018-04-02 MED FILL — FENOFIBRATE 54 MG TABLET: 54 | 30 days supply | Qty: 30 | Fill #1

## 2018-04-02 MED FILL — ADVAIR 500/50 DISKUS: 500-50 | 30 days supply | Qty: 60 | Fill #1

## 2018-04-02 MED FILL — ALBUTEROL 0.083% INHAL SOLN: (2.5 MG/3ML | 6 days supply | Qty: 75 | Fill #7

## 2018-04-02 MED FILL — PANTOPRAZOLE SOD DR 40 MG T: 40 | 30 days supply | Qty: 30 | Fill #1

## 2018-04-02 NOTE — Progress Notes (Signed)
Labs normal.  No infection.  Please call thanks.

## 2018-04-03 LAB — CBC WITH DIFFERENTIAL
BASOS ABS: 0 10*3/uL (ref 0.0–0.2)
BASOS: 0 %
EOS (ABSOLUTE): 0.1 10*3/uL (ref 0.0–0.4)
Eos: 1 %
Hematocrit: 44.9 % (ref 37.5–51.0)
Hemoglobin: 14.6 g/dL (ref 13.0–17.7)
IMMATURE GRANS (ABS): 0 10*3/uL (ref 0.0–0.1)
Immature Granulocytes: 0 %
LYMPHS: 25 %
Lymphocytes Absolute: 2.7 10*3/uL (ref 0.7–3.1)
MCH: 27.9 pg (ref 26.6–33.0)
MCHC: 32.5 g/dL (ref 31.5–35.7)
MCV: 86 fL (ref 79–97)
MONOCYTES: 9 %
Monocytes Absolute: 1 10*3/uL — ABNORMAL HIGH (ref 0.1–0.9)
NEUTROS ABS: 6.8 10*3/uL (ref 1.4–7.0)
NEUTROS PCT: 65 %
RBC: 5.24 x10E6/uL (ref 4.14–5.80)
RDW: 15.8 % — ABNORMAL HIGH (ref 12.3–15.4)
WBC: 10.6 10*3/uL (ref 3.4–10.8)

## 2018-04-03 NOTE — Progress Notes (Signed)
Labs normal.  No infection.  Please call thanks.

## 2018-04-07 DIAGNOSIS — J449 Chronic obstructive pulmonary disease, unspecified: Secondary | ICD-10-CM | POA: Diagnosis not present

## 2018-04-07 DIAGNOSIS — J441 Chronic obstructive pulmonary disease with (acute) exacerbation: Secondary | ICD-10-CM | POA: Diagnosis not present

## 2018-04-07 DIAGNOSIS — R0902 Hypoxemia: Secondary | ICD-10-CM | POA: Diagnosis not present

## 2018-04-08 ENCOUNTER — Other Ambulatory Visit (INDEPENDENT_AMBULATORY_CARE_PROVIDER_SITE_OTHER): Payer: 59

## 2018-04-08 DIAGNOSIS — R739 Hyperglycemia, unspecified: Secondary | ICD-10-CM

## 2018-04-08 DIAGNOSIS — I10 Essential (primary) hypertension: Secondary | ICD-10-CM

## 2018-04-08 DIAGNOSIS — E785 Hyperlipidemia, unspecified: Secondary | ICD-10-CM | POA: Diagnosis not present

## 2018-04-08 LAB — LIPID PANEL
Cholesterol: 196 mg/dL (ref 0–200)
HDL: 63.4 mg/dL (ref >39.00)
NONHDL: 132.91
Total CHOL/HDL Ratio: 3
Triglycerides: 207 mg/dL — ABNORMAL HIGH (ref 0.0–149.0)
VLDL: 41.4 mg/dL — AB (ref 0.0–40.0)

## 2018-04-08 LAB — COMPREHENSIVE METABOLIC PANEL
ALT: 15 U/L (ref 0–53)
AST: 17 U/L (ref 0–37)
Albumin: 4.1 g/dL (ref 3.5–5.2)
Alkaline Phosphatase: 80 U/L (ref 39–117)
BUN: 15 mg/dL (ref 6–23)
CHLORIDE: 101 meq/L (ref 96–112)
CO2: 27 meq/L (ref 19–32)
Calcium: 10 mg/dL (ref 8.4–10.5)
Creatinine, Ser: 1.24 mg/dL (ref 0.40–1.50)
GFR: 63.7 mL/min (ref >60.00)
GLUCOSE: 101 mg/dL — AB (ref 70–99)
POTASSIUM: 4.6 meq/L (ref 3.5–5.1)
SODIUM: 137 meq/L (ref 135–145)
Total Bilirubin: 0.3 mg/dL (ref 0.2–1.2)
Total Protein: 6.7 g/dL (ref 6.0–8.3)

## 2018-04-08 LAB — LDL CHOLESTEROL, DIRECT: Direct LDL: 116 mg/dL

## 2018-04-08 LAB — HEMOGLOBIN A1C: Hgb A1c MFr Bld: 6.3 % (ref 4.6–6.5)

## 2018-04-10 ENCOUNTER — Ambulatory Visit (HOSPITAL_BASED_OUTPATIENT_CLINIC_OR_DEPARTMENT_OTHER)
Admission: RE | Admit: 2018-04-10 | Discharge: 2018-04-10 | Disposition: A | Discharge status: Home / Self Care | Payer: 59 | Source: Ambulatory Visit | Attending: Medical | Admitting: Medical

## 2018-04-10 ENCOUNTER — Encounter: Payer: Self-pay | Admitting: Medical

## 2018-04-10 ENCOUNTER — Ambulatory Visit (INDEPENDENT_AMBULATORY_CARE_PROVIDER_SITE_OTHER): Payer: 59 | Admitting: Medical

## 2018-04-10 VITALS — BP 147/93 | HR 87 | Temp 98.2°F | Resp 16 | Ht 72.0 in | Wt 177.8 lb

## 2018-04-10 DIAGNOSIS — I700 Atherosclerosis of aorta: Secondary | ICD-10-CM | POA: Insufficient documentation

## 2018-04-10 DIAGNOSIS — R918 Other nonspecific abnormal finding of lung field: Secondary | ICD-10-CM | POA: Insufficient documentation

## 2018-04-10 DIAGNOSIS — J40 Bronchitis, not specified as acute or chronic: Secondary | ICD-10-CM

## 2018-04-10 DIAGNOSIS — R05 Cough: Secondary | ICD-10-CM | POA: Diagnosis not present

## 2018-04-10 DIAGNOSIS — I10 Essential (primary) hypertension: Secondary | ICD-10-CM | POA: Diagnosis not present

## 2018-04-10 DIAGNOSIS — F419 Anxiety disorder, unspecified: Secondary | ICD-10-CM | POA: Diagnosis not present

## 2018-04-10 MED ORDER — LOSARTAN POTASSIUM 25 MG PO TABS: 25.0000 mg | ORAL_TABLET | Freq: Every day | ORAL | 3 refills | Status: DC

## 2018-04-10 MED ORDER — ALPRAZOLAM 0.5 MG PO TABS: 0.5000 mg | ORAL_TABLET | Freq: Two times a day (BID) | ORAL | 0 refills | Status: DC | PRN

## 2018-04-10 MED ORDER — AZITHROMYCIN 250 MG PO TABS: ORAL_TABLET | ORAL | 0 refills | Status: DC

## 2018-04-10 MED FILL — ALPRAZolam 0.5 MG TABS: 0.5 | 8 days supply | Qty: 15 | Fill #0

## 2018-04-10 MED FILL — AZITHROMYCIN 250 MG TABLET: 250 | 5 days supply | Qty: 6 | Fill #0

## 2018-04-10 MED FILL — LOSARTAN POTASSIUM 25 MG TA: 25 | 90 days supply | Qty: 90 | Fill #0

## 2018-04-10 NOTE — Patient Instructions (Signed)
Your blood pressure is high today when I checked and recently high as well when you check with new blood pressure cuff.  Would recommend that you continue to check blood pressure with new cuff and have your wife/RN confirmed machine giving accurate reading.  If blood pressure remaining over 140/90 then start losartan to current regimen.  Reminder to take clonidine regular basis as if you actually skip clonidine dose your blood pressure might have rebound.  For anxiety, I am prescribing Xanax 0.5 mg dose to take twice daily as needed for severe anxiety.  Historically you have used Xanax very sparingly.  I will see how long this low number last.  If having to use on frequent repetitive basis then would consider low-dose SSRI as well as getting you signed contract and give a UDS.  For bronchitis, I will prescribe a azithromycin antibiotic.  We will continue with your inhaler regimen that pulmonologist has prescribed.  I do want you to get a chest x-ray today as we approached the weekend.  Follow-up in 2 weeks or as needed.  Would like to confirm losartan controlling your blood pressure.

## 2018-04-10 NOTE — Progress Notes (Signed)
Subjective:    Patient ID: Derek Conner, male    DOB: 1961/01/03, 57 y.o.   MRN: [id number]  HPI  Pt in for follow up.  He has hx of htn. He states he has new blood pressure cuff and has been checking his bp daily last 2 days. He states his bp have been too high. 2 days ago in the morning  his blood pressure was 189/101. He states night before did not sleep well and was stressed.  No gross motor or sensory function deficits at that time. This morning his bp was 158/90. Pt wife is nurse and she checked manually. On 03/30/2018 wife checked manually and 140/86.   Pt states recently he getting more anxious. States work is more stressful. He has used xanax in past but he states rare sporadic use. Not reporting depression.He want xanax just to use for periodic severe anxiety.  History of chronic and recurrent bronchitis symptoms. Maybe little worse than baseline(he bringing up some mucus). He is wheezing intermittent but not worse than basline   Review of Systems  Constitutional: Negative for chills, fatigue and fever.  Respiratory: Negative for chest tightness, shortness of breath and wheezing.   Cardiovascular: Negative for chest pain and palpitations.  Gastrointestinal: Negative for abdominal pain.  Hematological: Negative for adenopathy. Does not bruise/bleed easily.  Psychiatric/Behavioral: Negative for agitation, confusion, dysphoric mood, sleep disturbance and suicidal ideas. The patient is nervous/anxious.    Past Medical History:  Diagnosis Date  . Asthma   . CAP (community acquired pneumonia) 01/19/2016  . Cerebral aneurysm   . Chronic bronchitis (HCC)   . COPD (chronic obstructive pulmonary disease) (HCC)   . Emphysema lung (HCC)   . Emphysema of lung (HCC)   . GERD (gastroesophageal reflux disease)   . Hepatitis A infection ~ 1980  . High cholesterol   . Hypertension   . Pneumonia 1960s  . Rheumatoid arthritis (HCC)    "hands mostly"  ( 01/19/2016)  . Situational  depression Dec 02, 2009   "only when my mother died"  . Stroke [facility])    mini stroke when doing embolization, Dec 03, 2015, no deficits     Social History   Socioeconomic History  . Marital status: Married    Spouse name: Not on file  . Number of children: Not on file  . Years of education: Not on file  . Highest education level: Not on file  Occupational History  . Not on file  Social Needs  . Financial resource strain: Not on file  . Food insecurity:    Worry: Not on file    Inability: Not on file  . Transportation needs:    Medical: Not on file    Non-medical: Not on file  Tobacco Use  . Smoking status: Current Every Day Smoker    Packs/day: 1.00    Years: 40.00    Pack years: 40.00    Types: Cigarettes  . Smokeless tobacco: Never Used  . Tobacco comment: 5 cigs per day  Substance and Sexual Activity  . Alcohol use: Yes    Alcohol/week: 2.0 - 3.0 standard drinks    Types: 2 - 3 Shots of liquor per week    Comment: 3 drinks liquor per day  . Drug use: Yes    Types: Marijuana    Comment: 1-2 times per week  . Sexual activity: Yes  Lifestyle  . Physical activity:    Days per week: Not on file    Minutes per session: Not on  file  . Stress: Not on file  Relationships  . Social connections:    Talks on phone: Not on file    Gets together: Not on file    Attends religious service: Not on file    Active member of club or organization: Not on file    Attends meetings of clubs or organizations: Not on file    Relationship status: Not on file  . Intimate partner violence:    Fear of current or ex partner: Not on file    Emotionally abused: Not on file    Physically abused: Not on file    Forced sexual activity: Not on file  Other Topics Concern  . Not on file  Social History Narrative  . Not on file    Past Surgical History:  Procedure Laterality Date  . APPENDECTOMY  1980's  . CHEST TUBE INSERTION Left 1990's   "lung collapsed"  . CYSTECTOMY Left 1960's   "wrist"  .  FOOT FRACTURE SURGERY Left ~ 2005   "it was crushed"  . IR 3D INDEPENDENT WKST  01/08/2018  . IR ANGIO INTRA EXTRACRAN SEL COM CAROTID INNOMINATE BILAT MOD SED  01/08/2018  . IR ANGIO VERTEBRAL SEL VERTEBRAL BILAT MOD SED  01/08/2018  . IR GENERIC HISTORICAL  03/27/2016   IR ANGIOGRAM FOLLOW UP STUDY 03/27/2016 Julieanne Cotton, MD [facility]  . IR GENERIC HISTORICAL  03/27/2016   IR ANGIOGRAM FOLLOW UP STUDY 03/27/2016 Julieanne Cotton, MD [facility]  . IR GENERIC HISTORICAL  03/27/2016   IR ANGIO INTRA EXTRACRAN SEL COM CAROTID INNOMINATE UNI L MOD SED 03/27/2016 Julieanne Cotton, MD [facility]  . IR GENERIC HISTORICAL  03/27/2016   IR ANGIOGRAM FOLLOW UP STUDY 03/27/2016 Julieanne Cotton, MD [facility]  . IR GENERIC HISTORICAL  03/27/2016   IR ANGIO VERTEBRAL SEL SUBCLAVIAN INNOMINATE UNI R MOD SED 03/27/2016 Julieanne Cotton, MD [facility]  . IR GENERIC HISTORICAL  03/27/2016   IR 3D INDEPENDENT WKST 03/27/2016 Julieanne Cotton, MD [facility]  . IR GENERIC HISTORICAL  03/27/2016   IR ANGIOGRAM FOLLOW UP STUDY 03/27/2016 Julieanne Cotton, MD [facility]  . IR GENERIC HISTORICAL  03/27/2016   IR NEURO EACH ADD'L AFTER BASIC UNI RIGHT (MS) 03/27/2016 Julieanne Cotton, MD [facility]  . IR GENERIC HISTORICAL  03/27/2016   IR ANGIOGRAM FOLLOW UP STUDY 03/27/2016 Julieanne Cotton, MD [facility]  . IR GENERIC HISTORICAL  03/27/2016   IR TRANSCATH/EMBOLIZ 03/27/2016 Julieanne Cotton, MD [facility]  . IR GENERIC HISTORICAL  03/27/2016   IR ANGIO INTRA EXTRACRAN SEL INTERNAL CAROTID UNI R MOD SED 03/27/2016 Julieanne Cotton, MD [facility]  . IR GENERIC HISTORICAL  03/27/2016   IR ANGIOGRAM FOLLOW UP STUDY 03/27/2016 Julieanne Cotton, MD [facility]  . IR GENERIC HISTORICAL  04/12/2016   IR RADIOLOGIST EVAL & MGMT 04/12/2016 [facility]  . RADIOLOGY WITH ANESTHESIA N/A 02/29/2016   Procedure: EMBOLIZATION     (RADIOLOGY WITH ANESTHESIA);  Surgeon: Julieanne Cotton, MD;   Location: [facility] OR;  Service: Radiology;  Laterality: N/A;  . RADIOLOGY WITH ANESTHESIA N/A 03/27/2016   Procedure: Embolization;  Surgeon: Julieanne Cotton, MD;  Location: [facility] OR;  Service: Radiology;  Laterality: N/A;  . TOTAL HIP ARTHROPLASTY Right 09/23/2017  . TOTAL HIP ARTHROPLASTY Right 09/23/2017   Procedure: RIGHT TOTAL HIP ARTHROPLASTY ANTERIOR APPROACH;  Surgeon: Cammy Copa, MD;  Location: [facility] OR;  Service: Orthopedics;  Laterality: Right;  . TOTAL HIP ARTHROPLASTY Left 11/26/2017   Procedure: LEFT TOTAL  HIP ARTHROPLASTY ANTERIOR APPROACH;  Surgeon: Cammy Copa, MD;  Location: [facility] OR;  Service: Orthopedics;  Laterality: Left;  Marland Kitchen VIDEO BRONCHOSCOPY Bilateral 12/06/2013   Procedure: VIDEO BRONCHOSCOPY WITHOUT FLUORO;  Surgeon: Alyson Reedy, MD;  Location: [facility] ENDOSCOPY;  Service: Cardiopulmonary;  Laterality: Bilateral;    Family History  Problem Relation Age of Onset  . Cancer Mother   . Arthritis Mother   . Stroke Father   . Hypertension Father   . Healthy Daughter   . Healthy Son   . Colon cancer Neg Hx     Allergies  Allergen Reactions  . Symbicort [Budesonide-Formoterol Fumarate] Shortness Of Breath, Nausea And Vomiting and Other (See Comments)    heachache  . Dulera [Mometasone Furo-Formoterol Fum] Other (See Comments)    Headache   . Humira [Adalimumab] Nausea And Vomiting    Headaches     Current Outpatient Medications on File Prior to Visit  Medication Sig Dispense Refill  . acetaminophen (TYLENOL) 500 MG tablet Take 1,000 mg by mouth daily as needed for moderate pain or headache.    . ADVAIR DISKUS 500-50 MCG/DOSE AEPB INHALE 1 PUFF BY MOUTH INTO THE LUNGS 2 (TWO) TIMES DAILY. 60 each 3  . albuterol (PROVENTIL HFA;VENTOLIN HFA) 108 (90 Base) MCG/ACT inhaler Inhale 2 puffs into the lungs every 4 (four) hours as needed for wheezing or shortness of breath.    Marland Kitchen albuterol (PROVENTIL) (2.5 MG/3ML) 0.083% nebulizer solution Take 3 mLs (2.5 mg total) by  nebulization every 6 (six) hours as needed for wheezing or shortness of breath. DX: J44.9 75 mL 12  . amLODipine (NORVASC) 10 MG tablet TAKE 1 TABLET (10 MG) BY MOUTH DAILY. 90 tablet 3  . b complex vitamins tablet Take 1 tablet by mouth daily.    . cloNIDine (CATAPRES) 0.2 MG tablet Take 1 tablet (0.2 mg total) by mouth 2 (two) times daily. 60 tablet 11  . DALIRESP 500 MCG TABS tablet TAKE 1 TABLET (500 MCG TOTAL) BY MOUTH DAILY. 30 tablet 6  . famotidine (PEPCID) 20 MG tablet TAKE 1 TABLET (20 MG TOTAL) BY MOUTH AT BEDTIME. 90 tablet 1  . fenofibrate 54 MG tablet Take 1 tablet (54 mg total) by mouth daily. 30 tablet 2  . OXYGEN Inhale 2 L into the lungs at bedtime.    . pantoprazole (PROTONIX) 40 MG tablet TAKE 1 TABLET (40 MG TOTAL) BY MOUTH DAILY AT 12:00 NOON. 30 tablet 3  . tiotropium (SPIRIVA HANDIHALER) 18 MCG inhalation capsule PLACE 1 CAPSULE INTO INHALER AND INHALE INTO LUNGS DAILY. 90 capsule 3   Current Facility-Administered Medications on File Prior to Visit  Medication Dose Route Frequency Provider Last Rate Last Dose  . 0.9 %  sodium chloride infusion   Intravenous Continuous Ralene Muskrat, PA-C      . aspirin EC tablet 325 mg  325 mg Oral 60 min Pre-Op Ralene Muskrat, PA-C      . niMODipine (NIMOTOP) capsule 0-60 mg  0-60 mg Oral 60 min Pre-Op Ralene Muskrat, PA-C        BP (!) 147/93   Pulse 87   Temp 98.2 F (36.8 C) (Oral)   Resp 16   Ht 6' (1.829 m)   Wt 177 lb 12.8 oz (80.6 kg)   SpO2 100%   BMI 24.11 kg/m       Objective:   Physical Exam  General Mental Status- Alert. General Appearance- Not in acute distress.   Skin General: Color- Normal Color. Moisture-  Normal Moisture.  Neck Carotid Arteries- Normal color. Moisture- Normal Moisture. No carotid bruits. No JVD.  Chest and Lung Exam Auscultation: Breath Sounds:-clear bilaterally but very shallow.  Cardiovascular Auscultation:Rythm- Regular. Murmurs & Other Heart Sounds:Auscultation of the  heart reveals- No Murmurs.  Abdomen Inspection:-Inspeection Normal. Palpation/Percussion:Note:No mass. Palpation and Percussion of the abdomen reveal- Non Tender, Non Distended + BS, no rebound or guarding.    Neurologic Cranial Nerve exam:- CN III-XII intact(No nystagmus), symmetric smile. Strength:- 5/5 equal and symmetric strength both upper and lower extremities.      Assessment & Plan:  Your blood pressure is high today when I checked and recently high as well when you check with new blood pressure cuff.  Would recommend that you continue to check blood pressure with new cuff and have your wife/RN confirmed machine giving accurate reading.  If blood pressure remaining over 140/90 then start losartan to current regimen.  Reminder to take clonidine regular basis as if you actually skip clonidine dose your blood pressure might have rebound.  For anxiety, I am prescribing Xanax 0.5 mg dose to take twice daily as needed for severe anxiety.  Historically you have used Xanax very sparingly.  I will see how long this low number last.  If having to use on frequent repetitive basis then would consider low-dose SSRI as well as getting you signed contract and give a UDS.  For bronchitis, I will prescribe a azithromycin antibiotic.  We will continue with your inhaler regimen that pulmonologist has prescribed.  I do want you to get a chest x-ray today as we approached the weekend.  Follow-up in 2 weeks or as needed.  Would like to confirm losartan controlling your blood pressure.  Esperanza Richters, PA-C

## 2018-04-15 MED FILL — DALIRESP 500 MCG TABLET: 500 | 30 days supply | Qty: 30 | Fill #6

## 2018-04-15 MED FILL — cloNIDine HCL 0.2 MG TABS: 0.2 | 90 days supply | Qty: 180 | Fill #3

## 2018-04-22 ENCOUNTER — Other Ambulatory Visit: Payer: Self-pay | Admitting: Acute Care

## 2018-04-22 MED FILL — SPIRIVA 18 MCG CP-HANDIHALE: 18 | 90 days supply | Qty: 90 | Fill #1

## 2018-04-23 MED FILL — ALBUTEROL 0.083% INHAL SOLN: (2.5 MG/3ML | 24 days supply | Qty: 300 | Fill #0

## 2018-04-29 ENCOUNTER — Telehealth: Payer: Self-pay | Admitting: Internal Medicine

## 2018-04-29 MED ORDER — DOXYCYCLINE HYCLATE 100 MG PO TABS: 100.0000 mg | ORAL_TABLET | Freq: Two times a day (BID) | ORAL | 0 refills | Status: DC

## 2018-04-29 MED ORDER — PREDNISONE 10 MG PO TABS: ORAL_TABLET | ORAL | 0 refills | Status: DC

## 2018-04-29 MED FILL — DOXYCYCLINE HYCLATE 100 MG: 100 | 5 days supply | Qty: 10 | Fill #0

## 2018-04-29 MED FILL — predniSONE 10 MG TABS: 10 | 8 days supply | Qty: 30 | Fill #0

## 2018-04-29 NOTE — Telephone Encounter (Signed)
Take doxycycline 100mg  po twice daily x 5 days; take after meals and avoid sunlight  Take prednisone 40 mg daily x 2 days, then 20mg  daily x 2 days, then 10mg  daily x 2 days, then 5mg  daily x 2 days and stop    Allergies  Allergen Reactions  . Symbicort [Budesonide-Formoterol Fumarate] Shortness Of Breath, Nausea And Vomiting and Other (See Comments)    heachache  . Dulera [Mometasone Furo-Formoterol Fum] Other (See Comments)    Headache   . Humira [Adalimumab] Nausea And Vomiting    Headaches

## 2018-04-29 NOTE — Telephone Encounter (Signed)
Patient is aware and verbalized understanding. Medications sent.

## 2018-04-29 NOTE — Telephone Encounter (Signed)
For  2 days now trouble breathing ,chest congestion, cough mucus is clear. Wheezing. He has tried mucinex but it has not helped much. He would like something called in to [facility].  Dr. Marchelle Gearing please advised

## 2018-05-04 ENCOUNTER — Telehealth: Payer: Self-pay | Admitting: Internal Medicine

## 2018-05-04 NOTE — Telephone Encounter (Signed)
These are waxing and waning. Please cancel future CT orders. I Can do one every year or 18 months or so

## 2018-05-04 NOTE — Telephone Encounter (Signed)
Noted by triage - unsure if there is anything we can help with at this particular time Will route back to [place] and [facility] Burton to make them aware

## 2018-05-04 NOTE — Telephone Encounter (Signed)
Order closed

## 2018-05-04 NOTE — Telephone Encounter (Signed)
Received staff message from [place], [facility]:      Pt is due for Ct in November from 12/19/17 visit but had a ct in 03/2018 do they need another one this close or should we delay it? Thanks Chantel    MR, please advise if we still need the one in Nov or if we can cancel that scan due to one previously being performed in August 2019. Thanks!

## 2018-05-07 DIAGNOSIS — R0902 Hypoxemia: Secondary | ICD-10-CM | POA: Diagnosis not present

## 2018-05-07 DIAGNOSIS — J441 Chronic obstructive pulmonary disease with (acute) exacerbation: Secondary | ICD-10-CM | POA: Diagnosis not present

## 2018-05-07 DIAGNOSIS — J449 Chronic obstructive pulmonary disease, unspecified: Secondary | ICD-10-CM | POA: Diagnosis not present

## 2018-05-18 ENCOUNTER — Other Ambulatory Visit: Payer: Self-pay | Admitting: Internal Medicine

## 2018-05-19 ENCOUNTER — Ambulatory Visit (INDEPENDENT_AMBULATORY_CARE_PROVIDER_SITE_OTHER): Payer: 59 | Admitting: Primary Care

## 2018-05-19 ENCOUNTER — Encounter: Payer: Self-pay | Admitting: Primary Care

## 2018-05-19 DIAGNOSIS — J441 Chronic obstructive pulmonary disease with (acute) exacerbation: Secondary | ICD-10-CM

## 2018-05-19 MED ORDER — PREDNISONE 10 MG PO TABS: ORAL_TABLET | ORAL | 0 refills | Status: DC

## 2018-05-19 MED ORDER — FLUTICASONE-UMECLIDIN-VILANT 100-62.5-25 MCG/INH IN AEPB: 1.0000 | INHALATION_SPRAY | Freq: Every day | RESPIRATORY_TRACT | 1 refills | Status: DC

## 2018-05-19 MED ORDER — AZITHROMYCIN 250 MG PO TABS: ORAL_TABLET | ORAL | 0 refills | Status: DC

## 2018-05-19 MED FILL — predniSONE 10 MG TABS: 10 | 8 days supply | Qty: 20 | Fill #0

## 2018-05-19 MED FILL — AZITHROMYCIN 250 MG TABLET: 250 | 5 days supply | Qty: 6 | Fill #0

## 2018-05-19 MED FILL — DALIRESP 500 MCG TABLET: 500 | 30 days supply | Qty: 30 | Fill #0

## 2018-05-19 NOTE — Assessment & Plan Note (Addendum)
-   He appears to be experiencing monthly exacerbations requiring abx and steriod treatment since about June 2019 - Change Advair/spiriva to TRELEGY 1 puff daily ( 2 week sample given) - Tx zpack and prednisone taper - Continues Daliresp 500mg  daily  - May need to consider daily azithromycin if no improvement with inhaler change - FU in 4 weeks

## 2018-05-19 NOTE — Patient Instructions (Addendum)
Stop Advair and Spiriva (hold on to)  Start Trelegy 1 puff daily             RX: Zpack and prednisone taper as prescribed  Recommendations: Azelastine nasal spray 1-2 puffs twice daily  (over the counter at any drug store) Continue flutter valve as needed for chest congestion Mucinex and delsym cough syrup twice daily   Follow-up NP in 4 weeks  Return if symptoms do not improve or worsen

## 2018-05-19 NOTE — Progress Notes (Signed)
@Patient  ID: Derek Conner, male    DOB: October 18, 1960, 57 y.o.   MRN: [id number]  Chief Complaint  Patient presents with  . Acute Visit    chest congestion, cough white to yellow mucus, wheezing x5-6 days, headache today    Referring provider: Marisue Brooklyn  HPI: 57 year old male, current everyday smoker. PMH COPD GOLD III, MAI, community acquired pneumonia, right pulmonary nodule. Patient of Dr. Marchelle Gearing, last seen by Pulmonary NP on 03/25/18 for COPD exacerbation. Maintained on Advair, Spiriva and daliresp.   COPD exacerbation: 04/29/18- Telephone call; Doxycyline and prednisone  09/19- PCP, Zpack 03/25/18-  OV; Doxycyline and prednisone 03/03/18- Telephone call; Doxycycline and prednisone  01/28/18- OV: Levaquin and prednisone  01/13/18- Telephone; Doxycycline and prednisone   05/19/2018 Patient presents today for acute visit with complaints of wheezing, cough. States he develop a cough with some colored sputum 2-3 days ago. Associated chest congestion/rattling and wheezing. Found himself using rescue inhaler/nebulizer more frequently. Still smoking 1/2 pack day. Knows he needs to quit. Received zpack from PCP in early September and found it to be helpful.    Testing: PFTs 02/15/14 show severe airflow obstruction with an FEV1 of 19% with significant bronchodilator response with a 40% improvement. 03/31/2014 Spirometry FEV1 at 26%, and mid-flows at 9% Alpha 1 >Nml/ MM    Allergies  Allergen Reactions  . Symbicort [Budesonide-Formoterol Fumarate] Shortness Of Breath, Nausea And Vomiting and Other (See Comments)    heachache  . Dulera [Mometasone Furo-Formoterol Fum] Other (See Comments)    Headache   . Humira [Adalimumab] Nausea And Vomiting    Headaches     Immunization History  Administered Date(s) Administered  . Tdap 08/29/2015    Past Medical History:  Diagnosis Date  . Asthma   . CAP (community acquired pneumonia) 01/19/2016  . Cerebral aneurysm    . Chronic bronchitis (HCC)   . COPD (chronic obstructive pulmonary disease) (HCC)   . Emphysema lung (HCC)   . Emphysema of lung (HCC)   . GERD (gastroesophageal reflux disease)   . Hepatitis A infection ~ 1980  . High cholesterol   . Hypertension   . Pneumonia 1960s  . Rheumatoid arthritis (HCC)    "hands mostly"  ( 01/19/2016)  . Situational depression 12/17/2009   "only when my mother died"  . Stroke [facility])    mini stroke when doing embolization, 12/18/2015, no deficits    Tobacco History: Social History   Tobacco Use  Smoking Status Current Every Day Smoker  . Packs/day: 1.00  . Years: 40.00  . Pack years: 40.00  . Types: Cigarettes  Smokeless Tobacco Never Used  Tobacco Comment   5 cigs per day   Ready to quit: Not Answered Counseling given: Not Answered Comment: 5 cigs per day   Outpatient Medications Prior to Visit  Medication Sig Dispense Refill  . acetaminophen (TYLENOL) 500 MG tablet Take 1,000 mg by mouth daily as needed for moderate pain or headache.    . albuterol (PROVENTIL HFA;VENTOLIN HFA) 108 (90 Base) MCG/ACT inhaler Inhale 2 puffs into the lungs every 4 (four) hours as needed for wheezing or shortness of breath.    Marland Kitchen albuterol (PROVENTIL) (2.5 MG/3ML) 0.083% nebulizer solution TAKE 3 MLS (2.5 MG TOTAL) BY NEBULIZATION EVERY 6 (SIX) HOURS AS NEEDED FOR WHEEZING OR SHORTNESS OF BREATH. 75 mL 12  . ALPRAZolam (XANAX) 0.5 MG tablet Take 1 tablet (0.5 mg total) by mouth 2 (two) times daily as needed for anxiety. 15 tablet 0  .  amLODipine (NORVASC) 10 MG tablet TAKE 1 TABLET (10 MG) BY MOUTH DAILY. 90 tablet 3  . b complex vitamins tablet Take 1 tablet by mouth daily.    . cloNIDine (CATAPRES) 0.2 MG tablet Take 1 tablet (0.2 mg total) by mouth 2 (two) times daily. 60 tablet 11  . DALIRESP 500 MCG TABS tablet TAKE 1 TABLET (500 MCG TOTAL) BY MOUTH DAILY. 30 tablet 6  . famotidine (PEPCID) 20 MG tablet TAKE 1 TABLET (20 MG TOTAL) BY MOUTH AT BEDTIME. 90 tablet 1  .  fenofibrate 54 MG tablet Take 1 tablet (54 mg total) by mouth daily. 30 tablet 2  . losartan (COZAAR) 25 MG tablet Take 1 tablet (25 mg total) by mouth daily. 30 tablet 3  . OXYGEN Inhale 2 L into the lungs at bedtime.    . pantoprazole (PROTONIX) 40 MG tablet TAKE 1 TABLET (40 MG TOTAL) BY MOUTH DAILY AT 12:00 NOON. 30 tablet 3  . ADVAIR DISKUS 500-50 MCG/DOSE AEPB INHALE 1 PUFF BY MOUTH INTO THE LUNGS 2 (TWO) TIMES DAILY. 60 each 3  . tiotropium (SPIRIVA HANDIHALER) 18 MCG inhalation capsule PLACE 1 CAPSULE INTO INHALER AND INHALE INTO LUNGS DAILY. 90 capsule 3  . azithromycin (ZITHROMAX) 250 MG tablet Take 2 tablets by mouth on day 1, followed by 1 tablet by mouth daily for 4 days. 6 tablet 0  . doxycycline (VIBRA-TABS) 100 MG tablet Take 1 tablet (100 mg total) by mouth 2 (two) times daily. 10 tablet 0  . predniSONE (DELTASONE) 10 MG tablet 40 mg daily x 2 days, then 20mg  daily x 2 days, then 10mg  daily x 2 days, then 5mg  daily x 2 days and stop 30 tablet 0   Facility-Administered Medications Prior to Visit  Medication Dose Route Frequency Provider Last Rate Last Dose  . 0.9 %  sodium chloride infusion   Intravenous Continuous , PA-C      . aspirin EC tablet 325 mg  325 mg Oral 60 min Pre-Op , PA-C      . niMODipine (NIMOTOP) capsule 0-60 mg  0-60 mg Oral 60 min Pre-Op , PA-C        Review of Systems  Review of Systems  Constitutional: Negative.   HENT: Negative.   Respiratory: Positive for cough and wheezing.   Cardiovascular: Negative.   Skin: Negative.     Physical Exam  BP 140/80 (BP Location: Left Arm, Cuff Size: Normal)   Pulse 96   Temp 98.8 F (37.1 C)   Ht 6' (1.829 m)   Wt 180 lb 12.8 oz (82 kg)   SpO2 96%   BMI 24.52 kg/m  Physical Exam  Constitutional: He is oriented to person, place, and time. He appears well-developed and well-nourished. No distress.  HENT:  Head: Normocephalic and atraumatic.  Eyes: Pupils are  equal, round, and reactive to light. EOM are normal.  Neck: Normal range of motion. Neck supple.  Cardiovascular: Normal rate and regular rhythm.  Pulmonary/Chest: Effort normal. No respiratory distress.  Mostly clear, mild rhonchi right base and exp wheeze   Musculoskeletal: Normal range of motion.  Neurological: He is alert and oriented to person, place, and time.  Skin: Skin is warm and dry.  Psychiatric: He has a normal mood and affect. His behavior is normal. Judgment and thought content normal.     Lab Results:  CBC    Component Value Date/Time   WBC 10.6 04/01/2018 1208   WBC 9.0 01/08/2018 0650  RBC 5.24 04/01/2018 1208   RBC 5.07 01/08/2018 0650   HGB 14.6 04/01/2018 1208   HCT 44.9 04/01/2018 1208   PLT 455 (H) 01/08/2018 0650   MCV 86 04/01/2018 1208   MCH 27.9 04/01/2018 1208   MCH 26.4 01/08/2018 0650   MCHC 32.5 04/01/2018 1208   MCHC 30.7 01/08/2018 0650   RDW 15.8 (H) 04/01/2018 1208   LYMPHSABS 2.7 04/01/2018 1208   MONOABS 1.0 10/29/2017 0929   EOSABS 0.1 04/01/2018 1208   BASOSABS 0.0 04/01/2018 1208    BMET    Component Value Date/Time   NA 137 04/08/2018 0721   K 4.6 04/08/2018 0721   CL 101 04/08/2018 0721   CO2 27 04/08/2018 0721   GLUCOSE 101 (H) 04/08/2018 0721   BUN 15 04/08/2018 0721   CREATININE 1.24 04/08/2018 0721   CREATININE 1.27 09/13/2016 0722   CALCIUM 10.0 04/08/2018 0721   GFRNONAA 60 (L) 01/08/2018 0650   GFRNONAA 63 09/13/2016 0722   GFRAA >60 01/08/2018 0650   GFRAA 73 09/13/2016 0722    BNP    Component Value Date/Time   BNP 17.9 05/16/2016 1523    ProBNP No results found for: PROBNP  Imaging: No results found.   Assessment & Plan:   COPD exacerbation (HCC) - He appears to be experiencing monthly exacerbations requiring abx and steriod treatment since about June 2019 - Change Advair/spiriva to TRELEGY 1 puff daily ( 2 week sample given) - Tx zpack and prednisone taper - Continues Daliresp 500mg  daily  -  May need to consider daily azithromycin if no improvement with inhaler change - FU in 4 weeks    , NP 05/19/2018

## 2018-05-20 ENCOUNTER — Telehealth: Payer: Self-pay | Admitting: Primary Care

## 2018-05-20 NOTE — Telephone Encounter (Signed)
I don't think its because of the trelegy but I would recommend going back to Advair/spiriva until he has completed zpack and prednsione. Once he is better I would recommend re-trying trelegy inhaler.

## 2018-05-20 NOTE — Telephone Encounter (Signed)
Called and spoke with pt letting him know the recommendations stated per [facility].  Pt expressed understanding. Nothing further needed.

## 2018-05-20 NOTE — Telephone Encounter (Signed)
Called and spoke with pt who stated he is wheezing more now than he was at his appt yesterday with Buelah Manis 05/19/18  Pt stated the wheezing is now very loud and is doing it more often since started the Trelegy.  Beth, please advise on this for pt and let us know what we need to do in regards to this for pt. Thanks!

## 2018-06-04 ENCOUNTER — Other Ambulatory Visit: Payer: Self-pay | Admitting: Medical

## 2018-06-04 MED FILL — ALBUTEROL 0.083% INHAL SOLN: (2.5 MG/3ML | 24 days supply | Qty: 300 | Fill #1

## 2018-06-04 MED FILL — AMLODIPINE BESYLATE 10 MG T: 10 | 90 days supply | Qty: 90 | Fill #0

## 2018-06-04 MED FILL — ADVAIR 500/50 DISKUS: 500-50 | 30 days supply | Qty: 60 | Fill #3

## 2018-06-04 MED FILL — PANTOPRAZOLE SOD DR 40 MG T: 40 | 90 days supply | Qty: 90 | Fill #0

## 2018-06-04 MED FILL — FENOFIBRATE 54 MG TABLET: 54 | 30 days supply | Qty: 30 | Fill #2

## 2018-06-07 DIAGNOSIS — J449 Chronic obstructive pulmonary disease, unspecified: Secondary | ICD-10-CM | POA: Diagnosis not present

## 2018-06-07 DIAGNOSIS — R0902 Hypoxemia: Secondary | ICD-10-CM | POA: Diagnosis not present

## 2018-06-07 DIAGNOSIS — J441 Chronic obstructive pulmonary disease with (acute) exacerbation: Secondary | ICD-10-CM | POA: Diagnosis not present

## 2018-06-16 ENCOUNTER — Ambulatory Visit (INDEPENDENT_AMBULATORY_CARE_PROVIDER_SITE_OTHER): Payer: 59 | Admitting: Primary Care

## 2018-06-16 ENCOUNTER — Encounter: Payer: Self-pay | Admitting: Primary Care

## 2018-06-16 VITALS — BP 134/70 | HR 89 | Ht 72.0 in | Wt 182.6 lb

## 2018-06-16 DIAGNOSIS — J441 Chronic obstructive pulmonary disease with (acute) exacerbation: Secondary | ICD-10-CM

## 2018-06-16 DIAGNOSIS — J479 Bronchiectasis, uncomplicated: Secondary | ICD-10-CM

## 2018-06-16 DIAGNOSIS — F17210 Nicotine dependence, cigarettes, uncomplicated: Secondary | ICD-10-CM

## 2018-06-16 MED ORDER — TIOTROPIUM BROMIDE MONOHYDRATE 18 MCG IN CAPS: 18.0000 ug | ORAL_CAPSULE | Freq: Every day | RESPIRATORY_TRACT | 6 refills | Status: DC

## 2018-06-16 MED ORDER — FLUTICASONE-SALMETEROL 500-50 MCG/DOSE IN AEPB: 1.0000 | INHALATION_SPRAY | Freq: Two times a day (BID) | RESPIRATORY_TRACT | 6 refills | Status: DC

## 2018-06-16 NOTE — Progress Notes (Addendum)
@Patient  ID: , male    DOB: [birth year], 57 y.o.   MRN: [id number]  Chief Complaint  Patient presents with  . Follow-up    breathing better    Referring provider: [id number]  HPI: 57 year old male, current everyday smoker. PMH COPD GOLD III, MAI, community acquired pneumonia, right pulmonary nodule. Patient of Dr. 58, last seen by Pulmonary NP on 03/25/18 for COPD exacerbation. Maintained on Advair, Spiriva and daliresp.   COPD exacerbations in the last year: 05/19/18- Zpack and prednisone taper  04/29/18- Telephone call; Doxycyline and prednisone  09/19- PCP, Zpack 03/25/18-  OV; Doxycyline and prednisone 03/03/18- Telephone call; Doxycycline and prednisone  01/28/18- OV: Levaquin and prednisone  01/13/18- Telephone; Doxycycline and prednisone   05/19/2018 Patient presents today for acute visit with complaints of wheezing, cough. States he develop a cough with some colored sputum 2-3 days ago. Associated chest congestion/rattling and wheezing. Found himself using rescue inhaler/nebulizer more frequently. Still smoking 1/2 pack day. Knows he needs to quit. Received zpack from PCP in early September and found it to be helpful.   06/18/2018 Patient presents today for 4 week follow-up COPD. He is doing better, has only required rescue inhaler once every 2-3 days. Has chronic cough with some production, no color. Bronchiectasis on most recent Chest CT in August 2019. He tried Trelegy for about 4 days, reports increased coughing for 2 hours after using. Currently back on Advair 500/50 and Spiriva handihaler. He is actively trying to cut back on smoking, down to 5-6 cigarettes a day from 1/2-1 pack a day. Using nicotine patch occasionally.    Testing: PFTs 02/15/14 show severe airflow obstruction with an FEV1 of 19% with significant bronchodilator response with a 40% improvement. 03/31/2014 Spirometry FEV1 at 26%, and mid-flows at 9% Alpha 1 >Nml/ MM    Allergies  Allergen Reactions  . Symbicort [Budesonide-Formoterol Fumarate] Shortness Of Breath, Nausea And Vomiting and Other (See Comments)    heachache  . Dulera [Mometasone Furo-Formoterol Fum] Other (See Comments)    Headache   . Humira [Adalimumab] Nausea And Vomiting    Headaches     Immunization History  Administered Date(s) Administered  . Tdap 08/29/2015    Past Medical History:  Diagnosis Date  . Asthma   . CAP (community acquired pneumonia) 01/19/2016  . Cerebral aneurysm   . Chronic bronchitis (HCC)   . COPD (chronic obstructive pulmonary disease) (HCC)   . Emphysema lung (HCC)   . Emphysema of lung (HCC)   . GERD (gastroesophageal reflux disease)   . Hepatitis A infection ~ 1980  . High cholesterol   . Hypertension   . Pneumonia 1960s  . Rheumatoid arthritis (HCC)    "hands mostly"  ( 01/19/2016)  . Situational depression Dec 17, 2009   "only when my mother died"  . Stroke [facility])    mini stroke when doing embolization, 12-18-15, no deficits    Tobacco History: Social History   Tobacco Use  Smoking Status Current Every Day Smoker  . Packs/day: 0.50  . Years: 40.00  . Pack years: 20.00  . Types: Cigarettes  Smokeless Tobacco Never Used  Tobacco Comment   5 cigs per day   Ready to quit: Not Answered Counseling given: Not Answered Comment: 5 cigs per day   Outpatient Medications Prior to Visit  Medication Sig Dispense Refill  . acetaminophen (TYLENOL) 500 MG tablet Take 1,000 mg by mouth daily as needed for moderate pain or headache.    . albuterol (  PROVENTIL HFA;VENTOLIN HFA) 108 (90 Base) MCG/ACT inhaler Inhale 2 puffs into the lungs every 4 (four) hours as needed for wheezing or shortness of breath.    Marland Kitchen albuterol (PROVENTIL) (2.5 MG/3ML) 0.083% nebulizer solution TAKE 3 MLS (2.5 MG TOTAL) BY NEBULIZATION EVERY 6 (SIX) HOURS AS NEEDED FOR WHEEZING OR SHORTNESS OF BREATH. 75 mL 12  . ALPRAZolam (XANAX) 0.5 MG tablet Take 1 tablet (0.5 mg total) by mouth 2  (two) times daily as needed for anxiety. 15 tablet 0  . amLODipine (NORVASC) 10 MG tablet TAKE 1 TABLET (10 MG) BY MOUTH DAILY. 90 tablet 3  . b complex vitamins tablet Take 1 tablet by mouth daily.    . cloNIDine (CATAPRES) 0.2 MG tablet Take 1 tablet (0.2 mg total) by mouth 2 (two) times daily. 60 tablet 11  . DALIRESP 500 MCG TABS tablet TAKE 1 TABLET (500 MCG TOTAL) BY MOUTH DAILY. 30 tablet 6  . famotidine (PEPCID) 20 MG tablet TAKE 1 TABLET (20 MG TOTAL) BY MOUTH AT BEDTIME. 90 tablet 1  . fenofibrate 54 MG tablet Take 1 tablet (54 mg total) by mouth daily. 30 tablet 2  . losartan (COZAAR) 25 MG tablet Take 1 tablet (25 mg total) by mouth daily. 30 tablet 3  . OXYGEN Inhale 2 L into the lungs at bedtime.    . pantoprazole (PROTONIX) 40 MG tablet TAKE 1 TABLET (40 MG TOTAL) BY MOUTH DAILY AT 12:00 NOON. 30 tablet 3  . azithromycin (ZITHROMAX) 250 MG tablet Zpack taper as directed 6 tablet 0  . predniSONE (DELTASONE) 10 MG tablet Take 4 tabs po daily x 2 days; then 3 tabs for 2 days; then 2 tabs for 2 days; then 1 tab for 2 days 20 tablet 0  . Fluticasone-Umeclidin-Vilant (TRELEGY ELLIPTA) 100-62.5-25 MCG/INH AEPB Inhale 1 puff into the lungs daily. (Patient not taking: Reported on 06/16/2018) 28 each 1   Facility-Administered Medications Prior to Visit  Medication Dose Route Frequency Provider Last Rate Last Dose  . 0.9 %  sodium chloride infusion   Intravenous Continuous Ralene Muskrat, PA-C      . aspirin EC tablet 325 mg  325 mg Oral 60 min Pre-Op Ralene Muskrat, PA-C      . niMODipine (NIMOTOP) capsule 0-60 mg  0-60 mg Oral 60 min Pre-Op Ralene Muskrat, PA-C        Review of Systems  Review of Systems  Constitutional: Negative.   Respiratory: Positive for cough. Negative for apnea, choking, chest tightness, shortness of breath and stridor.   Skin: Negative.     Physical Exam  BP 134/70 (BP Location: Right Arm, Cuff Size: Normal)   Pulse 89   Ht 6' (1.829 m)   Wt 182 lb 9.6  oz (82.8 kg)   SpO2 97%   BMI 24.77 kg/m  Physical Exam  Constitutional: He is oriented to person, place, and time. He appears well-developed and well-nourished. No distress.  HENT:  Head: Normocephalic and atraumatic.  Eyes: Pupils are equal, round, and reactive to light. EOM are normal.  Neck: Normal range of motion. Neck supple.  Cardiovascular: Normal rate and regular rhythm.  Pulmonary/Chest: Effort normal. No stridor. No respiratory distress.  Scattered rhonchi t/o. NO wheeze in lung fields.   Musculoskeletal: Normal range of motion.  Neurological: He is alert and oriented to person, place, and time.  Skin: Skin is warm and dry.  Psychiatric: He has a normal mood and affect. His behavior is normal. Judgment and thought content normal.  Lab Results:  CBC    Component Value Date/Time   WBC 10.6 04/01/2018 1208   WBC 9.0 01/08/2018 0650   RBC 5.24 04/01/2018 1208   RBC 5.07 01/08/2018 0650   HGB 14.6 04/01/2018 1208   HCT 44.9 04/01/2018 1208   PLT 455 (H) 01/08/2018 0650   MCV 86 04/01/2018 1208   MCH 27.9 04/01/2018 1208   MCH 26.4 01/08/2018 0650   MCHC 32.5 04/01/2018 1208   MCHC 30.7 01/08/2018 0650   RDW 15.8 (H) 04/01/2018 1208   LYMPHSABS 2.7 04/01/2018 1208   MONOABS 1.0 10/29/2017 0929   EOSABS 0.1 04/01/2018 1208   BASOSABS 0.0 04/01/2018 1208    BMET    Component Value Date/Time   NA 137 04/08/2018 0721   K 4.6 04/08/2018 0721   CL 101 04/08/2018 0721   CO2 27 04/08/2018 0721   GLUCOSE 101 (H) 04/08/2018 0721   BUN 15 04/08/2018 0721   CREATININE 1.24 04/08/2018 0721   CREATININE 1.27 09/13/2016 0722   CALCIUM 10.0 04/08/2018 0721   GFRNONAA 60 (L) 01/08/2018 0650   GFRNONAA 63 09/13/2016 0722   GFRAA >60 01/08/2018 0650   GFRAA 73 09/13/2016 0722    BNP    Component Value Date/Time   BNP 17.9 05/16/2016 1523    ProBNP No results found for: PROBNP  Imaging: No results found.   Assessment & Plan:   Bronchiectasis without  complication (HCC) - Chest CT in August 2019 showed bronchiectasis and mucous plugging  - Experiencing frequent COPD exacerbations, continues Daliresp 500mg  daily - No improvement with Flutter valve TID, patient states that he was unable to expectorate  mucus  - Referral Smart Vest trial for CPT twice a day  - FU in 1 month  COPD (chronic obstructive pulmonary disease) - Failed Trelegy, experienced increased coughing 2 hrs after using - Resume Advair 500/50 one puff BID and Spiriva handihaler two puffs daily  - Continue Daliresp 500mg  daily and Azelstaline nasal spray   COPD exacerbation (HCC) - Resolved after zpack and prednisone taper in October   Cigarette smoker - Actively trying to cut back on smoking, down to 5-6 cigarettes a day - Continue to encourage smoking cessation   , NP 06/18/2018

## 2018-06-16 NOTE — Assessment & Plan Note (Addendum)
-   Chest CT in August 2019 showed bronchiectasis and mucous plugging  - Experiencing frequent COPD exacerbations, continues Daliresp 500mg  daily - No improvement with Flutter valve TID, patient states that he was unable to expectorate  mucus  - Referral Smart Vest trial for CPT twice a day  - FU in 1 month

## 2018-06-16 NOTE — Assessment & Plan Note (Signed)
-   Resolved after zpack and prednisone taper in October

## 2018-06-16 NOTE — Patient Instructions (Addendum)
Continue Advair and Spiriva as ordered. Daliresp 500mg  daily. Azelastine nasal spray 1-2 puffs twice daily  (over the counter at any drug store)  Trial therapy vest twice a day for 30 days DME company is [employer]   Follow up in 1 month COPD/bronchiectasis    Bronchiectasis Bronchiectasis is a condition in which the airways (bronchi) are damaged and widened. This makes it difficult for the lungs to get rid of mucus. As a result, mucus gathers in the airways, and this often leads to lung infections. Infection can cause inflammation in the airways, which may further weaken and damage the bronchi. What are the causes? Bronchiectasis may be present at birth (congenital) or may develop later in life. Sometimes there is no apparent cause. Some common causes include:  Cystic fibrosis.  Recurrent lung infections (such as pneumonia, tuberculosis, or fungal infections).  Foreign bodies or other blockages in the lungs.  Breathing in fluid, food, or other foreign objects (aspiration).  What are the signs or symptoms? Common symptoms include:  A daily cough that brings up mucus and lasts for more than 3 weeks.  Frequent lung infections (such as pneumonia, tuberculosis, or fungal infections).  Shortness of breath and wheezing.  Weakness and fatigue.  How is this diagnosed? Various tests may be done to help diagnose bronchiectasis. Tests may include:  Chest X-rays or CT scans.  Breathing tests to help determine how your lungs are working.  Sputum cultures to check for infection.  Blood tests and other tests to check for related diseases or causes, such as cystic fibrosis.  How is this treated? Treatment varies depending on the severity of the condition. Medicines may be given to loosen the mucus to be coughed up (expectorants), to relax the muscles of the air passages (bronchodilators), or to prevent or treat infections (antibiotics). Physical therapy methods may be recommended to help  clear mucus from the lungs. For severe cases, surgery may be done to remove the affected part of the lung. Follow these instructions at home:  Get plenty of rest.  Only take over-the-counter or prescription medicines as directed by your health care provider. If antibiotic medicines were prescribed, take them as directed. Finish them even if you start to feel better.  Avoid sedatives and antihistamines unless otherwise directed by your health care provider. These medicines tend to thicken the mucus in the lungs.  Perform any breathing exercises or techniques to clear the lungs as directed by your health care provider.  Drink enough fluids to keep your urine clear or pale yellow.  Consider using a cold steam vaporizer or humidifier in your room or home to help loosen secretions.  If the cough is worse at night, try sleeping in a semi-upright position in a recliner or using a couple of pillows.  Avoid cigarette smoke and lung irritants. If you smoke, quit.  Stay inside when pollution and ozone levels are high.  Stay current with vaccinations and immunizations.  Follow up with your health care provider as directed. Contact a health care provider if:  You cough up more thick, discolored mucus (sputum) that is yellow to green in color.  You have a fever or persistent symptoms for more than 2-3 days.  You cannot control your cough and are losing sleep. Get help right away if:  You cough up blood.  You have chest pain or increasing shortness of breath.  You have pain that is getting worse or is uncontrolled with medicines.  You have a fever and your  symptoms suddenly get worse. This information is not intended to replace advice given to you by your health care provider. Make sure you discuss any questions you have with your health care provider. Document Released: 05/19/2007 Document Revised: 01/03/2016 Document Reviewed: 01/27/2013 Elsevier Interactive Patient Education  2017  [employer].

## 2018-06-16 NOTE — Assessment & Plan Note (Signed)
-   Failed Trelegy, experienced increased coughing 2 hrs after using - Resume Advair 500/50 one puff BID and Spiriva handihaler two puffs daily  - Continue Daliresp 500mg  daily and Azelstaline nasal spray

## 2018-06-16 NOTE — Assessment & Plan Note (Signed)
-   Actively trying to cut back on smoking, down to 5-6 cigarettes a day - Continue to encourage smoking cessation

## 2018-06-17 ENCOUNTER — Telehealth: Payer: Self-pay | Admitting: Primary Care

## 2018-06-17 DIAGNOSIS — J479 Bronchiectasis, uncomplicated: Secondary | ICD-10-CM

## 2018-06-17 MED FILL — DALIRESP 500 MCG TABLET: 500 | 30 days supply | Qty: 30 | Fill #1

## 2018-06-18 ENCOUNTER — Telehealth: Payer: Self-pay | Admitting: Primary Care

## 2018-06-18 MED ORDER — AZELASTINE HCL 0.1 % NA SOLN: 2.0000 | Freq: Two times a day (BID) | NASAL | 1 refills | Status: DC

## 2018-06-18 MED FILL — AZELASTINE HCL 137 MCG/SPRA: 137 | 25 days supply | Qty: 30 | Fill #0

## 2018-06-18 NOTE — Telephone Encounter (Signed)
Order placed to D/C. Nothing further is needed at this time.

## 2018-06-18 NOTE — Telephone Encounter (Signed)
Spoke with pt and advised him that I would send in the astelin in as a Rx to his pharmacy. Pt understood and Rx sent in. Nothing further is needed.    - Continue Daliresp 500mg  daily and Azelstaline nasal spray     Patient Instructions by , NP at 06/16/2018 2:00 PM  Author: 13/07/2018, NP Author Type: Nurse Practitioner Filed: 06/16/2018 2:30 PM  Note Status: Addendum Cosign: Cosign Not Required Encounter Date: 06/16/2018  Editor: 13/07/2018, NP (Nurse Practitioner)  Prior Versions: 1. Glenford Bayley, NP (Nurse Practitioner) at 06/16/2018 2:28 PM - Addendum   2. 13/07/2018, NP (Nurse Practitioner) at 06/16/2018 2:27 PM - Addendum   3. 13/07/2018, NP (Nurse Practitioner) at 06/16/2018 2:21 PM - Signed     Continue Advair and Spiriva as ordered. Daliresp 500mg  daily. Azelastine nasal spray 1-2 puffs twice daily (over the counter at any drug store)  Trial therapy vest twice a day for 30 days DME company is [employer]   Follow up in 1 month COPD/bronchiectasis

## 2018-06-18 NOTE — Telephone Encounter (Signed)
Called and spoke to patient, patient states he does want to D/C order for therapy vest and is not interested at this time. States he is aware of benefits and still does not want it.   EW please advise if okay to place order to D/V therapy vest.

## 2018-06-18 NOTE — Telephone Encounter (Signed)
Fine. Ok to discontinue.

## 2018-06-18 NOTE — Telephone Encounter (Signed)
I spoke with him and he initially agreed to smart vest. I really think he could benefit from it and highly recommend he try using it. Flutter valve did not help patient to expectorate mucus. Will add that addendum now.

## 2018-06-18 NOTE — Telephone Encounter (Signed)
Called and spoke with Melissa with [facility] who stated she needed Buelah Manis, NP to do an addendum on pt's OV from 06/16/18 to explain more about why pt failed with the flutter valve instead of just stating that he had no improvement with using the flutter valve TID.  While speaking with Melissa, she stated pt had called the office and told someone at [facility] that he did not want the aflovest but per [facility] after speaking with [facility] and reviewing pt's chart, pt is a perfect candidate for the aflovest.  Melissa wants to know if someone from our office wants to try to call and talk with pt to discuss more about the aflovest and to why pt would really benefit from the use of it.  Beth, please advise on this if you want to call pt to discuss this with him or what you want [place] to do. Also, please do an addendum to your OV note so [facility] can be able to pull that to send it off for pt.

## 2018-07-06 ENCOUNTER — Encounter: Payer: Self-pay | Admitting: Pulmonary Disease

## 2018-07-06 ENCOUNTER — Ambulatory Visit (INDEPENDENT_AMBULATORY_CARE_PROVIDER_SITE_OTHER): Payer: 59 | Admitting: Pulmonary Disease

## 2018-07-06 ENCOUNTER — Ambulatory Visit (INDEPENDENT_AMBULATORY_CARE_PROVIDER_SITE_OTHER)
Admission: RE | Admit: 2018-07-06 | Discharge: 2018-07-06 | Disposition: A | Discharge status: Home / Self Care | Payer: 59 | Source: Ambulatory Visit | Attending: Pulmonary Disease | Admitting: Pulmonary Disease

## 2018-07-06 ENCOUNTER — Telehealth: Payer: Self-pay | Admitting: Primary Care

## 2018-07-06 VITALS — BP 144/78 | HR 89

## 2018-07-06 DIAGNOSIS — J441 Chronic obstructive pulmonary disease with (acute) exacerbation: Secondary | ICD-10-CM

## 2018-07-06 DIAGNOSIS — J479 Bronchiectasis, uncomplicated: Secondary | ICD-10-CM

## 2018-07-06 DIAGNOSIS — F17210 Nicotine dependence, cigarettes, uncomplicated: Secondary | ICD-10-CM | POA: Diagnosis not present

## 2018-07-06 DIAGNOSIS — F17200 Nicotine dependence, unspecified, uncomplicated: Secondary | ICD-10-CM

## 2018-07-06 MED ORDER — IPRATROPIUM-ALBUTEROL 0.5-2.5 (3) MG/3ML IN SOLN
3.0000 mL | Freq: Once | RESPIRATORY_TRACT | Status: AC
  Administered 2018-07-06: 3 mL via RESPIRATORY_TRACT

## 2018-07-06 MED ORDER — AMOXICILLIN-POT CLAVULANATE 875-125 MG PO TABS: 1.0000 | ORAL_TABLET | Freq: Two times a day (BID) | ORAL | 0 refills | Status: DC

## 2018-07-06 MED ORDER — PREDNISONE 10 MG PO TABS: ORAL_TABLET | ORAL | 0 refills | Status: DC

## 2018-07-06 MED FILL — predniSONE 10 MG TABS: 10 | 8 days supply | Qty: 20 | Fill #0

## 2018-07-06 MED FILL — ADVAIR 500/50 DISKUS: 500-50 | 30 days supply | Qty: 60 | Fill #0

## 2018-07-06 MED FILL — AMOX-CLAV 875-125 MG TABLET: 875-125 | 7 days supply | Qty: 14 | Fill #0

## 2018-07-06 NOTE — Assessment & Plan Note (Signed)
Augmentin >>> Take 1 875-125 mg tablet every 12 hours for the next 7 days >>> Take with food  Prednisone 10mg  tablet  >>>4 tabs for 2 days, then 3 tabs for 2 days, 2 tabs for 2 days, then 1 tab for 2 days, then stop >>>take with food  >>>take in the morning   Sputum sample  >>>three way: fungal, resp sputum, and AFB  Duoneb today   Chest Xray today   Continue Advair 500 Rinse mouth out after use  Continue Spiriva HandiHaler  We will bring him back to see Dr. at his first available office visits that you can discuss further management of COPD  Note your daily symptoms > remember "red flags" for COPD:   >>>Increase in cough >>>increase in sputum production >>>increase in shortness of breath or activity  intolerance.   If you notice these symptoms, please call the office to be seen.   We recommend that you stop smoking.

## 2018-07-06 NOTE — Assessment & Plan Note (Signed)
  We recommend that you stop smoking.  >>>You need to set a quit date >>>If you have friends or family who smoke, let them know you are trying to quit and not to smoke around you or in your living environment  Smoking Cessation Resources:  1 800 QUIT NOW  >>> Patient to call this resource and utilize it to help support her quit smoking >>> Keep up your hard work with stopping smoking  You can also contact the [facility] >>>For smoking cessation classes call [phone]  We do not recommend using e-cigarettes as a form of stopping smoking  You can sign up for smoking cessation support texts and information:  >>>https://smokefree.gov/smokefreetxt     Nicotine patches: >>>Make sure you rotate sites that you do not get skin irritation, Apply 1 patch each morning to a non-hairy skin site  If you are smoking greater than 10 cigarettes/day and weigh over 45 kg start with the nicotine patch of 21 mg a day for 6 weeks, then 14 mg a day for 2 weeks, then finished with 7 mg a day for 2 weeks, then stop  If you are smoking less than 10 cigarettes a day or weight less than 45 kg start with medium dose pack of 14 mg a day for 6 weeks, followed by 7 mg a day for 2 weeks   >>>If insomnia occurs you are having trouble sleeping you can take the patch off at night, and place a new one on in the morning >>>If the patch is removed at night and you have morning cravings start short acting nicotine replacement therapy such as gum or lozenges  Nicotine Gum:  >>>Smokers who smoke 25 or more cigarettes a day should use 4 mg dose >>>Smokers who smoke fewer than 25 cigarettes a day should use 2 mg dose  Proper chewing of gum is important for optimal results.   >>>Do not chew gum to rapidly.  Once you start chewing eating tasty peppery taste and slide the gum to your cheek.  When the taste disappears to a few more times.  Repeat this for 30 minutes.  Then discard the gum.  >>>Avoid acidic  beverages such as coffee, carbonated beverages before and during gum use.  A soft acidic beverages lower oral pH which cause nicotine to not be absorbed properly >>>If you chew the gum too quickly or vigorously you could have nausea, vomiting, abdominal pain, constipation, hiccups, headache, sore jaw, mouth irritation ulcers  Nicotine lozenge: Lozenges are commonly uses short acting NRT product  >>>Smokers who smoke within 30 minutes of awakening should use 4 mg dose >>>Smokers who wait more than 30 minutes after awakening to smoke should use 2 mg dose  Can use up to 1 lozenge every 1-2 hours for 6 weeks >>>Total amount of lozenges that can be used per day as 20 >>>Gradually reduce number of lozenges used per day after 2 weeks of use  Place lozenge in mouth and allowed to dissolve for 30 minutes loss and does not need to be chewed  Lozenges have advantages to be able to be used in people with TMG, poor dentition, dentures  

## 2018-07-06 NOTE — Telephone Encounter (Signed)
lmtcb for pt.  

## 2018-07-06 NOTE — Patient Instructions (Addendum)
Augmentin >>> Take 1 875-125 mg tablet every 12 hours for the next 7 days >>> Take with food  Prednisone 10mg  tablet  >>>4 tabs for 2 days, then 3 tabs for 2 days, 2 tabs for 2 days, then 1 tab for 2 days, then stop >>>take with food  >>>take in the morning   Sputum sample  >>>three way: fungal, resp sputum, and AFB  Duoneb today   Chest Xray today   Continue Advair 500 Rinse mouth out after use  Continue Spiriva HandiHaler  Bronchiectasis: This is the medical term which indicates that you have damage, dilated airways making you more susceptible to respiratory infection. Use a flutter valve 10 breaths twice a day or 4 to 5 breaths 4-5 times a day to help clear mucus out Let know if you have cough with change in mucus color or fevers or chills.  At that point you would need an antibiotic. Maintain a healthy nutritious diet, eating whole foods Take your medications as prescribed    We will bring him back to see Dr. [place] at his first available office visits that you can discuss further management of COPD   Note your daily symptoms > remember "red flags" for COPD:   >>>Increase in cough >>>increase in sputum production >>>increase in shortness of breath or activity  intolerance.   If you notice these symptoms, please call the office to be seen.     We recommend that you stop smoking.  >>>You need to set a quit date >>>If you have friends or family who smoke, let them know you are trying to quit and not to smoke around you or in your living environment  Smoking Cessation Resources:  1 800 QUIT NOW  >>> Patient to call this resource and utilize it to help support her quit smoking >>> Keep up your hard work with stopping smoking  You can also contact the [facility] >>>For smoking cessation classes call [phone]  We do not recommend using e-cigarettes as a form of stopping smoking  You can sign up for smoking cessation support texts and  information:  >>>https://smokefree.gov/smokefreetxt  Nicotine patches: >>>Make sure you rotate sites that you do not get skin irritation, Apply 1 patch each morning to a non-hairy skin site  If you are smoking greater than 10 cigarettes/day and weigh over 45 kg start with the nicotine patch of 21 mg a day for 6 weeks, then 14 mg a day for 2 weeks, then finished with 7 mg a day for 2 weeks, then stop  If you are smoking less than 10 cigarettes a day or weight less than 45 kg start with medium dose pack of 14 mg a day for 6 weeks, followed by 7 mg a day for 2 weeks   >>>If insomnia occurs you are having trouble sleeping you can take the patch off at night, and place a new one on in the morning >>>If the patch is removed at night and you have morning cravings start short acting nicotine replacement therapy such as gum or lozenges  Nicotine Gum:  >>>Smokers who smoke 25 or more cigarettes a day should use 4 mg dose >>>Smokers who smoke fewer than 25 cigarettes a day should use 2 mg dose  Proper chewing of gum is important for optimal results.   >>>Do not chew gum to rapidly.  Once you start chewing eating tasty peppery taste and slide the gum to your cheek.  When the taste disappears to a  few more times.  Repeat this for 30 minutes.  Then discard the gum.  >>>Avoid acidic beverages such as coffee, carbonated beverages before and during gum use.  A soft acidic beverages lower oral pH which cause nicotine to not be absorbed properly >>>If you chew the gum too quickly or vigorously you could have nausea, vomiting, abdominal pain, constipation, hiccups, headache, sore jaw, mouth irritation ulcers  Nicotine lozenge: Lozenges are commonly uses short acting NRT product  >>>Smokers who smoke within 30 minutes of awakening should use 4 mg dose >>>Smokers who wait more than 30 minutes after awakening to smoke should use 2 mg dose  Can use up to 1 lozenge every 1-2 hours for 6 weeks >>>Total amount  of lozenges that can be used per day as 20 >>>Gradually reduce number of lozenges used per day after 2 weeks of use  Place lozenge in mouth and allowed to dissolve for 30 minutes loss and does not need to be chewed  Lozenges have advantages to be able to be used in people with TMG, poor dentition, dentures     It is flu season:   >>>Remember to be washing your hands regularly, using hand sanitizer, be careful to use around herself with has contact with people who are sick will increase her chances of getting sick yourself. >>> Best ways to protect herself from the flu: Receive the yearly flu vaccine, practice good hand hygiene washing with soap and also using hand sanitizer when available, eat a nutritious meals, get adequate rest, hydrate appropriately   Please contact the office if your symptoms worsen or you have concerns that you are not improving.   Thank you for choosing [facility] for your healthcare, and for allowing [place] to partner with you on your healthcare journey. I am thankful to be able to provide care to you today.   Elisha Headland FNP-C     Chronic Obstructive Pulmonary Disease Exacerbation Chronic obstructive pulmonary disease (COPD) is a common lung problem. In COPD, the flow of air from the lungs is limited. COPD exacerbations are times that breathing gets worse and you need extra treatment. Without treatment they can be life threatening. If they happen often, your lungs can become more damaged. If your COPD gets worse, your doctor may treat you with:  Medicines.  Oxygen.  Different ways to clear your airway, such as using a mask.  Follow these instructions at home:  Do not smoke.  Avoid tobacco smoke and other things that bother your lungs.  If given, take your antibiotic medicine as told. Finish the medicine even if you start to feel better.  Only take medicines as told by your doctor.  Drink enough fluids to keep your pee (urine) clear or pale  yellow (unless your doctor has told you not to).  Use a cool mist machine (vaporizer).  If you use oxygen or a machine that turns liquid medicine into a mist (nebulizer), continue to use them as told.  Keep up with shots (vaccinations) as told by your doctor.  Exercise regularly.  Eat healthy foods.  Keep all doctor visits as told. Get help right away if:  You are very short of breath and it gets worse.  You have trouble talking.  You have bad chest pain.  You have blood in your spit (sputum).  You have a fever.  You keep throwing up (vomiting).  You feel weak, or you pass out (faint).  You feel confused.  You keep getting worse.  This information is not intended to replace advice given to you by your health care provider. Make sure you discuss any questions you have with your health care provider. Document Released: 07/11/2011 Document Revised: 12/28/2015 Document Reviewed: 03/26/2013 Elsevier Interactive Patient Education  2017 [employer]    Coping with Quitting Smoking Quitting smoking is a physical and mental challenge. You will face cravings, withdrawal symptoms, and temptation. Before quitting, work with your health care provider to make a plan that can help you cope. Preparation can help you quit and keep you from giving in. How can I cope with cravings? Cravings usually last for 5-10 minutes. If you get through it, the craving will pass. Consider taking the following actions to help you cope with cravings:  Keep your mouth busy: ? Chew sugar-free gum. ? Suck on hard candies or a straw. ? Brush your teeth.  Keep your hands and body busy: ? Immediately change to a different activity when you feel a craving. ? Squeeze or play with a ball. ? Do an activity or a hobby, like making bead jewelry, practicing needlepoint, or working with wood. ? Mix up your normal routine. ? Take a short exercise break. Go for a quick walk or run up and down stairs. ? Spend  time in public places where smoking is not allowed.  Focus on doing something kind or helpful for someone else.  Call a friend or family member to talk during a craving.  Join a support group.  Call a quit line, such as 1-800-QUIT-NOW.  Talk with your health care provider about medicines that might help you cope with cravings and make quitting easier for you.  How can I deal with withdrawal symptoms? Your body may experience negative effects as it tries to get used to not having nicotine in the system. These effects are called withdrawal symptoms. They may include:  Feeling hungrier than normal.  Trouble concentrating.  Irritability.  Trouble sleeping.  Feeling depressed.  Restlessness and agitation.  Craving a cigarette.  To manage withdrawal symptoms:  Avoid places, people, and activities that trigger your cravings.  Remember why you want to quit.  Get plenty of sleep.  Avoid coffee and other caffeinated drinks. These may worsen some of your symptoms.  How can I handle social situations? Social situations can be difficult when you are quitting smoking, especially in the first few weeks. To manage this, you can:  Avoid parties, bars, and other social situations where people might be smoking.  Avoid alcohol.  Leave right away if you have the urge to smoke.  Explain to your family and friends that you are quitting smoking. Ask for understanding and support.  Plan activities with friends or family where smoking is not an option.  What are some ways I can cope with stress? Wanting to smoke may cause stress, and stress can make you want to smoke. Find ways to manage your stress. Relaxation techniques can help. For example:  Breathe slowly and deeply, in through your nose and out through your mouth.  Listen to soothing, relaxing music.  Talk with a family member or friend about your stress.  Light a candle.  Soak in a bath or take a shower.  Think about a  peaceful place.  What are some ways I can prevent weight gain? Be aware that many people gain weight after they quit smoking. However, not everyone does. To keep from gaining weight, have a plan in place before you quit and stick to  the plan after you quit. Your plan should include:  Having healthy snacks. When you have a craving, it may help to: ? Eat plain popcorn, crunchy carrots, celery, or other cut vegetables. ? Chew sugar-free gum.  Changing how you eat: ? Eat small portion sizes at meals. ? Eat 4-6 small meals throughout the day instead of 1-2 large meals a day. ? Be mindful when you eat. Do not watch television or do other things that might distract you as you eat.  Exercising regularly: ? Make time to exercise each day. If you do not have time for a long workout, do short bouts of exercise for 5-10 minutes several times a day. ? Do some form of strengthening exercise, like weight lifting, and some form of aerobic exercise, like running or swimming.  Drinking plenty of water or other low-calorie or no-calorie drinks. Drink 6-8 glasses of water daily, or as much as instructed by your health care provider.  Summary  Quitting smoking is a physical and mental challenge. You will face cravings, withdrawal symptoms, and temptation to smoke again. Preparation can help you as you go through these challenges.  You can cope with cravings by keeping your mouth busy (such as by chewing gum), keeping your body and hands busy, and making calls to family, friends, or a helpline for people who want to quit smoking.  You can cope with withdrawal symptoms by avoiding places where people smoke, avoiding drinks with caffeine, and getting plenty of rest.  Ask your health care provider about the different ways to prevent weight gain, avoid stress, and handle social situations. This information is not intended to replace advice given to you by your health care provider. Make sure you discuss any  questions you have with your health care provider. Document Released: 07/19/2016 Document Revised: 07/19/2016 Document Reviewed: 07/19/2016 Elsevier Interactive Patient Education  [employer].

## 2018-07-06 NOTE — Progress Notes (Signed)
@Patient  ID: Derek Conner, male    DOB: 01-28-1961, 57 y.o.   MRN: [id number]  Chief Complaint  Patient presents with  . Acute Visit    Cough    Referring provider: Marisue Brooklyn  HPI:  57 year old male current every day smoker followed in our office for COPD Gold 3, MAI, community-acquired pneumonia, right pulmonary nodule  Smoker/ Smoking History: Current Everyday Smoker.  Maintenance:  Advair 500, Spiriva 2.5, Daliresp Pt of: Dr. Marchelle Gearing  07/06/2018  - Visit   57 year old male current every day smoker presenting to our office for acute visit.  Patient reports that 3 days ago cough increased to became productive with green mucus.  Patient reports he is also had increased headache.  Patient says that he is felt sick, chills, subjective fevers, increased fatigue patient reports that prior to the symptoms he had been doing quite well for the last 5 to 6 weeks.  Patient continues to be adherent to Advair 500 as well as Spiriva 2.5 and Daliresp.  Patient reports having to use his rescue inhaler about 2-3 times a day and using his nebulized albuterol one time a day.  Unfortunately patient continues to smoke about a half a pack of cigarettes a day.  Patient reports that he was using nicotine patches but this started to make him have skin irritation.  Patient is still interested in stopping smoking  Per chart review patient has had a significant amount of antibiotics for COPD exacerbations over the past 6 months.   Tests:  PFTs 02/15/14 show severe airflow obstruction with an FEV1 of 19% with significant bronchodilator response with a 40% improvement.  03/31/2014 Spirometry FEV1 at 26%  Alpha 1 >Nml/ MM   04/10/2018-chest x-ray- lungs are hyperinflated, persistent nodules in upper lobes bilaterally with a low right nodule.  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, moderate centrilobular and paraseptal emphysema   01/28/18-Mycobacterium-no acid-fast bacilli seen   05/17/2016-respiratory culture- consistent with normal respiratory flora  FENO:  No results found for: NITRICOXIDE  PFT: PFT Results Latest Ref Rng & Units 10/25/2016 02/15/2014  FVC-Pre L 3.24 2.17  FVC-Predicted Pre % 63 41  FVC-Post L - 3.10  FVC-Predicted Post % - 59  Pre FEV1/FVC % % 43 36  Post FEV1/FCV % % - 36  FEV1-Pre L 1.38 0.78  FEV1-Predicted Pre % 35 19  FEV1-Post L - 1.10  DLCO UNC% % 72 73  DLCO COR %Predicted % 65 68  TLC L - 9.36  TLC % Predicted % - 128  RV % Predicted % - 262    Imaging: Dg Chest 2 View  Result Date: 07/06/2018 CLINICAL DATA:  Wheezing.  Cough. EXAM: CHEST - 2 VIEW COMPARISON:  April 10, 2018 FINDINGS: The upper lobe nodularity persists but appears mildly improved. Again noted is a cavitary nodule in the right upper lobe. No new nodules or masses. The cardiomediastinal silhouette is unremarkable. Hyperexpansion of the lungs consistent with COPD or emphysema. No other acute abnormalities. IMPRESSION: Persistent but improved nodularity in the apices, including a cavitary nodule in the right upper lobe. Recommend follow-up to complete resolution. Emphysema. Electronically Signed   By: Gerome Sam III M.D   On: 07/06/2018 15:56  Chart Review:   COPD exacerbations in the last year: 05/19/18- Zpack and prednisone taper  04/29/18- Telephone call; Doxycyline and prednisone  09/19- PCP, Zpack 03/25/18-  OV; Doxycyline and prednisone 03/03/18- Telephone call; Doxycycline and prednisone  01/28/18- OV: Levaquin and prednisone  01/13/18- Telephone; Doxycycline and prednisone   Specialty Problems       Pulmonary Problems   Acute bronchitis   COPD GOLD IV D    GOLD D - PFTs  02/15/14 show severe airflow obstruction with an FEV1 of 19% with significant bronchodilator response with a 40% improvement. 03/31/2014  Spirometry FEV1 at 26%, and mid-flows at 9% Alpha 1 >Nml/ MM       Hemoptysis   Pulmonary nodule, right   COPD, severity to be determined (HCC)   Nodule of right lung    11/22/2016 Lung-RADS Category 4A, suspicious. Follow up low-dose chest CT without contrast in 3 months (please use the following order, "CT CHEST LCS NODULE FOLLOW-UP W/O CM") is recommended. Alternatively, PET may be considered when there is a solid component 71mm or larger.       COPD, severe (HCC)   COPD exacerbation (HCC)    Alpha one  03/26/16 :   MM PFT's  10/25/2016  FEV1 1.38 (35 % ) ratio 43  p ?  prior to study with DLCO  72 % corrects to 65  % for alv volume   With marked air trapping on lung volumes  - 11/15/2016  After extensive coaching HFA effectiveness =    90%       Chronic obstructive pulmonary disease with acute exacerbation (HCC)    Resolved after recent treatment Plan: Continue spiriva/daliresp/advair 500 Rinse after use Please try to quit smoking. Note your daily symptoms >remember "red flags" for COPD: Increase in cough, increase in sputum production, increase in shortness of breath or activity tolerance. If you notice these symptoms, please call to be seen.  Continue wearing oxygen at night at 2 L Get [place] nasal gel We will place an order to humidify oxygen with your DME. Follow up with Dr. Marchelle Gearing or Maralyn Sago NP for operative clearance for hip surgery near the end of January. Please contact office for sooner follow up if symptoms do not improve or worsen or seek emergency care  Have a Happy Holiday        Community acquired pneumonia   Nocturnal hypoxemia   Bronchiectasis without complication (HCC)      Allergies  Allergen Reactions  . Symbicort [Budesonide-Formoterol  Fumarate] Shortness Of Breath, Nausea And Vomiting and Other (See Comments)    heachache  . Dulera [Mometasone Furo-Formoterol Fum] Other (See Comments)    Headache   . Humira [Adalimumab] Nausea And Vomiting    Headaches     Immunization History  Administered Date(s) Administered  . Tdap 08/29/2015    Past Medical History:  Diagnosis Date  . Asthma   . CAP (community acquired pneumonia) 01/19/2016  . Cerebral aneurysm   . Chronic bronchitis (HCC)   . COPD (chronic obstructive pulmonary disease) (HCC)   . Emphysema lung (HCC)   . Emphysema of lung (HCC)   . GERD (gastroesophageal reflux disease)   . Hepatitis A infection ~ 1980  . High cholesterol   . Hypertension   . Pneumonia 1960s  . Rheumatoid arthritis (HCC)    "hands mostly"  ( 01/19/2016)  . Situational depression 14-Dec-2009   "only when my mother died"  . Stroke [facility])    mini stroke when doing  embolization, 2017, no deficits    Tobacco History: Social History   Tobacco Use  Smoking Status Current Every Day Smoker  . Packs/day: 0.50  . Years: 40.00  . Pack years: 20.00  . Types: Cigarettes  Smokeless Tobacco Never Used  Tobacco Comment   8-10 per day (07-06-2018)   Ready to quit: Not Answered Counseling given: Yes Comment: 8-10 per day (07-06-2018)  Smoking assessment and cessation counseling  Patient currently smoking: 8-10 cigarettes  I have advised the patient to quit/stop smoking as soon as possible due to high risk for multiple medical problems.  It will also be very difficult for [place] to manage patient's  respiratory symptoms and status if we continue to expose her lungs to a known irritant.  We do not advise e-cigarettes as a form of stopping smoking.  Patient is willing to quit smoking.  Patient has not set a quit date  I have advised the patient that we can assist and have options of nicotine replacement therapy, provided smoking cessation education today, provided smoking cessation counseling, and  provided cessation resources.  Patient has attempted to use Chantix and had a poor reaction.  Patient reports he would does not want to use that again.  Patient reports that he also tried Wellbutrin but he did not like how he felt on it.  Patient does not want to consider Wellbutrin or Chantix as cessation aids in the future.  Patient is open to using nicotine replacement therapies.   Follow-up next office visit office visit for assessment of smoking cessation.  Smoking cessation counseling advised for: 6 min   Outpatient Encounter Medications as of 07/06/2018  Medication Sig  . acetaminophen (TYLENOL) 500 MG tablet Take 1,000 mg by mouth daily as needed for moderate pain or headache.  . albuterol (PROVENTIL HFA;VENTOLIN HFA) 108 (90 Base) MCG/ACT inhaler Inhale 2 puffs into the lungs every 4 (four) hours as needed for wheezing or shortness of breath.  Marland Kitchen albuterol (PROVENTIL) (2.5 MG/3ML) 0.083% nebulizer solution TAKE 3 MLS (2.5 MG TOTAL) BY NEBULIZATION EVERY 6 (SIX) HOURS AS NEEDED FOR WHEEZING OR SHORTNESS OF BREATH.  Marland Kitchen ALPRAZolam (XANAX) 0.5 MG tablet Take 1 tablet (0.5 mg total) by mouth 2 (two) times daily as needed for anxiety.  Marland Kitchen amLODipine (NORVASC) 10 MG tablet TAKE 1 TABLET (10 MG) BY MOUTH DAILY.  Marland Kitchen azelastine (ASTELIN) 0.1 % nasal spray Place 2 sprays into both nostrils 2 (two) times daily. Use in each nostril as directed  . b complex vitamins tablet Take 1 tablet by mouth daily.  . cloNIDine (CATAPRES) 0.2 MG tablet Take 1 tablet (0.2 mg total) by mouth 2 (two) times daily.  Marland Kitchen DALIRESP 500 MCG TABS tablet TAKE 1 TABLET (500 MCG TOTAL) BY MOUTH DAILY.  . famotidine (PEPCID) 20 MG tablet TAKE 1 TABLET (20 MG TOTAL) BY MOUTH AT BEDTIME.  . fenofibrate 54 MG tablet Take 1 tablet (54 mg total) by mouth daily.  . Fluticasone-Salmeterol (ADVAIR DISKUS) 500-50 MCG/DOSE AEPB Inhale 1 puff into the lungs 2 (two) times daily.  Marland Kitchen losartan (COZAAR) 25 MG tablet Take 1 tablet (25 mg total) by  mouth daily.  . pantoprazole (PROTONIX) 40 MG tablet TAKE 1 TABLET (40 MG TOTAL) BY MOUTH DAILY AT 12:00 NOON.  Marland Kitchen tiotropium (SPIRIVA) 18 MCG inhalation capsule Place 1 capsule (18 mcg total) into inhaler and inhale daily.  Marland Kitchen amoxicillin-clavulanate (AUGMENTIN) 875-125 MG tablet Take 1 tablet by mouth 2 (two) times daily.  . OXYGEN Inhale 2 L  into the lungs at bedtime.  . predniSONE (DELTASONE) 10 MG tablet 4 tabs for 2 days, then 3 tabs for 2 days, 2 tabs for 2 days, then 1 tab for 2 days, then stop   Facility-Administered Encounter Medications as of 07/06/2018  Medication  . 0.9 %  sodium chloride infusion  . aspirin EC tablet 325 mg  . [COMPLETED] ipratropium-albuterol (DUONEB) 0.5-2.5 (3) MG/3ML nebulizer solution 3 mL  . niMODipine (NIMOTOP) capsule 0-60 mg     Review of Systems  Review of Systems  Constitutional: Positive for activity change, appetite change (diminished appetite ), chills, fatigue and fever. Negative for unexpected weight change.  HENT: Negative for postnasal drip, rhinorrhea, sinus pressure, sinus pain, sneezing and sore throat.   Eyes: Negative.   Respiratory: Positive for cough, shortness of breath (after coughing ) and wheezing.   Cardiovascular: Negative for chest pain and palpitations.  Gastrointestinal: Negative for constipation, diarrhea, nausea and vomiting.  Endocrine: Negative.   Musculoskeletal: Negative.   Skin: Negative.   Neurological: Positive for headaches. Negative for dizziness.  Psychiatric/Behavioral: Negative.  Negative for dysphoric mood. The patient is not nervous/anxious.   All other systems reviewed and are negative.    Physical Exam  BP (!) 144/78 (BP Location: Left Arm, Cuff Size: Normal)   Pulse 89   SpO2 98%   Wt Readings from Last 5 Encounters:  06/16/18 182 lb 9.6 oz (82.8 kg)  05/19/18 180 lb 12.8 oz (82 kg)  04/10/18 177 lb 12.8 oz (80.6 kg)  03/25/18 179 lb 9.6 oz (81.5 kg)  03/25/18 179 lb 6.4 oz (81.4 kg)     Physical Exam  Constitutional: He is oriented to person, place, and time and well-developed, well-nourished, and in no distress. No distress.  HENT:  Head: Normocephalic and atraumatic.  Right Ear: Hearing, tympanic membrane, external ear and ear canal normal.  Left Ear: Hearing, tympanic membrane, external ear and ear canal normal.  Nose: Mucosal edema present. Right sinus exhibits no maxillary sinus tenderness and no frontal sinus tenderness. Left sinus exhibits no maxillary sinus tenderness and no frontal sinus tenderness.  Mouth/Throat: Uvula is midline and oropharynx is clear and moist. No oropharyngeal exudate.  + Postnasal drip  Eyes: Pupils are equal, round, and reactive to light.  Neck: Normal range of motion. Neck supple.  Cardiovascular: Normal rate, regular rhythm and normal heart sounds.  Pulmonary/Chest: Effort normal. No accessory muscle usage. No respiratory distress. He has no decreased breath sounds. He has wheezes (Inspiratory and expiratory wheeze throughout). He has no rhonchi.  Musculoskeletal: Normal range of motion. He exhibits no edema.  Lymphadenopathy:    He has no cervical adenopathy.  Neurological: He is alert and oriented to person, place, and time. Gait normal.  Skin: Skin is warm and dry. He is not diaphoretic. No erythema.  Psychiatric: Mood, memory, affect and judgment normal.  Nursing note and vitals reviewed.   Breathing treatment in office Respiratory: Resolved inspiratory wheeze, persistent expiratory wheeze which has somewhat improved  Lab Results:  CBC    Component Value Date/Time   WBC 10.6 04/01/2018 1208   WBC 9.0 01/08/2018 0650   RBC 5.24 04/01/2018 1208   RBC 5.07 01/08/2018 0650   HGB 14.6 04/01/2018 1208   HCT 44.9 04/01/2018 1208   PLT 455 (H) 01/08/2018 0650   MCV 86 04/01/2018 1208   MCH 27.9 04/01/2018 1208   MCH 26.4 01/08/2018 0650   MCHC 32.5 04/01/2018 1208   MCHC 30.7 01/08/2018 0650  RDW 15.8 (H) 04/01/2018 1208    LYMPHSABS 2.7 04/01/2018 1208   MONOABS 1.0 10/29/2017 0929   EOSABS 0.1 04/01/2018 1208   BASOSABS 0.0 04/01/2018 1208    BMET    Component Value Date/Time   NA 137 04/08/2018 0721   K 4.6 04/08/2018 0721   CL 101 04/08/2018 0721   CO2 27 04/08/2018 0721   GLUCOSE 101 (H) 04/08/2018 0721   BUN 15 04/08/2018 0721   CREATININE 1.24 04/08/2018 0721   CREATININE 1.27 09/13/2016 0722   CALCIUM 10.0 04/08/2018 0721   GFRNONAA 60 (L) 01/08/2018 0650   GFRNONAA 63 09/13/2016 0722   GFRAA >60 01/08/2018 0650   GFRAA 73 09/13/2016 0722    BNP    Component Value Date/Time   BNP 17.9 05/16/2016 1523    ProBNP No results found for: PROBNP    Assessment & Plan:   Pleasant 57 year old male patient completing acute visit with our office today.  Patient with probable COPD exacerbation and potential bronchiectatic flare.  Will treat with Augmentin as well as prednisone taper.  We will also order sputum sample.  Unfortunately patient was only using flutter valve 1 time a day for about 10 breaths.  We will have patient increase use.  Patient turned down with therapy vest based off last office notes.  Emphasized the importance of the patient of pulmonary toilet and pulmonary hygiene in management of bronchiectasis.  With patient's recurrent exacerbations in spite of triple therapy, Daliresp it may be time to consider prophylactic antibiotics such as Monday Wednesday Friday azithromycin to help prevent recurrent copd flares or once a month augmentin for bronchiectasis management.  Explained to patient that the other major factors that he needs to stop smoking.  Patient says he agrees.  Explained to patient to try to nicotine replacement therapy with patches as well as lozenges or gum.  We will bring patient back in the short-term to see Dr. Marchelle Gearing and discuss prophylactic antibiotic use of patients at a stable interval at that time.  COPD exacerbation (HCC) Augmentin >>> Take 1 875-125  mg tablet every 12 hours for the next 7 days >>> Take with food  Prednisone 10mg  tablet  >>>4 tabs for 2 days, then 3 tabs for 2 days, 2 tabs for 2 days, then 1 tab for 2 days, then stop >>>take with food  >>>take in the morning   Sputum sample  >>>three way: fungal, resp sputum, and AFB  Duoneb today   Chest Xray today   Continue Advair 500 Rinse mouth out after use  Continue Spiriva HandiHaler  We will bring him back to see Dr. Marchelle Gearing at his first available office visits that you can discuss further management of COPD  Note your daily symptoms > remember "red flags" for COPD:   >>>Increase in cough >>>increase in sputum production >>>increase in shortness of breath or activity  intolerance.   If you notice these symptoms, please call the office to be seen.   We recommend that you stop smoking.      COPD GOLD IV D Augmentin >>> Take 1 875-125 mg tablet every 12 hours for the next 7 days >>> Take with food  Prednisone 10mg  tablet  >>>4 tabs for 2 days, then 3 tabs for 2 days, 2 tabs for 2 days, then 1 tab for 2 days, then stop >>>take with food  >>>take in the morning   Sputum sample  >>>three way: fungal, resp sputum, and AFB  Duoneb today   Chest  Xray today   Continue Advair 500 Rinse mouth out after use  Continue Spiriva HandiHaler  We will bring him back to see Dr. Marchelle Gearing at his first available office visits that you can discuss further management of COPD   Note your daily symptoms > remember "red flags" for COPD:   >>>Increase in cough >>>increase in sputum production >>>increase in shortness of breath or activity  intolerance.   If you notice these symptoms, please call the office to be seen.     We recommend that you stop smoking.  >>>You need to set a quit date >>>If you have friends or family who smoke, let them know you are trying to quit and not to smoke around you or in your living environment  Smoking Cessation Resources:  1  800 QUIT NOW  >>> Patient to call this resource and utilize it to help support her quit smoking >>> Keep up your hard work with stopping smoking  You can also contact the [facility] >>>For smoking cessation classes call [phone]  We do not recommend using e-cigarettes as a form of stopping smoking  You can sign up for smoking cessation support texts and information:  >>>https://smokefree.gov/smokefreetxt   Tobacco use disorder  We recommend that you stop smoking.  >>>You need to set a quit date >>>If you have friends or family who smoke, let them know you are trying to quit and not to smoke around you or in your living environment  Smoking Cessation Resources:  1 800 QUIT NOW  >>> Patient to call this resource and utilize it to help support her quit smoking >>> Keep up your hard work with stopping smoking  You can also contact the [facility] >>>For smoking cessation classes call [phone]  We do not recommend using e-cigarettes as a form of stopping smoking  You can sign up for smoking cessation support texts and information:  >>>https://smokefree.gov/smokefreetxt  Nicotine patches: >>>Make sure you rotate sites that you do not get skin irritation, Apply 1 patch each morning to a non-hairy skin site  If you are smoking greater than 10 cigarettes/day and weigh over 45 kg start with the nicotine patch of 21 mg a day for 6 weeks, then 14 mg a day for 2 weeks, then finished with 7 mg a day for 2 weeks, then stop  If you are smoking less than 10 cigarettes a day or weight less than 45 kg start with medium dose pack of 14 mg a day for 6 weeks, followed by 7 mg a day for 2 weeks   >>>If insomnia occurs you are having trouble sleeping you can take the patch off at night, and place a new one on in the morning >>>If the patch is removed at night and you have morning cravings start short acting nicotine replacement therapy such as gum or  lozenges  Nicotine Gum:  >>>Smokers who smoke 25 or more cigarettes a day should use 4 mg dose >>>Smokers who smoke fewer than 25 cigarettes a day should use 2 mg dose  Proper chewing of gum is important for optimal results.   >>>Do not chew gum to rapidly.  Once you start chewing eating tasty peppery taste and slide the gum to your cheek.  When the taste disappears to a few more times.  Repeat this for 30 minutes.  Then discard the gum.  >>>Avoid acidic beverages such as coffee, carbonated beverages before and during gum use.  A soft acidic beverages lower  oral pH which cause nicotine to not be absorbed properly >>>If you chew the gum too quickly or vigorously you could have nausea, vomiting, abdominal pain, constipation, hiccups, headache, sore jaw, mouth irritation ulcers  Nicotine lozenge: Lozenges are commonly uses short acting NRT product  >>>Smokers who smoke within 30 minutes of awakening should use 4 mg dose >>>Smokers who wait more than 30 minutes after awakening to smoke should use 2 mg dose  Can use up to 1 lozenge every 1-2 hours for 6 weeks >>>Total amount of lozenges that can be used per day as 20 >>>Gradually reduce number of lozenges used per day after 2 weeks of use  Place lozenge in mouth and allowed to dissolve for 30 minutes loss and does not need to be chewed  Lozenges have advantages to be able to be used in people with TMG, poor dentition, dentures    Bronchiectasis without complication (HCC) ? Bronchiectic Flare   Augmentin >>> Take 1 875-125 mg tablet every 12 hours for the next 7 days >>> Take with food  Prednisone 10mg  tablet  >>>4 tabs for 2 days, then 3 tabs for 2 days, 2 tabs for 2 days, then 1 tab for 2 days, then stop >>>take with food  >>>take in the morning   Sputum sample  >>>three way: fungal, resp sputum, and AFB  Bronchiectasis: This is the medical term which indicates that you have damage, dilated airways making you more susceptible  to respiratory infection. Use a flutter valve 10 breaths twice a day or 4 to 5 breaths 4-5 times a day to help clear mucus out Let know if you have cough with change in mucus color or fevers or chills.  At that point you would need an antibiotic. Maintain a healthy nutritious diet, eating whole foods Take your medications as prescribed     This appointment was 42 minutes along with over 50% of the time direct face-to-face patient care, assessment, plan of care follow-up discussion.  [place], NP 07/06/2018

## 2018-07-06 NOTE — Assessment & Plan Note (Addendum)
?   Bronchiectic Flare   Augmentin >>> Take 1 875-125 mg tablet every 12 hours for the next 7 days >>> Take with food  Prednisone 10mg  tablet  >>>4 tabs for 2 days, then 3 tabs for 2 days, 2 tabs for 2 days, then 1 tab for 2 days, then stop >>>take with food  >>>take in the morning   Sputum sample  >>>three way: fungal, resp sputum, and AFB  Bronchiectasis: This is the medical term which indicates that you have damage, dilated airways making you more susceptible to respiratory infection. Use a flutter valve 10 breaths twice a day or 4 to 5 breaths 4-5 times a day to help clear mucus out Let know if you have cough with change in mucus color or fevers or chills.  At that point you would need an antibiotic. Maintain a healthy nutritious diet, eating whole foods Take your medications as prescribed

## 2018-07-06 NOTE — Assessment & Plan Note (Addendum)
Augmentin >>> Take 1 875-125 mg tablet every 12 hours for the next 7 days >>> Take with food  Prednisone 10mg  tablet  >>>4 tabs for 2 days, then 3 tabs for 2 days, 2 tabs for 2 days, then 1 tab for 2 days, then stop >>>take with food  >>>take in the morning   Sputum sample  >>>three way: fungal, resp sputum, and AFB  Duoneb today   Chest Xray today   Continue Advair 500 Rinse mouth out after use  Continue Spiriva HandiHaler  We will bring him back to see Dr. at his first available office visits that you can discuss further management of COPD   Note your daily symptoms > remember "red flags" for COPD:   >>>Increase in cough >>>increase in sputum production >>>increase in shortness of breath or activity  intolerance.   If you notice these symptoms, please call the office to be seen.     We recommend that you stop smoking.  >>>You need to set a quit date >>>If you have friends or family who smoke, let them know you are trying to quit and not to smoke around you or in your living environment  Smoking Cessation Resources:  1 800 QUIT NOW  >>> Patient to call this resource and utilize it to help support her quit smoking >>> Keep up your hard work with stopping smoking  You can also contact the [facility] >>>For smoking cessation classes call [phone]  We do not recommend using e-cigarettes as a form of stopping smoking  You can sign up for smoking cessation support texts and information:  >>>https://smokefree.gov/smokefreetxt

## 2018-07-06 NOTE — Progress Notes (Signed)
No acute changes. Improved upper lobe nodularity. Keep follow up with our office. No chnages at this time.   Elisha Headland FNP

## 2018-07-07 ENCOUNTER — Other Ambulatory Visit: Payer: 59

## 2018-07-07 DIAGNOSIS — J449 Chronic obstructive pulmonary disease, unspecified: Secondary | ICD-10-CM | POA: Diagnosis not present

## 2018-07-07 DIAGNOSIS — J441 Chronic obstructive pulmonary disease with (acute) exacerbation: Secondary | ICD-10-CM | POA: Diagnosis not present

## 2018-07-07 DIAGNOSIS — R0902 Hypoxemia: Secondary | ICD-10-CM | POA: Diagnosis not present

## 2018-07-07 NOTE — Telephone Encounter (Signed)
Called patient-he was seen yesterday by Georgeanna Lea; meds were given. Nothing more needed at this time.

## 2018-07-13 ENCOUNTER — Encounter: Payer: Self-pay | Admitting: Internal Medicine

## 2018-07-13 ENCOUNTER — Ambulatory Visit (INDEPENDENT_AMBULATORY_CARE_PROVIDER_SITE_OTHER): Payer: 59 | Admitting: Internal Medicine

## 2018-07-13 VITALS — BP 132/78 | HR 81 | Ht 71.26 in | Wt 186.0 lb

## 2018-07-13 DIAGNOSIS — F17210 Nicotine dependence, cigarettes, uncomplicated: Secondary | ICD-10-CM | POA: Diagnosis not present

## 2018-07-13 DIAGNOSIS — J441 Chronic obstructive pulmonary disease with (acute) exacerbation: Secondary | ICD-10-CM

## 2018-07-13 DIAGNOSIS — A488 Other specified bacterial diseases: Secondary | ICD-10-CM | POA: Diagnosis not present

## 2018-07-13 MED ORDER — PREDNISONE 10 MG PO TABS: ORAL_TABLET | ORAL | 0 refills | Status: DC

## 2018-07-13 MED ORDER — CIPROFLOXACIN HCL 500 MG PO TABS: 500.0000 mg | ORAL_TABLET | Freq: Two times a day (BID) | ORAL | 0 refills | Status: DC

## 2018-07-13 MED ORDER — FLUTICASONE-SALMETEROL 500-50 MCG/DOSE IN AEPB: 1.0000 | INHALATION_SPRAY | Freq: Two times a day (BID) | RESPIRATORY_TRACT | 5 refills | Status: DC

## 2018-07-13 MED ORDER — TIOTROPIUM BROMIDE MONOHYDRATE 2.5 MCG/ACT IN AERS: 2.0000 | INHALATION_SPRAY | Freq: Every day | RESPIRATORY_TRACT | 5 refills | Status: DC

## 2018-07-13 MED FILL — AZELASTINE HCL 137 MCG/SPRA: 137 | 25 days supply | Qty: 30 | Fill #1

## 2018-07-13 MED FILL — CIPROFLOXACIN HCL 500 MG TA: 500 | 7 days supply | Qty: 14 | Fill #0

## 2018-07-13 MED FILL — LOSARTAN POTASSIUM 25 MG TA: 25 | 30 days supply | Qty: 30 | Fill #1

## 2018-07-13 MED FILL — SPIRIVA RESPIMAT INHAL SPRY: 2.5 | 30 days supply | Qty: 4 | Fill #0

## 2018-07-13 MED FILL — ALBUTEROL 0.083% INHAL SOLN: (2.5 MG/3ML | 24 days supply | Qty: 300 | Fill #2

## 2018-07-13 MED FILL — predniSONE 10 MG TABS: 10 | 8 days supply | Qty: 15 | Fill #0

## 2018-07-13 MED FILL — DALIRESP 500 MCG TABLET: 500 | 30 days supply | Qty: 30 | Fill #2

## 2018-07-13 NOTE — Progress Notes (Signed)
OV 07/24/2016  Chief Complaint  Patient presents with  . Follow-up    Pt states overall his breathing is doing well. Pt states he has an occassional prod cough with clear to white mucus. Pt denies CP/tightness and f/c/s.     [name] with smoking, pulmonary nodules in September 2016, frequent COPD exacerbation and severe COPD based on 2000 1550  Admitted 05/16/2016 for COPD exacerbation. After that he followed up with my colleague and was doing well. Since then he's continued to do well. His COPD stable. He presents with his wife/girlfriend who is a Engineer, civil (consulting) and 60s at [facility] [employer]. He continues to refuse vaccines. He is taking triple inhaler therapy and also Roflumilast. He has lost 25 pounds due to dietary changes and it is intentional. He feels overall well. He continues to smoke a little bit and he knows the importance of quitting. Last pulmonary nodule was summer 2016 on CT chest. Chest x-rays this you have been clear.  11/15/2016 acute extended ov/Wert re: aecopd / active smoker Chief Complaint  Patient presents with  . Acute Visit    Pt c/o sinus pressure, HA, wheezing, increased SOB and cough for the past 4 days. Cough is prod with moderate amounts of green to yellow sputum. He is using his albuterol inhaler 3-4 x per day.   onset 11/12/16 flared on maint rx =  spiriva/daliresp/advair 500  plus on alb early p afternoon typically needs at least once daily    recently completed taper off pred / using saba now up to 4 x daily and noct but typically comfortable at rest p saba rx  Says can't take dulera or symbicort  Cough is worse at hs and in am and thinks this triggered the sob/ wheeze with lost of nasal congestion/obst symptoms  No obvious patterns in day to day or daytime variability or assoc mucus plugs or hemoptysis or cp or chest tightness, subjective wheeze or overt   hb symptoms. No unusual exp hx or h/o childhood pna/ asthma or knowledge of premature birth.   Also  denies any obvious fluctuation of symptoms with weather or environmental changes or other aggravating or alleviating factors except as outlined above     01/09/2017 2 week Follow Up Appointment Pt. Presents for follow up. He was seen 12/13/2015 for COPD exacerbation. He was treated with Doxycycline and a prednisone taper.He states his cough is better. He does still have secretions that are white to foamy.He no longer has purulent secretions. He is continuing to smoke. He states he is still short of breath.We discussed the shortness of breath and the correlation with smoking. We discussed the use of NicoDerm patches to assist with his attempt at smoking cessation. He states he has used these in the past. He cannot tolerate the 21 mg patch, so wants to use the 14 mg patches. I explained that I will prescribe Nicotrol inhaler for breakthrough if needed. We spent a long time discussing the need to quit smoking, as it is now affecting his ability to breathe. Patient denies fever, chest pain, orthopnea, or hemoptysis. He does complain of continued dyspnea on exertion. He is compliant with his Spiriva, Advair, and Daliresp. Last low-dose lung cancer screening CT was read as a Lung RADS 4 A : suspicious findings, either short term follow up in 3 months or alternatively .3 month Follow up CT is due to July 2018.    Test Results:  Low Dose Lung Cancer Screening Follow Up  CT:11/22/2016 No pleural effusion. Moderate changes of centrilobular emphysema noted. Diffuse bronchial wall thickening identified. Chronic scarring and architectural distortion within the posterior right upper lobe is again identified and appears similar to previous exam. Multiple pulmonary nodular densities are again identified in both lungs. Within the left upper lobe there is an enlarging nodule which has an equivalent diameter of 7.8 mm,. On the previous exam this nodule was present with an equivalent diameter of 2.2 mm.   OV  02/10/2017  Chief Complaint  Patient presents with  . Follow-up    Pt c/o increase in SOB, prod cough with white mucus, chest tightness x 3 days. Pt denies f/c/s.    Follow-up  #Advanced COPD: He is here with his wife. I did do an FMLA form for his wife in the last few days. He tells me that he is in the midst of an exacerbation. Overall in 2018 he has not had any admissions. He and his wife believe that the strategy of coming to the office or calling during an exacerbation has prevented admissions. 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 He thinks  Working hard and exposure to carpets makes his copd flare up. He is allergic to breo and dulera and does not want to change his spiriva and advair as as a result. He ison daliresp and is working well. He DOES NOT think he is ready for disability. Says he loves work  #lung nodule - left side has growin in June 2018. Has fu CT February 21, 2017  #Smoking - stil does  OV 05/23/2017  Chief Complaint  Patient presents with  . Follow-up    Pt states that he has been doing good. Does still have some chest congestion and occ. cough with yellow mucus. Denies any SOB or CP.    Follow-up  Advanced COPD:Last saw him 05/08/2017 at the time of research nodule study. At that time he had COPD exacerbation. We'll give him antibodies and prednisone. Currently he is back to baseline. 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 He takes Advair and Spiriva and this works well for him. He is also on Daliresp. He still smokes and will not quit. He does not want a flu shot. In terms of his  Lung nodule: He is a repeat CT scan of the chest6 months from July 2018   OV 08/25/2017  Chief Complaint  Patient presents with  . Follow-up    feels like cough and SOB are better,has some chest congestion,surgical clearance for r hip 09/23/17 then replace the L hip later on   Follow-up advanced stage 3 COPD: He feels he  is in the beginning stages of a COPD exacerbation.  For the last few days his chest is congested and is wheezing a little bit more.  He is interested in antibiotics and prednisone.  Otherwise he is compliant with Spiriva, Advair, Daliresp to prevent exacerbations.  Recently saw my nurse practitioner Sarah and was started on nocturnal oxygen.  He wants his listed on his medication list.  COPD CAT score is 21  Follow-up smoking: He continues to smoke but is trying to quit  New issue of preoperative lung clearance.  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  He is going to have right hip surgery first.  This can be done by Dr. August Saucer.  The surgery will take place at [facility] and not an outpatient surgical center.  He tells me that over 8 years ago he had rheumatoid arthritis and was on Humira.  Since then the rheumatoid arthritis has not bothered him.  He is off Humira because of side effects.  We did a preoperative clearance assessment as documented below.  Currently is using crutches.  He is on FMLA/disability.  He is in severe pain.    OV 12/18/2017  Chief Complaint  Patient presents with  . Follow-up    Pt had hip surgery 4/24.  Pt states he has been doing well since last visit and denies any complaints or concerns.   Follow-up several issues  Severe COPD: He is on triple inhaler therapy with Spiriva and Advair.  He does not like Breo.  He is stable.  COPD CAT score is 22.  He tells me that after right hip surgery in January 2019 and left hip surgery few to several weeks ago he is significantly better even in terms of respiratory health.  He is doing hip rehab with physical therapy.  After that he will be open to attending more pulmonary rehab.  Multiple lung nodules: In January 2019 he had low-dose CT scan for lung cancer screening.  Some of these nodules were large.  It has a waxing and waning quality.  Radiologist did not recommend annual low-dose CT scan for lung  cancer screening because of high false positivity.  It was felt the nodules were not malignant in orientation.  At this point in time is not having any fever or weight loss of sweats or chills or hemoptysis.  : He is cut down his cigarettes to 5 cigarettes a day.  He wants to quit.  He says Wellbutrin is helped him somewhat.  Now that his smoking is less he wants to start Wellbutrin again.  OV 07/13/2018  Subjective:  Patient ID: Derek Conner, male , DOB: 07-Nov-1960 , age 57 y.o. , MRN: [id number] , ADDRESS: [address] [place] [address]   07/13/2018 -   Chief Complaint  Patient presents with  . Follow-up    Pt states that his chest feels very tight, has some phlegm     HPI Derek Conner 57 y.o. -follow-up advanced COPD with recurrent exacerbations despite Spiriva, Advair, Daliresp.  Has ongoing smoking and has multiple lung nodules waxing and waning quality  Presents for routine follow-up.  Since last seeing me several months ago he is now status post bilateral hip surgery and is doing well.  He has had multiple exacerbations.  Most recently the day after Thanksgiving July 02, 2018 he had COPD exacerbation.  Could not get out of bed.  July 06, 2018 saw a Publishing rights manager.  Was subjected to sputum culture [subsequently has grown Serratia].  Sent home on Augmentin [Serratia is resistant to this] and a day prednisone course.  He has 1 more day of this left.  He says he is somewhat better but not fully back to baseline.  Prior COPD exacerbation he is recovered much faster.  He is frustrated by this.  COPD CAT score is 27.  Review of his lab results show EKG that I personally visualized #2018.  QTc interval was normal.  Allergy review shows he is not allergic to quinolones.  Given frequent exacerbations we discussed about switching to Trelegy but he said this caused some cough when he tried a sample.  He is willing to try converting his Spiriva HandiHaler to Respimat.   For now he wants to continue his Advair.  He  does not want to change this.  He reports an allergy to Symbicort.     CAT COPD Symptom & Quality of Life Score (GSK trademark) 0 is no burden. 5 is highest burden 1/21/192 12/18/2017  07/13/2018   Never Cough -> Cough all the time  2 3  No phlegm in chest -> Chest is full of phlegm  2 4  No chest tightness -> Chest feels very tight  1 3  No dyspnea for 1 flight stairs/hill -> Very dyspneic for 1 flight of stairs  5 4  No limitations for ADL at home -> Very limited with ADL at home  3 4  Confident leaving home -> Not at all confident leaving home  1 2  Sleep soundly -> Do not sleep soundly because of lung condition  5 3  Lots of Energy -> No energy at all  3 4  TOTAL Score (max 40)  21 22 27      ROS - per HPI     has a past medical history of Asthma, CAP (community acquired pneumonia) (01/19/2016), Cerebral aneurysm, Chronic bronchitis (HCC), COPD (chronic obstructive pulmonary disease) (HCC), Emphysema lung (HCC), Emphysema of lung (HCC), GERD (gastroesophageal reflux disease), Hepatitis A infection (~ 1980), High cholesterol, Hypertension, Pneumonia (1960s), Rheumatoid arthritis (HCC), Situational depression (2011), and Stroke (HCC).   reports that he has been smoking cigarettes. He has a 20.00 pack-year smoking history. He has never used smokeless tobacco.  Past Surgical History:  Procedure Laterality Date  . APPENDECTOMY  1980's  . CHEST TUBE INSERTION Left 1990's   "lung collapsed"  . CYSTECTOMY Left 1960's   "wrist"  . FOOT FRACTURE SURGERY Left ~ 2005   "it was crushed"  . IR 3D INDEPENDENT WKST  01/08/2018  . IR ANGIO INTRA EXTRACRAN SEL COM CAROTID INNOMINATE BILAT MOD SED  01/08/2018  . IR ANGIO VERTEBRAL SEL VERTEBRAL BILAT MOD SED  01/08/2018  . IR GENERIC HISTORICAL  03/27/2016   IR ANGIOGRAM FOLLOW UP STUDY 03/27/2016 Julieanne Cotton, MD [facility]  . IR GENERIC HISTORICAL  03/27/2016   IR ANGIOGRAM FOLLOW UP STUDY  03/27/2016 Julieanne Cotton, MD [facility]  . IR GENERIC HISTORICAL  03/27/2016   IR ANGIO INTRA EXTRACRAN SEL COM CAROTID INNOMINATE UNI L MOD SED 03/27/2016 Julieanne Cotton, MD [facility]  . IR GENERIC HISTORICAL  03/27/2016   IR ANGIOGRAM FOLLOW UP STUDY 03/27/2016 Julieanne Cotton, MD [facility]  . IR GENERIC HISTORICAL  03/27/2016   IR ANGIO VERTEBRAL SEL SUBCLAVIAN INNOMINATE UNI R MOD SED 03/27/2016 Julieanne Cotton, MD [facility]  . IR GENERIC HISTORICAL  03/27/2016   IR 3D INDEPENDENT WKST 03/27/2016 Julieanne Cotton, MD [facility]  . IR GENERIC HISTORICAL  03/27/2016   IR ANGIOGRAM FOLLOW UP STUDY 03/27/2016 Julieanne Cotton, MD [facility]  . IR GENERIC HISTORICAL  03/27/2016   IR NEURO EACH ADD'L AFTER BASIC UNI RIGHT (MS) 03/27/2016 Julieanne Cotton, MD [facility]  . IR GENERIC HISTORICAL  03/27/2016   IR ANGIOGRAM FOLLOW UP STUDY 03/27/2016 Julieanne Cotton, MD [facility]  . IR GENERIC HISTORICAL  03/27/2016   IR TRANSCATH/EMBOLIZ 03/27/2016 Julieanne Cotton, MD [facility]  . IR GENERIC HISTORICAL  03/27/2016   IR ANGIO INTRA EXTRACRAN SEL INTERNAL CAROTID UNI R MOD SED 03/27/2016 Julieanne Cotton, MD [facility]  . IR GENERIC HISTORICAL  03/27/2016   IR ANGIOGRAM FOLLOW UP STUDY 03/27/2016 Julieanne Cotton, MD [facility]  . IR GENERIC HISTORICAL  04/12/2016   IR RADIOLOGIST EVAL &  MGMT 04/12/2016 [facility]  . RADIOLOGY WITH ANESTHESIA N/A 02/29/2016   Procedure: EMBOLIZATION     (RADIOLOGY WITH ANESTHESIA);  Surgeon: Julieanne Cotton, MD;  Location: [facility] OR;  Service: Radiology;  Laterality: N/A;  . RADIOLOGY WITH ANESTHESIA N/A 03/27/2016   Procedure: Embolization;  Surgeon: Julieanne Cotton, MD;  Location: [facility] OR;  Service: Radiology;  Laterality: N/A;  . TOTAL HIP ARTHROPLASTY Right 09/23/2017  . TOTAL HIP ARTHROPLASTY Right 09/23/2017   Procedure: RIGHT TOTAL HIP ARTHROPLASTY ANTERIOR APPROACH;  Surgeon: Cammy Copa, MD;  Location: [facility] OR;   Service: Orthopedics;  Laterality: Right;  . TOTAL HIP ARTHROPLASTY Left 11/26/2017   Procedure: LEFT TOTAL HIP ARTHROPLASTY ANTERIOR APPROACH;  Surgeon: Cammy Copa, MD;  Location: [facility] OR;  Service: Orthopedics;  Laterality: Left;  Marland Kitchen VIDEO BRONCHOSCOPY Bilateral 12/06/2013   Procedure: VIDEO BRONCHOSCOPY WITHOUT FLUORO;  Surgeon: Alyson Reedy, MD;  Location[facility] ENDOSCOPY;  Service: Cardiopulmonary;  Laterality: Bilateral;    Allergies  Allergen Reactions  . Symbicort [Budesonide-Formoterol Fumarate] Shortness Of Breath, Nausea And Vomiting and Other (See Comments)    heachache  . Dulera [Mometasone Furo-Formoterol Fum] Other (See Comments)    Headache   . Humira [Adalimumab] Nausea And Vomiting    Headaches     Immunization History  Administered Date(s) Administered  . Tdap 08/29/2015    Family History  Problem Relation Age of Onset  . Cancer Mother   . Arthritis Mother   . Stroke Father   . Hypertension Father   . Healthy Daughter   . Healthy Son   . Colon cancer Neg Hx      Current Outpatient Medications:  .  acetaminophen (TYLENOL) 500 MG tablet, Take 1,000 mg by mouth daily as needed for moderate pain or headache., Disp: , Rfl:  .  albuterol (PROVENTIL HFA;VENTOLIN HFA) 108 (90 Base) MCG/ACT inhaler, Inhale 2 puffs into the lungs every 4 (four) hours as needed for wheezing or shortness of breath., Disp: , Rfl:  .  albuterol (PROVENTIL) (2.5 MG/3ML) 0.083% nebulizer solution, TAKE 3 MLS (2.5 MG TOTAL) BY NEBULIZATION EVERY 6 (SIX) HOURS AS NEEDED FOR WHEEZING OR SHORTNESS OF BREATH., Disp: 75 mL, Rfl: 12 .  ALPRAZolam (XANAX) 0.5 MG tablet, Take 1 tablet (0.5 mg total) by mouth 2 (two) times daily as needed for anxiety., Disp: 15 tablet, Rfl: 0 .  amLODipine (NORVASC) 10 MG tablet, TAKE 1 TABLET (10 MG) BY MOUTH DAILY., Disp: 90 tablet, Rfl: 3 .  azelastine (ASTELIN) 0.1 % nasal spray, Place 2 sprays into both nostrils 2 (two) times daily. Use in each nostril as  directed, Disp: 30 mL, Rfl: 1 .  b complex vitamins tablet, Take 1 tablet by mouth daily., Disp: , Rfl:  .  cloNIDine (CATAPRES) 0.2 MG tablet, Take 1 tablet (0.2 mg total) by mouth 2 (two) times daily., Disp: 60 tablet, Rfl: 11 .  DALIRESP 500 MCG TABS tablet, TAKE 1 TABLET (500 MCG TOTAL) BY MOUTH DAILY., Disp: 30 tablet, Rfl: 6 .  famotidine (PEPCID) 20 MG tablet, TAKE 1 TABLET (20 MG TOTAL) BY MOUTH AT BEDTIME., Disp: 90 tablet, Rfl: 1 .  fenofibrate 54 MG tablet, Take 1 tablet (54 mg total) by mouth daily., Disp: 30 tablet, Rfl: 2 .  losartan (COZAAR) 25 MG tablet, Take 1 tablet (25 mg total) by mouth daily., Disp: 30 tablet, Rfl: 3 .  OXYGEN, Inhale 2 L into the lungs at bedtime., Disp: , Rfl:  .  pantoprazole (PROTONIX) 40 MG tablet,  TAKE 1 TABLET (40 MG TOTAL) BY MOUTH DAILY AT 12:00 NOON., Disp: 30 tablet, Rfl: 3 .  predniSONE (DELTASONE) 10 MG tablet, 4 tabs for 2 days, then 3 tabs for 2 days, 2 tabs for 2 days, then 1 tab for 2 days, then stop, Disp: 20 tablet, Rfl: 0 .  tiotropium (SPIRIVA) 18 MCG inhalation capsule, Place 1 capsule (18 mcg total) into inhaler and inhale daily., Disp: 30 capsule, Rfl: 6 .  ciprofloxacin (CIPRO) 500 MG tablet, Take 1 tablet (500 mg total) by mouth 2 (two) times daily., Disp: 14 tablet, Rfl: 0 .  predniSONE (DELTASONE) 10 MG tablet, 4 tabs x2 days, 2 tabs x 2 days, 1 tab x 2 days, 0.5 tab x 2 days thens  top, Disp: 15 tablet, Rfl: 0 .  Tiotropium Bromide Monohydrate (SPIRIVA RESPIMAT) 2.5 MCG/ACT AERS, Inhale 2 puffs into the lungs daily., Disp: 1 Inhaler, Rfl: 5 No current facility-administered medications for this visit.   Facility-Administered Medications Ordered in Other Visits:  .  0.9 %  sodium chloride infusion, , Intravenous, Continuous, Ralene Muskrat, PA-C .  aspirin EC tablet 325 mg, 325 mg, Oral, 60 min Pre-Op, Turpin, Pamela, PA-C .  niMODipine (NIMOTOP) capsule 0-60 mg, 0-60 mg, Oral, 60 min Pre-Op, Ralene Muskrat, PA-C      Objective:    Vitals:   07/13/18 0953  BP: 132/78  Pulse: 81  SpO2: 97%  Weight: 186 lb (84.4 kg)  Height: 5' 11.26" (1.81 m)    Estimated body mass index is 25.75 kg/m as calculated from the following:   Height as of this encounter: 5' 11.26" (1.81 m).   Weight as of this encounter: 186 lb (84.4 kg).  @WEIGHTCHANGE @    07/13/18 0953  Weight: 186 lb (84.4 kg)     Physical Exam  General Appearance:    Alert, cooperative, no distress, appears stated age - yes , Deconditioned looking - no , OBESE  - no, Sitting on Wheelchair -  no  Head:    Normocephalic, without obvious abnormality, atraumatic  Eyes:    PERRL, conjunctiva/corneas clear,  Ears:    Normal TM's and external ear canals, both ears  Nose:   Nares normal, septum midline, mucosa normal, no drainage    or sinus tenderness. OXYGEN ON  - no . Patient is @ ra   Throat:   Lips, mucosa, and tongue normal; teeth and gums normal. Cyanosis on lips - no  Neck:   Supple, symmetrical, trachea midline, no adenopathy;    thyroid:  no enlargement/tenderness/nodules; no carotid   bruit or JVD  Back:     Symmetric, no curvature, ROM normal, no CVA tenderness  Lungs:     Distress - no , Wheeze YES but in bilateral UL, Barrell Chest - no, Purse lip breathing - no, Crackles - no   Chest Wall:    No tenderness or deformity.    Heart:    Regular rate and rhythm, S1 and S2 normal, no rub   or gallop, Murmur - no  Breast Exam:    NOT DONE  Abdomen:     Soft, non-tender, bowel sounds active all four quadrants,    no masses, no organomegaly. Visceral obesity - no  Genitalia:   NOT DONE  Rectal:   NOT DONE  Extremities:   Extremities - normal, Has Cane - no, Clubbing - no, Edema - no  Pulses:   2+ and symmetric all extremities  Skin:   Stigmata of Connective  Tissue Disease - no  Lymph nodes:   Cervical, supraclavicular, and axillary nodes normal  Psychiatric:  Neurologic:   Pleasant - yes, Anxious - no, Flat affect - no  CAm-ICU -  neg, Alert and Oriented x 3 - yes, Moves all 4s - yes, Speech - normal, Cognition - intact           Assessment:       ICD-10-CM   1. COPD exacerbation (HCC) J44.1   2. 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 Cigarette smoker F17.210    Serratia is a new issue.  Likely colonizer causing exacerbation.  Likely acquired from recent hospitalizations.    Plan:     Patient Instructions     ICD-10-CM   1. COPD exacerbation (HCC) J44.1   2. 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 Cigarette smoker F17.210     Glad better but too bad only some improved There is serratia growing in your sputum from 07/07/18 - this probably why recent antibiotic did not help fully  Plan  - repeat prednisone as follow - Take prednisone 40 mg daily x 2 days, then 20mg  daily x 2 days, then 10mg  daily x 2 days, then 5mg  daily x 2 days and stop  - ciprofloxacin 500mg  twice daily  X 7 days (stop augmentin)  - QTC interval ok in Nov 2018  - let us know if tendons hurt with this; if so stop immediately  - change spirivan handihaler to respimat - take daily  - continue advair daily  - albuterol as needed  -Needs to quit smoking and this was emphasized again. Followup 3 months or sooner if needed; CAT score at followup     SIGNATURE    Dr. Kalman Shan, M.D., F.C.C.P,  Pulmonary and Critical Care Medicine Staff Physician, [facility] Center Director - Interstitial Lung Disease  Program  Pulmonary Fibrosis [facility] at [facility] [place], [place], [address]  Pager: [phone], If no answer or between  15:00h - 7:00h: call [phone] Telephone: [phone]  10:32 AM 07/13/2018

## 2018-07-13 NOTE — Patient Instructions (Addendum)
ICD-10-CM   1. COPD exacerbation (HCC) J44.1   2. 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 Cigarette smoker F17.210     Glad better but too bad only some improved There is serratia growing in your sputum from 07/07/18 - this probably why recent antibiotic did not help fully  Plan  - repeat prednisone as follow - Take prednisone 40 mg daily x 2 days, then 20mg  daily x 2 days, then 10mg  daily x 2 days, then 5mg  daily x 2 days and stop  - ciprofloxacin 500mg  twice daily  X 7 days (stop augmentin)  - QTC interval ok in Nov 2018  - let know if tendons hurt with this; if so stop immediately  - change spirivan handihaler to respimat - take daily  - continue advair daily  - albuterol as needed  -Needs to quit smoking and this was emphasized again. Followup 3 months or sooner if needed; CAT score at followup

## 2018-07-15 NOTE — Progress Notes (Signed)
Patient was seen by Dr. Marchelle Gearing on 12/9 and treated with ciprofloxacin.  Nothing further needed at this time.  Elisha Headland FNP

## 2018-07-16 ENCOUNTER — Ambulatory Visit: Payer: 59 | Admitting: Primary Care

## 2018-07-21 ENCOUNTER — Other Ambulatory Visit: Payer: Self-pay | Admitting: Family Medicine

## 2018-07-21 ENCOUNTER — Other Ambulatory Visit: Payer: Self-pay | Admitting: Medical

## 2018-07-21 DIAGNOSIS — I10 Essential (primary) hypertension: Secondary | ICD-10-CM

## 2018-07-22 ENCOUNTER — Other Ambulatory Visit: Payer: Self-pay

## 2018-07-22 ENCOUNTER — Other Ambulatory Visit: Payer: Self-pay | Admitting: Medical

## 2018-07-22 DIAGNOSIS — J441 Chronic obstructive pulmonary disease with (acute) exacerbation: Secondary | ICD-10-CM

## 2018-07-22 MED FILL — LISINOPRIL 10 MG TABS: 10 | 90 days supply | Qty: 90 | Fill #0

## 2018-07-23 ENCOUNTER — Telehealth: Payer: Self-pay | Admitting: Medical

## 2018-07-23 MED ORDER — CLONIDINE HCL 0.2 MG PO TABS: 0.2000 mg | ORAL_TABLET | Freq: Two times a day (BID) | ORAL | 11 refills | Status: AC

## 2018-07-23 MED ORDER — ALBUTEROL SULFATE HFA 108 (90 BASE) MCG/ACT IN AERS: 2.0000 | INHALATION_SPRAY | RESPIRATORY_TRACT | 5 refills | Status: DC | PRN

## 2018-07-23 MED ORDER — FENOFIBRATE 54 MG PO TABS: 54.0000 mg | ORAL_TABLET | Freq: Every day | ORAL | 11 refills | Status: AC

## 2018-07-23 MED ORDER — ALPRAZOLAM 0.5 MG PO TABS: 0.5000 mg | ORAL_TABLET | Freq: Two times a day (BID) | ORAL | 0 refills | Status: AC | PRN

## 2018-07-23 MED FILL — cloNIDine HCL 0.2 MG TABS: 0.2 | 90 days supply | Qty: 180 | Fill #0

## 2018-07-23 MED FILL — VENTOLIN HFA 90 MCG INHALER: 108 (90 BAS | 25 days supply | Qty: 18 | Fill #0

## 2018-07-23 MED FILL — FENOFIBRATE 54 MG TABLET: 54 | 90 days supply | Qty: 90 | Fill #0

## 2018-07-23 MED FILL — ALPRAZolam 0.5 MG TABS: 0.5 | 7 days supply | Qty: 15 | Fill #0

## 2018-07-23 NOTE — Telephone Encounter (Signed)
Rx refill clonidine, fenofibrate and xanax. Limited number of xanax. Looks like Dr. Patsy Lager rx'd lisinopril.

## 2018-07-23 NOTE — Telephone Encounter (Signed)
I refilled all of the medications he requested other day except for lisinopril.  It looks like Dr. Patsy Lager helped and sent that prescription in.  Then later so that he wanted prescription of losartan.  We can refill losartan but we need to get clarification and make sure that he is not taking lisinopril and losartan at the same time.  Would you call patient's pharmacy/where lisinopril was sent and make sure that he did not pick that prescription up.  Cancel lisinopril and then please send over prescription of losartan.  Also you could call patient and ask if this was a mixup with the pharmacy and asking for both lisinopril and losartan to be filled.  These are very similar medications and not prescribed together.

## 2018-07-24 NOTE — Telephone Encounter (Signed)
Called pharmacy-spoke with Derek Conner, they did state he picked up both medications but was told not to take them together. He has been taking only losartan as instructed to. Patient states he knows not to take them together and that this was a mistake.

## 2018-07-31 ENCOUNTER — Encounter: Payer: Self-pay | Admitting: Nurse Practitioner

## 2018-07-31 ENCOUNTER — Telehealth: Payer: Self-pay | Admitting: Internal Medicine

## 2018-07-31 ENCOUNTER — Ambulatory Visit (INDEPENDENT_AMBULATORY_CARE_PROVIDER_SITE_OTHER): Payer: 59 | Admitting: Nurse Practitioner

## 2018-07-31 VITALS — BP 124/80 | HR 94 | Temp 98.8°F | Ht 71.26 in | Wt 184.2 lb

## 2018-07-31 DIAGNOSIS — F17210 Nicotine dependence, cigarettes, uncomplicated: Secondary | ICD-10-CM

## 2018-07-31 DIAGNOSIS — R509 Fever, unspecified: Secondary | ICD-10-CM

## 2018-07-31 DIAGNOSIS — J441 Chronic obstructive pulmonary disease with (acute) exacerbation: Secondary | ICD-10-CM | POA: Diagnosis not present

## 2018-07-31 DIAGNOSIS — A488 Other specified bacterial diseases: Secondary | ICD-10-CM | POA: Diagnosis not present

## 2018-07-31 LAB — POCT INFLUENZA A/B
Influenza A, POC: NEGATIVE
Influenza B, POC: NEGATIVE

## 2018-07-31 MED ORDER — LEVOFLOXACIN 750 MG PO TABS: 750.0000 mg | ORAL_TABLET | Freq: Every day | ORAL | 0 refills | Status: DC

## 2018-07-31 MED ORDER — PREDNISONE 10 MG PO TABS: 20.0000 mg | ORAL_TABLET | Freq: Every day | ORAL | 0 refills | Status: AC

## 2018-07-31 MED FILL — predniSONE 10 MG TABS: 10 | 5 days supply | Qty: 10 | Fill #0

## 2018-07-31 MED FILL — levoFLOXacin 750 MG TABS: 750 | 7 days supply | Qty: 7 | Fill #0

## 2018-07-31 NOTE — Patient Instructions (Addendum)
Will order Levaquin per last respiratory sputum results Will order prednisone QTC interval ok in Nov 2018             - let us know if tendons hurt with this; if so stop immediately Continue all other medications May take mucinex May take delsym for cough Follow up with Dr. Marchelle Gearing at his 1 st available appointment or sooner if needed

## 2018-07-31 NOTE — Telephone Encounter (Signed)
Called and spoke with pt who stated 3 days ago, pt began to have a productive cough with green phlegm, increased SOB, and stated he might have a slight fever as he has had chills.   Pt did not receive flu shot as he does not get them. Stated to pt we needed him to come in for an appt to make sure this was not the flu based on his symptoms. Pt expressed understanding.  Scheduled pt OV today, 12/27 at 12pm. Nothing further needed.

## 2018-07-31 NOTE — Progress Notes (Signed)
@Patient  ID: , male    DOB: Aug 12, 1960, 57 y.o.   MRN: [id number]  Chief Complaint  Patient presents with  . Shortness of Breath    Cough with congestion    Referring provider: Saguier, [id number], PA-C  HPI  57 year old male current every day smoker followed in our office for COPD Gold 3, MAI, community-acquired pneumonia, right pulmonary nodule  Smoker/ Smoking History: Current Everyday Smoker.  Maintenance:  Advair 500, Spiriva 2.5, Daliresp Pt of: Dr. 58  Tests:  PFTs 02/15/14 show severe airflow obstruction with an FEV1 of 19% with significant bronchodilator response with a 40% improvement.  03/31/2014 Spirometry FEV1 at 26%  Alpha 1 >Nml/ MM  04/10/2018-chest x-ray- lungs are hyperinflated, persistent nodules in upper lobes bilaterally with a low right nodule.  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  He was last seen by Dr. 08/05/18 on 07/13/2018 and was given Cipro and prednisone after recent respiratory culture came back positive for Serratia marcescens.  Patient states that he did start feeling better after taking Cipro.  The last 3 days symptoms have returned.  He is having cough that is productive of yellow sputum, headache, and some shortness of breath.  He states that he felt like he had a fever but did not check his temperature.   He is compliant with Spiriva and Advair.  He is also taking Daliresp.  He is afebrile in office today.  Denies any chest pain or edema.    Allergies  Allergen Reactions  . Symbicort [Budesonide-Formoterol Fumarate] Shortness Of Breath, Nausea And Vomiting and Other (See Comments)    heachache  . Dulera [Mometasone Furo-Formoterol Fum] Other (See Comments)    Headache   . Humira [Adalimumab] Nausea And Vomiting    Headaches     Immunization History  Administered Date(s) Administered  . Tdap 08/29/2015    Past Medical History:  Diagnosis Date  . Asthma   . CAP (community acquired pneumonia) 01/19/2016  . Cerebral aneurysm   . Chronic bronchitis (HCC)   . COPD (chronic obstructive pulmonary disease) (HCC)   . Emphysema lung (HCC)   . Emphysema of lung (HCC)   . GERD (gastroesophageal reflux disease)   . Hepatitis A infection ~ 1980  . High cholesterol   . Hypertension   . Pneumonia 1960s  . Rheumatoid arthritis (HCC)    "hands mostly"  ( 01/19/2016)  . Situational depression 2009/11/28   "only when my mother died"  . Stroke [facility])    mini stroke when doing embolization, 11/29/2015, no deficits    Tobacco History: Social History   Tobacco Use  Smoking Status Current Every Day Smoker  . Packs/day: 0.50  . Years: 40.00  . Pack years: 20.00  . Types: Cigarettes  Smokeless Tobacco Never Used  Tobacco Comment   8-10 per day (07-06-2018)   Ready to quit: Not Answered Counseling given: Not Answered Comment: 8-10 per day (07-06-2018)   Outpatient Encounter Medications as of 07/31/2018  Medication Sig  . acetaminophen (TYLENOL) 500 MG tablet  Take 1,000 mg by mouth daily as needed for moderate pain or headache.  . albuterol (PROVENTIL HFA;VENTOLIN HFA) 108 (90 Base) MCG/ACT inhaler Inhale 2 puffs into the lungs every 4 (four) hours as needed for wheezing or shortness of breath.  Marland Kitchen albuterol (PROVENTIL) (2.5 MG/3ML) 0.083% nebulizer solution TAKE 3 MLS (2.5 MG TOTAL) BY NEBULIZATION  EVERY 6 (SIX) HOURS AS NEEDED FOR WHEEZING OR SHORTNESS OF BREATH.  Marland Kitchen ALPRAZolam (XANAX) 0.5 MG tablet Take 1 tablet (0.5 mg total) by mouth 2 (two) times daily as needed for anxiety.  Marland Kitchen amLODipine (NORVASC) 10 MG tablet TAKE 1 TABLET (10 MG) BY MOUTH DAILY.  Marland Kitchen azelastine (ASTELIN) 0.1 % nasal spray Place 2 sprays into both nostrils 2 (two) times daily. Use in each nostril as directed  . b complex vitamins tablet Take 1 tablet by mouth daily.  . cloNIDine (CATAPRES) 0.2 MG tablet Take 1 tablet (0.2 mg total) by mouth 2 (two) times daily.  Marland Kitchen DALIRESP 500 MCG TABS tablet TAKE 1 TABLET (500 MCG TOTAL) BY MOUTH DAILY.  . famotidine (PEPCID) 20 MG tablet TAKE 1 TABLET (20 MG TOTAL) BY MOUTH AT BEDTIME.  . fenofibrate 54 MG tablet Take 1 tablet (54 mg total) by mouth daily.  . Fluticasone-Salmeterol (ADVAIR DISKUS) 500-50 MCG/DOSE AEPB Inhale 1 puff into the lungs 2 (two) times daily.  Marland Kitchen lisinopril (PRINIVIL,ZESTRIL) 10 MG tablet TAKE 1 TABLET (10 MG) BY MOUTH DAILY.  Marland Kitchen losartan (COZAAR) 25 MG tablet Take 1 tablet (25 mg total) by mouth daily.  . OXYGEN Inhale 2 L into the lungs at bedtime.  . pantoprazole (PROTONIX) 40 MG tablet TAKE 1 TABLET (40 MG TOTAL) BY MOUTH DAILY AT 12:00 NOON.  . Tiotropium Bromide Monohydrate (SPIRIVA RESPIMAT) 2.5 MCG/ACT AERS Inhale 2 puffs into the lungs daily.  Marland Kitchen levofloxacin (LEVAQUIN) 750 MG tablet Take 1 tablet (750 mg total) by mouth daily.  . predniSONE (DELTASONE) 10 MG tablet Take 2 tablets (20 mg total) by mouth daily with breakfast for 5 days.  . [DISCONTINUED] ciprofloxacin (CIPRO) 500 MG tablet Take 1 tablet (500 mg total) by mouth 2 (two) times daily. (Patient not taking: Reported on 07/31/2018)  . [DISCONTINUED] predniSONE (DELTASONE) 10 MG tablet 4 tabs for 2 days, then 3 tabs for 2 days, 2 tabs for 2 days, then 1 tab for 2 days, then stop (Patient not taking: Reported on 07/31/2018)  . [DISCONTINUED] predniSONE (DELTASONE) 10 MG tablet 4 tabs x2 days, 2  tabs x 2 days, 1 tab x 2 days, 0.5 tab x 2 days thens  top (Patient not taking: Reported on 07/31/2018)   Facility-Administered Encounter Medications as of 07/31/2018  Medication  . 0.9 %  sodium chloride infusion  . aspirin EC tablet 325 mg  . niMODipine (NIMOTOP) capsule 0-60 mg     Review of Systems  Review of Systems  Constitutional: Positive for fever. Negative for chills.  HENT: Negative.  Negative for congestion and postnasal drip.   Respiratory: Positive for cough and shortness of breath.   Cardiovascular: Negative.  Negative for chest pain, palpitations and leg swelling.  Gastrointestinal: Negative.   Allergic/Immunologic: Negative.   Neurological: Negative.   Psychiatric/Behavioral: Negative.        Physical Exam  BP 124/80 (BP Location: Left Arm, Patient Position: Sitting, Cuff Size: Normal)   Pulse 94   Temp 98.8 F (37.1 C)   Ht 5' 11.26" (1.81 m)   Wt 184 lb 3.2 oz (83.6 kg)   SpO2 96%  BMI 25.50 kg/m   Wt Readings from Last 5 Encounters:  07/31/18 184 lb 3.2 oz (83.6 kg)  07/13/18 186 lb (84.4 kg)  06/16/18 182 lb 9.6 oz (82.8 kg)  05/19/18 180 lb 12.8 oz (82 kg)  04/10/18 177 lb 12.8 oz (80.6 kg)     Physical Exam Vitals signs and nursing note reviewed.  Constitutional:      General: He is not in acute distress.    Appearance: He is well-developed.  Cardiovascular:     Rate and Rhythm: Normal rate and regular rhythm.  Pulmonary:     Effort: Pulmonary effort is normal.     Breath sounds: Normal breath sounds. No decreased breath sounds, wheezing or rhonchi.  Musculoskeletal:     Right lower leg: No edema.  Skin:    General: Skin is warm and dry.  Neurological:     Mental Status: He is alert and oriented to person, place, and time.     Imaging: Dg Chest 2 View  Result Date: 07/06/2018 CLINICAL DATA:  Wheezing.  Cough. EXAM: CHEST - 2 VIEW COMPARISON:  April 10, 2018 FINDINGS: The upper lobe nodularity persists but appears mildly  improved. Again noted is a cavitary nodule in the right upper lobe. No new nodules or masses. The cardiomediastinal silhouette is unremarkable. Hyperexpansion of the lungs consistent with COPD or emphysema. No other acute abnormalities. IMPRESSION: Persistent but improved nodularity in the apices, including a cavitary nodule in the right upper lobe. Recommend follow-up to complete resolution. Emphysema. Electronically Signed   By: Gerome Sam III M.D   On: 07/06/2018 15:56     Assessment & Plan:   Serratia marcescens infection Patient has recently been treated with Augmentin and Cipro. Recent sputum was positive for serratia marcescens. Will treat with a round of Levaquin. Will have him come in for a close follow up with Dr. Marchelle Gearing.   Patient Instructions  Will order Levaquin per last respiratory sputum results Will order prednisone QTC interval ok in Nov 2018             - let us know if tendons hurt with this; if so stop immediately Continue all other medications May take mucinex May take delsym for cough Follow up with Dr. Marchelle Gearing at his 1 st available appointment or sooner if needed    Cigarette smoker Please quit smoking  COPD exacerbation [facility]) Patient Instructions  Will order Levaquin per last respiratory sputum results Will order prednisone QTC interval ok in Nov 2018             - let us know if tendons hurt with this; if so stop immediately Continue all other medications May take mucinex May take delsym for cough Follow up with Dr. Marchelle Gearing at his 1 st available appointment or sooner if needed       Ivonne Andrew, NP 08/03/2018

## 2018-08-03 ENCOUNTER — Encounter: Payer: Self-pay | Admitting: Nurse Practitioner

## 2018-08-03 DIAGNOSIS — A488 Other specified bacterial diseases: Secondary | ICD-10-CM | POA: Insufficient documentation

## 2018-08-03 MED FILL — ADVAIR 500/50 DISKUS: 500-50 | 30 days supply | Qty: 60 | Fill #1

## 2018-08-03 NOTE — Assessment & Plan Note (Signed)
Patient Instructions  Will order Levaquin per last respiratory sputum results Will order prednisone QTC interval ok in Nov 2018             - let us know if tendons hurt with this; if so stop immediately Continue all other medications May take mucinex May take delsym for cough Follow up with Dr. Marchelle Gearing at his 1 st available appointment or sooner if needed

## 2018-08-03 NOTE — Assessment & Plan Note (Signed)
Patient has recently been treated with Augmentin and Cipro. Recent sputum was positive for serratia marcescens. Will treat with a round of Levaquin. Will have him come in for a close follow up with Dr. Marchelle Gearing.   Patient Instructions  Will order Levaquin per last respiratory sputum results Will order prednisone QTC interval ok in Nov 2018             - let us know if tendons hurt with this; if so stop immediately Continue all other medications May take mucinex May take delsym for cough Follow up with Dr. Marchelle Gearing at his 1 st available appointment or sooner if needed

## 2018-08-03 NOTE — Assessment & Plan Note (Signed)
Please quit smoking 

## 2018-08-07 DIAGNOSIS — J441 Chronic obstructive pulmonary disease with (acute) exacerbation: Secondary | ICD-10-CM | POA: Diagnosis not present

## 2018-08-07 DIAGNOSIS — R0902 Hypoxemia: Secondary | ICD-10-CM | POA: Diagnosis not present

## 2018-08-07 DIAGNOSIS — J449 Chronic obstructive pulmonary disease, unspecified: Secondary | ICD-10-CM | POA: Diagnosis not present

## 2018-08-09 MED FILL — SPIRIVA RESPIMAT INHAL SPRY: 2.5 | 30 days supply | Qty: 4 | Fill #1

## 2018-08-14 ENCOUNTER — Telehealth: Payer: Self-pay | Admitting: Internal Medicine

## 2018-08-14 MED ORDER — ROFLUMILAST 500 MCG PO TABS: 500.0000 ug | ORAL_TABLET | Freq: Every day | ORAL | 6 refills | Status: DC

## 2018-08-14 NOTE — Telephone Encounter (Signed)
Spoke with pt and advised rx for Daliresp  sent to [facility] [place] [facility]. Nothing further is needed.

## 2018-08-17 ENCOUNTER — Telehealth: Payer: Self-pay | Admitting: Internal Medicine

## 2018-08-17 NOTE — Telephone Encounter (Signed)
Called and spoke with pt stating to him that we would do the PA once we received it. Pt stated he was told by pharmacy that they have been trying to send the PA request to our office. I stated to pt that they might be trying to send it to our old office and that could be why we had not received it. Stated to pt that I would call pharmacy to get things taken care of for him. Pt expressed understanding.  Called pt's pharmacy and spoke with Jesusita Okaan and provided him with our new office phone number and fax number so that way they can send us the PA for pt's med. Nothing further needed.

## 2018-08-21 LAB — RESPIRATORY CULTURE OR RESPIRATORY AND SPUTUM CULTURE
MICRO NUMBER:: 91445931
RESULT:: NORMAL
SPECIMEN QUALITY:: ADEQUATE

## 2018-08-21 LAB — MYCOBACTERIA,CULT W/FLUOROCHROME SMEAR
MICRO NUMBER:: 91445930
SMEAR:: NONE SEEN
SPECIMEN QUALITY:: ADEQUATE

## 2018-08-21 LAB — FUNGUS CULTURE W SMEAR
MICRO NUMBER: 91445929
SMEAR:: NONE SEEN
SPECIMEN QUALITY:: ADEQUATE

## 2018-08-24 ENCOUNTER — Telehealth: Payer: Self-pay | Admitting: Internal Medicine

## 2018-08-24 MED FILL — DALIRESP 500 MCG TABLET: 500 | 30 days supply | Qty: 30 | Fill #0

## 2018-08-24 NOTE — Telephone Encounter (Signed)
See closed 1/13 phone note- PA info was requested but then phone note was closed prematurely, so it was not followed up on.   CMM Key: [id number] Attempted to initiate PA via [email or web address], but the name and DOB didn't match his insurance info.   Called [facility]MM at [phone], was advised by a rep that this particular PA could not be completed online, and had to have a form filled out and faxed back.  Verified fax# for office.  Routing to [place] to look out for form.

## 2018-08-26 ENCOUNTER — Telehealth (HOSPITAL_COMMUNITY): Payer: Self-pay

## 2018-08-26 NOTE — Telephone Encounter (Signed)
Called to schedule f/u mri, no answer, left vm. AW 

## 2018-08-27 NOTE — Telephone Encounter (Signed)
I have filled out the PA form for pt's daliresp. Will fax once have had a signature from MR.

## 2018-08-31 ENCOUNTER — Telehealth: Payer: Self-pay | Admitting: Internal Medicine

## 2018-08-31 MED ORDER — NICOTINE 14 MG/24HR TD PT24: 14.0000 mg | MEDICATED_PATCH | Freq: Every day | TRANSDERMAL | 0 refills | Status: AC

## 2018-08-31 MED ORDER — DOXYCYCLINE HYCLATE 100 MG PO TABS: 100.0000 mg | ORAL_TABLET | Freq: Two times a day (BID) | ORAL | 0 refills | Status: DC

## 2018-08-31 MED ORDER — PREDNISONE 10 MG PO TABS: ORAL_TABLET | ORAL | 0 refills | Status: DC

## 2018-08-31 MED FILL — predniSONE 10 MG TABS: 10 | 5 days supply | Qty: 11 | Fill #0

## 2018-08-31 MED FILL — NICOTINE 14 MG/24HR PATCH: 14 | 28 days supply | Qty: 28 | Fill #0

## 2018-08-31 MED FILL — DOXYCYCLINE HYCLATE 100 MG: 100 | 5 days supply | Qty: 10 | Fill #0

## 2018-08-31 MED FILL — ADVAIR 500/50 DISKUS: 500-50 | 30 days supply | Qty: 60 | Fill #2

## 2018-08-31 MED FILL — VENTOLIN HFA 90 MCG INHALER: 108 (90 BAS | 17 days supply | Qty: 18 | Fill #1

## 2018-08-31 NOTE — Telephone Encounter (Signed)
Pt states he is having a COPD flare up for past several days and states MR would usually abx and prednisone taper Rx.  Pt notes he would like Nicotine patches starting with 14 mg to help with smoking.  Ramaswamy, please advise. Thanks   Current Outpatient Medications on File Prior to Visit  Medication Sig Dispense Refill  . acetaminophen (TYLENOL) 500 MG tablet Take 1,000 mg by mouth daily as needed for moderate pain or headache.    . albuterol (PROVENTIL HFA;VENTOLIN HFA) 108 (90 Base) MCG/ACT inhaler Inhale 2 puffs into the lungs every 4 (four) hours as needed for wheezing or shortness of breath. 1 Inhaler 5  . albuterol (PROVENTIL) (2.5 MG/3ML) 0.083% nebulizer solution TAKE 3 MLS (2.5 MG TOTAL) BY NEBULIZATION EVERY 6 (SIX) HOURS AS NEEDED FOR WHEEZING OR SHORTNESS OF BREATH. 75 mL 12  . ALPRAZolam (XANAX) 0.5 MG tablet Take 1 tablet (0.5 mg total) by mouth 2 (two) times daily as needed for anxiety. 15 tablet 0  . amLODipine (NORVASC) 10 MG tablet TAKE 1 TABLET (10 MG) BY MOUTH DAILY. 90 tablet 3  . azelastine (ASTELIN) 0.1 % nasal spray Place 2 sprays into both nostrils 2 (two) times daily. Use in each nostril as directed 30 mL 1  . b complex vitamins tablet Take 1 tablet by mouth daily.    . cloNIDine (CATAPRES) 0.2 MG tablet Take 1 tablet (0.2 mg total) by mouth 2 (two) times daily. 60 tablet 11  . famotidine (PEPCID) 20 MG tablet TAKE 1 TABLET (20 MG TOTAL) BY MOUTH AT BEDTIME. 90 tablet 1  . fenofibrate 54 MG tablet Take 1 tablet (54 mg total) by mouth daily. 30 tablet 11  . Fluticasone-Salmeterol (ADVAIR DISKUS) 500-50 MCG/DOSE AEPB Inhale 1 puff into the lungs 2 (two) times daily. 60 each 5  . levofloxacin (LEVAQUIN) 750 MG tablet Take 1 tablet (750 mg total) by mouth daily. 7 tablet 0  . lisinopril (PRINIVIL,ZESTRIL) 10 MG tablet TAKE 1 TABLET (10 MG) BY MOUTH DAILY. 90 tablet 3  . losartan (COZAAR) 25 MG tablet Take 1 tablet (25 mg total) by mouth daily. 30 tablet 3  . OXYGEN Inhale  2 L into the lungs at bedtime.    . pantoprazole (PROTONIX) 40 MG tablet TAKE 1 TABLET (40 MG TOTAL) BY MOUTH DAILY AT 12:00 NOON. 30 tablet 3  . roflumilast (DALIRESP) 500 MCG TABS tablet Take 1 tablet (500 mcg total) by mouth daily. 30 tablet 6  . Tiotropium Bromide Monohydrate (SPIRIVA RESPIMAT) 2.5 MCG/ACT AERS Inhale 2 puffs into the lungs daily. 1 Inhaler 5   Current Facility-Administered Medications on File Prior to Visit  Medication Dose Route Frequency Provider Last Rate Last Dose  . 0.9 %  sodium chloride infusion   Intravenous Continuous Ralene Muskrat, PA-C      . aspirin EC tablet 325 mg  325 mg Oral 60 min Pre-Op Ralene Muskrat, PA-C      . niMODipine (NIMOTOP) capsule 0-60 mg  0-60 mg Oral 60 min Pre-Op Ralene Muskrat, PA-C

## 2018-08-31 NOTE — Telephone Encounter (Signed)
Nicotine patch 14mg 

## 2018-08-31 NOTE — Telephone Encounter (Signed)
Pt can get help with cost of patches if we give him a Rx. Please advise. Thanks.

## 2018-08-31 NOTE — Telephone Encounter (Signed)
Called and spoke with the patient regarding Nicotine patches Advised the MR agreed with filling the Nicotine patch 14mg  Placed order today Nicotine patch 14mg  to [facility] [place] Patient verbalized understanding had no further concerns Nothing further needed.

## 2018-08-31 NOTE — Telephone Encounter (Signed)
Ok go ahead and do nicotine patch

## 2018-08-31 NOTE — Telephone Encounter (Signed)
Nicotine patches - this is OTC right?  AECOPD  Take doxycycline 100mg  po twice daily x 5 days; take after meals and avoid sunlight  Please take prednisone 40 mg x1 day, then 30 mg x1 day, then 20 mg x1 day, then 10 mg x1 day, and then 5 mg x1 day and stop   He should try stopping lisinopril - makes cough worse. He needs to talk to his PCP Saguier, Ramon DredgeEdward, PA-C   Thanks    SIGNATURE    Dr. Kalman ShanMurali Nastassja Witkop, M.D., F.C.C.P,  Pulmonary and Critical Care Medicine Staff Physician, [facility] Director - Interstitial Lung Disease  Program  Pulmonary Fibrosis [facility] [place], [place], [id number]  Pager: [phone], If no answer or between  15:00h - 7:00h: call [phone] Telephone: [phone]  1:44 PM 08/31/2018    Allergies  Allergen Reactions  . Symbicort [Budesonide-Formoterol Fumarate] Shortness Of Breath, Nausea And Vomiting and Other (See Comments)    heachache  . Dulera [Mometasone Furo-Formoterol Fum] Other (See Comments)    Headache   . Humira [Adalimumab] Nausea And Vomiting    Headaches

## 2018-08-31 NOTE — Telephone Encounter (Signed)
What dose on the nictoine patches? They come in 7mg , 14mg  and 21mg . Spoke with pt and he is says that the 14mg  patches have helped him in the past.  Pt is aware of MR's recommendations. Rxs for Prednisone and Doxy have been sent in.

## 2018-09-01 MED FILL — SPIRIVA RESPIMAT INHAL SPRY: 2.5 | 30 days supply | Qty: 4 | Fill #2

## 2018-09-02 NOTE — Telephone Encounter (Signed)
PA paperwork was faxed for pt's daliresp 08/28/2018.

## 2018-09-04 NOTE — Telephone Encounter (Signed)
Received a fax from  [employer] in regards to the PA that was submitted for the daliresp. The PA was denied coverage.  The reason for the denial stated that pt's med would only be covered if pt had a diagnosis of severe to very severe chronic obstructive pulmonary disease or your chronic obstructive pulmonary disease is associated with chronic bronchitis.   Called [employer] due to pt having a diagnosis of severe COPD and has had several COPD exacerbations and due to this, that is why he needs the daliresp. This PA was for a continuation of therapy due to the med helping with the exacerbations.  Spoke with Jonah in regards to the denial we received and stated that I was trying to figure out why it was denied due to pt meeting all the criteria. After telling Jonah all ICD codes she was needing for why the PA was initially denied, Jonah reran the PA and the PA has been approved through 09/05/2019 with a case ID of [id number].  Called pt's pharmacy and spoke with Pam stating this to her about the PA. Per Pam, pt has picked up the medication. Nothing further needed.

## 2018-09-04 NOTE — Telephone Encounter (Signed)
[facility] for [facility]

## 2018-09-18 ENCOUNTER — Other Ambulatory Visit: Payer: Self-pay | Admitting: Primary Care

## 2018-09-18 MED FILL — AZELASTINE HCL 137 MCG SPRY: 0.1 | 30 days supply | Qty: 30 | Fill #0

## 2018-09-18 MED FILL — DALIRESP 500 MCG TABLET: 500 | 30 days supply | Qty: 30 | Fill #1

## 2018-09-29 ENCOUNTER — Ambulatory Visit (INDEPENDENT_AMBULATORY_CARE_PROVIDER_SITE_OTHER)
Admission: RE | Admit: 2018-09-29 | Discharge: 2018-09-29 | Disposition: A | Discharge status: Home / Self Care | Payer: 59 | Source: Ambulatory Visit | Attending: Primary Care | Admitting: Primary Care

## 2018-09-29 ENCOUNTER — Telehealth: Payer: Self-pay | Admitting: Internal Medicine

## 2018-09-29 ENCOUNTER — Ambulatory Visit: Payer: 59 | Admitting: Primary Care

## 2018-09-29 ENCOUNTER — Encounter: Payer: Self-pay | Admitting: Primary Care

## 2018-09-29 VITALS — BP 130/90 | HR 90 | Temp 98.3°F | Ht 72.0 in | Wt 188.8 lb

## 2018-09-29 DIAGNOSIS — J479 Bronchiectasis, uncomplicated: Secondary | ICD-10-CM

## 2018-09-29 DIAGNOSIS — J441 Chronic obstructive pulmonary disease with (acute) exacerbation: Secondary | ICD-10-CM

## 2018-09-29 MED ORDER — PREDNISONE 10 MG PO TABS: ORAL_TABLET | ORAL | 0 refills | Status: DC

## 2018-09-29 MED ORDER — SODIUM CHLORIDE 3 % IN NEBU: INHALATION_SOLUTION | RESPIRATORY_TRACT | 1 refills | Status: AC

## 2018-09-29 MED FILL — SODIUM CHLORIDE 3 % NEBU: 3 | 30 days supply | Qty: 240 | Fill #0 | Status: TO

## 2018-09-29 MED FILL — AMLODIPINE BESYLATE 10 MG T: 10 | 30 days supply | Qty: 30 | Fill #1

## 2018-09-29 MED FILL — predniSONE 10 MG TABS: 10 | 8 days supply | Qty: 20 | Fill #0

## 2018-09-29 NOTE — Telephone Encounter (Signed)
Called and spoke with Patient. Derek Ahr, NP recommendations given.  Patient is scheduled to see Ames Dura, NP, at 2pm.  Nothing further at this time.

## 2018-09-29 NOTE — Telephone Encounter (Signed)
Spoke with patient. He believes he is having another COPD exacerbation. He has been having issues with increased SOB and a productive cough with white phlegm. Denied any fever or body aches. He has been scheduled to see MR on 10/07/18 at 430.   He wants to know if he can have an antibiotic and round of prednisone to last him until his appt next week. He wishes to [facility] in [place].   Tonya, please advise. Thanks!

## 2018-09-29 NOTE — Assessment & Plan Note (Addendum)
-   Adding hypertonic saline nebulizer twice daily, followed by flutter valve  - Advised Mucinex 1-2 tabs twice daily (max 4) - Checking CXR today

## 2018-09-29 NOTE — Telephone Encounter (Signed)
Patient will need appointment. thanks

## 2018-09-29 NOTE — Patient Instructions (Addendum)
  Recommendations: - Mucinex 1-2 tabs twice a day - Take morning Advair and Spiriva, then use hypertonic saline neb and then flutter valve   Rx: - Prednisone taper as prescribed (40,40,30,30,20,20,10,10) - Add hypertonic saline nebs twice daily for congestion  * Holding off on antibiotic at this time, monitor for fever or purulent sputum   Orders: - CXR today   Follow-up: - Next week with Dr. Marchelle Gearing as previously scheduled

## 2018-09-29 NOTE — Telephone Encounter (Signed)
Late Entry for [id number]    [email or web address]  Title: Blood Sample Collection in Subjects with Pulmonary Nodules or CT Suspicion of Lung  Cancer  Study Number: [id number]; Protocol: Version 5.0,  Date: 30JAN2018  Sponsor: [employer] [address] [place] [address]  Principal Investigator: Dr. Chilton Greathouse ; Sub Investigators: Dr. Kalman Shan, Dr. Max Fickle  Synopsis: This is multi-site, sample collection study.The study is to obtain de-identified, clinically characterized, whole blood specimens for use in assessing new biomarkers for the detection of neoplasms off the lung.  The study will sample blood (37mL) from approximately 2250 subject; 1000 will have CT suspicion of lung cancer which is ultimately diagnosed as lung cancer and approximately 1000 subjects will have pulmonary nodules greater than or equal to 4 mm .  Clinical Research Coordinator note : This visit for Subject Derek Conner with DOB: [birth year] on 12/18/2017 for the above protocol is Visit/Encounter # 6 month follow up and is for purpose of research . The consent for this encounter was IRB approved at the time of the visit. There  was NO documented changes in contact information (e.g. address, telephone, email).    As per required by the above mentioned protocol the subject was due for their 6 month follow up assessment of present baseline lung nodules. This subject's chart has been reviewed and data collected was for the purpose of the study. For additional information on the subject encounter please refer to the subjects paper source binder.   Signed by  T. Cherly Hensen BS, CCRC, CPhT Clinical Research Coordinator [place], [place] 3:26 AM 09/29/2018 - Late entry for [id number]

## 2018-09-29 NOTE — Progress Notes (Signed)
 @Patient  ID: Derek Conner, male    DOB: 01/29/1961, 58 y.o.   MRN: [id number]  Chief Complaint  Patient presents with  . Follow-up    cough with white foamy mucus, SOB with exertion, wheezing,    Referring provider: Marisue BrooklynSaguier, Edward, PA-C  HPI: 58 year old male, current everyday smoker. PMH COPD GOLD III, MAI, community acquired pneumonia, right pulmonary nodule. Patient of Dr. Marchelle Gearingamaswamy, last seen by Pulmonary NP on 03/25/18 for COPD exacerbation. Maintained on Advair, Spiriva and daliresp 500mg  daily. Trialed therapy vest but patient ended up declining.   09/29/2018 Patient presents today for an acute visit with complaints of shortness of breath on exertion, wheezing and cough with white foam. Called office and advised he needed to be seen. Symptoms started 4-5 days ago. Albuterol nebulizer doesn't seem to help much and only last an hour. States that he has the flutter valve but he doesn't like using it because it makes his symptoms worse. Declined smart therapy vest. He started taking Mucinex this week. Reports that 5 day course of prednisone is not as effective as taper. Denies fever, chills, N/V/D.    COPD exacerbations in the last year: 08/31/18- Telephone call; doxycyline and prednisone 07/31/18- OV; Serratia marcescens infection, Levaquin 750mg  x 1 week 07/13/18- OV; Serratia marcescens infection, Cipofloxacin 500mg  x 7 days and prednisone 07/06/18- OV; Augmentin and prednisone  05/19/18- Zpack and prednisone taper  04/29/18- Telephone call; Doxycyline and prednisone  09/19- PCP, Zpack 03/25/18-  OV; Doxycyline and prednisone 03/03/18- Telephone call; Doxycycline and prednisone  01/28/18- OV: Levaquin and prednisone  01/13/18- Telephone; Doxycycline and prednisone   Allergies  Allergen Reactions  . Symbicort [Budesonide-Formoterol Fumarate] Shortness Of Breath, Nausea And Vomiting and Other (See Comments)    heachache  . Dulera [Mometasone Furo-Formoterol Fum] Other (See  Comments)    Headache   . Humira [Adalimumab] Nausea And Vomiting    Headaches     Immunization History  Administered Date(s) Administered  . Tdap 08/29/2015    Past Medical History:  Diagnosis Date  . Asthma   . CAP (community acquired pneumonia) 01/19/2016  . Cerebral aneurysm   . Chronic bronchitis (HCC)   . COPD (chronic obstructive pulmonary disease) (HCC)   . Emphysema lung (HCC)   . Emphysema of lung (HCC)   . GERD (gastroesophageal reflux disease)   . Hepatitis A infection ~ 1980  . High cholesterol   . Hypertension   . Pneumonia 1960s  . Rheumatoid arthritis (HCC)    "hands mostly"  ( 01/19/2016)  . Situational depression 2011   "only when my mother died"  . Stroke [facility](HCC)    mini stroke when doing embolization, 2017, no deficits    Tobacco History: Social History   Tobacco Use  Smoking Status Current Every Day Smoker  . Packs/day: 0.50  . Years: 40.00  . Pack years: 20.00  . Types: Cigarettes  Smokeless Tobacco Never Used  Tobacco Comment   8-10 per day (07-06-2018)   Ready to quit: Not Answered Counseling given: Not Answered Comment: 8-10 per day (07-06-2018)   Outpatient Medications Prior to Visit  Medication Sig Dispense Refill  . acetaminophen (TYLENOL) 500 MG tablet Take 1,000 mg by mouth daily as needed for moderate pain or headache.    . albuterol (PROVENTIL HFA;VENTOLIN HFA) 108 (90 Base) MCG/ACT inhaler Inhale 2 puffs into the lungs every 4 (four) hours as needed for wheezing or shortness of breath. 1 Inhaler 5  . albuterol (PROVENTIL) (2.5 MG/3ML) 0.083% nebulizer  solution TAKE 3 MLS (2.5 MG TOTAL) BY NEBULIZATION EVERY 6 (SIX) HOURS AS NEEDED FOR WHEEZING OR SHORTNESS OF BREATH. 75 mL 12  . ALPRAZolam (XANAX) 0.5 MG tablet Take 1 tablet (0.5 mg total) by mouth 2 (two) times daily as needed for anxiety. 15 tablet 0  . amLODipine (NORVASC) 10 MG tablet TAKE 1 TABLET (10 MG) BY MOUTH DAILY. 90 tablet 3  . Azelastine HCl 137 MCG/SPRAY SOLN PLACE 2  SPRAYS INTO BOTH NOSTRILS 2 (TWO) TIMES DAILY. USE IN EACH NOSTRIL AS DIRECTED 30 mL 1  . b complex vitamins tablet Take 1 tablet by mouth daily.    . cloNIDine (CATAPRES) 0.2 MG tablet Take 1 tablet (0.2 mg total) by mouth 2 (two) times daily. 60 tablet 11  . famotidine (PEPCID) 20 MG tablet TAKE 1 TABLET (20 MG TOTAL) BY MOUTH AT BEDTIME. 90 tablet 1  . fenofibrate 54 MG tablet Take 1 tablet (54 mg total) by mouth daily. 30 tablet 11  . Fluticasone-Salmeterol (ADVAIR DISKUS) 500-50 MCG/DOSE AEPB Inhale 1 puff into the lungs 2 (two) times daily. 60 each 5  . lisinopril (PRINIVIL,ZESTRIL) 10 MG tablet TAKE 1 TABLET (10 MG) BY MOUTH DAILY. 90 tablet 3  . nicotine (NICODERM CQ - DOSED IN MG/24 HOURS) 14 mg/24hr patch Place 1 patch (14 mg total) onto the skin daily. 28 patch 0  . OXYGEN Inhale 2 L into the lungs at bedtime.    . pantoprazole (PROTONIX) 40 MG tablet TAKE 1 TABLET (40 MG TOTAL) BY MOUTH DAILY AT 12:00 NOON. 30 tablet 3  . roflumilast (DALIRESP) 500 MCG TABS tablet Take 1 tablet (500 mcg total) by mouth daily. 30 tablet 6  . Tiotropium Bromide Monohydrate (SPIRIVA RESPIMAT) 2.5 MCG/ACT AERS Inhale 2 puffs into the lungs daily. 1 Inhaler 5  . losartan (COZAAR) 25 MG tablet Take 1 tablet (25 mg total) by mouth daily. 30 tablet 3   Facility-Administered Medications Prior to Visit  Medication Dose Route Frequency Provider Last Rate Last Dose  . 0.9 %  sodium chloride infusion   Intravenous Continuous Ralene Muskrat, PA-C      . aspirin EC tablet 325 mg  325 mg Oral 60 min Pre-Op Ralene Muskrat, PA-C      . niMODipine (NIMOTOP) capsule 0-60 mg  0-60 mg Oral 60 min Pre-Op Ralene Muskrat, PA-C        Review of Systems  Review of Systems  Constitutional: Negative.   HENT: Negative.   Respiratory: Positive for cough, shortness of breath and wheezing.   Cardiovascular: Negative.     Physical Exam  BP 130/90 (BP Location: Right Arm, Cuff Size: Normal)   Pulse 90   Temp 98.3 F (36.8  C)   Ht 6' (1.829 m)   Wt 188 lb 12.8 oz (85.6 kg)   SpO2 96%   BMI 25.61 kg/m  Physical Exam Constitutional:      General: He is not in acute distress.    Appearance: Normal appearance.  HENT:     Head: Normocephalic and atraumatic.  Neck:     Musculoskeletal: Normal range of motion and neck supple.  Cardiovascular:     Rate and Rhythm: Normal rate and regular rhythm.  Pulmonary:     Effort: Pulmonary effort is normal.     Breath sounds: Wheezing and rhonchi present.  Musculoskeletal: Normal range of motion.  Skin:    General: Skin is warm and dry.  Neurological:     General: No focal deficit present.  Mental Status: He is alert and oriented to person, place, and time. Mental status is at baseline.  Psychiatric:        Mood and Affect: Mood normal.        Behavior: Behavior normal.        Thought Content: Thought content normal.        Judgment: Judgment normal.      Lab Results:  CBC    Component Value Date/Time   WBC 10.6 04/01/2018 1208   WBC 9.0 01/08/2018 0650   RBC 5.24 04/01/2018 1208   RBC 5.07 01/08/2018 0650   HGB 14.6 04/01/2018 1208   HCT 44.9 04/01/2018 1208   PLT 455 (H) 01/08/2018 0650   MCV 86 04/01/2018 1208   MCH 27.9 04/01/2018 1208   MCH 26.4 01/08/2018 0650   MCHC 32.5 04/01/2018 1208   MCHC 30.7 01/08/2018 0650   RDW 15.8 (H) 04/01/2018 1208   LYMPHSABS 2.7 04/01/2018 1208   MONOABS 1.0 10/29/2017 0929   EOSABS 0.1 04/01/2018 1208   BASOSABS 0.0 04/01/2018 1208    BMET    Component Value Date/Time   NA 137 04/08/2018 0721   K 4.6 04/08/2018 0721   CL 101 04/08/2018 0721   CO2 27 04/08/2018 0721   GLUCOSE 101 (H) 04/08/2018 0721   BUN 15 04/08/2018 0721   CREATININE 1.24 04/08/2018 0721   CREATININE 1.27 09/13/2016 0722   CALCIUM 10.0 04/08/2018 0721   GFRNONAA 60 (L) 01/08/2018 0650   GFRNONAA 63 09/13/2016 0722   GFRAA >60 01/08/2018 0650   GFRAA 73 09/13/2016 0722    BNP    Component Value Date/Time   BNP 17.9  05/16/2016 1523    ProBNP No results found for: PROBNP  Imaging: No results found.   Assessment & Plan:   Chronic obstructive pulmonary disease with acute exacerbation (HCC) - Mild exacerbation needs RX prednisone taper  - No signs of bacterial infection, holding off on abx d/t frequent use and potential to develop drug resistance  - Continue Advair, Spiriva and Daliresp   - Monitor for fever or purulent sputum  - FU in 1 week   Bronchiectasis without complication (HCC) - Adding hypertonic saline nebulizer twice daily, followed by flutter valve  - Advised Mucinex 1-2 tabs twice daily (max 4) - Checking CXR today    Glenford Bayley, NP 09/29/2018

## 2018-09-29 NOTE — Assessment & Plan Note (Signed)
-   Mild exacerbation needs RX prednisone taper  - No signs of bacterial infection, holding off on abx d/t frequent use and potential to develop drug resistance  - Continue Advair, Spiriva and Daliresp   - Monitor for fever or purulent sputum  - FU in 1 week

## 2018-09-30 ENCOUNTER — Telehealth: Payer: Self-pay | Admitting: Primary Care

## 2018-09-30 DIAGNOSIS — J181 Lobar pneumonia, unspecified organism: Principal | ICD-10-CM

## 2018-09-30 DIAGNOSIS — J189 Pneumonia, unspecified organism: Secondary | ICD-10-CM

## 2018-09-30 MED ORDER — LEVOFLOXACIN 750 MG PO TABS: 750.0000 mg | ORAL_TABLET | Freq: Every day | ORAL | 0 refills | Status: DC

## 2018-09-30 MED FILL — levoFLOXacin 750 MG TABS: 750 | 7 days supply | Qty: 7 | Fill #0

## 2018-09-30 NOTE — Telephone Encounter (Signed)
CXR did show new left upper lobe airspace disease consistent with pneumonia. Will send in RX for Levaquin. Needs follow up CXR in 3-4 weeks.

## 2018-09-30 NOTE — Telephone Encounter (Signed)
Called spoke with patient, discussed cxr results/recs as stated by [facility] NP Patient voiced his understanding Rx has already been sent by [facility] NP Xray order has been placed for 3 weeks from now  Nothing further needed; will sign off

## 2018-10-07 ENCOUNTER — Encounter: Payer: Self-pay | Admitting: Internal Medicine

## 2018-10-07 ENCOUNTER — Ambulatory Visit: Payer: 59 | Admitting: Internal Medicine

## 2018-10-07 VITALS — BP 138/90 | HR 86 | Ht 72.0 in | Wt 188.0 lb

## 2018-10-07 DIAGNOSIS — A488 Other specified bacterial diseases: Secondary | ICD-10-CM

## 2018-10-07 DIAGNOSIS — J181 Lobar pneumonia, unspecified organism: Secondary | ICD-10-CM | POA: Diagnosis not present

## 2018-10-07 DIAGNOSIS — J441 Chronic obstructive pulmonary disease with (acute) exacerbation: Secondary | ICD-10-CM

## 2018-10-07 DIAGNOSIS — G4734 Idiopathic sleep related nonobstructive alveolar hypoventilation: Secondary | ICD-10-CM

## 2018-10-07 DIAGNOSIS — F17200 Nicotine dependence, unspecified, uncomplicated: Secondary | ICD-10-CM

## 2018-10-07 DIAGNOSIS — J189 Pneumonia, unspecified organism: Secondary | ICD-10-CM

## 2018-10-07 DIAGNOSIS — J449 Chronic obstructive pulmonary disease, unspecified: Secondary | ICD-10-CM | POA: Diagnosis not present

## 2018-10-07 MED ORDER — FLUTICASONE-UMECLIDIN-VILANT 100-62.5-25 MCG/INH IN AEPB: 1.0000 | INHALATION_SPRAY | Freq: Every day | RESPIRATORY_TRACT | 0 refills | Status: AC

## 2018-10-07 MED FILL — ADVAIR 500/50 DISKUS: 500-50 | 30 days supply | Qty: 60 | Fill #3 | Status: TO

## 2018-10-07 MED FILL — VENTOLIN HFA 90 MCG INHALER: 108 (90 BAS | 17 days supply | Qty: 18 | Fill #2

## 2018-10-07 MED FILL — SPIRIVA RESPIMAT 2.5 MCG IN: 2.5 | 30 days supply | Qty: 4 | Fill #3 | Status: TO

## 2018-10-07 NOTE — Progress Notes (Signed)
OV 07/24/2016  Chief Complaint  Patient presents with  . Follow-up    Pt states overall his breathing is doing well. Pt states he has an occassional prod cough with clear to white mucus. Pt denies CP/tightness and f/c/s.     [name] with smoking, pulmonary nodules in September 2016, frequent COPD exacerbation and severe COPD based on 2000 1550  Admitted 05/16/2016 for COPD exacerbation. After that he followed up with my colleague and was doing well. Since then he's continued to do well. His COPD stable. He presents with his wife/girlfriend who is a Engineer, civil (consulting) and 60s at [facility] [employer]. He continues to refuse vaccines. He is taking triple inhaler therapy and also Roflumilast. He has lost 25 pounds due to dietary changes and it is intentional. He feels overall well. He continues to smoke a little bit and he knows the importance of quitting. Last pulmonary nodule was summer 2016 on CT chest. Chest x-rays this you have been clear.  11/15/2016 acute extended ov/Wert re: aecopd / active smoker Chief Complaint  Patient presents with  . Acute Visit    Pt c/o sinus pressure, HA, wheezing, increased SOB and cough for the past 4 days. Cough is prod with moderate amounts of green to yellow sputum. He is using his albuterol inhaler 3-4 x per day.   onset 11/12/16 flared on maint rx =  spiriva/daliresp/advair 500  plus on alb early p afternoon typically needs at least once daily    recently completed taper off pred / using saba now up to 4 x daily and noct but typically comfortable at rest p saba rx  Says can't take dulera or symbicort  Cough is worse at hs and in am and thinks this triggered the sob/ wheeze with lost of nasal congestion/obst symptoms  No obvious patterns in day to day or daytime variability or assoc mucus plugs or hemoptysis or cp or chest tightness, subjective wheeze or overt   hb symptoms. No unusual exp hx or h/o childhood pna/ asthma or knowledge of premature birth.   Also  denies any obvious fluctuation of symptoms with weather or environmental changes or other aggravating or alleviating factors except as outlined above     01/09/2017 2 week Follow Up Appointment Pt. Presents for follow up. He was seen 12/13/2015 for COPD exacerbation. He was treated with Doxycycline and a prednisone taper.He states his cough is better. He does still have secretions that are white to foamy.He no longer has purulent secretions. He is continuing to smoke. He states he is still short of breath.We discussed the shortness of breath and the correlation with smoking. We discussed the use of NicoDerm patches to assist with his attempt at smoking cessation. He states he has used these in the past. He cannot tolerate the 21 mg patch, so wants to use the 14 mg patches. I explained that I will prescribe Nicotrol inhaler for breakthrough if needed. We spent a long time discussing the need to quit smoking, as it is now affecting his ability to breathe. Patient denies fever, chest pain, orthopnea, or hemoptysis. He does complain of continued dyspnea on exertion. He is compliant with his Spiriva, Advair, and Daliresp. Last low-dose lung cancer screening CT was read as a Lung RADS 4 A : suspicious findings, either short term follow up in 3 months or alternatively .3 month Follow up CT is due to July 2018.    Test Results:  Low Dose Lung Cancer Screening Follow Up CT:11/22/2016  No pleural effusion. Moderate changes of centrilobular emphysema noted. Diffuse bronchial wall thickening identified. Chronic scarring and architectural distortion within the posterior right upper lobe is again identified and appears similar to previous exam. Multiple pulmonary nodular densities are again identified in both lungs. Within the left upper lobe there is an enlarging nodule which has an equivalent diameter of 7.8 mm,. On the previous exam this nodule was present with an equivalent diameter of 2.2 mm.   OV  02/10/2017  Chief Complaint  Patient presents with  . Follow-up    Pt c/o increase in SOB, prod cough with white mucus, chest tightness x 3 days. Pt denies f/c/s.    Follow-up  #Advanced COPD: He is here with his wife. I did do an FMLA form for his wife in the last few days. He tells me that he is in the midst of an exacerbation. Overall in 2018 he has not had any admissions. He and his wife believe that the strategy of coming to the office or calling during an exacerbation has prevented admissions. 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 He thinks  Working hard and exposure to carpets makes his copd flare up. He is allergic to breo and dulera and does not want to change his spiriva and advair as as a result. He ison daliresp and is working well. He DOES NOT think he is ready for disability. Says he loves work  #lung nodule - left side has growin in June 2018. Has fu CT February 21, 2017  #Smoking - stil does  OV 05/23/2017  Chief Complaint  Patient presents with  . Follow-up    Pt states that he has been doing good. Does still have some chest congestion and occ. cough with yellow mucus. Denies any SOB or CP.    Follow-up  Advanced COPD:Last saw him 05/08/2017 at the time of research nodule study. At that time he had COPD exacerbation. We'll give him antibodies and prednisone. Currently he is back to baseline. 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 He takes Advair and Spiriva and this works well for him. He is also on Daliresp. He still smokes and will not quit. He does not want a flu shot. In terms of his  Lung nodule: He is a repeat CT scan of the chest6 months from July 2018   OV 08/25/2017  Chief Complaint  Patient presents with  . Follow-up    feels like cough and SOB are better,has some chest congestion,surgical clearance for r hip 09/23/17 then replace the L hip later on   Follow-up advanced stage 3 COPD: He feels he  is in the beginning stages of a COPD exacerbation.  For the last few days his chest is congested and is wheezing a little bit more.  He is interested in antibiotics and prednisone.  Otherwise he is compliant with Spiriva, Advair, Daliresp to prevent exacerbations.  Recently saw my nurse practitioner Sarah and was started on nocturnal oxygen.  He wants his listed on his medication list.  COPD CAT score is 21  Follow-up smoking: He continues to smoke but is trying to quit  New issue of preoperative lung clearance.  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  He is going to have right hip surgery first.  This can be done by Dr. August Saucer.  The surgery will take place at [facility] and not an outpatient surgical center.  He  tells me that over 8 years ago he had rheumatoid arthritis and was on Humira.  Since then the rheumatoid arthritis has not bothered him.  He is off Humira because of side effects.  We did a preoperative clearance assessment as documented below.  Currently is using crutches.  He is on FMLA/disability.  He is in severe pain.    OV 12/18/2017  Chief Complaint  Patient presents with  . Follow-up    Pt had hip surgery 4/24.  Pt states he has been doing well since last visit and denies any complaints or concerns.   Follow-up several issues  Severe COPD: He is on triple inhaler therapy with Spiriva and Advair.  He does not like Breo.  He is stable.  COPD CAT score is 22.  He tells me that after right hip surgery in January 2019 and left hip surgery few to several weeks ago he is significantly better even in terms of respiratory health.  He is doing hip rehab with physical therapy.  After that he will be open to attending more pulmonary rehab.  Multiple lung nodules: In January 2019 he had low-dose CT scan for lung cancer screening.  Some of these nodules were large.  It has a waxing and waning quality.  Radiologist did not recommend annual low-dose CT scan for lung  cancer screening because of high false positivity.  It was felt the nodules were not malignant in orientation.  At this point in time is not having any fever or weight loss of sweats or chills or hemoptysis.  : He is cut down his cigarettes to 5 cigarettes a day.  He wants to quit.  He says Wellbutrin is helped him somewhat.  Now that his smoking is less he wants to start Wellbutrin again.  OV 07/13/2018  Subjective:  Patient ID: Derek Conner, male , DOB: 06-24-1961 , age 38 y.o. , MRN: [id number] , ADDRESS: [address] [place] [address]   07/13/2018 -   Chief Complaint  Patient presents with  . Follow-up    Pt states that his chest feels very tight, has some phlegm     HPI Derek Conner 58 y.o. -follow-up advanced COPD with recurrent exacerbations despite Spiriva, Advair, Daliresp.  Has ongoing smoking and has multiple lung nodules waxing and waning quality  Presents for routine follow-up.  Since last seeing me several months ago he is now status post bilateral hip surgery and is doing well.  He has had multiple exacerbations.  Most recently the day after Thanksgiving July 02, 2018 he had COPD exacerbation.  Could not get out of bed.  July 06, 2018 saw a Publishing rights manager.  Was subjected to sputum culture [subsequently has grown Serratia].  Sent home on Augmentin [Serratia is resistant to this] and a day prednisone course.  He has 1 more day of this left.  He says he is somewhat better but not fully back to baseline.  Prior COPD exacerbation he is recovered much faster.  He is frustrated by this.  COPD CAT score is 27.  Review of his lab results show EKG that I personally visualized #2018.  QTc interval was normal.  Allergy review shows he is not allergic to quinolones.  Given frequent exacerbations we discussed about switching to Trelegy but he said this caused some cough when he tried a sample.  He is willing to try converting his Spiriva HandiHaler to Respimat.   For now he wants to continue his Advair.  He does  not want to change this.  He reports an allergy to Symbicort.     09/29/2018  58 year old male, current everyday smoker. PMH COPD GOLD III, MAI, community acquired pneumonia, right pulmonary nodule. Patient of Dr. Marchelle Gearing, last seen by Pulmonary NP on 03/25/18 for COPD exacerbation. Maintained on Advair, Spiriva and daliresp  daily. Trialed therapy vest but patient ended up declining.    Patient presents today for an acute visit with complaints of shortness of breath on exertion, wheezing and cough with white foam. Called office and advised he needed to be seen. Symptoms started 4-5 days ago. Albuterol nebulizer doesn't seem to help much and only last an hour. States that he has the flutter valve but he doesn't like using it because it makes his symptoms worse. Declined smart therapy vest. He started taking Mucinex this week. Reports that 5 day course of prednisone is not as effective as taper. Denies fever, chills, N/V/D.    COPD exacerbations in the last year: 08/31/18- Telephone call; doxycyline and prednisone 07/31/18- OV; Serratia marcescens infection, Levaquin  x 1 week 07/13/18- OV; Serratia marcescens infection, Cipofloxacin  x 7 days and prednisone 07/06/18- OV; Augmentin and prednisone  05/19/18- Zpack and prednisone taper  04/29/18- Telephone call; Doxycyline and prednisone  09/19- PCP, Zpack 03/25/18-  OV; Doxycyline and prednisone 03/03/18- Telephone call; Doxycycline and prednisone  01/28/18- OV: Levaquin and prednisone  01/13/18- Telephone; Doxycycline and prednisone   OV 10/07/2018  Subjective:  Patient ID: Derek Conner, male , DOB: [birth year] , age 58 y.o. , MRN: [id number] , ADDRESS: [address] [place] [address]   10/07/2018 -   Chief Complaint  Patient presents with  . Follow-up    Pt states since last OV where he had pna, he is finally beginning to get back to normal. Pt still has  complaints of cough with clear to white phlegm, SOB with exertion but will occ have SOB doing nothing, and has had chest tightness.   Follow-up advanced COPD with nocturnal oxygen use but no chronic steroids.  On Spiriva and Advair and Daliresp and 3% saline nebs snce feb 2020.  Has waxing and waning multiple nodules.  MAI therapy.  Multiple recurrent exacerbations including December 2019 with Serratia.  QuantiFERON gold negative in 2015.  Has associated rheumatoid arthritis currently not on treatment.  HPI Derek Conner 58 y.o. -presents for follow-up.  Last seen in December 2019 by myself at that time had Serratia related exacerbation and colonization.  He saw a nurse practitioner approximately a week ago for another exacerbation.  This time the chest x-ray showed left upper lobe pneumonia.  He got steroids and antibiotics.  His last day of antibiotics today/yesterday. He tells me that since serratia flare up - he has new low baseline even though he feels current flare up has resolved. Still smokes - says chantix gave him bad side effects. Last PFT was in 2018. HE wants to apply for disability. And wife now works for [employer] and he wil have to move his health care there.      CAT COPD Symptom & Quality of Life Score (GSK trademark) 0 is no burden. 5 is highest burden 1/21/192 12/18/2017  07/13/2018  10/07/2018   Never Cough -> Cough all the time  No phlegm in chest -> Chest is full of phlegm  No chest tightness -> Chest feels very tight  No dyspnea for 1 flight stairs/hill ->  Very dyspneic for 1 flight of stairs  5 4 5   No limitations for ADL at home -> Very limited with ADL at home  3 4 5   Confident leaving home -> Not at all confident leaving home  1 2 3   Sleep soundly -> Do not sleep soundly because of lung condition  5 3 4   Lots of Energy -> No energy at all  3 4 5   TOTAL Score (max 40)  21 22 27  36         ROS - per HPI     has a past medical history of  Asthma, CAP (community acquired pneumonia) (01/19/2016), Cerebral aneurysm, Chronic bronchitis (HCC), COPD (chronic obstructive pulmonary disease) (HCC), Emphysema lung (HCC), Emphysema of lung (HCC), GERD (gastroesophageal reflux disease), Hepatitis A infection (~ 1980), High cholesterol, Hypertension, Pneumonia (1960s), Rheumatoid arthritis (HCC), Situational depression (2011), and Stroke (HCC).   reports that he has been smoking cigarettes. He has a 80.00 pack-year smoking history. He has never used smokeless tobacco.  Past Surgical History:  Procedure Laterality Date  . APPENDECTOMY  1980's  . CHEST TUBE INSERTION Left 1990's   "lung collapsed"  . CYSTECTOMY Left 1960's   "wrist"  . FOOT FRACTURE SURGERY Left ~ 2005   "it was crushed"  . IR 3D INDEPENDENT WKST  01/08/2018  . IR ANGIO INTRA EXTRACRAN SEL COM CAROTID INNOMINATE BILAT MOD SED  01/08/2018  . IR ANGIO VERTEBRAL SEL VERTEBRAL BILAT MOD SED  01/08/2018  . IR GENERIC HISTORICAL  03/27/2016   IR ANGIOGRAM FOLLOW UP STUDY 03/27/2016 Julieanne Cotton, MD [facility]  . IR GENERIC HISTORICAL  03/27/2016   IR ANGIOGRAM FOLLOW UP STUDY 03/27/2016 Julieanne Cotton, MD [facility]  . IR GENERIC HISTORICAL  03/27/2016   IR ANGIO INTRA EXTRACRAN SEL COM CAROTID INNOMINATE UNI L MOD SED 03/27/2016 Julieanne Cotton, MD [facility]  . IR GENERIC HISTORICAL  03/27/2016   IR ANGIOGRAM FOLLOW UP STUDY 03/27/2016 Julieanne Cotton, MD [facility]  . IR GENERIC HISTORICAL  03/27/2016   IR ANGIO VERTEBRAL SEL SUBCLAVIAN INNOMINATE UNI R MOD SED 03/27/2016 Julieanne Cotton, MD [facility]  . IR GENERIC HISTORICAL  03/27/2016   IR 3D INDEPENDENT WKST 03/27/2016 Julieanne Cotton, MD [facility]  . IR GENERIC HISTORICAL  03/27/2016   IR ANGIOGRAM FOLLOW UP STUDY 03/27/2016 Julieanne Cotton, MD [facility]  . IR GENERIC HISTORICAL  03/27/2016   IR NEURO EACH ADD'L AFTER BASIC UNI RIGHT (MS) 03/27/2016 Julieanne Cotton, MD [facility]  . IR  GENERIC HISTORICAL  03/27/2016   IR ANGIOGRAM FOLLOW UP STUDY 03/27/2016 Julieanne Cotton, MD [facility]  . IR GENERIC HISTORICAL  03/27/2016   IR TRANSCATH/EMBOLIZ 03/27/2016 Julieanne Cotton, MD [facility]  . IR GENERIC HISTORICAL  03/27/2016   IR ANGIO INTRA EXTRACRAN SEL INTERNAL CAROTID UNI R MOD SED 03/27/2016 Julieanne Cotton, MD [facility]  . IR GENERIC HISTORICAL  03/27/2016   IR ANGIOGRAM FOLLOW UP STUDY 03/27/2016 Julieanne Cotton, MD [facility]  . IR GENERIC HISTORICAL  04/12/2016   IR RADIOLOGIST EVAL & MGMT 04/12/2016 [facility]  . RADIOLOGY WITH ANESTHESIA N/A 02/29/2016   Procedure: EMBOLIZATION     (RADIOLOGY WITH ANESTHESIA);  Surgeon: Julieanne Cotton, MD;  Location: [facility] OR;  Service: Radiology;  Laterality: N/A;  . RADIOLOGY WITH ANESTHESIA N/A 03/27/2016   Procedure: Embolization;  Surgeon: Julieanne Cotton, MD;  Location: [facility] OR;  Service: Radiology;  Laterality: N/A;  . TOTAL HIP ARTHROPLASTY Right 09/23/2017  . TOTAL  HIP ARTHROPLASTY Right 09/23/2017   Procedure: RIGHT TOTAL HIP ARTHROPLASTY ANTERIOR APPROACH;  Surgeon: Cammy Copa, MD;  Location: [facility] OR;  Service: Orthopedics;  Laterality: Right;  . TOTAL HIP ARTHROPLASTY Left 11/26/2017   Procedure: LEFT TOTAL HIP ARTHROPLASTY ANTERIOR APPROACH;  Surgeon: Cammy Copa, MD;  Location: [facility] OR;  Service: Orthopedics;  Laterality: Left;  Marland Kitchen VIDEO BRONCHOSCOPY Bilateral 12/06/2013   Procedure: VIDEO BRONCHOSCOPY WITHOUT FLUORO;  Surgeon: Alyson Reedy, MD;  Location: [facility] ENDOSCOPY;  Service: Cardiopulmonary;  Laterality: Bilateral;    Allergies  Allergen Reactions  . Symbicort [Budesonide-Formoterol Fumarate] Shortness Of Breath, Nausea And Vomiting and Other (See Comments)    heachache  . Dulera [Mometasone Furo-Formoterol Fum] Other (See Comments)    Headache   . Humira [Adalimumab] Nausea And Vomiting    Headaches     Immunization History  Administered Date(s) Administered  . Tdap 08/29/2015     Family History  Problem Relation Age of Onset  . Cancer Mother   . Arthritis Mother   . Stroke Father   . Hypertension Father   . Healthy Daughter   . Healthy Son   . Colon cancer Neg Hx      Current Outpatient Medications:  .  acetaminophen (TYLENOL) 500 MG tablet, Take 1,000 mg by mouth daily as needed for moderate pain or headache., Disp: , Rfl:  .  albuterol (PROVENTIL HFA;VENTOLIN HFA) 108 (90 Base) MCG/ACT inhaler, Inhale 2 puffs into the lungs every 4 (four) hours as needed for wheezing or shortness of breath., Disp: 1 Inhaler, Rfl: 5 .  albuterol (PROVENTIL) (2.5 MG/3ML) 0.083% nebulizer solution, TAKE 3 MLS (2.5 MG TOTAL) BY NEBULIZATION EVERY 6 (SIX) HOURS AS NEEDED FOR WHEEZING OR SHORTNESS OF BREATH., Disp: 75 mL, Rfl: 12 .  ALPRAZolam (XANAX) 0.5 MG tablet, Take 1 tablet (0.5 mg total) by mouth 2 (two) times daily as needed for anxiety., Disp: 15 tablet, Rfl: 0 .  amLODipine (NORVASC) 10 MG tablet, TAKE 1 TABLET (10 MG) BY MOUTH DAILY., Disp: 90 tablet, Rfl: 3 .  Azelastine HCl 137 MCG/SPRAY SOLN, PLACE 2 SPRAYS INTO BOTH NOSTRILS 2 (TWO) TIMES DAILY. USE IN EACH NOSTRIL AS DIRECTED, Disp: 30 mL, Rfl: 1 .  b complex vitamins tablet, Take 1 tablet by mouth daily., Disp: , Rfl:  .  cloNIDine (CATAPRES) 0.2 MG tablet, Take 1 tablet (0.2 mg total) by mouth 2 (two) times daily., Disp: 60 tablet, Rfl: 11 .  famotidine (PEPCID) 20 MG tablet, TAKE 1 TABLET (20 MG TOTAL) BY MOUTH AT BEDTIME., Disp: 90 tablet, Rfl: 1 .  fenofibrate 54 MG tablet, Take 1 tablet (54 mg total) by mouth daily., Disp: 30 tablet, Rfl: 11 .  Fluticasone-Salmeterol (ADVAIR DISKUS) 500-50 MCG/DOSE AEPB, Inhale 1 puff into the lungs 2 (two) times daily., Disp: 60 each, Rfl: 5 .  nicotine (NICODERM CQ - DOSED IN MG/24 HOURS) 14 mg/24hr patch, Place 1 patch (14 mg total) onto the skin daily., Disp: 28 patch, Rfl: 0 .  OXYGEN, Inhale 2 L into the lungs at bedtime., Disp: , Rfl:  .  pantoprazole (PROTONIX) 40 MG  tablet, TAKE 1 TABLET (40 MG TOTAL) BY MOUTH DAILY AT 12:00 NOON., Disp: 30 tablet, Rfl: 3 .  roflumilast (DALIRESP) 500 MCG TABS tablet, Take 1 tablet (500 mcg total) by mouth daily., Disp: 30 tablet, Rfl: 6 .  sodium chloride HYPERTONIC 3 % nebulizer solution, Use twice daily as needed for congestion, Disp: 400 mL, Rfl: 1 .  Tiotropium Bromide  Monohydrate (SPIRIVA RESPIMAT) 2.5 MCG/ACT AERS, Inhale 2 puffs into the lungs daily., Disp: 1 Inhaler, Rfl: 5 .  Fluticasone-Umeclidin-Vilant (TRELEGY ELLIPTA) 100-62.5-25 MCG/INH AEPB, Inhale 1 puff into the lungs daily., Disp: 1 each, Rfl: 0 No current facility-administered medications for this visit.   Facility-Administered Medications Ordered in Other Visits:  .  0.9 %  sodium chloride infusion, , Intravenous, Continuous, Ralene Muskrat, PA-C .  aspirin EC tablet 325 mg, 325 mg, Oral, 60 min Pre-Op, Turpin, Pamela, PA-C .  niMODipine (NIMOTOP) capsule 0-60 mg, 0-60 mg, Oral, 60 min Pre-Op, Ralene Muskrat, PA-C      Objective:   Vitals:   10/07/18 1655  BP: 138/90  Pulse: 86  SpO2: 97%  Weight: 188 lb (85.3 kg)  Height: 6' (1.829 m)    Estimated body mass index is 25.5 kg/m as calculated from the following:   Height as of this encounter: 6' (1.829 m).   Weight as of this encounter: 188 lb (85.3 kg).  @  [employer]   10/07/18 1655  Weight: 188 lb (85.3 kg)     Physical Exam  General Appearance:    Alert, cooperative, no distress, appears stated age - yes , Deconditioned looking - no , OBESE  - no, Sitting on Wheelchair -  no  Head:    Normocephalic, without obvious abnormality, atraumatic  Eyes:    PERRL, conjunctiva/corneas clear,  Ears:    Normal TM's and external ear canals, both ears  Nose:   Nares normal, septum midline, mucosa normal, no drainage    or sinus tenderness. OXYGEN ON  - no . Patient is @ ra   Throat:   Lips, mucosa, and tongue normal; teeth and gums normal. Cyanosis on lips - no  Neck:    Supple, symmetrical, trachea midline, no adenopathy;    thyroid:  no enlargement/tenderness/nodules; no carotid   bruit or JVD  Back:     Symmetric, no curvature, ROM normal, no CVA tenderness  Lungs:     Distress - no , Wheeze no, Barrell Chest - no, Purse lip breathing - no, Crackles - no. DISTANT +   Chest Wall:    No tenderness or deformity.    Heart:    Regular rate and rhythm, S1 and S2 normal, no rub   or gallop, Murmur - no  Breast Exam:    NOT DONE  Abdomen:     Soft, non-tender, bowel sounds active all four quadrants,    no masses, no organomegaly. Visceral obesity - no  Genitalia:   NOT DONE  Rectal:   NOT DONE  Extremities:   Extremities - normal, Has Cane - no, Clubbing - no, Edema - no  Pulses:   2+ and symmetric all extremities  Skin:   Stigmata of Connective Tissue Disease - no  Lymph nodes:   Cervical, supraclavicular, and axillary nodes normal  Psychiatric:  Neurologic:   Pleasant - yes, Anxious - no, Flat affect - no  CAm-ICU - neg, Alert and Oriented x 3 - yes, Moves all 4s - yes, Speech - normal, Cognition - intact           Assessment:       ICD-10-CM   1. COPD, severe (HCC) J44.9 CBC w/Diff    Resp Allergy Profile Regn2DC DE MD [place]    IgE    QuantiFERON-TB Gold Plus  2. Community acquired pneumonia of left upper lobe of lung (HCC) J18.1 DG Chest 2 View    Respiratory or  Resp and Sputum Culture     MYCOBACTERIA, CULTURE, WITH FLUOROCHROME SMEAR  3. Serratia marcescens infection A48.8 DG Chest 2 View    Respiratory or Resp and Sputum Culture     MYCOBACTERIA, CULTURE, WITH FLUOROCHROME SMEAR  4. COPD, frequent exacerbations (HCC) J44.1 CBC w/Diff    Resp Allergy Profile Regn2DC DE MD [place]    IgE    QuantiFERON-TB Gold Plus  5. Tobacco use disorder F17.200   6. Nocturnal hypoxemia G47.34 CBC w/Diff    Resp Allergy Profile Regn2DC DE MD [place] [facility]    IgE    Pulmonary function test    QuantiFERON-TB Gold Plus       Plan:     Patient Instructions       ICD-10-CM   1. COPD, severe (HCC) J44.9 CBC w/Diff    Resp Allergy Profile Regn2DC DE MD [place] [facility]    IgE    QuantiFERON-TB Gold Plus  2. Community acquired pneumonia of left upper lobe of lung (HCC) J18.1 DG Chest 2 View    Respiratory or Resp and Sputum Culture     MYCOBACTERIA, CULTURE, WITH FLUOROCHROME SMEAR  3. Serratia marcescens infection A48.8 DG Chest 2 View    Respiratory or Resp and Sputum Culture     MYCOBACTERIA, CULTURE, WITH FLUOROCHROME SMEAR  4. COPD, frequent exacerbations (HCC) J44.1 CBC w/Diff    Resp Allergy Profile Regn2DC DE MD [place] [facility]    IgE    QuantiFERON-TB Gold Plus  5. Tobacco use disorder F17.200   6. Nocturnal hypoxemia G47.34 CBC w/Diff    Resp Allergy Profile Regn2DC DE MD [place] [facility]    IgE    Pulmonary function test    QuantiFERON-TB Gold Plus     COPD, severe (HCC) COPD, frequent exacerbations (HCC) Nocturnal hypoxemia  - next week do cbc with diff, blood allergy panel and IgE  - if abnormal could qualify for asthma biologic if you have asthma/copd overlap - continue o2 at night  - try trelegy again for 2-4 weeks instead of spiriva and advair - continue 3% saline nebs as before  - continue nasal spray as before - continue daliresp - do spirometry pre- and post BD in 5-6 weeks   Community acquired pneumonia of left upper lobe of lung (HCC) - feb 2020 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 This includes one or more of the following documented above: discussion of test results, diagnostic or treatment recommendations, prognosis, risks and benefits of management options, instructions, education, compliance or risk-factor reduction    SIGNATURE     Dr. Kalman ShanMurali Nitya Cauthon, M.D., F.C.C.P,  Pulmonary and Critical Care Medicine Staff Physician, [facility] Director - Interstitial Lung Disease  Program  Pulmonary Fibrosis [facility] [place], [place], [id number]  Pager: [phone], If no answer or between  15:00h - 7:00h: call [phone] Telephone: [phone]  7:02 PM 10/07/2018

## 2018-10-07 NOTE — Patient Instructions (Addendum)
ICD-10-CM   1. 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 Community acquired pneumonia of left upper lobe of lung (HCC) J18.1 DG Chest 2 View    Respiratory or Resp and Sputum Culture     MYCOBACTERIA, CULTURE, WITH FLUOROCHROME SMEAR  3. Serratia marcescens infection A48.8 DG Chest 2 View    Respiratory or Resp and Sputum Culture     MYCOBACTERIA, CULTURE, WITH FLUOROCHROME SMEAR  4. COPD, frequent exacerbations (HCC) J44.1 CBC w/Diff    Resp Allergy Profile Regn2DC DE MD [place] [facility]    IgE    QuantiFERON-TB Gold Plus  5. Tobacco use disorder F17.200   6. Nocturnal hypoxemia G47.34 CBC w/Diff    Resp Allergy Profile Regn2DC DE MD [place] [facility]    IgE    Pulmonary function test    QuantiFERON-TB Gold Plus     COPD, severe (HCC) COPD, frequent exacerbations (HCC) Nocturnal hypoxemia  - next week do cbc with diff, blood allergy panel and IgE  - if abnormal could qualify for asthma biologic if you have asthma/copd overlap - continue o2 at night  - try trelegy again for 2-4 weeks instead of spiriva and advair - continue 3% saline nebs as before  - continue nasal spray as before - continue daliresp - do spirometry pre- and post BD in 5-6 weeks   Community acquired pneumonia of left upper lobe of lung (HCC) - feb 2020 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

## 2018-10-08 ENCOUNTER — Telehealth: Payer: Self-pay | Admitting: Medical

## 2018-10-08 NOTE — Telephone Encounter (Signed)
Saw pulmonologist notes today. Pt mentioned he wants to quit smoking and thought wellbutrin helped in past. Will you call him and see if lung doctor rx'd med. If not double check with him and I can send in prescription.

## 2018-10-09 ENCOUNTER — Telehealth: Payer: Self-pay

## 2018-10-09 NOTE — Telephone Encounter (Signed)
[employer] Nodule study  Today I spoke with Derek Conner and he confirmed being willing to provide blood sample per requirements of the [employer] Nodule study on 14APR2020. Subject was thanked for his time .  Cam

## 2018-10-19 ENCOUNTER — Ambulatory Visit: Payer: 59 | Admitting: Rheumatology

## 2018-10-19 MED FILL — DALIRESP 500 MCG TABLET: 500 | 30 days supply | Qty: 30 | Fill #3

## 2018-10-19 MED FILL — cloNIDine HCL 0.2 MG TABS: 0.2 | 30 days supply | Qty: 60 | Fill #1 | Status: TO

## 2018-10-19 MED FILL — AZELASTINE HCL 137 MCG SPRY: 0.1 | 30 days supply | Qty: 30 | Fill #1 | Status: TO

## 2018-10-30 MED FILL — FENOFIBRATE 54 MG TABLET: 54 | 30 days supply | Qty: 30 | Fill #1 | Status: TO

## 2018-10-30 MED FILL — VENTOLIN HFA 90 MCG INHALER: 108 (90 BAS | 17 days supply | Qty: 18 | Fill #3 | Status: TO

## 2018-10-30 MED FILL — DALIRESP 500 MCG TABLET: 500 | 30 days supply | Qty: 30 | Fill #2 | Status: TO

## 2018-10-30 MED FILL — AMLODIPINE BESYLATE 10 MG T: 10 | 30 days supply | Qty: 30 | Fill #2 | Status: TO

## 2018-11-06 ENCOUNTER — Telehealth: Payer: Self-pay | Admitting: Internal Medicine

## 2018-11-06 DIAGNOSIS — J441 Chronic obstructive pulmonary disease with (acute) exacerbation: Secondary | ICD-10-CM

## 2018-11-06 MED ORDER — ALBUTEROL SULFATE (2.5 MG/3ML) 0.083% IN NEBU: INHALATION_SOLUTION | RESPIRATORY_TRACT | 12 refills | Status: AC

## 2018-11-06 MED ORDER — TIOTROPIUM BROMIDE MONOHYDRATE 2.5 MCG/ACT IN AERS: 2.0000 | INHALATION_SPRAY | Freq: Every day | RESPIRATORY_TRACT | 5 refills | Status: DC

## 2018-11-06 MED ORDER — ALBUTEROL SULFATE HFA 108 (90 BASE) MCG/ACT IN AERS: 2.0000 | INHALATION_SPRAY | RESPIRATORY_TRACT | 5 refills | Status: DC | PRN

## 2018-11-06 MED ORDER — AZELASTINE HCL 137 MCG/SPRAY NA SOLN: 1.0000 | Freq: Two times a day (BID) | NASAL | 3 refills | Status: DC | PRN

## 2018-11-06 MED ORDER — ROFLUMILAST 500 MCG PO TABS: 500.0000 ug | ORAL_TABLET | Freq: Every day | ORAL | 6 refills | Status: DC

## 2018-11-06 MED ORDER — FLUTICASONE-SALMETEROL 500-50 MCG/DOSE IN AEPB: 1.0000 | INHALATION_SPRAY | Freq: Two times a day (BID) | RESPIRATORY_TRACT | 5 refills | Status: AC

## 2018-11-06 NOTE — Telephone Encounter (Signed)
Verified with patient rx sent nothing further needed.

## 2018-11-12 ENCOUNTER — Telehealth: Payer: Self-pay | Admitting: *Deleted

## 2018-11-12 NOTE — Telephone Encounter (Signed)
Received request for Medical Records from [place] [employer]; forwarded to Medical Records via email/scan/SLS  

## 2018-11-16 ENCOUNTER — Telehealth: Payer: Self-pay | Admitting: Internal Medicine

## 2018-11-16 MED ORDER — AZELASTINE HCL 137 MCG/SPRAY NA SOLN: 1.0000 | Freq: Two times a day (BID) | NASAL | 3 refills | Status: DC | PRN

## 2018-11-16 NOTE — Telephone Encounter (Signed)
Called pt to verify preferred pharmacy. Let him know refill for Azelastine nasal spray will be sent to [facility] as requested. Refill sent. Nothing further needed at this time.

## 2018-11-17 ENCOUNTER — Ambulatory Visit: Payer: 59 | Admitting: Internal Medicine

## 2018-12-07 ENCOUNTER — Telehealth: Payer: Self-pay | Admitting: *Deleted

## 2018-12-07 NOTE — Telephone Encounter (Signed)
Pt last seen by PCP 04/10/18 and is due for follow up of HTN now. Left detailed message on pt's voicemail to call for more information about virtual visits and to schedule appointment soon.

## 2018-12-10 ENCOUNTER — Other Ambulatory Visit: Payer: Self-pay | Admitting: Internal Medicine

## 2018-12-25 NOTE — Telephone Encounter (Signed)
Pt has not scheduled follow up w/PCP at this time. Sent [employer].

## 2019-01-08 NOTE — Telephone Encounter (Signed)
Pt has not read mychart message. Mailed letter. 

## 2019-02-01 ENCOUNTER — Telehealth (HOSPITAL_COMMUNITY): Payer: Self-pay

## 2019-02-01 NOTE — Telephone Encounter (Signed)
Spoke to pt and he won't be seeing Dr. Estanislado Pandy for now. His new insurance won't cover any [facility] docs and so he will find a new doc. He is waiting for his disability also and will handle everything from there. AW

## 2019-02-10 ENCOUNTER — Other Ambulatory Visit: Payer: Self-pay | Admitting: Internal Medicine

## 2019-02-10 DIAGNOSIS — J441 Chronic obstructive pulmonary disease with (acute) exacerbation: Secondary | ICD-10-CM

## 2019-03-04 ENCOUNTER — Other Ambulatory Visit: Payer: Self-pay | Admitting: Internal Medicine

## 2019-03-15 ENCOUNTER — Other Ambulatory Visit: Payer: Self-pay | Admitting: Internal Medicine

## 2019-04-21 ENCOUNTER — Other Ambulatory Visit: Payer: Self-pay | Admitting: Internal Medicine

## 2019-04-21 DIAGNOSIS — J441 Chronic obstructive pulmonary disease with (acute) exacerbation: Secondary | ICD-10-CM

## 2019-05-31 ENCOUNTER — Other Ambulatory Visit: Payer: Self-pay | Admitting: Internal Medicine

## 2019-05-31 DIAGNOSIS — J441 Chronic obstructive pulmonary disease with (acute) exacerbation: Secondary | ICD-10-CM

## 2019-07-01 ENCOUNTER — Telehealth: Payer: Self-pay | Admitting: Pulmonary Disease

## 2019-07-01 NOTE — Telephone Encounter (Signed)
07/01/2019 2043  Triage,   Pt is due for follow up in our office. Pt is also due for a Spirometry and DLCO (ordered in March/2020 by MR). Please schedule pt for Arlyce Harman with DLCO and also a follow up with MR or APP sometime over next 4-6 weeks. If breathing test cannot be coordinated with appt then schedule appt and breathing test separately.   Wyn Quaker FNP

## 2019-07-05 NOTE — Telephone Encounter (Signed)
LMTCB

## 2019-07-06 NOTE — Telephone Encounter (Signed)
LMTCB

## 2019-07-08 NOTE — Telephone Encounter (Signed)
Called patient both at his number on file as well as his wife's number. Left messages for them to call back.   Will go ahead and mail letter to patient for him to contact our office.

## 2019-07-08 NOTE — Telephone Encounter (Signed)
Please see previous message.  Can we please attempt to contact the patient 1 more time.  If you are unable to reach the patient please let send a formal letter requesting the patient contact our office to schedule a follow-up visit.  Wyn Quaker, FNP

## 2020-10-09 ENCOUNTER — Other Ambulatory Visit (HOSPITAL_COMMUNITY): Payer: Self-pay | Admitting: Nephrology

## 2020-10-09 DIAGNOSIS — N281 Cyst of kidney, acquired: Secondary | ICD-10-CM

## 2020-10-17 ENCOUNTER — Ambulatory Visit (HOSPITAL_COMMUNITY): Payer: 59

## 2020-10-23 ENCOUNTER — Ambulatory Visit (HOSPITAL_COMMUNITY): Payer: Commercial Managed Care - PPO

## 2020-10-23 ENCOUNTER — Encounter (HOSPITAL_COMMUNITY): Payer: Self-pay

## 2021-01-22 ENCOUNTER — Telehealth: Payer: Self-pay | Admitting: Medical

## 2021-01-22 NOTE — Telephone Encounter (Signed)
Opened to review. Took myself off as his pcp.

## 2022-06-19 ENCOUNTER — Telehealth: Payer: Self-pay | Admitting: Gastroenterology

## 2022-06-19 NOTE — Telephone Encounter (Signed)
Hi Dr. Lavon Paganini,  Supervising Provider  Patient called requesting a transfer of care back to [facility], previously seen by Dr. Christella Hartigan. Records were also obtained by Dr. Camila Li office, looks like he had a colonoscopy in 2022 and is not happy as a patient there. Will be sending records for you to review. Please advise on scheduling. Thank you

## 2022-06-20 NOTE — Telephone Encounter (Signed)
Called patient to advise he said "ok thanks"

## 2023-03-13 ENCOUNTER — Telehealth (HOSPITAL_COMMUNITY): Payer: Self-pay

## 2023-03-13 NOTE — Telephone Encounter (Signed)
Received a referral from [facility] to get pt back in to see Dr. Corliss Skains. Called to schedule consult. Pt has radiation therapy coming up and will call back when he is ready to schedule. AB
